# Patient Record
Sex: Male | Born: 1950
Health system: Southern US, Community
[De-identification: ages and names within clinical notes are randomized; demographics above are authoritative.]

## PROBLEM LIST (undated history)

## (undated) DIAGNOSIS — J189 Pneumonia, unspecified organism: Secondary | ICD-10-CM

## (undated) DIAGNOSIS — Z950 Presence of cardiac pacemaker: Secondary | ICD-10-CM

## (undated) DIAGNOSIS — I442 Atrioventricular block, complete: Secondary | ICD-10-CM

## (undated) DIAGNOSIS — Z95810 Presence of automatic (implantable) cardiac defibrillator: Secondary | ICD-10-CM

## (undated) DIAGNOSIS — I2510 Atherosclerotic heart disease of native coronary artery without angina pectoris: Secondary | ICD-10-CM

## (undated) DIAGNOSIS — I10 Essential (primary) hypertension: Secondary | ICD-10-CM

## (undated) DIAGNOSIS — I5022 Chronic systolic (congestive) heart failure: Secondary | ICD-10-CM

## (undated) DIAGNOSIS — E162 Hypoglycemia, unspecified: Secondary | ICD-10-CM

## (undated) DIAGNOSIS — I255 Ischemic cardiomyopathy: Secondary | ICD-10-CM

## (undated) DIAGNOSIS — J45909 Unspecified asthma, uncomplicated: Secondary | ICD-10-CM

## (undated) DIAGNOSIS — H3500 Unspecified background retinopathy: Secondary | ICD-10-CM

## (undated) DIAGNOSIS — E119 Type 2 diabetes mellitus without complications: Secondary | ICD-10-CM

## (undated) HISTORY — PX: KNEE SURGERY: SHX244

## (undated) HISTORY — PX: CATARACT EXTRACTION W/ INTRAOCULAR LENS  IMPLANT, BILATERAL: SHX1307

## (undated) HISTORY — DX: Chronic systolic (congestive) heart failure: I50.22

## (undated) HISTORY — DX: Ischemic cardiomyopathy: I25.5

## (undated) HISTORY — DX: Atrioventricular block, complete: I44.2

## (undated) HISTORY — DX: Atherosclerotic heart disease of native coronary artery without angina pectoris: I25.10

## (undated) HISTORY — DX: Hypoglycemia, unspecified: E16.2

## (undated) HISTORY — DX: Essential (primary) hypertension: I10

## (undated) HISTORY — PX: TONSILLECTOMY: SUR1361

## (undated) HISTORY — DX: Unspecified background retinopathy: H35.00

## (undated) HISTORY — DX: Type 2 diabetes mellitus without complications: E11.9

## (undated) HISTORY — DX: Presence of cardiac pacemaker: Z95.0

## (undated) HISTORY — PX: CARDIAC CATHETERIZATION: SHX172

---

## 1999-07-01 HISTORY — PX: CORONARY ARTERY BYPASS GRAFT: SHX141

## 2010-05-30 HISTORY — PX: INSERT / REPLACE / REMOVE PACEMAKER: SUR710

## 2014-12-07 DIAGNOSIS — G20 Parkinson's disease: Secondary | ICD-10-CM | POA: Insufficient documentation

## 2014-12-07 DIAGNOSIS — J4521 Mild intermittent asthma with (acute) exacerbation: Secondary | ICD-10-CM | POA: Insufficient documentation

## 2015-08-06 DIAGNOSIS — G20 Parkinson's disease: Secondary | ICD-10-CM | POA: Diagnosis not present

## 2015-08-06 DIAGNOSIS — R55 Syncope and collapse: Secondary | ICD-10-CM | POA: Diagnosis not present

## 2015-08-31 DIAGNOSIS — I495 Sick sinus syndrome: Secondary | ICD-10-CM | POA: Diagnosis not present

## 2015-09-03 DIAGNOSIS — E119 Type 2 diabetes mellitus without complications: Secondary | ICD-10-CM | POA: Diagnosis not present

## 2015-09-03 DIAGNOSIS — Z79899 Other long term (current) drug therapy: Secondary | ICD-10-CM | POA: Diagnosis not present

## 2015-09-03 DIAGNOSIS — I10 Essential (primary) hypertension: Secondary | ICD-10-CM | POA: Diagnosis not present

## 2015-10-30 DIAGNOSIS — E103493 Type 1 diabetes mellitus with severe nonproliferative diabetic retinopathy without macular edema, bilateral: Secondary | ICD-10-CM | POA: Diagnosis not present

## 2015-10-30 DIAGNOSIS — H40023 Open angle with borderline findings, high risk, bilateral: Secondary | ICD-10-CM | POA: Diagnosis not present

## 2015-10-30 DIAGNOSIS — H04123 Dry eye syndrome of bilateral lacrimal glands: Secondary | ICD-10-CM | POA: Diagnosis not present

## 2015-11-01 DIAGNOSIS — R55 Syncope and collapse: Secondary | ICD-10-CM | POA: Diagnosis not present

## 2015-11-01 DIAGNOSIS — G20 Parkinson's disease: Secondary | ICD-10-CM | POA: Diagnosis not present

## 2015-11-12 DIAGNOSIS — A09 Infectious gastroenteritis and colitis, unspecified: Secondary | ICD-10-CM | POA: Diagnosis not present

## 2015-11-12 DIAGNOSIS — R197 Diarrhea, unspecified: Secondary | ICD-10-CM | POA: Diagnosis not present

## 2015-12-06 DIAGNOSIS — I2510 Atherosclerotic heart disease of native coronary artery without angina pectoris: Secondary | ICD-10-CM | POA: Diagnosis not present

## 2015-12-06 DIAGNOSIS — E669 Obesity, unspecified: Secondary | ICD-10-CM | POA: Insufficient documentation

## 2015-12-06 DIAGNOSIS — Z79899 Other long term (current) drug therapy: Secondary | ICD-10-CM | POA: Insufficient documentation

## 2015-12-06 DIAGNOSIS — Z7982 Long term (current) use of aspirin: Secondary | ICD-10-CM | POA: Diagnosis not present

## 2015-12-06 DIAGNOSIS — I495 Sick sinus syndrome: Secondary | ICD-10-CM | POA: Diagnosis not present

## 2015-12-24 DIAGNOSIS — Z125 Encounter for screening for malignant neoplasm of prostate: Secondary | ICD-10-CM | POA: Diagnosis not present

## 2015-12-24 DIAGNOSIS — E7800 Pure hypercholesterolemia, unspecified: Secondary | ICD-10-CM | POA: Diagnosis not present

## 2015-12-24 DIAGNOSIS — I10 Essential (primary) hypertension: Secondary | ICD-10-CM | POA: Diagnosis not present

## 2015-12-24 DIAGNOSIS — E119 Type 2 diabetes mellitus without complications: Secondary | ICD-10-CM | POA: Diagnosis not present

## 2015-12-25 DIAGNOSIS — I10 Essential (primary) hypertension: Secondary | ICD-10-CM | POA: Diagnosis not present

## 2015-12-25 DIAGNOSIS — Z125 Encounter for screening for malignant neoplasm of prostate: Secondary | ICD-10-CM | POA: Diagnosis not present

## 2015-12-25 DIAGNOSIS — E119 Type 2 diabetes mellitus without complications: Secondary | ICD-10-CM | POA: Diagnosis not present

## 2015-12-25 DIAGNOSIS — E7800 Pure hypercholesterolemia, unspecified: Secondary | ICD-10-CM | POA: Diagnosis not present

## 2016-01-21 DIAGNOSIS — J029 Acute pharyngitis, unspecified: Secondary | ICD-10-CM | POA: Diagnosis not present

## 2016-01-21 DIAGNOSIS — J3489 Other specified disorders of nose and nasal sinuses: Secondary | ICD-10-CM | POA: Diagnosis not present

## 2016-01-21 DIAGNOSIS — R51 Headache: Secondary | ICD-10-CM | POA: Diagnosis not present

## 2016-01-21 DIAGNOSIS — J018 Other acute sinusitis: Secondary | ICD-10-CM | POA: Diagnosis not present

## 2016-02-04 DIAGNOSIS — R55 Syncope and collapse: Secondary | ICD-10-CM | POA: Diagnosis not present

## 2016-02-04 DIAGNOSIS — G20 Parkinson's disease: Secondary | ICD-10-CM | POA: Diagnosis not present

## 2016-02-18 DIAGNOSIS — E103493 Type 1 diabetes mellitus with severe nonproliferative diabetic retinopathy without macular edema, bilateral: Secondary | ICD-10-CM | POA: Diagnosis not present

## 2016-02-18 DIAGNOSIS — H40023 Open angle with borderline findings, high risk, bilateral: Secondary | ICD-10-CM | POA: Diagnosis not present

## 2016-03-07 DIAGNOSIS — I495 Sick sinus syndrome: Secondary | ICD-10-CM | POA: Diagnosis not present

## 2016-04-04 DIAGNOSIS — E119 Type 2 diabetes mellitus without complications: Secondary | ICD-10-CM | POA: Diagnosis not present

## 2016-04-04 DIAGNOSIS — G20 Parkinson's disease: Secondary | ICD-10-CM | POA: Diagnosis not present

## 2016-04-04 DIAGNOSIS — J45909 Unspecified asthma, uncomplicated: Secondary | ICD-10-CM | POA: Diagnosis not present

## 2016-04-04 DIAGNOSIS — I10 Essential (primary) hypertension: Secondary | ICD-10-CM | POA: Diagnosis not present

## 2016-04-04 DIAGNOSIS — I2510 Atherosclerotic heart disease of native coronary artery without angina pectoris: Secondary | ICD-10-CM | POA: Diagnosis not present

## 2016-04-21 DIAGNOSIS — R062 Wheezing: Secondary | ICD-10-CM | POA: Diagnosis not present

## 2016-04-21 DIAGNOSIS — R05 Cough: Secondary | ICD-10-CM | POA: Diagnosis not present

## 2016-05-05 DIAGNOSIS — R079 Chest pain, unspecified: Secondary | ICD-10-CM | POA: Diagnosis not present

## 2016-05-15 DIAGNOSIS — R079 Chest pain, unspecified: Secondary | ICD-10-CM | POA: Diagnosis not present

## 2016-05-15 DIAGNOSIS — R0989 Other specified symptoms and signs involving the circulatory and respiratory systems: Secondary | ICD-10-CM | POA: Diagnosis not present

## 2016-05-15 DIAGNOSIS — Z950 Presence of cardiac pacemaker: Secondary | ICD-10-CM | POA: Diagnosis not present

## 2016-05-20 DIAGNOSIS — E103413 Type 1 diabetes mellitus with severe nonproliferative diabetic retinopathy with macular edema, bilateral: Secondary | ICD-10-CM | POA: Diagnosis not present

## 2016-06-06 DIAGNOSIS — Z950 Presence of cardiac pacemaker: Secondary | ICD-10-CM | POA: Diagnosis not present

## 2016-06-06 DIAGNOSIS — I442 Atrioventricular block, complete: Secondary | ICD-10-CM | POA: Diagnosis not present

## 2016-06-06 DIAGNOSIS — Z951 Presence of aortocoronary bypass graft: Secondary | ICD-10-CM | POA: Diagnosis not present

## 2016-06-06 DIAGNOSIS — Z7982 Long term (current) use of aspirin: Secondary | ICD-10-CM | POA: Diagnosis not present

## 2016-06-06 DIAGNOSIS — R9430 Abnormal result of cardiovascular function study, unspecified: Secondary | ICD-10-CM | POA: Diagnosis not present

## 2016-08-01 DIAGNOSIS — E119 Type 2 diabetes mellitus without complications: Secondary | ICD-10-CM | POA: Diagnosis not present

## 2016-08-01 DIAGNOSIS — J45909 Unspecified asthma, uncomplicated: Secondary | ICD-10-CM | POA: Diagnosis not present

## 2016-08-01 DIAGNOSIS — I10 Essential (primary) hypertension: Secondary | ICD-10-CM | POA: Diagnosis not present

## 2016-08-01 DIAGNOSIS — G20 Parkinson's disease: Secondary | ICD-10-CM | POA: Diagnosis not present

## 2016-08-01 DIAGNOSIS — I2510 Atherosclerotic heart disease of native coronary artery without angina pectoris: Secondary | ICD-10-CM | POA: Diagnosis not present

## 2016-08-01 DIAGNOSIS — I5022 Chronic systolic (congestive) heart failure: Secondary | ICD-10-CM | POA: Diagnosis not present

## 2016-08-01 DIAGNOSIS — Z6831 Body mass index (BMI) 31.0-31.9, adult: Secondary | ICD-10-CM | POA: Diagnosis not present

## 2016-08-15 ENCOUNTER — Encounter: Payer: Self-pay | Admitting: Cardiology

## 2016-08-26 ENCOUNTER — Ambulatory Visit: Payer: Self-pay | Admitting: Cardiology

## 2016-09-08 DIAGNOSIS — G20 Parkinson's disease: Secondary | ICD-10-CM | POA: Diagnosis not present

## 2016-09-08 DIAGNOSIS — G250 Essential tremor: Secondary | ICD-10-CM | POA: Diagnosis not present

## 2016-09-09 DIAGNOSIS — I442 Atrioventricular block, complete: Secondary | ICD-10-CM | POA: Diagnosis not present

## 2016-09-12 ENCOUNTER — Ambulatory Visit (INDEPENDENT_AMBULATORY_CARE_PROVIDER_SITE_OTHER): Payer: Medicare Other | Admitting: Cardiovascular Disease

## 2016-09-12 ENCOUNTER — Encounter: Payer: Self-pay | Admitting: Cardiovascular Disease

## 2016-09-12 ENCOUNTER — Encounter (INDEPENDENT_AMBULATORY_CARE_PROVIDER_SITE_OTHER): Payer: Self-pay

## 2016-09-12 VITALS — BP 120/72 | HR 77 | Ht 68.5 in | Wt 209.0 lb

## 2016-09-12 DIAGNOSIS — Z951 Presence of aortocoronary bypass graft: Secondary | ICD-10-CM | POA: Diagnosis not present

## 2016-09-12 DIAGNOSIS — I2510 Atherosclerotic heart disease of native coronary artery without angina pectoris: Secondary | ICD-10-CM

## 2016-09-12 DIAGNOSIS — E782 Mixed hyperlipidemia: Secondary | ICD-10-CM | POA: Diagnosis not present

## 2016-09-12 HISTORY — DX: Atherosclerotic heart disease of native coronary artery without angina pectoris: I25.10

## 2016-09-12 MED ORDER — ATORVASTATIN CALCIUM 40 MG PO TABS: 40.0000 mg | ORAL_TABLET | Freq: Every day | ORAL | 3 refills | Status: DC

## 2016-09-12 NOTE — Progress Notes (Signed)
Cardiology Office Note   Date:  09/12/2016   ID:  Jeffrey Parsons, DOB Feb 12, 1951, MRN [id number]  PCP:  No PCP Per Patient  Cardiologist:   Kristeen Miss, MD   Chief Complaint  Patient presents with  . Coronary Artery Disease   Problem lis 1. CAD - CABG atg 41 at [facility] ( 2002)  2. Diabetes 3. Pacer  4. Hyperlipidemia  5. Parkinsons disease 6. Asthma    History of Present Illness: Jeffrey Parsons is a 66 y.o. male who presents for further evaluation of his CAD CABG in 2001 at [place]  - age 35   Pacer in 2011 Recently moved to [place].  estabilshing care here   No angina .  Operated a [employer] for 40 years.  Now retired.  Exercises regularly     Past Medical History:  Diagnosis Date  . Hypertension   . Hypoglycemia   . Retinopathy   . Type 2 diabetes mellitus (HCC)     Past Surgical History:  Procedure Laterality Date  . 5 WAY HEART BYPASS  2001  . KNEE SURGERY Left    AS A CHILD  . PACEMAKER IMPLANT  05/2010     Current Outpatient Prescriptions  Medication Sig Dispense Refill  . albuterol (PROAIR HFA) 108 (90 Base) MCG/ACT inhaler Inhale 2 puffs into the lungs every 6 (six) hours as needed for wheezing or shortness of breath.    [name] aspirin 325 MG tablet Take 325 mg by mouth daily.    . carbidopa-levodopa (SINEMET CR) 50-200 MG tablet Take 1 tablet by mouth at bedtime.    . carbidopa-levodopa (SINEMET IR) 25-100 MG tablet Take 2 tablets by mouth 3 (three) times daily.    . insulin aspart (NOVOLOG FLEXPEN) 100 UNIT/ML FlexPen Inject into the skin 3 (three) times daily with meals.    . insulin glargine (LANTUS) 100 UNIT/ML injection Inject 45 Units into the skin at bedtime.    . Insulin Pen Needle (PEN NEEDLES 5/16") 30G X 8 MM MISC by Does not apply route.    [name] losartan-hydrochlorothiazide (HYZAAR) 100-12.5 MG tablet Take 1 tablet by mouth every other day.     . metoprolol (LOPRESSOR) 50 MG tablet Take 50 mg by mouth 2 (two) times daily.    .  Multiple Vitamin (MULTIVITAMIN) tablet Take 1 tablet by mouth daily.    . vitamin B-12 (CYANOCOBALAMIN) 1000 MCG tablet Take 1,000 mcg by mouth daily.     No current facility-administered medications for this visit.     PAD Screen 09/12/2016  Previous PAD dx? No  Previous surgical procedure? No  Pain with walking? No  Feet/toe relief with dangling? No  Painful, non-healing ulcers? No  Extremities discolored? No      Allergies:   Augmentin [amoxicillin-pot clavulanate] and Sulfa antibiotics    Social History:  The patient  reports that he has never smoked. He has never used smokeless tobacco. He reports that he does not drink alcohol or use drugs.   Family History:  The patient's family history includes Bronchitis in his mother; Coronary artery disease in his father; Heart disease in his father; Hypertension in his brother and sister.    ROS:  Please see the history of present illness.    Review of Systems: Constitutional:  denies fever, chills, diaphoresis, appetite change and fatigue.  HEENT: denies photophobia, eye pain, redness, hearing loss, ear pain, congestion, sore throat, rhinorrhea, sneezing, neck pain, neck stiffness and tinnitus.  Respiratory: denies SOB, DOE, cough,  chest tightness, and wheezing.  Cardiovascular: denies chest pain, palpitations and leg swelling.  Gastrointestinal: denies nausea, vomiting, abdominal pain, diarrhea, constipation, blood in stool.  Genitourinary: denies dysuria, urgency, frequency, hematuria, flank pain and difficulty urinating.  Musculoskeletal: denies  myalgias, back pain, joint swelling, arthralgias and gait problem.   Skin: denies pallor, rash and wound.  Neurological: denies dizziness, seizures, syncope, weakness, light-headedness, numbness and headaches.   Hematological: denies adenopathy, easy bruising, personal or family bleeding history.  Psychiatric/ Behavioral: denies suicidal ideation, mood changes, confusion, nervousness,  sleep disturbance and agitation.       All other systems are reviewed and negative.    PHYSICAL EXAM: VS:  BP 120/72 (BP Location: Left Arm, Patient Position: Sitting, Cuff Size: Normal)   Pulse 77   Ht 5' 8.5" (1.74 m)   Wt 209 lb (94.8 kg)   SpO2 93%   BMI 31.32 kg/m  , BMI Body mass index is 31.32 kg/m. GEN: Well nourished, well developed, in no acute distress  HEENT: normal  Neck: no JVD, carotid bruits, or masses Cardiac: RRR; no murmurs, rubs, or gallops,no edema  Respiratory:  clear to auscultation bilaterally, normal work of breathing GI: soft, nontender, nondistended, + BS MS: no deformity or atrophy  Skin: warm and dry, no rash Neuro:  Strength and sensation are intact Psych: normal   EKG:  EKG is ordered today. The ekg ordered today demonstrates AV sequential pacing at 77 bpm.   Recent Labs: No results found for requested labs within last 8760 hours.    Lipid Panel No results found for: CHOL, TRIG, HDL, CHOLHDL, VLDL, LDLCALC, LDLDIRECT    Wt Readings from Last 3 Encounters:  09/12/16 209 lb (94.8 kg)      Other studies Reviewed: Additional studies/ records that were reviewed today include: . Review of the above records demonstrates:    ASSESSMENT AND PLAN:  1.  1. Coronary artery disease: The patient has a history of coronary artery disease and is status post coronary artery bypass grafting in 2002. He is done very well. He's not had any episodes of chest pain or shortness of breath.  He had an echocardiogram recently and was not told of any specific abdomen allergies but told that his left ventricular contractility pattern was consistent with ventricle pacing.  We'll continue the same medications for now.  2. Hyperlipidemia: The patient is been on simvastatin. His LDL was 117. We'll discontinue the simvastatin and start him on atorvastatin 40 mg a day. We'll check fasting labs in 3 months.  3. Essential hypertension: Continue current start HCTZ  and metoprolol. We had some discussion about starting Toprol-XL but I do not think that there is any advantage to that at this time. He has a large supply of the Toprol tartrate at present.  I'll see him in 6 months.    Kristeen MissPhilip Sesilia Poucher, MD  09/12/2016 4:14 PM    [facility] [address], [place], [place]  [address] Phone: [phone]([phone]; Fax: [phone]

## 2016-09-12 NOTE — Patient Instructions (Signed)
Medication Instructions:  STOP Simvastatin (Zocor) START Atorvastatin (Lipitor) 40 mg once daily   Labwork: Your physician recommends that you return for lab work in: 3 months  You will need to FAST for this appointment - nothing to eat or drink after midnight the night before except water.   Testing/Procedures: None Ordered   Follow-Up: Your physician wants you to follow-up in: 6 months with Dr. Elease HashimotoNahser.  You will receive a reminder letter in the mail two months in advance. If you don't receive a letter, please call our office to schedule the follow-up appointment.   If you need a refill on your cardiac medications before your next appointment, please call your pharmacy.   Thank you for choosing [facility]! Eligha BridegroomMichelle Alicya Bena, RN [phone]

## 2016-11-04 DIAGNOSIS — I10 Essential (primary) hypertension: Secondary | ICD-10-CM | POA: Diagnosis not present

## 2016-11-04 DIAGNOSIS — E118 Type 2 diabetes mellitus with unspecified complications: Secondary | ICD-10-CM | POA: Diagnosis not present

## 2016-11-04 DIAGNOSIS — G20 Parkinson's disease: Secondary | ICD-10-CM | POA: Diagnosis not present

## 2016-11-04 DIAGNOSIS — E782 Mixed hyperlipidemia: Secondary | ICD-10-CM | POA: Diagnosis not present

## 2016-11-17 DIAGNOSIS — G20 Parkinson's disease: Secondary | ICD-10-CM | POA: Diagnosis not present

## 2016-12-04 DIAGNOSIS — I495 Sick sinus syndrome: Secondary | ICD-10-CM | POA: Diagnosis not present

## 2016-12-04 DIAGNOSIS — Z95810 Presence of automatic (implantable) cardiac defibrillator: Secondary | ICD-10-CM | POA: Insufficient documentation

## 2016-12-04 DIAGNOSIS — Z950 Presence of cardiac pacemaker: Secondary | ICD-10-CM | POA: Diagnosis not present

## 2016-12-04 DIAGNOSIS — Z79899 Other long term (current) drug therapy: Secondary | ICD-10-CM | POA: Diagnosis not present

## 2016-12-04 DIAGNOSIS — Z7982 Long term (current) use of aspirin: Secondary | ICD-10-CM | POA: Diagnosis not present

## 2016-12-04 DIAGNOSIS — Z882 Allergy status to sulfonamides status: Secondary | ICD-10-CM | POA: Insufficient documentation

## 2016-12-04 DIAGNOSIS — I10 Essential (primary) hypertension: Secondary | ICD-10-CM | POA: Diagnosis not present

## 2016-12-04 HISTORY — DX: Allergy status to sulfonamides: Z88.2

## 2016-12-16 ENCOUNTER — Other Ambulatory Visit: Payer: Medicare Other | Admitting: *Deleted

## 2016-12-16 DIAGNOSIS — E782 Mixed hyperlipidemia: Secondary | ICD-10-CM | POA: Diagnosis not present

## 2016-12-16 DIAGNOSIS — I2510 Atherosclerotic heart disease of native coronary artery without angina pectoris: Secondary | ICD-10-CM

## 2016-12-16 DIAGNOSIS — Z951 Presence of aortocoronary bypass graft: Secondary | ICD-10-CM | POA: Diagnosis not present

## 2016-12-16 LAB — LIPID PANEL
Chol/HDL Ratio: 2.4 ratio (ref 0.0–5.0)
Cholesterol, Total: 115 mg/dL (ref 100–199)
HDL: 48 mg/dL (ref >39)
LDL CALC: 52 mg/dL (ref 0–99)
Triglycerides: 74 mg/dL (ref 0–149)
VLDL CHOLESTEROL CAL: 15 mg/dL (ref 5–40)

## 2016-12-16 LAB — COMPREHENSIVE METABOLIC PANEL
ALBUMIN: 4.1 g/dL (ref 3.6–4.8)
ALK PHOS: 128 IU/L — AB (ref 39–117)
ALT: 14 IU/L (ref 0–44)
AST: 20 IU/L (ref 0–40)
Albumin/Globulin Ratio: 1.7 (ref 1.2–2.2)
BILIRUBIN TOTAL: 0.8 mg/dL (ref 0.0–1.2)
BUN / CREAT RATIO: 24 (ref 10–24)
BUN: 20 mg/dL (ref 8–27)
CO2: 23 mmol/L (ref 20–29)
CREATININE: 0.83 mg/dL (ref 0.76–1.27)
Calcium: 8.9 mg/dL (ref 8.6–10.2)
Chloride: 99 mmol/L (ref 96–106)
GFR calc non Af Amer: 92 mL/min/{1.73_m2} (ref >59)
GFR, EST AFRICAN AMERICAN: 106 mL/min/{1.73_m2} (ref >59)
GLOBULIN, TOTAL: 2.4 g/dL (ref 1.5–4.5)
GLUCOSE: 140 mg/dL — AB (ref 65–99)
Potassium: 4.4 mmol/L (ref 3.5–5.2)
SODIUM: 138 mmol/L (ref 134–144)
TOTAL PROTEIN: 6.5 g/dL (ref 6.0–8.5)

## 2017-03-06 DIAGNOSIS — I495 Sick sinus syndrome: Secondary | ICD-10-CM | POA: Diagnosis not present

## 2017-04-23 DIAGNOSIS — J4520 Mild intermittent asthma, uncomplicated: Secondary | ICD-10-CM | POA: Diagnosis not present

## 2017-04-23 DIAGNOSIS — E119 Type 2 diabetes mellitus without complications: Secondary | ICD-10-CM | POA: Diagnosis not present

## 2017-04-23 DIAGNOSIS — G20 Parkinson's disease: Secondary | ICD-10-CM | POA: Diagnosis not present

## 2017-04-23 DIAGNOSIS — E782 Mixed hyperlipidemia: Secondary | ICD-10-CM | POA: Diagnosis not present

## 2017-04-23 DIAGNOSIS — I2510 Atherosclerotic heart disease of native coronary artery without angina pectoris: Secondary | ICD-10-CM | POA: Diagnosis not present

## 2017-04-28 DIAGNOSIS — E782 Mixed hyperlipidemia: Secondary | ICD-10-CM | POA: Diagnosis not present

## 2017-04-28 DIAGNOSIS — E119 Type 2 diabetes mellitus without complications: Secondary | ICD-10-CM | POA: Diagnosis not present

## 2017-05-07 ENCOUNTER — Encounter: Payer: Self-pay | Admitting: Cardiovascular Disease

## 2017-05-07 ENCOUNTER — Ambulatory Visit (INDEPENDENT_AMBULATORY_CARE_PROVIDER_SITE_OTHER): Payer: Medicare Other | Admitting: Cardiovascular Disease

## 2017-05-07 VITALS — BP 116/52 | HR 86 | Ht 68.5 in | Wt 209.4 lb

## 2017-05-07 DIAGNOSIS — E782 Mixed hyperlipidemia: Secondary | ICD-10-CM | POA: Diagnosis not present

## 2017-05-07 DIAGNOSIS — Z950 Presence of cardiac pacemaker: Secondary | ICD-10-CM | POA: Diagnosis not present

## 2017-05-07 DIAGNOSIS — I2510 Atherosclerotic heart disease of native coronary artery without angina pectoris: Secondary | ICD-10-CM | POA: Diagnosis not present

## 2017-05-07 MED ORDER — ASPIRIN EC 81 MG PO TBEC: 81.0000 mg | DELAYED_RELEASE_TABLET | Freq: Every day | ORAL | Status: DC

## 2017-05-07 NOTE — Progress Notes (Signed)
Cardiology Office Note   Date:  05/07/2017   ID:  Jeffrey DarnerRussell Corrales, DOB 06/03/1951, MRN [id number]  PCP:  Abigail MiyamotoPerry, [name], MD  Cardiologist:   Kristeen MissPhilip Robyne Matar, MD   Chief Complaint  Patient presents with  . Coronary Artery Disease   Problem lis 1. CAD - [facility]g 5049 at [facility] ( [facility])  2. Diabetes 3. Pacer  4. Hyperlipidemia  5. Parkinsons disease 6. Asthma    History of Present Illness: Jeffrey Parsons is a 66 y.o. male who presents for further evaluation of his CAD CABG in 2001 at [facility]  - age 66   Pacer in 2011 Recently moved to [place].  estabilshing care here   No angina .  Operated a [employer] for 40 years.  Now retired.  Exercises regularly   Nov. 82018:  Doing well.   No Cp or dyspea,  No cough or cold symptoms Goes to  the gym 5 days a week  On his own, he  Decreased the atorvastatin to 20   Past Medical History:  Diagnosis Date  . Hypertension   . Hypoglycemia   . Retinopathy   . Type 2 diabetes mellitus (HCC)     Past Surgical History:  Procedure Laterality Date  . 5 WAY HEART BYPASS  2001  . KNEE SURGERY Left    AS A CHILD  . PACEMAKER IMPLANT  05/2010     Current Outpatient Medications  Medication Sig Dispense Refill  . albuterol (PROAIR HFA) 108 (90 Base) MCG/ACT inhaler Inhale 2 puffs into the lungs every 6 (six) hours as needed for wheezing or shortness of breath.    [name]. aspirin 325 MG tablet Take 325 mg by mouth daily.    [name]. atorvastatin (LIPITOR) 40 MG tablet Take 1 tablet (40 mg total) by mouth daily. 90 tablet 3  . carbidopa-levodopa (SINEMET CR) 50-200 MG tablet Take 1 tablet by mouth at bedtime.    . carbidopa-levodopa (SINEMET IR) 25-100 MG tablet Take 2 tablets by mouth 3 (three) times daily.    . insulin aspart (NOVOLOG FLEXPEN) 100 UNIT/ML FlexPen Inject into the skin 3 (three) times daily with meals.    . insulin glargine (LANTUS) 100 UNIT/ML injection Inject 45 Units into the skin at bedtime.    . Insulin Pen  Needle (PEN NEEDLES 5/16") 30G X 8 MM MISC by Does not apply route.    [name]. losartan-hydrochlorothiazide (HYZAAR) 100-12.5 MG tablet Take 1 tablet by mouth every other day.     . metoprolol (LOPRESSOR) 50 MG tablet Take 50 mg by mouth 2 (two) times daily.    . Multiple Vitamin (MULTIVITAMIN) tablet Take 1 tablet by mouth daily.    . vitamin B-12 (CYANOCOBALAMIN) 1000 MCG tablet Take 1,000 mcg by mouth daily.     No current facility-administered medications for this visit.     PAD Screen 09/12/2016  Previous PAD dx? No  Previous surgical procedure? No  Pain with walking? No  Feet/toe relief with dangling? No  Painful, non-healing ulcers? No  Extremities discolored? No      Allergies:   Augmentin [amoxicillin-pot clavulanate] and Sulfa antibiotics    Social History:  The patient  reports that  has never smoked. he has never used smokeless tobacco. He reports that he does not drink alcohol or use drugs.   Family History:  The patient's family history includes Bronchitis in his mother; Coronary artery disease in his father; Heart disease in his father; Hypertension in his brother and sister.  ROS:  Please see the history of present illness.     All other systems are reviewed and negative.   Physical Exam: Blood pressure (!) 116/52, pulse 86, height 5' 8.5" (1.74 m), weight 209 lb 6.4 oz (95 kg), SpO2 96 %.  GEN:  Well nourished, well developed in no acute distress HEENT: Normal NECK: No JVD; No carotid bruits LYMPHATICS: No lymphadenopathy CARDIAC: RRR , no murmurs, rubs, gallops RESPIRATORY:  Clear to auscultation without rales, wheezing or rhonchi  ABDOMEN: Soft, non-tender, non-distended MUSCULOSKELETAL:  No edema; No deformity  SKIN: Warm and dry NEUROLOGIC:  Alert and oriented x 3    EKG:  EKG is ordered today.    Recent Labs: 12/16/2016: ALT 14; BUN 20; Creatinine, Ser 0.83; Potassium 4.4; Sodium 138    Lipid Panel    Component Value Date/Time   CHOL 115  12/16/2016 0924   TRIG 74 12/16/2016 0924   HDL 48 12/16/2016 0924   CHOLHDL 2.4 12/16/2016 0924   LDLCALC 52 12/16/2016 0924      Wt Readings from Last 3 Encounters:  05/07/17 209 lb 6.4 oz (95 kg)  09/12/16 209 lb (94.8 kg)      Other studies Reviewed: Additional studies/ records that were reviewed today include: . Review of the above records demonstrates:    ASSESSMENT AND PLAN:  1.  1. Coronary artery disease:  Stable   2. Hyperlipidemia: had labs at his primary MD   3. Essential hypertension: BP is well controlled.   Continue meds  Will have his primary MD fax his labs to us   4.  Sick sinus syndrome  - s/p  Pacer - has a Medtronic pacer.   Was last interrogated in [place], [place] in Sept.    Now lives in [place] Needs to establish with EP   I'll see him in 6 months.    Kristeen MissPhilip Tailey Top, MD  05/07/2017 1:24 PM    [facility] [address], [place], [place]  [address] Phone: [phone](336) [phone]; Fax: [phone]

## 2017-05-07 NOTE — Patient Instructions (Signed)
Medication Instructions:  DECREASE Aspirin to 81 mg once daily   Labwork: None Ordered   Testing/Procedures: None Ordered   Follow-Up: You have been referred to [facility]/Patient has pac[place] previously [place] in [place], [place]  Your physician wants you to follow-up in: 6 months with Dr. Elease HashimotoNahser.  You will receive a reminder letter in the mail two months in advance. If you don't receive a letter, please call our office to schedule the follow-up appointment.   If you need a refill on your cardiac medications before your next appointment, please call your pharmacy.   Thank you for choosing [facility]! Eligha BridegroomMichelle Regena Delucchi, RN [phone]

## 2017-05-08 ENCOUNTER — Encounter: Payer: Medicare Other | Admitting: Internal Medicine

## 2017-05-26 ENCOUNTER — Ambulatory Visit (INDEPENDENT_AMBULATORY_CARE_PROVIDER_SITE_OTHER): Payer: Medicare Other | Admitting: Internal Medicine

## 2017-05-26 ENCOUNTER — Encounter: Payer: Self-pay | Admitting: Internal Medicine

## 2017-05-26 VITALS — BP 124/70 | HR 92 | Ht 68.5 in | Wt 205.8 lb

## 2017-05-26 DIAGNOSIS — I2510 Atherosclerotic heart disease of native coronary artery without angina pectoris: Secondary | ICD-10-CM

## 2017-05-26 DIAGNOSIS — Z950 Presence of cardiac pacemaker: Secondary | ICD-10-CM

## 2017-05-26 DIAGNOSIS — Z951 Presence of aortocoronary bypass graft: Secondary | ICD-10-CM | POA: Diagnosis not present

## 2017-05-26 DIAGNOSIS — I5042 Chronic combined systolic (congestive) and diastolic (congestive) heart failure: Secondary | ICD-10-CM | POA: Insufficient documentation

## 2017-05-26 DIAGNOSIS — I255 Ischemic cardiomyopathy: Secondary | ICD-10-CM | POA: Diagnosis not present

## 2017-05-26 DIAGNOSIS — I442 Atrioventricular block, complete: Secondary | ICD-10-CM | POA: Diagnosis not present

## 2017-05-26 DIAGNOSIS — Z959 Presence of cardiac and vascular implant and graft, unspecified: Secondary | ICD-10-CM | POA: Insufficient documentation

## 2017-05-26 DIAGNOSIS — I5022 Chronic systolic (congestive) heart failure: Secondary | ICD-10-CM | POA: Diagnosis not present

## 2017-05-26 HISTORY — DX: Ischemic cardiomyopathy: I25.5

## 2017-05-26 HISTORY — DX: Atrioventricular block, complete: I44.2

## 2017-05-26 LAB — CUP PACEART INCLINIC DEVICE CHECK
Battery Impedance: 1740 Ohm
Brady Statistic AP VS Percent: 0 %
Brady Statistic AS VS Percent: 1 %
Date Time Interrogation Session: 20181127172035
Implantable Lead Implant Date: 20111213
Implantable Lead Location: 753859
Implantable Lead Model: 5076
Lead Channel Impedance Value: 573 Ohm
Lead Channel Impedance Value: 663 Ohm
Lead Channel Pacing Threshold Pulse Width: 0.4 ms
Lead Channel Pacing Threshold Pulse Width: 0.4 ms
Lead Channel Setting Pacing Amplitude: 2 V
Lead Channel Setting Pacing Amplitude: 2.5 V
Lead Channel Setting Sensing Sensitivity: 2.8 mV
MDC IDC LEAD IMPLANT DT: 20111213
MDC IDC LEAD LOCATION: 753860
MDC IDC MSMT BATTERY REMAINING LONGEVITY: 37 mo
MDC IDC MSMT BATTERY VOLTAGE: 2.76 V
MDC IDC MSMT LEADCHNL RA PACING THRESHOLD AMPLITUDE: 0.75 V
MDC IDC MSMT LEADCHNL RA SENSING INTR AMPL: 4 mV
MDC IDC MSMT LEADCHNL RV PACING THRESHOLD AMPLITUDE: 0.75 V
MDC IDC PG IMPLANT DT: 20111213
MDC IDC SET LEADCHNL RV PACING PULSEWIDTH: 0.4 ms
MDC IDC STAT BRADY AP VP PERCENT: 34 %
MDC IDC STAT BRADY AS VP PERCENT: 65 %

## 2017-05-26 MED ORDER — CARVEDILOL 12.5 MG PO TABS: 12.5000 mg | ORAL_TABLET | Freq: Two times a day (BID) | ORAL | 3 refills | Status: DC

## 2017-05-26 NOTE — Progress Notes (Signed)
ELECTROPHYSIOLOGY CONSULT NOTE  Patient ID: Jeffrey Parsons, MRN: [id number], DOB/AGE: [birth year] 66 y.o. Admit date: (Not on file) Date of Consult: 05/26/2017  Primary Physician: Abigail MiyamotoPerry, [name], MD Primary Cardiologist: Marliss CzarPNa     Jeffrey Parsons is a 66 y.o. male who is being seen today for the evaluation of ICD  at the request of PNa.    HPI Jeffrey Parsons is a 66 y.o. male  Seen to establish pacemaker care.  Medtronic Device was implanted in 2011 for syncope.[name].  He now has complete heart block.  He has been noted by his previous cardiologist (records reviewed in Care Everywhere) to have LV dysfunction (See Below)  and discussions were initiated as to ICD upgrade.  He also has a history of coronary artery disease and underwent bypass surgery in June 2002.  He has exertional intolerance manifested both by shortness of breath as well as a recurring nonradiating neck discomfort that is relieved by rest.  This is been progressive.  He has some peripheral edema.  He has not had syncope since the initial pacemaker implantation   DATE TEST    11/17 Myoview   EF 19 % No ischemia/prior infarcts  11/17 Echo   EF 25 %           Date Cr K  6/18 0.83 4.4        Is also developed Parkinson's disease  Past Medical History:  Diagnosis Date  . Complete heart block (HCC) 05/26/2017  . Congestive heart failure, NYHA class 3, chronic, systolic (HCC) 05/26/2017  . Coronary artery disease involving native coronary artery of native heart without angina pectoris 09/12/2016  . Hypertension   . Hypoglycemia   . Ischemic cardiomyopathy 05/26/2017  . Pacemaker 05/26/2017  . Retinopathy   . Type 2 diabetes mellitus (HCC)       Surgical History:  Past Surgical History:  Procedure Laterality Date  . 5 WAY HEART BYPASS  2001  . KNEE SURGERY Left    AS A CHILD  . PACEMAKER IMPLANT  05/2010     Home Meds: Prior to Admission medications   Medication Sig Start Date End Date  Taking? Authorizing Provider  albuterol (PROAIR HFA) 108 (90 Base) MCG/ACT inhaler Inhale 2 puffs into the lungs every 6 (six) hours as needed for wheezing or shortness of breath.   Yes [provider]  aspirin EC 81 MG tablet Take 1 tablet (81 mg total) daily by mouth. 05/07/17  Yes Nahser, Deloris PingPhilip J, MD  atorvastatin (LIPITOR) 40 MG tablet Take 20 mg by mouth daily.    Yes [provider]  carbidopa-levodopa (SINEMET CR) 50-200 MG tablet Take 1 tablet by mouth every morning.    Yes [provider]  carbidopa-levodopa (SINEMET IR) 25-100 MG tablet Take 2 tablets by mouth 3 (three) times daily.   Yes [provider]  insulin aspart (NOVOLOG FLEXPEN) 100 UNIT/ML FlexPen Inject into the skin 3 (three) times daily with meals.   Yes [provider]  insulin glargine (LANTUS) 100 UNIT/ML injection Inject 45 Units into the skin at bedtime.   Yes [provider]  Insulin Pen Needle (PEN NEEDLES 5/16") 30G X 8 MM MISC by Does not apply route.   Yes [provider]  losartan-hydrochlorothiazide (HYZAAR) 100-12.5 MG tablet Take 1 tablet by mouth daily.    Yes [provider]  metoprolol (LOPRESSOR) 50 MG tablet Take 50 mg by mouth 2 (two) times daily.   Yes [provider]  Multiple Vitamin (MULTIVITAMIN) tablet Take 1 tablet by mouth daily.   Yes [provider]  vitamin B-12 (CYANOCOBALAMIN) 1000 MCG tablet Take 1,000 mcg by mouth daily.   Yes [provider]    Allergies:  Allergies  Allergen Reactions  . Augmentin [Amoxicillin-Pot Clavulanate]   . Sulfa Antibiotics Other (See Comments)    FLUSH Flushing Other reaction(s): Other (See Comments) Flushing     Social History   Socioeconomic History  . Marital status: Married    Spouse name: Not on file  . Number of children: Not on file  . Years of education: Not on file  . Highest education level: Not on file  Social Needs  . Financial resource  strain: Not on file  . Food insecurity - worry: Not on file  . Food insecurity - inability: Not on file  . Transportation needs - medical: Not on file  . Transportation needs - non-medical: Not on file  Occupational History  . Not on file  Tobacco Use  . Smoking status: Never Smoker  . Smokeless tobacco: Never Used  Substance and Sexual Activity  . Alcohol use: No  . Drug use: No  . Sexual activity: Yes  Other Topics Concern  . Not on file  Social History Narrative  . Not on file     Family History  Problem Relation Age of Onset  . Bronchitis Mother   . Coronary artery disease Father   . Heart disease Father   . Hypertension Sister   . Hypertension Brother      ROS:  Please see the history of present illness.     All other systems reviewed and negative.    Physical Exam: Blood pressure 124/70, pulse 92, height 5' 8.5" (1.74 m), weight 205 lb 12.8 oz (93.4 kg), SpO2 96 %. General: Well developed, well nourished male in no acute distress. Head: Normocephalic, atraumatic, sclera non-icteric, no xanthomas, nares are without discharge. EENT: normal  Lymph Nodes:  none Neck: Negative for carotid bruits. JVD not elevated. Back:without scoliosis kyphosis  Lungs: Clear bilaterally to auscultation without wheezes, rales, or rhonchi. Breathing is unlabored. Heart: RRR with S1 S2. No murmur . No rubs, or gallops appreciated. Abdomen: Soft, non-tender, non-distended with normoactive bowel sounds. No hepatomegaly. No rebound/guarding. No obvious abdominal masses. Msk:  Strength and tone appear normal for age. Extremities: No clubbing or cyanosis. No edema.  Distal pedal pulses are 2+ and equal bilaterally. Skin: Warm and Dry Neuro: Alert and oriented X 3. CN III-XII intact Grossly normal sensory and motor function . Psych:  Responds to questions appropriately with a normal affect.      Labs: Cardiac Enzymes No results for input(s): CKTOTAL, CKMB, TROPONINI in the last 72  hours. CBC No results found for: WBC, HGB, HCT, MCV, PLT PROTIME: No results for input(s): LABPROT, INR in the last 72 hours. Chemistry No results for input(s): NA, K, CL, CO2, BUN, CREATININE, CALCIUM, PROT, BILITOT, ALKPHOS, ALT, AST, GLUCOSE in the last 168 hours.  Invalid input(s): LABALBU Lipids Lab Results  Component Value Date   CHOL 115 12/16/2016   HDL 48 12/16/2016   LDLCALC 52 12/16/2016   TRIG 74 12/16/2016   BNP No results found for: PROBNP Thyroid Function Tests: No results for input(s): TSH, T4TOTAL, T3FREE, THYROIDAB in the last 72 hours.  Invalid input(s): FREET3 Miscellaneous No results found for: DDIMER  Radiology/Studies:  No results found.  EKG: Sinus rhythm with P synchronous pacing Intervals 16/16/44  Assessment and Plan:  Cardiomyopathy question mechanism  Ischemic heart disease with prior bypass surgery  Exertional neck discomfort question anginal equivalent  Congestive heart failure-chronic-systolic-class IIb-III 8  Complete heart block  Pacemaker-Medtronic   The patient has a cardiomyopathy with potential contributors being progressive coronary disease as suggested by the exertional neck discomfort, pacemaker.  It begs a revision of medical therapy--first we will change from Lopressor--carvedilol based on the comet data After this initiation, I would begin him on Aldactone as the incremental benefit is greater than it is with switching Cozaar to [employer].  Once he is on these 2 medications I would switch him to [facility].  Decisions regarding device revision with either CRT or ICD upgrade may be deferred until he is maximally medically treated.  This would include catheterization and possible revascularization.  I suspect that he has various contributors to his cardiomyopathy.  He also has congestive heart failure.  He is euvolemic so we will start with the medication adjustments as noted above.   After the aforementioned discussion he  describes himself as "shell shocked "he and his wife would like to go home and consider options regarding further testing.    We will wait to hear from him.  He has given us permission to be in touch with him after 3 weeks      Sherryl MangesSteven Roper Tolson

## 2017-05-26 NOTE — H&P (View-Only) (Signed)
ELECTROPHYSIOLOGY CONSULT NOTE  Patient ID: Jeffrey Parsons, MRN: [id number], DOB/AGE: [birth year] 66 y.o. Admit date: (Not on file) Date of Consult: 05/26/2017  Primary Physician: Abigail MiyamotoPerry, [name], MD Primary Cardiologist: Marliss CzarPNa     Jeffrey Parsons is a 66 y.o. male who is being seen today for the evaluation of ICD  at the request of PNa.    HPI Jeffrey DarnerRussell Harland is a 66 y.o. male  Seen to establish pacemaker care.  Medtronic Device was implanted in 2011 for syncope.[name].  He now has complete heart block.  He has been noted by his previous cardiologist (records reviewed in Care Everywhere) to have LV dysfunction (See Below)  and discussions were initiated as to ICD upgrade.  He also has a history of coronary artery disease and underwent bypass surgery in June 2002.  He has exertional intolerance manifested both by shortness of breath as well as a recurring nonradiating neck discomfort that is relieved by rest.  This is been progressive.  He has some peripheral edema.  He has not had syncope since the initial pacemaker implantation   DATE TEST    11/17 Myoview   EF 19 % No ischemia/prior infarcts  11/17 Echo   EF 25 %           Date Cr K  6/18 0.83 4.4        Is also developed Parkinson's disease  Past Medical History:  Diagnosis Date  . Complete heart block (HCC) 05/26/2017  . Congestive heart failure, NYHA class 3, chronic, systolic (HCC) 05/26/2017  . Coronary artery disease involving native coronary artery of native heart without angina pectoris 09/12/2016  . Hypertension   . Hypoglycemia   . Ischemic cardiomyopathy 05/26/2017  . Pacemaker 05/26/2017  . Retinopathy   . Type 2 diabetes mellitus (HCC)       Surgical History:  Past Surgical History:  Procedure Laterality Date  . 5 WAY HEART BYPASS  2001  . KNEE SURGERY Left    AS A CHILD  . PACEMAKER IMPLANT  05/2010     Home Meds: Prior to Admission medications   Medication Sig Start Date End Date  Taking? Authorizing Provider  albuterol (PROAIR HFA) 108 (90 Base) MCG/ACT inhaler Inhale 2 puffs into the lungs every 6 (six) hours as needed for wheezing or shortness of breath.   Yes [provider]  aspirin EC 81 MG tablet Take 1 tablet (81 mg total) daily by mouth. 05/07/17  Yes Nahser, Deloris PingPhilip J, MD  atorvastatin (LIPITOR) 40 MG tablet Take 20 mg by mouth daily.    Yes [provider]  carbidopa-levodopa (SINEMET CR) 50-200 MG tablet Take 1 tablet by mouth every morning.    Yes [provider]  carbidopa-levodopa (SINEMET IR) 25-100 MG tablet Take 2 tablets by mouth 3 (three) times daily.   Yes [provider]  insulin aspart (NOVOLOG FLEXPEN) 100 UNIT/ML FlexPen Inject into the skin 3 (three) times daily with meals.   Yes [provider]  insulin glargine (LANTUS) 100 UNIT/ML injection Inject 45 Units into the skin at bedtime.   Yes [provider]  Insulin Pen Needle (PEN NEEDLES 5/16") 30G X 8 MM MISC by Does not apply route.   Yes [provider]  losartan-hydrochlorothiazide (HYZAAR) 100-12.5 MG tablet Take 1 tablet by mouth daily.    Yes [provider]  metoprolol (LOPRESSOR) 50 MG tablet Take 50 mg by mouth 2 (two) times daily.   Yes [provider]  Multiple Vitamin (MULTIVITAMIN) tablet Take 1 tablet by mouth daily.   Yes [provider]  vitamin B-12 (CYANOCOBALAMIN) 1000 MCG tablet Take 1,000 mcg by mouth daily.   Yes [provider]    Allergies:  Allergies  Allergen Reactions  . Augmentin [Amoxicillin-Pot Clavulanate]   . Sulfa Antibiotics Other (See Comments)    FLUSH Flushing Other reaction(s): Other (See Comments) Flushing     Social History   Socioeconomic History  . Marital status: Married    Spouse name: Not on file  . Number of children: Not on file  . Years of education: Not on file  . Highest education level: Not on file  Social Needs  . Financial resource  strain: Not on file  . Food insecurity - worry: Not on file  . Food insecurity - inability: Not on file  . Transportation needs - medical: Not on file  . Transportation needs - non-medical: Not on file  Occupational History  . Not on file  Tobacco Use  . Smoking status: Never Smoker  . Smokeless tobacco: Never Used  Substance and Sexual Activity  . Alcohol use: No  . Drug use: No  . Sexual activity: Yes  Other Topics Concern  . Not on file  Social History Narrative  . Not on file     Family History  Problem Relation Age of Onset  . Bronchitis Mother   . Coronary artery disease Father   . Heart disease Father   . Hypertension Sister   . Hypertension Brother      ROS:  Please see the history of present illness.     All other systems reviewed and negative.    Physical Exam: Blood pressure 124/70, pulse 92, height 5' 8.5" (1.74 m), weight 205 lb 12.8 oz (93.4 kg), SpO2 96 %. General: Well developed, well nourished male in no acute distress. Head: Normocephalic, atraumatic, sclera non-icteric, no xanthomas, nares are without discharge. EENT: normal  Lymph Nodes:  none Neck: Negative for carotid bruits. JVD not elevated. Back:without scoliosis kyphosis  Lungs: Clear bilaterally to auscultation without wheezes, rales, or rhonchi. Breathing is unlabored. Heart: RRR with S1 S2. No murmur . No rubs, or gallops appreciated. Abdomen: Soft, non-tender, non-distended with normoactive bowel sounds. No hepatomegaly. No rebound/guarding. No obvious abdominal masses. Msk:  Strength and tone appear normal for age. Extremities: No clubbing or cyanosis. No edema.  Distal pedal pulses are 2+ and equal bilaterally. Skin: Warm and Dry Neuro: Alert and oriented X 3. CN III-XII intact Grossly normal sensory and motor function . Psych:  Responds to questions appropriately with a normal affect.      Labs: Cardiac Enzymes No results for input(s): CKTOTAL, CKMB, TROPONINI in the last 72  hours. CBC No results found for: WBC, HGB, HCT, MCV, PLT PROTIME: No results for input(s): LABPROT, INR in the last 72 hours. Chemistry No results for input(s): NA, K, CL, CO2, BUN, CREATININE, CALCIUM, PROT, BILITOT, ALKPHOS, ALT, AST, GLUCOSE in the last 168 hours.  Invalid input(s): LABALBU Lipids Lab Results  Component Value Date   CHOL 115 12/16/2016   HDL 48 12/16/2016   LDLCALC 52 12/16/2016   TRIG 74 12/16/2016   BNP No results found for: PROBNP Thyroid Function Tests: No results for input(s): TSH, T4TOTAL, T3FREE, THYROIDAB in the last 72 hours.  Invalid input(s): FREET3 Miscellaneous No results found for: DDIMER  Radiology/Studies:  No results found.  EKG: Sinus rhythm with P synchronous pacing Intervals 16/16/44  Assessment and Plan:  Cardiomyopathy question mechanism  Ischemic heart disease with prior bypass surgery  Exertional neck discomfort question anginal equivalent  Congestive heart failure-chronic-systolic-class IIb-III 8  Complete heart block  Pacemaker-Medtronic   The patient has a cardiomyopathy with potential contributors being progressive coronary disease as suggested by the exertional neck discomfort, pacemaker.  It begs a revision of medical therapy--first we will change from Lopressor--carvedilol based on the comet data After this initiation, I would begin him on Aldactone as the incremental benefit is greater than it is with switching Cozaar to [employer].  Once he is on these 2 medications I would switch him to [facility].  Decisions regarding device revision with either CRT or ICD upgrade may be deferred until he is maximally medically treated.  This would include catheterization and possible revascularization.  I suspect that he has various contributors to his cardiomyopathy.  He also has congestive heart failure.  He is euvolemic so we will start with the medication adjustments as noted above.   After the aforementioned discussion he  describes himself as "shell shocked "he and his wife would like to go home and consider options regarding further testing.    We will wait to hear from him.  He has given us permission to be in touch with him after 3 weeks      Sherryl MangesSteven Azarion Hove

## 2017-05-26 NOTE — Patient Instructions (Addendum)
Medication Instructions: Your physician has recommended you make the following change in your medication:  -1) STOP Metoprolol Tartrate (Lopressor)  -2) START Carvedilol 12.5 mg - Take 1 tablet (12.5 mg) by mouth twice daily  Labwork: None Ordered  Procedures/Testing: None Ordered  Follow-Up: Your physician wants you to follow-up in: 1 YEAR with Dr. Graciela HusbandsKlein. You will receive a reminder letter in the mail two months in advance. If you don't receive a letter, please call our office to schedule the follow-up appointment.  Remote monitoring is used to monitor your Pacemaker from home. This monitoring reduces the number of office visits required to check your device to one time per year. It allows us to keep an eye on the functioning of your device to ensure it is working properly. You are scheduled for a device check from home on 08/25/17. You may send your transmission at any time that day. If you have a wireless device, the transmission will be sent automatically. After your physician reviews your transmission, you will receive a postcard with your next transmission date.   If you need a refill on your cardiac medications before your next appointment, please call your pharmacy.   --Call if you decide to proceed with Cardiac Catheterization --

## 2017-05-28 ENCOUNTER — Telehealth: Payer: Self-pay | Admitting: Internal Medicine

## 2017-05-28 NOTE — Telephone Encounter (Signed)
Message routed to Dr. Odessa FlemingKlein's covering CMA. Verbally made her aware of the call at 3:15 PM who agrees to follow up on this.

## 2017-05-28 NOTE — Telephone Encounter (Signed)
New Message  Pt call requesting to speak with RN about schedule cath procedure. Please call back to discuss

## 2017-05-29 NOTE — Telephone Encounter (Signed)
Spoke with patient who is aware and agreeable to Left and Right heart Cath with Dr. Excell Seltzerooper on 12/11. Verbally went over patient pre procedure instructions and will mail letter today. patient is aware that he will have to have pre procedure labs. I have agreed to put labs in for either [facility] in [place] or at patients pcp for convenience. I called patients PCP today and they were closed so will attempt to call next week when I return to office. Patient is agreeable.

## 2017-06-03 DIAGNOSIS — I2510 Atherosclerotic heart disease of native coronary artery without angina pectoris: Secondary | ICD-10-CM | POA: Diagnosis not present

## 2017-06-03 DIAGNOSIS — I442 Atrioventricular block, complete: Secondary | ICD-10-CM | POA: Diagnosis not present

## 2017-06-03 DIAGNOSIS — Z7901 Long term (current) use of anticoagulants: Secondary | ICD-10-CM | POA: Diagnosis not present

## 2017-06-03 DIAGNOSIS — Z0182 Encounter for allergy testing: Secondary | ICD-10-CM | POA: Diagnosis not present

## 2017-06-15 ENCOUNTER — Telehealth: Payer: Self-pay

## 2017-06-15 NOTE — Telephone Encounter (Signed)
Patient [facility] scheduled for:  06/16/2017 @ 1030 Verified arrival time and place:  NT @ 0800 Confirmed AM meds to be taken pre-cath with sip of water: Take ASA, sinemet No losartan/hctz am of  1/2 amount Lantus night prior Confirmed patient has responsible person to drive home post procedure and observe patient for 24 hours:  yes Addl concerns:  Concerned about groin access vs wrist access-asked Pt to discuss with Dr. Excell Seltzerooper

## 2017-06-16 ENCOUNTER — Encounter (HOSPITAL_COMMUNITY)
Admission: RE | Disposition: A | Discharge status: Home / Self Care | Payer: Self-pay | Source: Ambulatory Visit | Attending: Cardiovascular Disease

## 2017-06-16 ENCOUNTER — Ambulatory Visit (HOSPITAL_COMMUNITY)
Admission: RE | Admit: 2017-06-16 | Discharge: 2017-06-16 | Disposition: A | Discharge status: Home / Self Care | Payer: Medicare Other | Source: Ambulatory Visit | Attending: Cardiovascular Disease | Admitting: Cardiovascular Disease

## 2017-06-16 DIAGNOSIS — Z882 Allergy status to sulfonamides status: Secondary | ICD-10-CM | POA: Diagnosis not present

## 2017-06-16 DIAGNOSIS — Z880 Allergy status to penicillin: Secondary | ICD-10-CM | POA: Diagnosis not present

## 2017-06-16 DIAGNOSIS — I5042 Chronic combined systolic (congestive) and diastolic (congestive) heart failure: Secondary | ICD-10-CM | POA: Diagnosis present

## 2017-06-16 DIAGNOSIS — I5022 Chronic systolic (congestive) heart failure: Secondary | ICD-10-CM

## 2017-06-16 DIAGNOSIS — E119 Type 2 diabetes mellitus without complications: Secondary | ICD-10-CM | POA: Insufficient documentation

## 2017-06-16 DIAGNOSIS — Z950 Presence of cardiac pacemaker: Secondary | ICD-10-CM | POA: Diagnosis not present

## 2017-06-16 DIAGNOSIS — I442 Atrioventricular block, complete: Secondary | ICD-10-CM | POA: Insufficient documentation

## 2017-06-16 DIAGNOSIS — Z79899 Other long term (current) drug therapy: Secondary | ICD-10-CM | POA: Insufficient documentation

## 2017-06-16 DIAGNOSIS — I25810 Atherosclerosis of coronary artery bypass graft(s) without angina pectoris: Secondary | ICD-10-CM | POA: Diagnosis not present

## 2017-06-16 DIAGNOSIS — I110 Hypertensive heart disease with heart failure: Secondary | ICD-10-CM | POA: Diagnosis not present

## 2017-06-16 DIAGNOSIS — I2510 Atherosclerotic heart disease of native coronary artery without angina pectoris: Secondary | ICD-10-CM

## 2017-06-16 DIAGNOSIS — I255 Ischemic cardiomyopathy: Secondary | ICD-10-CM | POA: Diagnosis not present

## 2017-06-16 DIAGNOSIS — Z881 Allergy status to other antibiotic agents status: Secondary | ICD-10-CM | POA: Diagnosis not present

## 2017-06-16 DIAGNOSIS — Z7982 Long term (current) use of aspirin: Secondary | ICD-10-CM | POA: Diagnosis not present

## 2017-06-16 DIAGNOSIS — I2582 Chronic total occlusion of coronary artery: Secondary | ICD-10-CM | POA: Insufficient documentation

## 2017-06-16 DIAGNOSIS — Z794 Long term (current) use of insulin: Secondary | ICD-10-CM | POA: Insufficient documentation

## 2017-06-16 HISTORY — PX: RIGHT/LEFT HEART CATH AND CORONARY/GRAFT ANGIOGRAPHY: CATH118267

## 2017-06-16 LAB — POCT I-STAT 3, ART BLOOD GAS (G3+)
Acid-base deficit: 1 mmol/L (ref 0.0–2.0)
Bicarbonate: 25 mmol/L (ref 20.0–28.0)
O2 Saturation: 94 %
PCO2 ART: 45.6 mmHg (ref 32.0–48.0)
PH ART: 7.348 — AB (ref 7.350–7.450)
PO2 ART: 73 mmHg — AB (ref 83.0–108.0)
TCO2: 26 mmol/L (ref 22–32)

## 2017-06-16 LAB — POCT I-STAT 3, VENOUS BLOOD GAS (G3P V)
ACID-BASE DEFICIT: 1 mmol/L (ref 0.0–2.0)
BICARBONATE: 24.8 mmol/L (ref 20.0–28.0)
O2 Saturation: 65 %
PCO2 VEN: 46.1 mmHg (ref 44.0–60.0)
TCO2: 26 mmol/L (ref 22–32)
pH, Ven: 7.34 (ref 7.250–7.430)
pO2, Ven: 36 mmHg (ref 32.0–45.0)

## 2017-06-16 LAB — GLUCOSE, CAPILLARY
Glucose-Capillary: 137 mg/dL — ABNORMAL HIGH (ref 65–99)
Glucose-Capillary: 125 mg/dL — ABNORMAL HIGH (ref 65–99)
Glucose-Capillary: 118 mg/dL — ABNORMAL HIGH (ref 65–99)

## 2017-06-16 SURGERY — RIGHT/LEFT HEART CATH AND CORONARY/GRAFT ANGIOGRAPHY
Anesthesia: LOCAL

## 2017-06-16 MED ORDER — SODIUM CHLORIDE 0.9 % IV SOLN: 250.0000 mL | INTRAVENOUS | Status: DC | PRN

## 2017-06-16 MED ORDER — SODIUM CHLORIDE 0.9% FLUSH: 3.0000 mL | Freq: Two times a day (BID) | INTRAVENOUS | Status: DC

## 2017-06-16 MED ORDER — HEPARIN (PORCINE) IN NACL 2-0.9 UNIT/ML-% IJ SOLN
INTRAMUSCULAR | Status: AC
  Filled 2017-06-16: qty 1000

## 2017-06-16 MED ORDER — FENTANYL CITRATE (PF) 100 MCG/2ML IJ SOLN
INTRAMUSCULAR | Status: AC
  Filled 2017-06-16: qty 2

## 2017-06-16 MED ORDER — HEPARIN (PORCINE) IN NACL 2-0.9 UNIT/ML-% IJ SOLN
INTRAMUSCULAR | Status: AC | PRN
  Administered 2017-06-16: 1000 mL via INTRA_ARTERIAL

## 2017-06-16 MED ORDER — MIDAZOLAM HCL 2 MG/2ML IJ SOLN
INTRAMUSCULAR | Status: DC | PRN
  Administered 2017-06-16: 1 mg via INTRAVENOUS

## 2017-06-16 MED ORDER — LIDOCAINE HCL (PF) 1 % IJ SOLN
INTRAMUSCULAR | Status: AC
  Filled 2017-06-16: qty 30

## 2017-06-16 MED ORDER — SODIUM CHLORIDE 0.9 % WEIGHT BASED INFUSION: 1.0000 mL/kg/h | INTRAVENOUS | Status: DC

## 2017-06-16 MED ORDER — SODIUM CHLORIDE 0.9% FLUSH: 3.0000 mL | INTRAVENOUS | Status: DC | PRN

## 2017-06-16 MED ORDER — ASPIRIN 81 MG PO CHEW: 81.0000 mg | CHEWABLE_TABLET | ORAL | Status: DC

## 2017-06-16 MED ORDER — SODIUM CHLORIDE 0.9 % WEIGHT BASED INFUSION
3.0000 mL/kg/h | INTRAVENOUS | Status: AC
  Administered 2017-06-16: 3 mL/kg/h via INTRAVENOUS

## 2017-06-16 MED ORDER — ONDANSETRON HCL 4 MG/2ML IJ SOLN: 4.0000 mg | Freq: Four times a day (QID) | INTRAMUSCULAR | Status: DC | PRN

## 2017-06-16 MED ORDER — FENTANYL CITRATE (PF) 100 MCG/2ML IJ SOLN
INTRAMUSCULAR | Status: DC | PRN
  Administered 2017-06-16: 25 ug via INTRAVENOUS

## 2017-06-16 MED ORDER — ACETAMINOPHEN 325 MG PO TABS: 650.0000 mg | ORAL_TABLET | ORAL | Status: DC | PRN

## 2017-06-16 MED ORDER — MIDAZOLAM HCL 2 MG/2ML IJ SOLN
INTRAMUSCULAR | Status: AC
  Filled 2017-06-16: qty 2

## 2017-06-16 MED ORDER — LIDOCAINE HCL (PF) 1 % IJ SOLN
INTRAMUSCULAR | Status: DC | PRN
  Administered 2017-06-16: 15 mL via INTRADERMAL

## 2017-06-16 MED ORDER — IOPAMIDOL (ISOVUE-370) INJECTION 76%
INTRAVENOUS | Status: DC | PRN
  Administered 2017-06-16: 125 mL via INTRA_ARTERIAL

## 2017-06-16 SURGICAL SUPPLY — 14 items
CATH INFINITI 5 FR AR1 MOD (CATHETERS) ×2 IMPLANT
CATH INFINITI 5 FR MPA2 (CATHETERS) ×2 IMPLANT
CATH INFINITI 5 FR RCB (CATHETERS) ×2 IMPLANT
CATH INFINITI 5FR MULTPACK ANG (CATHETERS) ×2 IMPLANT
CATH SWAN GANZ 7F STRAIGHT (CATHETERS) ×2 IMPLANT
COVER PRB 48X5XTLSCP FOLD TPE (BAG) ×1 IMPLANT
COVER PROBE 5X48 (BAG) ×1
KIT HEART LEFT (KITS) ×2 IMPLANT
PACK CARDIAC CATHETERIZATION (CUSTOM PROCEDURE TRAY) ×2 IMPLANT
SHEATH PINNACLE 5F 10CM (SHEATH) ×2 IMPLANT
SHEATH PINNACLE 7F 10CM (SHEATH) ×2 IMPLANT
SYR MEDRAD MARK V 150ML (SYRINGE) ×2 IMPLANT
TRANSDUCER W/STOPCOCK (MISCELLANEOUS) ×2 IMPLANT
WIRE EMERALD 3MM-J .035X150CM (WIRE) ×2 IMPLANT

## 2017-06-16 NOTE — Interval H&P Note (Signed)
History and Physical Interval Note:  06/16/2017 9:16 AM  Jeffrey Parsons  has presented today for surgery, with the diagnosis of cad  The various methods of treatment have been discussed with the patient and family. After consideration of risks, benefits and other options for treatment, the patient has consented to  Procedure(s): RIGHT/LEFT HEART CATH AND CORONARY/GRAFT ANGIOGRAPHY (N/A) as a surgical intervention .  The patient's history has been reviewed, patient examined, no change in status, stable for surgery.  I have reviewed the patient's chart and labs.  Questions were answered to the patient's satisfaction.    Pt seen in pre-procedure area. Plan for R/L heart cath for progressive LV dysfunction, known CAD With remote CABG. Reviewed risks/indications with patient and wife at length all questions answered. Right femoral approach in patient post-CABG with left radial harvested.   Tonny BollmanMichael Waqas Bruhl

## 2017-06-16 NOTE — Progress Notes (Signed)
Patient had minor bleeding on tegaderm less than a size of a quarter on guaze. Pressure was held for 10 minutes by Margette FastKermit Robinson. Bedrest started at 1200.

## 2017-06-16 NOTE — Discharge Instructions (Signed)

## 2017-06-16 NOTE — Progress Notes (Signed)
377fr venous sheath aspirated and removed from rfv. Manual pressure applied for 10 minutes.  5Fr aterial sheath aspirated and removed from rfv.Manual pressure applied for 20 minutes over both sites. Groin level 0 tegaderm dressing applied, bedrest instructions given.  Bilateral dp pulses palpable.   Bedrest begins at 11:30:00

## 2017-06-17 ENCOUNTER — Encounter (HOSPITAL_COMMUNITY): Payer: Self-pay | Admitting: Cardiovascular Disease

## 2017-06-30 DIAGNOSIS — J189 Pneumonia, unspecified organism: Secondary | ICD-10-CM | POA: Insufficient documentation

## 2017-06-30 HISTORY — DX: Pneumonia, unspecified organism: J18.9

## 2017-08-10 DIAGNOSIS — J0100 Acute maxillary sinusitis, unspecified: Secondary | ICD-10-CM | POA: Diagnosis not present

## 2017-08-15 DIAGNOSIS — J189 Pneumonia, unspecified organism: Secondary | ICD-10-CM | POA: Diagnosis not present

## 2017-08-17 DIAGNOSIS — J180 Bronchopneumonia, unspecified organism: Secondary | ICD-10-CM | POA: Diagnosis not present

## 2017-08-25 ENCOUNTER — Ambulatory Visit (INDEPENDENT_AMBULATORY_CARE_PROVIDER_SITE_OTHER): Payer: Medicare Other | Admitting: *Deleted

## 2017-08-25 DIAGNOSIS — I442 Atrioventricular block, complete: Secondary | ICD-10-CM

## 2017-08-25 NOTE — Progress Notes (Signed)
Remote pacemaker transmission.   

## 2017-08-27 ENCOUNTER — Encounter: Payer: Self-pay | Admitting: Cardiology

## 2017-08-31 DIAGNOSIS — E119 Type 2 diabetes mellitus without complications: Secondary | ICD-10-CM | POA: Diagnosis not present

## 2017-08-31 DIAGNOSIS — G20 Parkinson's disease: Secondary | ICD-10-CM | POA: Diagnosis not present

## 2017-08-31 DIAGNOSIS — J4520 Mild intermittent asthma, uncomplicated: Secondary | ICD-10-CM | POA: Diagnosis not present

## 2017-08-31 DIAGNOSIS — I2510 Atherosclerotic heart disease of native coronary artery without angina pectoris: Secondary | ICD-10-CM | POA: Diagnosis not present

## 2017-08-31 DIAGNOSIS — E782 Mixed hyperlipidemia: Secondary | ICD-10-CM | POA: Diagnosis not present

## 2017-09-01 ENCOUNTER — Encounter: Payer: Self-pay | Admitting: Cardiovascular Disease

## 2017-09-03 DIAGNOSIS — E119 Type 2 diabetes mellitus without complications: Secondary | ICD-10-CM | POA: Diagnosis not present

## 2017-09-09 LAB — CUP PACEART REMOTE DEVICE CHECK
Battery Remaining Longevity: 28 mo
Brady Statistic AP VP Percent: 0 %
Brady Statistic AS VP Percent: 97 %
Brady Statistic AS VS Percent: 2 %
Date Time Interrogation Session: 20190226134944
Implantable Lead Implant Date: 20111213
Implantable Lead Implant Date: 20111213
Implantable Lead Location: 753860
Implantable Lead Model: 5076
Implantable Pulse Generator Implant Date: 20111213
Lead Channel Impedance Value: 601 Ohm
Lead Channel Pacing Threshold Amplitude: 0.75 V
Lead Channel Pacing Threshold Pulse Width: 0.4 ms
Lead Channel Setting Pacing Amplitude: 2 V
Lead Channel Setting Sensing Sensitivity: 2.8 mV
MDC IDC LEAD LOCATION: 753859
MDC IDC MSMT BATTERY IMPEDANCE: 2043 Ohm
MDC IDC MSMT BATTERY VOLTAGE: 2.74 V
MDC IDC MSMT LEADCHNL RA IMPEDANCE VALUE: 564 Ohm
MDC IDC MSMT LEADCHNL RA PACING THRESHOLD AMPLITUDE: 0.75 V
MDC IDC MSMT LEADCHNL RA PACING THRESHOLD PULSEWIDTH: 0.4 ms
MDC IDC SET LEADCHNL RV PACING AMPLITUDE: 2.5 V
MDC IDC SET LEADCHNL RV PACING PULSEWIDTH: 0.4 ms
MDC IDC STAT BRADY AP VS PERCENT: 0 %

## 2017-10-01 DIAGNOSIS — E113293 Type 2 diabetes mellitus with mild nonproliferative diabetic retinopathy without macular edema, bilateral: Secondary | ICD-10-CM | POA: Diagnosis not present

## 2017-10-01 DIAGNOSIS — H26493 Other secondary cataract, bilateral: Secondary | ICD-10-CM | POA: Diagnosis not present

## 2017-11-02 ENCOUNTER — Other Ambulatory Visit: Payer: Self-pay | Admitting: Cardiovascular Disease

## 2017-11-10 ENCOUNTER — Other Ambulatory Visit: Payer: Self-pay | Admitting: Cardiovascular Disease

## 2017-11-25 ENCOUNTER — Ambulatory Visit (INDEPENDENT_AMBULATORY_CARE_PROVIDER_SITE_OTHER): Payer: Medicare Other | Admitting: *Deleted

## 2017-11-25 DIAGNOSIS — I442 Atrioventricular block, complete: Secondary | ICD-10-CM

## 2017-11-27 LAB — CUP PACEART REMOTE DEVICE CHECK
Battery Impedance: 2309 Ohm
Battery Remaining Longevity: 25 mo
Battery Voltage: 2.74 V
Date Time Interrogation Session: 20190529130400
Implantable Lead Location: 753859
Implantable Lead Model: 5076
Lead Channel Setting Pacing Amplitude: 2.5 V
Lead Channel Setting Pacing Pulse Width: 0.4 ms
Lead Channel Setting Sensing Sensitivity: 4 mV
MDC IDC LEAD IMPLANT DT: 20111213
MDC IDC LEAD IMPLANT DT: 20111213
MDC IDC LEAD LOCATION: 753860
MDC IDC MSMT LEADCHNL RA IMPEDANCE VALUE: 541 Ohm
MDC IDC MSMT LEADCHNL RA PACING THRESHOLD AMPLITUDE: 0.75 V
MDC IDC MSMT LEADCHNL RA PACING THRESHOLD PULSEWIDTH: 0.4 ms
MDC IDC MSMT LEADCHNL RV IMPEDANCE VALUE: 578 Ohm
MDC IDC MSMT LEADCHNL RV PACING THRESHOLD AMPLITUDE: 0.75 V
MDC IDC MSMT LEADCHNL RV PACING THRESHOLD PULSEWIDTH: 0.4 ms
MDC IDC PG IMPLANT DT: 20111213
MDC IDC SET LEADCHNL RA PACING AMPLITUDE: 2 V
MDC IDC STAT BRADY AP VP PERCENT: 1 %
MDC IDC STAT BRADY AP VS PERCENT: 0 %
MDC IDC STAT BRADY AS VP PERCENT: 94 %
MDC IDC STAT BRADY AS VS PERCENT: 5 %

## 2017-11-27 NOTE — Progress Notes (Signed)
Remote pacemaker transmission.   

## 2017-12-14 ENCOUNTER — Encounter: Payer: Self-pay | Admitting: Cardiovascular Disease

## 2017-12-14 ENCOUNTER — Ambulatory Visit (INDEPENDENT_AMBULATORY_CARE_PROVIDER_SITE_OTHER): Payer: Medicare Other | Admitting: Cardiovascular Disease

## 2017-12-14 VITALS — BP 98/50 | HR 90 | Ht 68.0 in | Wt 199.0 lb

## 2017-12-14 DIAGNOSIS — E782 Mixed hyperlipidemia: Secondary | ICD-10-CM

## 2017-12-14 DIAGNOSIS — I2510 Atherosclerotic heart disease of native coronary artery without angina pectoris: Secondary | ICD-10-CM | POA: Diagnosis not present

## 2017-12-14 DIAGNOSIS — I952 Hypotension due to drugs: Secondary | ICD-10-CM | POA: Diagnosis not present

## 2017-12-14 MED ORDER — LOSARTAN POTASSIUM 50 MG PO TABS: 50.0000 mg | ORAL_TABLET | Freq: Every day | ORAL | 3 refills | Status: DC

## 2017-12-14 NOTE — Patient Instructions (Signed)
Medication Instructions:  Your physician has recommended you make the following change in your medication:   STOP Potassium supplement STOP Hyzaar START Losartan 50 mg once daily   Labwork: None Ordered   Testing/Procedures: None Ordered   Follow-Up: Your physician recommends that you schedule a follow-up appointment in: 3 months with APP on Dr. Harvie BridgeNahser's team   If you need a refill on your cardiac medications before your next appointment, please call your pharmacy.   Thank you for choosing [facility]! Eligha BridegroomMichelle Swinyer, RN [phone]

## 2017-12-14 NOTE — Progress Notes (Signed)
Cardiology Office Note   Date:  12/14/2017   ID:  Jeffrey Parsons, DOB 1951/06/09, MRN [id number]  PCP:  Jeffrey Parsons, Jeffrey Edward, MD  Cardiologist:   Jeffrey MissPhilip Aven Christen, MD   Chief Complaint  Patient presents with  . Coronary Artery Disease   Problem lis 1. CAD - [facility] ( [facility])  2. Diabetes 3. Pacer  4. Hyperlipidemia  5. Parkinsons disease 6. Asthma     Jeffrey Parsons is a 67 y.o. male who presents for further evaluation of his CAD CABG in 2001 at [facility]  - age 67   Pacer in 2011 [place].  estabilshing care here   No angina .  Operated a [employer] for 40 years.  Now retired.  Exercises regularly   Nov. 82018:  Doing well.   No Cp or dyspea,  No cough or cold symptoms Goes to  the gym 5 days a week  On his own, he  Decreased the atorvastatin to 20   December 14, 2017: Doing well.  Has established with Dr. Graciela Parsons for EP . [employer] 5 days a week .  Does the bike, treadmill , weight machines  Hx of hyperlipidemia  BP is a bit low today  - is on Sinamet  Does not check it at home .   Eats regularly    Past Medical History:  Diagnosis Date  . Complete heart block (HCC) 05/26/2017  . Congestive heart failure, NYHA class 3, chronic, systolic (HCC) 05/26/2017  . Coronary artery disease involving native coronary artery of native heart without angina pectoris 09/12/2016  . Hypertension   . Hypoglycemia   . Ischemic cardiomyopathy 05/26/2017  . Pacemaker 05/26/2017  . Retinopathy   . Type 2 diabetes mellitus (HCC)     Past Surgical History:  Procedure Laterality Date  . 5 WAY HEART BYPASS  2001  . KNEE SURGERY Left    AS A CHILD  . PACEMAKER IMPLANT  05/2010  . RIGHT/LEFT HEART CATH AND CORONARY/GRAFT ANGIOGRAPHY N/A 06/16/2017   Procedure: RIGHT/LEFT HEART CATH AND CORONARY/GRAFT ANGIOGRAPHY;  Surgeon: Jeffrey Parsons, Michael, MD;  [facility];  Service: Cardiovascular;  Laterality: N/A;     Current  Outpatient Medications  Medication Sig Dispense Refill  . albuterol (PROAIR HFA) 108 (90 Base) MCG/ACT inhaler Inhale 2 puffs into the lungs every 6 (six) hours as needed for wheezing or shortness of breath.    [name]. aspirin EC 81 MG tablet Take 1 tablet (81 mg total) daily by mouth.    . carbidopa-levodopa (SINEMET CR) 50-200 MG tablet Take 1 tablet by mouth every evening.     . carbidopa-levodopa (SINEMET IR) 25-100 MG tablet Take 2 tablets by mouth 2 (two) times daily.     . carvedilol (COREG) 12.5 MG tablet Take 1 tablet (12.5 mg total) by mouth 2 (two) times daily. 180 tablet 3  . insulin aspart (NOVOLOG FLEXPEN) 100 UNIT/ML FlexPen Inject into the skin 3 (three) times daily with meals. 9-14 units    . insulin glargine (LANTUS) 100 UNIT/ML injection Inject 40-42 Units into the skin at bedtime.     . Insulin Pen Needle (PEN NEEDLES 5/16") 30G X 8 MM MISC by Does not apply route.    [name]. losartan-hydrochlorothiazide (HYZAAR) 100-12.5 MG tablet Take 1 tablet by mouth daily.     . Multiple Vitamin (MULTIVITAMIN) tablet Take 1 tablet by mouth daily.    . potassium gluconate 595 (99 K) MG TABS tablet  Take 595 mg by mouth every other day.    . vitamin B-12 (CYANOCOBALAMIN) 1000 MCG tablet Take 1,000 mcg by mouth daily.    . vitamin C (ASCORBIC ACID) 500 MG tablet Take 500 mg by mouth daily.     No current facility-administered medications for this visit.     PAD Screen 09/12/2016  Previous PAD dx? No  Previous surgical procedure? No  Pain with walking? No  Feet/toe relief with dangling? No  Painful, non-healing ulcers? No  Extremities discolored? No      Allergies:   Augmentin [amoxicillin-pot clavulanate] and Sulfa antibiotics    Social History:  The patient  reports that he has never smoked. He has never used smokeless tobacco. He reports that he does not drink alcohol or use drugs.   Family History:  The patient's family history includes Bronchitis in his mother; Coronary artery disease  in his father; Heart disease in his father; Hypertension in his brother and sister.    ROS:  Please see the history of present illness.    Physical Exam: Blood pressure (!) 98/50, pulse 90, height 5\' 8"  (1.727 m), weight 199 lb (90.3 kg), SpO2 95 %.  GEN:  Well nourished, well developed in no acute distress HEENT: Normal NECK: No JVD; No carotid bruits LYMPHATICS: No lymphadenopathy CARDIAC: RRR , no murmurs, rubs, gallops RESPIRATORY:  Clear to auscultation without rales, wheezing or rhonchi  ABDOMEN: Soft, non-tender, non-distended MUSCULOSKELETAL:  No edema; No deformity  SKIN: Warm and dry NEUROLOGIC:  Alert and oriented x 3    EKG:       Recent Labs: 12/16/2016: ALT 14; BUN 20; Creatinine, Ser 0.83; Potassium 4.4; Sodium 138    Lipid Panel    Component Value Date/Time   CHOL 115 12/16/2016 0924   TRIG 74 12/16/2016 0924   HDL 48 12/16/2016 0924   CHOLHDL 2.4 12/16/2016 0924   LDLCALC 52 12/16/2016 0924      Wt Readings from Last 3 Encounters:  12/14/17 199 lb (90.3 kg)  06/16/17 196 lb (88.9 kg)  05/26/17 205 lb 12.8 oz (93.4 kg)      Other studies Reviewed: Additional studies/ records that were reviewed today include: . Review of the above records demonstrates:    ASSESSMENT AND PLAN:  1.  1. Coronary artery disease:   No episodes of angina.  Seems to be doing very well.  2. Hyperlipidemia:  Labs from medical doctor look great - scanned into system today   3. Essential hypertension:    Pressure is low today.  He is on losartan HCT 100 mg-12.5 mill grams a day.  We will discontinue the  HCT and reduce the  losartan to  50 mg a day.  4.  Sick sinus syndrome  -  S/p pacer   I'll see him in 6 months.    Jeffrey Miss, MD  12/14/2017 4:43 PM    [facility] [address] [place], [place], [place]  [address] Phone: [phone]; Fax: [phone]

## 2017-12-25 ENCOUNTER — Telehealth: Payer: Self-pay | Admitting: Cardiovascular Disease

## 2017-12-25 DIAGNOSIS — Z79899 Other long term (current) drug therapy: Secondary | ICD-10-CM

## 2017-12-25 DIAGNOSIS — M7989 Other specified soft tissue disorders: Secondary | ICD-10-CM

## 2017-12-25 MED ORDER — HYDROCHLOROTHIAZIDE 25 MG PO TABS: 25.0000 mg | ORAL_TABLET | Freq: Every day | ORAL | 3 refills | Status: DC

## 2017-12-25 MED ORDER — POTASSIUM CHLORIDE ER 10 MEQ PO TBCR: 10.0000 meq | EXTENDED_RELEASE_TABLET | Freq: Every day | ORAL | 3 refills | Status: DC

## 2017-12-25 NOTE — Telephone Encounter (Signed)
Lets start HCTZ 25  mg a day and Kdur 10 meq a day   .  Lets not restart the Losartan / HCTZ combination pill at this time.  Check BMP in 2-1 weeks. Have him continue to check BP regularly.

## 2017-12-25 NOTE — Telephone Encounter (Signed)
Notes from last office visit shows -  "3. Essential hypertension:    Pressure is low today.  He is on losartan HCT 100 mg-12.5 mill grams a day.  We will discontinue the  HCT and reduce the  losartan to  50 mg a day."  Patient's BP was 98/50 at office visit on 12/14/17  Patient complaining of right leg swelling, and SOB with activity. Patient stated today he BP is 136-84 HR 92 and BP 139/87 HR 101. Patient wanted to know if he could go back to Hyzaar 100/12.5 mg. Will forward to Dr. Elease HashimotoNahser for advisement.

## 2017-12-25 NOTE — Telephone Encounter (Signed)
Called patient about Dr. Harvie BridgeNahser's recommendations. Patient verbalized understanding. Patient will start taking HCTZ 25 mg daily and Kdur 10 meq daily. Patient will [facility] [place], the week of 8th. Patient will continue to watch his BP.

## 2017-12-25 NOTE — Telephone Encounter (Signed)
New Message:       Pt c/o medication issue:  1. Name of Medication:losartan (COZAAR) 50 MG tablet   2. How are you currently taking this medication (dosage and times per day)? Take 1 tablet (50 mg total) by mouth daily.  3. Are you having a reaction (difficulty breathing--STAT)? No  4. What is your medication issue? Pt states his fluid build up has changed since Dr. Riley Churcheshanged this medication

## 2018-01-04 DIAGNOSIS — I442 Atrioventricular block, complete: Secondary | ICD-10-CM | POA: Diagnosis not present

## 2018-01-04 DIAGNOSIS — J4520 Mild intermittent asthma, uncomplicated: Secondary | ICD-10-CM | POA: Diagnosis not present

## 2018-01-04 DIAGNOSIS — Z950 Presence of cardiac pacemaker: Secondary | ICD-10-CM | POA: Diagnosis not present

## 2018-01-04 DIAGNOSIS — I2510 Atherosclerotic heart disease of native coronary artery without angina pectoris: Secondary | ICD-10-CM | POA: Diagnosis not present

## 2018-01-04 DIAGNOSIS — E782 Mixed hyperlipidemia: Secondary | ICD-10-CM | POA: Diagnosis not present

## 2018-01-04 DIAGNOSIS — E1169 Type 2 diabetes mellitus with other specified complication: Secondary | ICD-10-CM | POA: Diagnosis not present

## 2018-01-04 DIAGNOSIS — I5022 Chronic systolic (congestive) heart failure: Secondary | ICD-10-CM | POA: Diagnosis not present

## 2018-01-04 DIAGNOSIS — G20 Parkinson's disease: Secondary | ICD-10-CM | POA: Diagnosis not present

## 2018-01-11 NOTE — Telephone Encounter (Signed)
Left message for patient to inquire about lab results as none have been received

## 2018-02-04 NOTE — Telephone Encounter (Signed)
Spoke with patient who states [facility] July and that he had asked for copies to be faxed to us. I advised that those reports had not been sent. The patient does not know his lab results. I advised I will call [facility] to request copies. Spoke with medical records department at [facility] and requested copies be faxed to our office to my attention. Attendant states labs were obtained on 7/8 and patient's serum creatinine was 0.9. I thanked her for her help.

## 2018-02-24 ENCOUNTER — Ambulatory Visit (INDEPENDENT_AMBULATORY_CARE_PROVIDER_SITE_OTHER): Payer: Medicare Other | Admitting: *Deleted

## 2018-02-24 DIAGNOSIS — I442 Atrioventricular block, complete: Secondary | ICD-10-CM | POA: Diagnosis not present

## 2018-02-24 NOTE — Progress Notes (Signed)
Remote pacemaker transmission.   

## 2018-03-15 ENCOUNTER — Ambulatory Visit: Payer: Medicare Other | Admitting: Physician Assistant

## 2018-03-18 LAB — CUP PACEART REMOTE DEVICE CHECK
Battery Impedance: 2340 Ohm
Battery Remaining Longevity: 24 mo
Brady Statistic AP VP Percent: 1 %
Brady Statistic AS VP Percent: 95 %
Brady Statistic AS VS Percent: 5 %
Implantable Lead Implant Date: 20111213
Implantable Lead Implant Date: 20111213
Implantable Lead Location: 753859
Implantable Lead Model: 5076
Implantable Lead Model: 5076
Implantable Pulse Generator Implant Date: 20111213
Lead Channel Impedance Value: 548 Ohm
Lead Channel Impedance Value: 564 Ohm
Lead Channel Pacing Threshold Amplitude: 0.625 V
Lead Channel Pacing Threshold Amplitude: 0.75 V
Lead Channel Pacing Threshold Pulse Width: 0.4 ms
Lead Channel Setting Pacing Amplitude: 2 V
Lead Channel Setting Pacing Amplitude: 2.5 V
Lead Channel Setting Sensing Sensitivity: 5.6 mV
MDC IDC LEAD LOCATION: 753860
MDC IDC MSMT BATTERY VOLTAGE: 2.74 V
MDC IDC MSMT LEADCHNL RA PACING THRESHOLD PULSEWIDTH: 0.4 ms
MDC IDC SESS DTM: 20190828121827
MDC IDC SET LEADCHNL RV PACING PULSEWIDTH: 0.4 ms
MDC IDC STAT BRADY AP VS PERCENT: 0 %

## 2018-03-23 ENCOUNTER — Ambulatory Visit (INDEPENDENT_AMBULATORY_CARE_PROVIDER_SITE_OTHER): Payer: Medicare Other | Admitting: Physician Assistant

## 2018-03-23 ENCOUNTER — Encounter: Payer: Self-pay | Admitting: Physician Assistant

## 2018-03-23 VITALS — BP 114/60 | HR 90 | Ht 68.0 in | Wt 193.0 lb

## 2018-03-23 DIAGNOSIS — I2510 Atherosclerotic heart disease of native coronary artery without angina pectoris: Secondary | ICD-10-CM

## 2018-03-23 DIAGNOSIS — E782 Mixed hyperlipidemia: Secondary | ICD-10-CM | POA: Diagnosis not present

## 2018-03-23 DIAGNOSIS — Z950 Presence of cardiac pacemaker: Secondary | ICD-10-CM

## 2018-03-23 DIAGNOSIS — I10 Essential (primary) hypertension: Secondary | ICD-10-CM | POA: Diagnosis not present

## 2018-03-23 DIAGNOSIS — I5022 Chronic systolic (congestive) heart failure: Secondary | ICD-10-CM

## 2018-03-23 MED ORDER — SPIRONOLACTONE 25 MG PO TABS: 25.0000 mg | ORAL_TABLET | Freq: Every day | ORAL | 3 refills | Status: DC

## 2018-03-23 MED ORDER — ASPIRIN EC 81 MG PO TBEC: 81.0000 mg | DELAYED_RELEASE_TABLET | Freq: Every day | ORAL | Status: DC

## 2018-03-23 NOTE — Progress Notes (Signed)
Cardiology Office Note:    Date:  03/23/2018   ID:  Jeffrey Parsons, DOB 16-Mar-1951, MRN [id number]  PCP:  Abigail Miyamoto, MD  Cardiologist:  Kristeen Miss, MD   Electrophysiologist:  Sherryl Manges, MD   Referring MD: Abigail Miyamoto,*   Chief Complaint  Patient presents with  . Follow-up    CHF, CAD     History of Present Illness:    Jeffrey Parsons is a 67 y.o. male with coronary artery disease status post CABG in 2002, chronic systolic heart failure, sick sinus syndrome status post pacemaker, diabetes, hypertension, hyperlipidemia, Parkinson's disease.  Patient established with Dr. Elease Hashimoto in 2018.  Echo done at [facility] in 2017 was noted to show EF <25%.  He established with Dr. Graciela Husbands for EP who arranged cardiac catheterization.  This was performed in December 2018 and demonstrated total occlusion of the RCA, LCx and LAD, patent LIMA-LAD, patent SVG-diagonal and patent free radial-OM.  The SVG-PDA and SVG-PLA with both chronically occluded with left-to-right collaterals.  He was last seen by Dr. Elease Hashimoto in June 2019.  [name] returns for follow-up.  He is here alone.  He goes to the gym several times a week and walks on a treadmill and rides a stationary bike.  He denies significant shortness of breath.  He denies orthopnea, PND, lower extremity swelling or syncope.  He denies any bleeding issues or cough.  Prior CV studies:   The following studies were reviewed today:  Cardiac Catheterization 06/16/17 1. Severe native 3 vessel CAD with total occlusion of the RCA, LCx, and LAD 2. S/P CABG with continued patency of the LIMA-LAD, SVG-diagonal, and free radial-OM grafts 3. Chronic occlusion of the SVG-PDA and SVG-PLA, with the distal RCA receiving late antegrade filling and faint collaterals from the left coronary 4. Elevated LVEDP (21 mmHg), preserved cardiac output, and normal R heart pressures  Echo 05/15/16 EF < 25  Nuclear stress test 05/05/16 1.No evidence  of reversible ischemia. 2.Fixed defect consistent with previous infarct of the apical and septal walls of the left ventricle. 3.The left ventricular ejection fraction is 19%. 4.Left ventricular enlargement. 5.Stress Test results reported by Dr. Bryson Corona.  Past Medical History:  Diagnosis Date  . Complete heart block (HCC) 05/26/2017  . Congestive heart failure, NYHA class 3, chronic, systolic (HCC) 05/26/2017  . Coronary artery disease involving native coronary artery of native heart without angina pectoris 09/12/2016  . Hypertension   . Hypoglycemia   . Ischemic cardiomyopathy 05/26/2017  . Pacemaker 05/26/2017  . Retinopathy   . Type 2 diabetes mellitus (HCC)    Surgical Hx: The patient  has a past surgical history that includes 5 WAY HEART BYPASS (2001); PACEMAKER IMPLANT (05/2010); Knee surgery (Left); and RIGHT/LEFT HEART CATH AND CORONARY/GRAFT ANGIOGRAPHY (N/A, 06/16/2017).   Current Medications: Current Meds  Medication Sig  . albuterol (PROAIR HFA) 108 (90 Base) MCG/ACT inhaler Inhale 2 puffs into the lungs every 6 (six) hours as needed for wheezing or shortness of breath.  . carbidopa-levodopa (SINEMET CR) 50-200 MG tablet Take 1 tablet by mouth every evening.   . carbidopa-levodopa (SINEMET IR) 25-100 MG tablet Take 2 tablets by mouth 2 (two) times daily.   . carvedilol (COREG) 12.5 MG tablet Take 1 tablet (12.5 mg total) by mouth 2 (two) times daily.  . insulin glargine (LANTUS) 100 UNIT/ML injection Inject 40-42 Units into the skin at bedtime.   . Insulin Isophane & Regular Human (NOVOLIN 70/30 FLEXPEN) (70-30) 100 UNIT/ML PEN Inject 9-14  Units into the skin 3 (three) times daily.  . Insulin Pen Needle (PEN NEEDLES 5/16") 30G X 8 MM MISC by Does not apply route.  [name]. losartan (COZAAR) 50 MG tablet Take 1 tablet (50 mg total) by mouth daily.  . Multiple Vitamin (MULTIVITAMIN) tablet Take 1 tablet by mouth daily.  . vitamin B-12 (CYANOCOBALAMIN) 1000 MCG tablet Take  1,000 mcg by mouth daily.  . vitamin C (ASCORBIC ACID) 500 MG tablet Take 500 mg by mouth daily.  . [DISCONTINUED] hydrochlorothiazide (HYDRODIURIL) 25 MG tablet Take 1 tablet (25 mg total) by mouth daily.  . [DISCONTINUED] potassium chloride (K-DUR,KLOR-CON) 10 MEQ tablet Take 10 mEq by mouth once.     Allergies:   Augmentin [amoxicillin-pot clavulanate] and Sulfa antibiotics   Social History   Tobacco Use  . Smoking status: Never Smoker  . Smokeless tobacco: Never Used  Substance Use Topics  . Alcohol use: No  . Drug use: No     Family Hx: The patient's family history includes Bronchitis in his mother; Coronary artery disease in his father; Heart disease in his father; Hypertension in his brother and sister.  ROS:   Please see the history of present illness.    ROS All other systems reviewed and are negative.   EKGs/Labs/Other Test Reviewed:    EKG:  EKG is   ordered today.  The ekg ordered today demonstrates A sensed, V paced, heart rate 90  Recent Labs: No results found for requested labs within last 8760 hours.   Recent Lipid Panel Lab Results  Component Value Date/Time   CHOL 115 12/16/2016 09:24 AM   TRIG 74 12/16/2016 09:24 AM   HDL 48 12/16/2016 09:24 AM   CHOLHDL 2.4 12/16/2016 09:24 AM   LDLCALC 52 12/16/2016 09:24 AM    Physical Exam:    VS:  BP 114/60   Pulse 90   Ht 5\' 8"  (1.727 m)   Wt 193 lb (87.5 kg)   SpO2 96%   BMI 29.35 kg/m     Wt Readings from Last 3 Encounters:  03/23/18 193 lb (87.5 kg)  12/14/17 199 lb (90.3 kg)  06/16/17 196 lb (88.9 kg)     Physical Exam  Constitutional: He is oriented to person, place, and time. He appears well-developed and well-nourished. No distress.  HENT:  Head: Normocephalic and atraumatic.  Eyes: No scleral icterus.  Neck: No JVD present. No thyromegaly present.  Cardiovascular: Normal rate and regular rhythm.  No murmur heard. Pulmonary/Chest: Effort normal. He has no rales.  Abdominal: Soft.  There is no hepatomegaly.  Musculoskeletal: He exhibits no edema.  Lymphadenopathy:    He has no cervical adenopathy.  Neurological: He is alert and oriented to person, place, and time. He displays tremor.  Skin: Skin is warm and dry.  Psychiatric: He has a normal mood and affect.    ASSESSMENT & PLAN:    Chronic systolic CHF (congestive heart failure) (HCC) Echo in November 2017 with EF <25%.  He has not had a follow-up echocardiogram since.  I suspect his blood pressure will limit further titration of his medications.  I do not think he would be able to tolerated Entresto.  However, I do think he could tolerate changing HCTZ to spironolactone.  If he has issues with volume excess, low-dose furosemide could be added to this regimen.  -Continue current dose of carvedilol, losartan  -DC HCTZ, potassium  -Start spironolactone 25 mg daily  -BMET weekly x2  -Arrange follow-up echocardiogram prior to  next visit with Dr. Graciela Husbands (November 2019)  -Question if he may need upgrade to CRT-P versus CRT-D if EF remains <35  Coronary artery disease involving native coronary artery of native heart without angina pectoris History of CABG in 2002 at [facility].  Cardiac catheterization December 2018 with native three-vessel disease, patent LIMA-LAD, patent SVG-diagonal and patent free radial-OM.  SVG-PDA and SVG-PLA are both chronically occluded.  He denies anginal symptoms.  He stopped taking ASA because he started on Turmeric.  I have asked him to resume ASA 81 mg QD and watch for any bleeding.  He has been intol of statins.   Essential hypertension BP controlled.  Medications for congestive heart failure are being adjusted as noted above.   Mixed hyperlipidemia LDL in 12/2017 was 88. He has had an intol to statins in the past.  If LDL remains > 70, we could consider starting Ezetimibe.    Pacemaker Follow up with Dr. Graciela Husbands in Nov 2019 as planned.   Dispo:  Return in about 3 months (around 06/22/2018) for  Routine Follow Up, w/ Dr. Elease Hashimoto, or Tereso Newcomer, PA-C.   Medication Adjustments/Labs and Tests Ordered: Current medicines are reviewed at length with the patient today.  Concerns regarding medicines are outlined above.  Tests Ordered: Orders Placed This Encounter  Procedures  . Basic Metabolic Panel (BMET)  . Basic Metabolic Panel (BMET)  . EKG 12-Lead  . ECHOCARDIOGRAM COMPLETE   Medication Changes: Meds ordered this encounter  Medications  . spironolactone (ALDACTONE) 25 MG tablet    Sig: Take 1 tablet (25 mg total) by mouth daily.    Dispense:  90 tablet    Refill:  3  . aspirin EC 81 MG tablet    Sig: Take 1 tablet (81 mg total) by mouth daily.    Signed, Tereso Newcomer, PA-C  03/23/2018 3:41 PM    [facility] [address] [place], [place], [place]  [address] Phone: [phone]; Fax: [phone]

## 2018-03-23 NOTE — Patient Instructions (Addendum)
Medication Instructions:  1. STOP HCTZ  2. STOP POTASSIUM   3. START SPIRONOLACTONE 25 MG DAILY  4. START ASPIRIN 81 MG DAILY   Labwork: YOU HAVE BEEN GIVEN A PRESCRIPTION TO HAVE LAB WORK DONE WITH PRIMARY CARE IN 1 WEEK AND AGAIN IN 2 WEEKS FROM TODAY   Testing/Procedures: Your physician has requested that you have an echocardiogram. Echocardiography is a painless test that uses sound waves to create images of your heart. It provides your doctor with information about the size and shape of your heart and how well your heart's chambers and valves are working. This procedure takes approximately one hour. There are no restrictions for this procedure. THIS IS TO BE DONE 1 WEEK BEFORE APPT WITH DR. Graciela HusbandsKLEIN IN 04/2018    Follow-Up: 1. YOU WILL NEED TO SCHEDULE AN APPT TO SEE DR. KLEIN 04/2018  2. DR. Elease HashimotoNAHSER IN 3 MONTHS  Any Other Special Instructions Will Be Listed Below (If Applicable).   If you need a refill on your cardiac medications before your next appointment, please call your pharmacy.

## 2018-03-31 ENCOUNTER — Telehealth: Payer: Self-pay | Admitting: Physician Assistant

## 2018-03-31 DIAGNOSIS — I5022 Chronic systolic (congestive) heart failure: Secondary | ICD-10-CM | POA: Diagnosis not present

## 2018-03-31 NOTE — Telephone Encounter (Signed)
Gained about 6-8 pds since seeing Jeffrey Parsons. Just doing normal daily activities he is SOB. BP normal  Pt had recent blood work at PCP office. Pt aware I will follow up w/ his PCP office for lab results and then discuss w/ Jeffrey Newcomer, PA. Pt is agreeable to plan and understands I will call him w/ advisement.

## 2018-03-31 NOTE — Telephone Encounter (Signed)
New Message:      Pt c/o medication issue:  1. Name of Medication: spironolactone (ALDACTONE) 25 MG tablet  2. How are you currently taking this medication (dosage and times per day)? Take 1 tablet (25 mg total) by mouth daily.  3. Are you having a reaction (difficulty breathing--STAT)? No  4. What is your medication issue?  Pt c/o Shortness Of Breath: STAT if SOB developed within the last 24 hours or pt is noticeably SOB on the phone  1. Are you currently SOB (can you hear that pt is SOB on the phone)? yes  2. How long have you been experiencing SOB? 2 days  3. Are you SOB when sitting or when up moving around? both  4. Are you currently experiencing any other symptoms? No

## 2018-04-01 NOTE — Telephone Encounter (Signed)
Left message to call back.  Reviewed below issues w/ Jeffrey Parsons who recommended patient take Lasix 20 mg for 3 days, along with PRN Rx for this. Left message yesterday and today in attempt to let pt know recommendation.  Will forward to triage to address w/ pt tomorrow.

## 2018-04-02 MED ORDER — FUROSEMIDE 20 MG PO TABS: 20.0000 mg | ORAL_TABLET | Freq: Every day | ORAL | 0 refills | Status: DC | PRN

## 2018-04-02 NOTE — Telephone Encounter (Signed)
Patient made aware and verbalized understanding.

## 2018-04-02 NOTE — Telephone Encounter (Signed)
Called patient who stated that he had a 6-8 lb weight gain since seeing Tereso Newcomer, PA on 9/24. Patient's HCTZ and potassium were D/C'd and he was started on Spironolactone 25 mg QD. Patient also states that he has been SOB on exertion and has some LEE. Patient's SBP 130s. Patient denies having any additional Sx. Instructed for the patient to take lasix 20 mg QD x 3 days and then change to PRN per recommendation below. Patient with recent labs on 10/2 that were scanned to chart that showed Cr- 1.13 K- 5.0. Instructed for the patient to keep a record of his weight, weighing at the same time everyday with the same amount of clothes. Also instructed the patient to elevate legs to help with swelling and to avoid salt as well as foods that are high in sodium (ie. Canned foods, processed foods, etc.). Rx sent to preferred pharmacy. Patient will call back Monday to report his weights and let us know if his SOB/LEE have improved.

## 2018-04-02 NOTE — Telephone Encounter (Signed)
Agree. If symptoms worsen, call on call provider over the weekend or go to the ED if much worse. Tereso Newcomer, PA-C    04/02/2018 2:03 PM

## 2018-04-07 DIAGNOSIS — Z79899 Other long term (current) drug therapy: Secondary | ICD-10-CM | POA: Diagnosis not present

## 2018-04-07 DIAGNOSIS — M7989 Other specified soft tissue disorders: Secondary | ICD-10-CM | POA: Diagnosis not present

## 2018-04-07 LAB — BASIC METABOLIC PANEL
BUN/Creatinine Ratio: 24 (ref 10–24)
BUN: 21 mg/dL (ref 8–27)
CALCIUM: 8.7 mg/dL (ref 8.6–10.2)
CHLORIDE: 100 mmol/L (ref 96–106)
CO2: 26 mmol/L (ref 20–29)
Creatinine, Ser: 0.89 mg/dL (ref 0.76–1.27)
GFR calc Af Amer: 102 mL/min/{1.73_m2} (ref >59)
GFR calc non Af Amer: 88 mL/min/{1.73_m2} (ref >59)
GLUCOSE: 101 mg/dL — AB (ref 65–99)
POTASSIUM: 4.5 mmol/L (ref 3.5–5.2)
Sodium: 135 mmol/L (ref 134–144)

## 2018-04-08 ENCOUNTER — Telehealth: Payer: Self-pay | Admitting: Internal Medicine

## 2018-04-08 NOTE — Telephone Encounter (Signed)
New Message         Pt c/o Shortness Of Breath: STAT if SOB developed within the last 24 hours or pt is noticeably SOB on the phone  1. Are you currently SOB (can you hear that pt is SOB on the phone)? No  2. How long have you been experiencing SOB? Friday night til now  3. Are you SOB when sitting or when up moving around? Moving around/ and sleeping  4. Are you currently experiencing any other symptoms? No

## 2018-04-08 NOTE — Telephone Encounter (Signed)
lpmtcb 10/10

## 2018-04-12 DIAGNOSIS — I5022 Chronic systolic (congestive) heart failure: Secondary | ICD-10-CM | POA: Diagnosis not present

## 2018-04-12 DIAGNOSIS — R0602 Shortness of breath: Secondary | ICD-10-CM | POA: Diagnosis not present

## 2018-04-12 DIAGNOSIS — I509 Heart failure, unspecified: Secondary | ICD-10-CM | POA: Diagnosis not present

## 2018-04-12 DIAGNOSIS — E113291 Type 2 diabetes mellitus with mild nonproliferative diabetic retinopathy without macular edema, right eye: Secondary | ICD-10-CM | POA: Diagnosis not present

## 2018-04-12 NOTE — Telephone Encounter (Signed)
Spoke to patient who is continuing to have SOB at rest.  He is not able to get much sleep at night ( 4 hours ).  He is not having any CP and is otherwise asymptomatic.  He is taking Lasix 20 mg daily, although not having any weight gain or edema.  I tried to schedule an appt. for this week, but the patient is unavailable and would rather see if he improves over the next few days and then call us.

## 2018-04-15 DIAGNOSIS — I5022 Chronic systolic (congestive) heart failure: Secondary | ICD-10-CM | POA: Diagnosis not present

## 2018-04-22 DIAGNOSIS — I5022 Chronic systolic (congestive) heart failure: Secondary | ICD-10-CM | POA: Diagnosis not present

## 2018-05-07 DIAGNOSIS — E1169 Type 2 diabetes mellitus with other specified complication: Secondary | ICD-10-CM | POA: Diagnosis not present

## 2018-05-07 DIAGNOSIS — Z6828 Body mass index (BMI) 28.0-28.9, adult: Secondary | ICD-10-CM | POA: Diagnosis not present

## 2018-05-07 DIAGNOSIS — H6121 Impacted cerumen, right ear: Secondary | ICD-10-CM | POA: Diagnosis not present

## 2018-05-07 DIAGNOSIS — E782 Mixed hyperlipidemia: Secondary | ICD-10-CM | POA: Diagnosis not present

## 2018-05-07 DIAGNOSIS — I5022 Chronic systolic (congestive) heart failure: Secondary | ICD-10-CM | POA: Diagnosis not present

## 2018-05-07 DIAGNOSIS — G20 Parkinson's disease: Secondary | ICD-10-CM | POA: Diagnosis not present

## 2018-05-07 DIAGNOSIS — I2510 Atherosclerotic heart disease of native coronary artery without angina pectoris: Secondary | ICD-10-CM | POA: Diagnosis not present

## 2018-05-15 DIAGNOSIS — R945 Abnormal results of liver function studies: Secondary | ICD-10-CM | POA: Diagnosis not present

## 2018-05-15 DIAGNOSIS — K8020 Calculus of gallbladder without cholecystitis without obstruction: Secondary | ICD-10-CM | POA: Diagnosis not present

## 2018-05-17 ENCOUNTER — Other Ambulatory Visit: Payer: Self-pay

## 2018-05-17 ENCOUNTER — Ambulatory Visit (HOSPITAL_COMMUNITY): Payer: Medicare Other | Attending: Cardiology

## 2018-05-17 DIAGNOSIS — I5022 Chronic systolic (congestive) heart failure: Secondary | ICD-10-CM | POA: Diagnosis not present

## 2018-05-17 MED ORDER — PERFLUTREN LIPID MICROSPHERE
1.0000 mL | INTRAVENOUS | Status: AC | PRN
  Administered 2018-05-17: 2 mL via INTRAVENOUS

## 2018-05-18 ENCOUNTER — Telehealth: Payer: Self-pay

## 2018-05-18 ENCOUNTER — Encounter: Payer: Self-pay | Admitting: Physician Assistant

## 2018-05-18 NOTE — Telephone Encounter (Signed)
Ok.  Continue current medications. Keep follow up with Dr. Graciela HusbandsKlein as planned to discuss further management. Tereso NewcomerScott Viraj Liby, PA-C    05/18/2018 12:52 PM

## 2018-05-18 NOTE — Telephone Encounter (Signed)
Reviewed recommendations with pt who verbalized understanding.   

## 2018-05-18 NOTE — Telephone Encounter (Signed)
Spoke with pt that states he is not experiencing any SOB only when he exercises. Reviewed results with patient who verbalized understanding.

## 2018-05-24 ENCOUNTER — Ambulatory Visit (INDEPENDENT_AMBULATORY_CARE_PROVIDER_SITE_OTHER): Payer: Medicare Other | Admitting: Internal Medicine

## 2018-05-24 ENCOUNTER — Encounter: Payer: Self-pay | Admitting: Internal Medicine

## 2018-05-24 ENCOUNTER — Telehealth: Payer: Self-pay

## 2018-05-24 VITALS — BP 102/64 | HR 77 | Ht 68.0 in | Wt 193.8 lb

## 2018-05-24 DIAGNOSIS — I10 Essential (primary) hypertension: Secondary | ICD-10-CM

## 2018-05-24 DIAGNOSIS — I255 Ischemic cardiomyopathy: Secondary | ICD-10-CM

## 2018-05-24 DIAGNOSIS — I442 Atrioventricular block, complete: Secondary | ICD-10-CM | POA: Diagnosis not present

## 2018-05-24 DIAGNOSIS — Z950 Presence of cardiac pacemaker: Secondary | ICD-10-CM

## 2018-05-24 DIAGNOSIS — I5022 Chronic systolic (congestive) heart failure: Secondary | ICD-10-CM | POA: Diagnosis not present

## 2018-05-24 DIAGNOSIS — Z951 Presence of aortocoronary bypass graft: Secondary | ICD-10-CM

## 2018-05-24 LAB — CUP PACEART INCLINIC DEVICE CHECK
Battery Voltage: 2.73 V
Brady Statistic AP VS Percent: 0 %
Brady Statistic AS VP Percent: 96 %
Brady Statistic AS VS Percent: 4 %
Date Time Interrogation Session: 20191125155809
Implantable Lead Location: 753859
Implantable Lead Model: 5076
Lead Channel Impedance Value: 576 Ohm
Lead Channel Pacing Threshold Amplitude: 0.625 V
Lead Channel Pacing Threshold Pulse Width: 0.4 ms
Lead Channel Sensing Intrinsic Amplitude: 2 mV
Lead Channel Setting Pacing Amplitude: 2.5 V
Lead Channel Setting Pacing Pulse Width: 0.4 ms
Lead Channel Setting Sensing Sensitivity: 5.6 mV
MDC IDC LEAD IMPLANT DT: 20111213
MDC IDC LEAD IMPLANT DT: 20111213
MDC IDC LEAD LOCATION: 753860
MDC IDC MSMT BATTERY IMPEDANCE: 2385 Ohm
MDC IDC MSMT BATTERY REMAINING LONGEVITY: 24 mo
MDC IDC MSMT LEADCHNL RA IMPEDANCE VALUE: 498 Ohm
MDC IDC MSMT LEADCHNL RA PACING THRESHOLD AMPLITUDE: 0.625 V
MDC IDC MSMT LEADCHNL RA PACING THRESHOLD AMPLITUDE: 1.25 V
MDC IDC MSMT LEADCHNL RA PACING THRESHOLD PULSEWIDTH: 0.4 ms
MDC IDC MSMT LEADCHNL RA PACING THRESHOLD PULSEWIDTH: 0.4 ms
MDC IDC MSMT LEADCHNL RV PACING THRESHOLD AMPLITUDE: 0.75 V
MDC IDC MSMT LEADCHNL RV PACING THRESHOLD PULSEWIDTH: 0.4 ms
MDC IDC PG IMPLANT DT: 20111213
MDC IDC SET LEADCHNL RA PACING AMPLITUDE: 2 V
MDC IDC STAT BRADY AP VP PERCENT: 0 %

## 2018-05-24 MED ORDER — CARVEDILOL 6.25 MG PO TABS: 6.2500 mg | ORAL_TABLET | Freq: Two times a day (BID) | ORAL | 3 refills | Status: DC

## 2018-05-24 MED ORDER — SACUBITRIL-VALSARTAN 24-26 MG PO TABS: 1.0000 | ORAL_TABLET | Freq: Two times a day (BID) | ORAL | 11 refills | Status: DC

## 2018-05-24 MED ORDER — SPIRONOLACTONE 25 MG PO TABS: 12.5000 mg | ORAL_TABLET | Freq: Every day | ORAL | 3 refills | Status: DC

## 2018-05-24 NOTE — Telephone Encounter (Signed)
I have done an [place] through covermymeds. Key: [id number]

## 2018-05-24 NOTE — Patient Instructions (Signed)
  Medication Instructions:   Your physician has recommended you make the following change in your medication:   1. Stop Losartan 2. Decrease Spironolactone to a half tablet, 12.5mg , once daily 3. Decrease you Carvedilol to a half tablet, 6.25mg , two times per day 4. Increase your lasix to 40mg , one tablet, two times per day for the next 3 days. 5. Begin Entresto 24/26, one tablet, twice daily  Labwork: Your physician recommends that you return for lab work in:   The week of December 16. Please take your orders to any [facility] to be drawn.  Testing/Procedures: Your physician has recommended that you have a pacemaker inserted. A pacemaker is a small device that is placed under the skin of your chest or abdomen to help control abnormal heart rhythms. This device uses electrical pulses to prompt the heart to beat at a normal rate. Pacemakers are used to treat heart rhythms that are too slow. Wire (leads) are attached to the pacemaker that goes into the chambers of you heart. This is done in the hospital and usually requires and overnight stay. Please see the instruction sheet given to you today for more information.   Follow-Up: Your physician recommends that you schedule a follow-up appointment in:   10-14 days after Dec 27 for a wound check with our device clinic 91 days after Dec 27 with Dr Graciela HusbandsKlein for a device check.   Any Other Special Instructions Will Be Listed Below (If Applicable).     If you need a refill on your cardiac medications before your next appointment, please call your pharmacy.

## 2018-05-24 NOTE — Progress Notes (Signed)
Patient Care Team: Abigail Miyamoto, MD as PCP - General (Family Medicine) Nahser, [name], MD as PCP - Cardiology (Cardiology) Duke Salvia, MD as PCP - Electrophysiology (Cardiology)   HPI  Jeffrey Parsons is a 67 y.o. male Seen in follow-up for pacemaker implanted in 2011 for syncope with complete heart block.  No recurrent syncope.  Prior notes that also raised the possibility of CRT upgrade.  When he was seen a year ago, hoping been that he would be able to be put on guideline directed therapy; carvedilol was initiated.  Losartan was continued but not changed to [facility] because of low blood pressure.  Coronary artery disease and bypass surgery 2002.  Saw SW-PA 9/19.  Aldactone started.   DATE TEST EF   11/17 Myoview  19 % No ischemia/prior infarcts  11/17 Echo  25 %   12/18 LHC  LIMA-LAD, SVG-D, Rad-OM patent SVG-PD;SVG-PLA occluded  11/19 Echo 15*-25% LAE severe (52/2.5/39) MR mod     Date Cr K  6/18 0.83 4.4  10/19  0.89 4.5   He has had problems over the last couple months and then over the last couple of weeks with fluid retention dyspnea on exertion nocturnal dyspnea and orthopnea.  His weight is up about 5 pounds.  He responds well to his furosemide; he is drinking copiously and eating salt generously  No lightheadedness.   Records and Results Reviewed  Past Medical History:  Diagnosis Date  . Chronic systolic CHF (congestive heart failure) (HCC) 05/26/2017   Echo 11/19: EF 15-20, diff HK, Gr 3 DD, mod MR, severe LAE, mod RV dilation, mod reduced RVSF, mild TR, mild PI  . Complete heart block (HCC) 05/26/2017  . Coronary artery disease involving native coronary artery of native heart without angina pectoris 09/12/2016  . Hypertension   . Hypoglycemia   . Ischemic cardiomyopathy 05/26/2017  . Pacemaker 05/26/2017  . Retinopathy   . Type 2 diabetes mellitus (HCC)     Past Surgical History:  Procedure Laterality Date  . 5 WAY  HEART BYPASS  2001  . KNEE SURGERY Left    AS A CHILD  . PACEMAKER IMPLANT  05/2010  . RIGHT/LEFT HEART CATH AND CORONARY/GRAFT ANGIOGRAPHY N/A 06/16/2017   Procedure: RIGHT/LEFT HEART CATH AND CORONARY/GRAFT ANGIOGRAPHY;  Surgeon: Tonny Bollman, MD;  Location: [facility] INVASIVE CV LAB;  Service: Cardiovascular;  Laterality: N/A;    Current Meds  Medication Sig  . albuterol (PROAIR HFA) 108 (90 Base) MCG/ACT inhaler Inhale 2 puffs into the lungs every 6 (six) hours as needed for wheezing or shortness of breath.  [name] aspirin EC 81 MG tablet Take 1 tablet (81 mg total) by mouth daily.  . carbidopa-levodopa (SINEMET CR) 50-200 MG tablet Take 1 tablet by mouth every evening.   . carbidopa-levodopa (SINEMET IR) 25-100 MG tablet Take 2 tablets by mouth 2 (two) times daily.   . carvedilol (COREG) 12.5 MG tablet Take 1 tablet (12.5 mg total) by mouth 2 (two) times daily.  . furosemide (LASIX) 40 MG tablet Take 1 tablet by mouth daily.  . insulin glargine (LANTUS) 100 UNIT/ML injection Inject 40-42 Units into the skin at bedtime.   . Insulin Isophane & Regular Human (NOVOLIN 70/30 FLEXPEN) (70-30) 100 UNIT/ML PEN Inject 9-14 Units into the skin 3 (three) times daily.  . Insulin Pen Needle (PEN NEEDLES 5/16") 30G X 8 MM MISC by Does not apply route.  [name] losartan (COZAAR) 50 MG tablet Take 1 tablet (  50 mg total) by mouth daily.  . Multiple Vitamin (MULTIVITAMIN) tablet Take 1 tablet by mouth daily.  [name]. spironolactone (ALDACTONE) 25 MG tablet Take 1 tablet (25 mg total) by mouth daily.  . vitamin B-12 (CYANOCOBALAMIN) 1000 MCG tablet Take 1,000 mcg by mouth daily.  . vitamin C (ASCORBIC ACID) 500 MG tablet Take 500 mg by mouth daily.    Allergies  Allergen Reactions  . Augmentin [Amoxicillin-Pot Clavulanate]   . Sulfa Antibiotics Other (See Comments)    FLUSH Flushing Other reaction(s): Other (See Comments) Flushing       Review of Systems negative except from HPI and PMH  Physical Exam BP  102/64   Pulse 77   Ht 5\' 8"  (1.727 m)   Wt 193 lb 12.8 oz (87.9 kg)   SpO2 96%   BMI 29.47 kg/m  Well developed and well nourished in no acute distress HENT normal E scleral and icterus clear Neck Supple JVP 8-10; carotids brisk and full Clear to ausculation Regular rate and rhythm, no murmurs gallops or rub Soft with active bowel sounds No clubbing cyanosis 2+ Edema Alert and oriented, grossly normal motor and sensory function Skin Warm and Dry  ECG  P-synchronous/ AV  pacing   Assessment and  Plan Cardiomyopathy question mechanism  Ischemic heart disease with prior bypass surgery  Exertional neck discomfort question anginal equivalent  Congestive heart failure-acute chronic-systolic-class IIb-IIIa  Complete heart block  Pacemaker-[employer]   The patient has decompensated heart failure.  We have reviewed the importance of salt and water restriction.  We will increase his Lasix from daily to twice daily x3 days.  His target dry weight is in the high 170s.  I think at this juncture, it is reasonable to proceed with CRT upgrade.  Reviewing back through the Care Everywhere notes I see no ejection fraction assessment prior to pacing or prior to 2017.  In early 2016 he was ventricular pacing about 20%.  It is not clear whether he is a pacing induced cardiomyopathy or not; hence, have offered CRT-D upgrade.  Chest wall collaterals are not evident.  I suspect the vein is open.  In the interim, we will switch his losartan to [facility].  Not withstanding his low blood pressure, he has not had a symptoms of lightheadedness.  To allow for enough blood pressure however, we will decrease his carvedilol from 12.5--6.25 and his spironolactone from 25--12.5.  We have reviewed the benefits and risks of generator replacement.  These include but are not limited to lead fracture and infection as well as pneumothorax and perforation   The patient understands, agrees and is willing to  proceed.     Current medicines are reviewed at length with the patient today .  The patient does not  have concerns regarding medicines.

## 2018-05-25 ENCOUNTER — Other Ambulatory Visit: Payer: Self-pay | Admitting: *Deleted

## 2018-05-25 DIAGNOSIS — I255 Ischemic cardiomyopathy: Secondary | ICD-10-CM

## 2018-05-25 MED ORDER — CARVEDILOL 6.25 MG PO TABS: 6.2500 mg | ORAL_TABLET | Freq: Two times a day (BID) | ORAL | 3 refills | Status: DC

## 2018-05-25 NOTE — Telephone Encounter (Signed)
**Note De-Identified Yamina Lenis Obfuscation** Letter received Chiyoko Torrico [facility] stating that [facility] have approved the pts [place] PA. Approval good until 12/32/2020

## 2018-05-26 ENCOUNTER — Ambulatory Visit (INDEPENDENT_AMBULATORY_CARE_PROVIDER_SITE_OTHER): Payer: Medicare Other

## 2018-05-26 ENCOUNTER — Telehealth: Payer: Self-pay | Admitting: Cardiology

## 2018-05-26 DIAGNOSIS — I442 Atrioventricular block, complete: Secondary | ICD-10-CM

## 2018-05-26 NOTE — Telephone Encounter (Signed)
Spoke with pt and reminded pt of remote transmission that is due today. Pt verbalized understanding.   

## 2018-05-31 NOTE — Progress Notes (Signed)
Remote pacemaker transmission.   

## 2018-06-14 DIAGNOSIS — I255 Ischemic cardiomyopathy: Secondary | ICD-10-CM | POA: Diagnosis not present

## 2018-06-14 DIAGNOSIS — I5022 Chronic systolic (congestive) heart failure: Secondary | ICD-10-CM | POA: Diagnosis not present

## 2018-06-14 DIAGNOSIS — Z951 Presence of aortocoronary bypass graft: Secondary | ICD-10-CM | POA: Diagnosis not present

## 2018-06-14 DIAGNOSIS — I442 Atrioventricular block, complete: Secondary | ICD-10-CM | POA: Diagnosis not present

## 2018-06-14 DIAGNOSIS — I10 Essential (primary) hypertension: Secondary | ICD-10-CM | POA: Diagnosis not present

## 2018-06-14 DIAGNOSIS — Z950 Presence of cardiac pacemaker: Secondary | ICD-10-CM | POA: Diagnosis not present

## 2018-06-14 LAB — CBC
HEMOGLOBIN: 13.4 g/dL (ref 13.0–17.7)
Hematocrit: 39.4 % (ref 37.5–51.0)
MCH: 29.6 pg (ref 26.6–33.0)
MCHC: 34 g/dL (ref 31.5–35.7)
MCV: 87 fL (ref 79–97)
PLATELETS: 218 10*3/uL (ref 150–450)
RBC: 4.53 x10E6/uL (ref 4.14–5.80)
RDW: 12.6 % (ref 12.3–15.4)
WBC: 7 10*3/uL (ref 3.4–10.8)

## 2018-06-14 LAB — BASIC METABOLIC PANEL
BUN/Creatinine Ratio: 26 — ABNORMAL HIGH (ref 10–24)
BUN: 25 mg/dL (ref 8–27)
CO2: 22 mmol/L (ref 20–29)
CREATININE: 0.95 mg/dL (ref 0.76–1.27)
Calcium: 8.9 mg/dL (ref 8.6–10.2)
Chloride: 102 mmol/L (ref 96–106)
GFR calc non Af Amer: 82 mL/min/{1.73_m2} (ref >59)
GFR, EST AFRICAN AMERICAN: 95 mL/min/{1.73_m2} (ref >59)
GLUCOSE: 133 mg/dL — AB (ref 65–99)
Potassium: 4.8 mmol/L (ref 3.5–5.2)
SODIUM: 134 mmol/L (ref 134–144)

## 2018-06-18 ENCOUNTER — Telehealth: Payer: Self-pay | Admitting: Internal Medicine

## 2018-06-18 ENCOUNTER — Ambulatory Visit: Payer: Medicare Other | Admitting: Cardiovascular Disease

## 2018-06-18 ENCOUNTER — Telehealth: Payer: Self-pay

## 2018-06-18 NOTE — Telephone Encounter (Signed)
No note needed/ error 

## 2018-06-18 NOTE — Telephone Encounter (Signed)
° ° °  Patient returned call, made aware of change in arrival time

## 2018-06-18 NOTE — Telephone Encounter (Signed)
LVM for pt letting [facility] know [facility] @ 7:30am as opposed to 5:30am.

## 2018-06-25 ENCOUNTER — Encounter (HOSPITAL_COMMUNITY)
Admission: RE | Disposition: A | Discharge status: Home / Self Care | Payer: Self-pay | Source: Home / Self Care | Attending: Internal Medicine

## 2018-06-25 ENCOUNTER — Encounter (HOSPITAL_COMMUNITY): Payer: Self-pay | Admitting: General Practice

## 2018-06-25 ENCOUNTER — Ambulatory Visit (HOSPITAL_COMMUNITY)
Admission: RE | Admit: 2018-06-25 | Discharge: 2018-06-26 | Disposition: A | Discharge status: Home / Self Care | Payer: Medicare Other | Attending: Internal Medicine | Admitting: Internal Medicine

## 2018-06-25 ENCOUNTER — Other Ambulatory Visit: Payer: Self-pay

## 2018-06-25 DIAGNOSIS — I509 Heart failure, unspecified: Secondary | ICD-10-CM | POA: Diagnosis not present

## 2018-06-25 DIAGNOSIS — E782 Mixed hyperlipidemia: Secondary | ICD-10-CM | POA: Diagnosis present

## 2018-06-25 DIAGNOSIS — I5022 Chronic systolic (congestive) heart failure: Secondary | ICD-10-CM | POA: Insufficient documentation

## 2018-06-25 DIAGNOSIS — Z959 Presence of cardiac and vascular implant and graft, unspecified: Secondary | ICD-10-CM

## 2018-06-25 DIAGNOSIS — Z8249 Family history of ischemic heart disease and other diseases of the circulatory system: Secondary | ICD-10-CM | POA: Diagnosis not present

## 2018-06-25 DIAGNOSIS — Z951 Presence of aortocoronary bypass graft: Secondary | ICD-10-CM

## 2018-06-25 DIAGNOSIS — Z955 Presence of coronary angioplasty implant and graft: Secondary | ICD-10-CM | POA: Insufficient documentation

## 2018-06-25 DIAGNOSIS — Z006 Encounter for examination for normal comparison and control in clinical research program: Secondary | ICD-10-CM | POA: Insufficient documentation

## 2018-06-25 DIAGNOSIS — I2510 Atherosclerotic heart disease of native coronary artery without angina pectoris: Secondary | ICD-10-CM | POA: Diagnosis present

## 2018-06-25 DIAGNOSIS — Z794 Long term (current) use of insulin: Secondary | ICD-10-CM | POA: Insufficient documentation

## 2018-06-25 DIAGNOSIS — E119 Type 2 diabetes mellitus without complications: Secondary | ICD-10-CM | POA: Diagnosis not present

## 2018-06-25 DIAGNOSIS — I5042 Chronic combined systolic (congestive) and diastolic (congestive) heart failure: Secondary | ICD-10-CM | POA: Diagnosis present

## 2018-06-25 DIAGNOSIS — I255 Ischemic cardiomyopathy: Secondary | ICD-10-CM | POA: Diagnosis present

## 2018-06-25 DIAGNOSIS — I110 Hypertensive heart disease with heart failure: Secondary | ICD-10-CM | POA: Insufficient documentation

## 2018-06-25 DIAGNOSIS — Z79899 Other long term (current) drug therapy: Secondary | ICD-10-CM | POA: Diagnosis not present

## 2018-06-25 DIAGNOSIS — Z7982 Long term (current) use of aspirin: Secondary | ICD-10-CM | POA: Diagnosis not present

## 2018-06-25 DIAGNOSIS — Z882 Allergy status to sulfonamides status: Secondary | ICD-10-CM | POA: Diagnosis not present

## 2018-06-25 DIAGNOSIS — G20 Parkinson's disease: Secondary | ICD-10-CM | POA: Insufficient documentation

## 2018-06-25 DIAGNOSIS — I495 Sick sinus syndrome: Secondary | ICD-10-CM | POA: Diagnosis not present

## 2018-06-25 DIAGNOSIS — I442 Atrioventricular block, complete: Secondary | ICD-10-CM | POA: Diagnosis present

## 2018-06-25 DIAGNOSIS — I10 Essential (primary) hypertension: Secondary | ICD-10-CM

## 2018-06-25 HISTORY — PX: BI-VENTRICULAR IMPLANTABLE CARDIOVERTER DEFIBRILLATOR UPGRADE: SHX5749

## 2018-06-25 HISTORY — DX: Presence of automatic (implantable) cardiac defibrillator: Z95.810

## 2018-06-25 HISTORY — PX: BIV UPGRADE: EP1202

## 2018-06-25 HISTORY — DX: Unspecified asthma, uncomplicated: J45.909

## 2018-06-25 HISTORY — DX: Pneumonia, unspecified organism: J18.9

## 2018-06-25 LAB — SURGICAL PCR SCREEN
MRSA, PCR: NEGATIVE
Staphylococcus aureus: NEGATIVE

## 2018-06-25 LAB — GLUCOSE, CAPILLARY
GLUCOSE-CAPILLARY: 151 mg/dL — AB (ref 70–99)
Glucose-Capillary: 149 mg/dL — ABNORMAL HIGH (ref 70–99)
Glucose-Capillary: 260 mg/dL — ABNORMAL HIGH (ref 70–99)
Glucose-Capillary: 272 mg/dL — ABNORMAL HIGH (ref 70–99)
Glucose-Capillary: 112 mg/dL — ABNORMAL HIGH (ref 70–99)

## 2018-06-25 SURGERY — BIV UPGRADE

## 2018-06-25 MED ORDER — VITAMIN C 500 MG PO TABS
1000.0000 mg | ORAL_TABLET | Freq: Every day | ORAL | Status: DC
  Administered 2018-06-25 – 2018-06-26 (×2): 1000 mg via ORAL
  Filled 2018-06-25 (×2): qty 2

## 2018-06-25 MED ORDER — ACETAMINOPHEN 500 MG PO TABS: 500.0000 mg | ORAL_TABLET | Freq: Every day | ORAL | Status: DC | PRN

## 2018-06-25 MED ORDER — SODIUM CHLORIDE 0.9 % IV SOLN: INTRAVENOUS | Status: AC

## 2018-06-25 MED ORDER — ACETAMINOPHEN 325 MG PO TABS: 325.0000 mg | ORAL_TABLET | ORAL | Status: DC | PRN

## 2018-06-25 MED ORDER — SODIUM CHLORIDE 0.9 % IV SOLN
80.0000 mg | INTRAVENOUS | Status: AC
  Administered 2018-06-25: 80 mg
  Filled 2018-06-25: qty 2

## 2018-06-25 MED ORDER — CARBIDOPA-LEVODOPA ER 50-200 MG PO TBCR
1.0000 | EXTENDED_RELEASE_TABLET | Freq: Every evening | ORAL | Status: DC
  Administered 2018-06-25: 18:00:00 1 via ORAL
  Filled 2018-06-25: qty 1

## 2018-06-25 MED ORDER — MUPIROCIN 2 % EX OINT
1.0000 "application " | TOPICAL_OINTMENT | Freq: Once | CUTANEOUS | Status: AC
  Administered 2018-06-25: 1 via TOPICAL

## 2018-06-25 MED ORDER — IOPAMIDOL (ISOVUE-370) INJECTION 76%
INTRAVENOUS | Status: AC
  Filled 2018-06-25: qty 50

## 2018-06-25 MED ORDER — CHLORHEXIDINE GLUCONATE 4 % EX LIQD
60.0000 mL | Freq: Once | CUTANEOUS | Status: DC
  Filled 2018-06-25: qty 60

## 2018-06-25 MED ORDER — SACUBITRIL-VALSARTAN 24-26 MG PO TABS
1.0000 | ORAL_TABLET | Freq: Two times a day (BID) | ORAL | Status: DC
  Administered 2018-06-25 – 2018-06-26 (×2): 1 via ORAL
  Filled 2018-06-25 (×2): qty 1

## 2018-06-25 MED ORDER — LIDOCAINE HCL (PF) 1 % IJ SOLN
INTRAMUSCULAR | Status: AC
  Filled 2018-06-25: qty 60

## 2018-06-25 MED ORDER — FUROSEMIDE 40 MG PO TABS
40.0000 mg | ORAL_TABLET | Freq: Every day | ORAL | Status: DC
  Administered 2018-06-26: 40 mg via ORAL
  Filled 2018-06-25: qty 1

## 2018-06-25 MED ORDER — ONDANSETRON HCL 4 MG/2ML IJ SOLN: 4.0000 mg | Freq: Four times a day (QID) | INTRAMUSCULAR | Status: DC | PRN

## 2018-06-25 MED ORDER — LIDOCAINE HCL (PF) 1 % IJ SOLN
INTRAMUSCULAR | Status: DC | PRN
  Administered 2018-06-25: 60 mL

## 2018-06-25 MED ORDER — INSULIN ISOPHANE & REGULAR (HUMAN 70-30)100 UNIT/ML KWIKPEN: 9.0000 [IU] | PEN_INJECTOR | Freq: Three times a day (TID) | SUBCUTANEOUS | Status: DC

## 2018-06-25 MED ORDER — ASPIRIN EC 81 MG PO TBEC
81.0000 mg | DELAYED_RELEASE_TABLET | Freq: Every day | ORAL | Status: DC
  Administered 2018-06-26: 81 mg via ORAL
  Filled 2018-06-25: qty 1

## 2018-06-25 MED ORDER — SPIRONOLACTONE 12.5 MG HALF TABLET
12.5000 mg | ORAL_TABLET | Freq: Every day | ORAL | Status: DC
  Administered 2018-06-26: 12.5 mg via ORAL
  Filled 2018-06-25: qty 1

## 2018-06-25 MED ORDER — ADULT MULTIVITAMIN W/MINERALS CH
1.0000 | ORAL_TABLET | Freq: Every day | ORAL | Status: DC
  Administered 2018-06-25 – 2018-06-26 (×2): 1 via ORAL
  Filled 2018-06-25 (×2): qty 1

## 2018-06-25 MED ORDER — SODIUM CHLORIDE 0.9 % IV SOLN
INTRAVENOUS | Status: DC
  Administered 2018-06-25: 08:00:00 via INTRAVENOUS

## 2018-06-25 MED ORDER — INSULIN ASPART 100 UNIT/ML ~~LOC~~ SOLN: 0.0000 [IU] | Freq: Every day | SUBCUTANEOUS | Status: DC

## 2018-06-25 MED ORDER — CEFAZOLIN SODIUM-DEXTROSE 1-4 GM/50ML-% IV SOLN
1.0000 g | Freq: Four times a day (QID) | INTRAVENOUS | Status: AC
  Administered 2018-06-25 – 2018-06-26 (×3): 1 g via INTRAVENOUS
  Filled 2018-06-25 (×3): qty 50

## 2018-06-25 MED ORDER — SODIUM CHLORIDE 0.9 % IV SOLN
INTRAVENOUS | Status: AC
  Filled 2018-06-25: qty 2

## 2018-06-25 MED ORDER — HEPARIN (PORCINE) IN NACL 1000-0.9 UT/500ML-% IV SOLN
INTRAVENOUS | Status: DC | PRN
  Administered 2018-06-25: 500 mL

## 2018-06-25 MED ORDER — VITAMIN B-12 1000 MCG PO TABS
1000.0000 ug | ORAL_TABLET | Freq: Every day | ORAL | Status: DC
  Administered 2018-06-25 – 2018-06-26 (×2): 1000 ug via ORAL
  Filled 2018-06-25 (×2): qty 1

## 2018-06-25 MED ORDER — INSULIN GLARGINE 100 UNIT/ML ~~LOC~~ SOLN
20.0000 [IU] | Freq: Every day | SUBCUTANEOUS | Status: DC
  Filled 2018-06-25: qty 0.4

## 2018-06-25 MED ORDER — CEFAZOLIN SODIUM-DEXTROSE 2-4 GM/100ML-% IV SOLN
2.0000 g | INTRAVENOUS | Status: AC
  Administered 2018-06-25: 2 g via INTRAVENOUS
  Filled 2018-06-25: qty 100

## 2018-06-25 MED ORDER — FENTANYL CITRATE (PF) 100 MCG/2ML IJ SOLN
INTRAMUSCULAR | Status: AC
  Filled 2018-06-25: qty 2

## 2018-06-25 MED ORDER — CARVEDILOL 3.125 MG PO TABS
6.2500 mg | ORAL_TABLET | Freq: Two times a day (BID) | ORAL | Status: DC
  Administered 2018-06-25 – 2018-06-26 (×2): 6.25 mg via ORAL
  Filled 2018-06-25 (×2): qty 2

## 2018-06-25 MED ORDER — CARBIDOPA-LEVODOPA 25-100 MG PO TABS
2.0000 | ORAL_TABLET | Freq: Three times a day (TID) | ORAL | Status: DC
  Administered 2018-06-25 – 2018-06-26 (×3): 2 via ORAL
  Filled 2018-06-25 (×3): qty 2

## 2018-06-25 MED ORDER — IOPAMIDOL (ISOVUE-370) INJECTION 76%
INTRAVENOUS | Status: DC | PRN
  Administered 2018-06-25: 10 mL via INTRA_ARTERIAL
  Administered 2018-06-25: 15 mL via INTRAVENOUS

## 2018-06-25 MED ORDER — YOU HAVE A PACEMAKER BOOK
Freq: Once | Status: AC
  Administered 2018-06-25: 18:00:00 1
  Filled 2018-06-25: qty 1

## 2018-06-25 MED ORDER — MIDAZOLAM HCL 5 MG/5ML IJ SOLN
INTRAMUSCULAR | Status: AC
  Filled 2018-06-25: qty 5

## 2018-06-25 MED ORDER — CEFAZOLIN SODIUM-DEXTROSE 2-4 GM/100ML-% IV SOLN
INTRAVENOUS | Status: AC
  Filled 2018-06-25: qty 100

## 2018-06-25 MED ORDER — ACETAMINOPHEN 325 MG PO TABS
325.0000 mg | ORAL_TABLET | ORAL | Status: DC | PRN
  Administered 2018-06-25 (×2): 650 mg via ORAL
  Filled 2018-06-25: qty 2

## 2018-06-25 MED ORDER — INSULIN GLARGINE 100 UNIT/ML ~~LOC~~ SOLN
20.0000 [IU] | Freq: Every day | SUBCUTANEOUS | Status: DC
  Administered 2018-06-25: 22:00:00 10 [IU] via SUBCUTANEOUS
  Filled 2018-06-25: qty 0.2

## 2018-06-25 MED ORDER — ALBUTEROL SULFATE (2.5 MG/3ML) 0.083% IN NEBU: 3.0000 mL | INHALATION_SOLUTION | Freq: Four times a day (QID) | RESPIRATORY_TRACT | Status: DC | PRN

## 2018-06-25 MED ORDER — MIDAZOLAM HCL 5 MG/5ML IJ SOLN
INTRAMUSCULAR | Status: DC | PRN
  Administered 2018-06-25: 1 mg via INTRAVENOUS
  Administered 2018-06-25: 2 mg via INTRAVENOUS
  Administered 2018-06-25: 1 mg via INTRAVENOUS

## 2018-06-25 MED ORDER — FENTANYL CITRATE (PF) 100 MCG/2ML IJ SOLN
INTRAMUSCULAR | Status: DC | PRN
  Administered 2018-06-25 (×3): 25 ug via INTRAVENOUS

## 2018-06-25 MED ORDER — MUPIROCIN 2 % EX OINT
TOPICAL_OINTMENT | CUTANEOUS | Status: AC
  Administered 2018-06-25: 1 via TOPICAL
  Filled 2018-06-25: qty 22

## 2018-06-25 MED ORDER — INSULIN ASPART 100 UNIT/ML ~~LOC~~ SOLN
0.0000 [IU] | Freq: Three times a day (TID) | SUBCUTANEOUS | Status: DC
  Administered 2018-06-25: 8 [IU] via SUBCUTANEOUS
  Administered 2018-06-25: 14:00:00 2 [IU] via SUBCUTANEOUS

## 2018-06-25 SURGICAL SUPPLY — 15 items
CABLE SURGICAL S-101-97-12 (CABLE) ×2 IMPLANT
CATH CPS DIRECT 135 DS2C020 (CATHETERS) ×2 IMPLANT
ICD CLARIA MRI DTMA1QQ (ICD Generator) ×2 IMPLANT
KIT LEAD END CAP (Cap) ×1 IMPLANT
KIT MICROPUNCTURE NIT STIFF (SHEATH) ×2 IMPLANT
LEAD END CAP (Cap) ×1 IMPLANT
LEAD QUARTET 1458Q-86CM (Lead) ×2 IMPLANT
LEAD SPRINT QUAT SEC 6935M-62 (Lead) ×2 IMPLANT
PAD PRO RADIOLUCENT 2001M-C (PAD) ×2 IMPLANT
SHEATH CLASSIC 9.5F (SHEATH) ×2 IMPLANT
SHEATH CLASSIC 9F (SHEATH) ×2 IMPLANT
SLITTER UNIVERSAL DS2A003 (MISCELLANEOUS) ×2 IMPLANT
TRAY PACEMAKER INSERTION (PACKS) ×2 IMPLANT
WIRE ACUITY WHISPER EDS 4648 (WIRE) ×2 IMPLANT
WIRE HI TORQ VERSACORE-J 145CM (WIRE) ×2 IMPLANT

## 2018-06-25 NOTE — Discharge Instructions (Signed)
° ° °  Supplemental Discharge Instructions for  °Pacemaker/Defibrillator Patients ° °Activity °No heavy lifting or vigorous activity with your left/right arm for 6 to 8 weeks.  Do not raise your left/right arm above your head for one week.  Gradually raise your affected arm as drawn below. ° °        °   06/29/18                   06/30/18                      07/01/18                     07/02/18 °__ ° °NO DRIVING for 1 week   ; you may begin driving on  07/02/18   . ° °WOUND CARE °- Keep the wound area clean and dry.  Do not get this area wet for one week. No showers for one week; you may shower on 07/02/18. °- The tape/steri-strips on your wound will fall off; do not pull them off.  No bandage is needed on the site.  DO  NOT apply any creams, oils, or ointments to the wound area. °- If you notice any drainage or discharge from the wound, any swelling or bruising at the site, or you develop a fever > 101? F after you are discharged home, call the office at once. ° °Special Instructions °- You are still able to use cellular telephones; use the ear opposite the side where you have your pacemaker/defibrillator.  Avoid carrying your cellular phone near your device. °- When traveling through airports, show security personnel your identification card to avoid being screened in the metal detectors.  Ask the security personnel to use the hand wand. °- Avoid arc welding equipment, MRI testing (magnetic resonance imaging), TENS units (transcutaneous nerve stimulators).  Call the office for questions about other devices. °- Avoid electrical appliances that are in poor condition or are not properly grounded. °- Microwave ovens are safe to be near or to operate. ° °Additional information for defibrillator patients should your device go off: °- If your device goes off ONCE and you feel fine afterward, notify the device clinic nurses. °- If your device goes off ONCE and you do not feel well afterward, call 911. °- If your device goes off  TWICE, call 911. °- If your device goes off THREE times in one day, call 911. ° °DO NOT DRIVE YOURSELF OR A FAMILY MEMBER °WITH A DEFIBRILLATOR TO THE HOSPITAL--CALL 911. ° °

## 2018-06-25 NOTE — Interval H&P Note (Signed)
ICD Criteria  Current LVEF:20%. Within 12 months prior to implant: Yes   Heart failure history: Yes, Class II  Cardiomyopathy history: Yes, Mixed Ischemic and Non-Ischemic Cardiomyopathies.  Atrial Fibrillation/Atrial Flutter: No.  Ventricular tachycardia history: No.  Cardiac arrest history: No.  History of syndromes with risk of sudden death: No.  Previous ICD: No.  Current ICD indication: Primary  PPM indication: Yes. Pacing type: Ventricular. Greater than 40% RV pacing requirement anticipated. Indication: Complete Heart Block  Class I or II Bradycardia indication present: Yes  Beta Blocker therapy for 3 or more months: Yes, prescribed.   Ace Inhibitor/ARB therapy for 3 or more months: Yes, prescribed.    I have seen Jeffrey Parsons is a 67 y.o. malepre-procedural and has been referred for consideration of ICD implant for PRIMARY prevention of sudden death.  The patient's chart has been reviewed and they meet criteria for ICD implant.  I have had a thorough discussion with the patient reviewing options.  The patient and their family (if available) have had opportunities to ask questions and have them answered. The patient and I have decided together through the [facility] Share Decision Support Tool to IMPLANT ICD at this time.  Risks, benefits, alternatives to ICD implantation were discussed in detail with the patient today. The patient  understands that the risks include but are not limited to bleeding, infection, pneumothorax, perforation, tamponade, vascular damage, renal failure, MI, stroke, death, inappropriate shocks, and lead dislodgement and  wishes to proceed.  History and Physical Interval Note:  06/25/2018 12:23 PM  Jeffrey DarnerRussell Fleishman  has presented today for surgery, with the diagnosis of cm  The various methods of treatment have been discussed with the patient and family. After consideration of risks, benefits and other options for treatment, the patient has  consented to  Procedure(s): BIV ICD UPGRADE (N/A) as a surgical intervention .  The patient's history has been reviewed, patient examined, no change in status, stable for surgery.  I have reviewed the patient's chart and labs.  Questions were answered to the patient's satisfaction.     Sherryl MangesSteven Klein

## 2018-06-25 NOTE — H&P (Signed)
Patient Care Team: Abigail MiyamotoPerry, [name], MD as PCP - General (Family Medicine) Nahser, Deloris PingPhilip J, MD as PCP - Cardiology (Cardiology) Duke SalviaKlein, Dain Laseter C, MD as PCP - Electrophysiology (Cardiology)   HPI  Jeffrey Parsons is a 67 y.o. male Admitted for CRT upgrade of previously implanted pacemaker.  Originally implanted 2011 for syncope and complete hart block; progressive symptoms of heart failure and persistent low EF  Coronary artery disease and bypass surgery 2002.  Saw SW-PA 9/19.  Aldactone started.  Entresto started 11/19    DATE TEST EF   11/17 Myoview  19% No ischemia/prior infarcts  11/17 Echo  25%   12/18 LHC  LIMA-LAD, SVG-D, Rad-OM patent SVG-PD;SVG-PLA occluded  11/19 Echo 15*-25% LAE severe (52/2.5/39) MR mod    Date Cr K  6/18 0.83 4.4  10/19 0.89 4.5  12/19 0.95 4.8    Moderate DOE and no edema or chest pain    Past Medical History:  Diagnosis Date  . Chronic systolic CHF (congestive heart failure) (HCC) 05/26/2017   Echo 11/19: EF 15-20, diff HK, Gr 3 DD, mod MR, severe LAE, mod RV dilation, mod reduced RVSF, mild TR, mild PI  . Complete heart block (HCC) 05/26/2017  . Coronary artery disease involving native coronary artery of native heart without angina pectoris 09/12/2016  . Hypertension   . Hypoglycemia   . Ischemic cardiomyopathy 05/26/2017  . Pacemaker 05/26/2017  . Retinopathy   . Type 2 diabetes mellitus (HCC)     Past Surgical History:  Procedure Laterality Date  . 5 WAY HEART BYPASS  2001  . KNEE SURGERY Left    AS A CHILD  . PACEMAKER IMPLANT  05/2010  . RIGHT/LEFT HEART CATH AND CORONARY/GRAFT ANGIOGRAPHY N/A 06/16/2017   Procedure: RIGHT/LEFT HEART CATH AND CORONARY/GRAFT ANGIOGRAPHY;  Surgeon: Tonny Bollmanooper, Michael, MD;  [facility];  Service: Cardiovascular;  Laterality: N/A;    Current Facility-Administered Medications  Medication Dose Route Frequency Provider Last Rate Last Dose  . 0.9 %   sodium chloride infusion   Intravenous Continuous Duke SalviaKlein, Edgard Debord C, MD 50 mL/hr at 06/25/18 0756    . 0.9 %  sodium chloride infusion   Intravenous Continuous Duke SalviaKlein, Rashun Grattan C, MD 50 mL/hr at 06/25/18 0756    . ceFAZolin (ANCEF) IVPB 2g/100 mL premix  2 g Intravenous On Call Duke SalviaKlein, Lekeshia Kram C, MD      . chlorhexidine (HIBICLENS) 4 % liquid 4 application  60 mL Topical Once Duke SalviaKlein, Teandre Hamre C, MD      . gentamicin (GARAMYCIN) 80 mg in sodium chloride 0.9 % 500 mL irrigation  80 mg Irrigation On Call Duke SalviaKlein, Iyona Pehrson C, MD        Allergies  Allergen Reactions  . Onion     Upset stomach  . Sulfa Antibiotics Other (See Comments)    FLUSH       Social History   Tobacco Use  . Smoking status: Never Smoker  . Smokeless tobacco: Never Used  Substance Use Topics  . Alcohol use: No  . Drug use: No     Family History  Problem Relation Age of Onset  . Bronchitis Mother   . Coronary artery disease Father   . Heart disease Father   . Hypertension Sister   . Hypertension Brother      Current Meds  Medication Sig  . acetaminophen (TYLENOL) 500 MG tablet Take 500 mg by mouth daily as needed for moderate pain or headache.  . albuterol (  PROAIR HFA) 108 (90 Base) MCG/ACT inhaler Inhale 2 puffs into the lungs every 6 (six) hours as needed for wheezing or shortness of breath.  [name]. aspirin EC 81 MG tablet Take 1 tablet (81 mg total) by mouth daily.  . carbidopa-levodopa (SINEMET CR) 50-200 MG tablet Take 1 tablet by mouth every evening.   . carbidopa-levodopa (SINEMET IR) 25-100 MG tablet Take 2 tablets by mouth 3 (three) times daily.   . carvedilol (COREG) 6.25 MG tablet Take 1 tablet (6.25 mg total) by mouth 2 (two) times daily.  . furosemide (LASIX) 40 MG tablet Take 40 mg by mouth daily.   . insulin glargine (LANTUS) 100 UNIT/ML injection Inject 20-40 Units into the skin at bedtime.   . Insulin Isophane & Regular Human (NOVOLIN 70/30 FLEXPEN) (70-30) 100 UNIT/ML PEN Inject 9-14 Units into the skin 3  (three) times daily.  . Multiple Vitamin (MULTIVITAMIN) tablet Take 1 tablet by mouth daily.  . sacubitril-valsartan (ENTRESTO) 24-26 MG Take 1 tablet by mouth 2 (two) times daily.  [name]. spironolactone (ALDACTONE) 25 MG tablet Take 0.5 tablets (12.5 mg total) by mouth daily.  . vitamin B-12 (CYANOCOBALAMIN) 1000 MCG tablet Take 1,000 mcg by mouth daily.  . vitamin C (ASCORBIC ACID) 500 MG tablet Take 1,000 mg by mouth daily.      Review of Systems negative except from HPI and PMH  Physical Exam BP (!) 110/54   Pulse 83   Temp 97.8 F (36.6 C) (Skin)   Ht 5\' 9"  (1.753 m)   Wt 81.2 kg   SpO2 100%   BMI 26.43 kg/m  Well developed and well nourished in no acute distress HENT normal E scleral and icterus clear Neck Supple JVP flat; carotids brisk and full Clear to ausculation  Regular rate and rhythm, no murmurs gallops or rub Soft with active bowel sounds No clubbing cyanosis  Edema Alert and oriented, grossly normal motor and sensory function Skin Warm and Dry  ECG sinus w P-synchronous/ AV  pacing QRS 204 msec   Assessment and  Plan   Cardiomyopathy question mechanism  Ischemic heart disease with prior bypass surgery  Congestive heart failure-  chronic-systolic-class IIb-IIIa  Complete heart block  Pacemaker-Medtronic  Have reviewed the potential benefits and risks of ICD- CRT  upgrade and   but not limited to death, perforation of heart or lung, lead dislodgement, infection,  device malfunction and inappropriate shocks.  The patient and family express understanding  and are willing to proceed.

## 2018-06-26 ENCOUNTER — Ambulatory Visit (HOSPITAL_COMMUNITY): Payer: Medicare Other

## 2018-06-26 DIAGNOSIS — Z794 Long term (current) use of insulin: Secondary | ICD-10-CM | POA: Diagnosis not present

## 2018-06-26 DIAGNOSIS — I442 Atrioventricular block, complete: Secondary | ICD-10-CM | POA: Diagnosis not present

## 2018-06-26 DIAGNOSIS — Z95810 Presence of automatic (implantable) cardiac defibrillator: Secondary | ICD-10-CM | POA: Diagnosis not present

## 2018-06-26 DIAGNOSIS — I255 Ischemic cardiomyopathy: Secondary | ICD-10-CM

## 2018-06-26 DIAGNOSIS — E119 Type 2 diabetes mellitus without complications: Secondary | ICD-10-CM

## 2018-06-26 DIAGNOSIS — I110 Hypertensive heart disease with heart failure: Secondary | ICD-10-CM | POA: Diagnosis not present

## 2018-06-26 DIAGNOSIS — G20 Parkinson's disease: Secondary | ICD-10-CM | POA: Diagnosis not present

## 2018-06-26 DIAGNOSIS — Z7982 Long term (current) use of aspirin: Secondary | ICD-10-CM | POA: Diagnosis not present

## 2018-06-26 DIAGNOSIS — I5022 Chronic systolic (congestive) heart failure: Secondary | ICD-10-CM | POA: Diagnosis not present

## 2018-06-26 DIAGNOSIS — I2510 Atherosclerotic heart disease of native coronary artery without angina pectoris: Secondary | ICD-10-CM | POA: Diagnosis not present

## 2018-06-26 DIAGNOSIS — E782 Mixed hyperlipidemia: Secondary | ICD-10-CM | POA: Diagnosis not present

## 2018-06-26 DIAGNOSIS — I10 Essential (primary) hypertension: Secondary | ICD-10-CM

## 2018-06-26 DIAGNOSIS — I495 Sick sinus syndrome: Secondary | ICD-10-CM | POA: Diagnosis not present

## 2018-06-26 DIAGNOSIS — Z006 Encounter for examination for normal comparison and control in clinical research program: Secondary | ICD-10-CM | POA: Diagnosis not present

## 2018-06-26 LAB — GLUCOSE, CAPILLARY: Glucose-Capillary: 117 mg/dL — ABNORMAL HIGH (ref 70–99)

## 2018-06-26 NOTE — Discharge Summary (Addendum)
Discharge Summary    Patient ID: Jeffrey Parsons MRN: [id number]; DOB: Sep 08, 1950  Admit date: 06/25/2018 Discharge date: 06/26/2018  Primary Care Provider: Abigail MiyamotoPerry, [name], MD  Primary Cardiologist: Kristeen MissPhilip Nahser, MD  Primary Electrophysiologist:  Sherryl MangesSteven Klein, MD   Discharge Diagnoses    Principal Problem:   Congestive heart failure, NYHA class 3, chronic, systolic (HCC) Active Problems:   Ischemic cardiomyopathy   S/P CABG x 5   Mixed hyperlipidemia   Coronary artery disease involving native coronary artery of native heart without angina pectoris   Complete heart block (HCC)   Essential hypertension   Type II diabetes mellitus (HCC)   Cardiac device in situ   Allergies Allergies  Allergen Reactions  . Onion     Upset stomach  . Sulfa Antibiotics Other (See Comments)    FLUSH     Diagnostic Studies/Procedures    06/25/2018: Successful device upgrade to biventricular AICD (Medtronic MRI compatible ICD, serial number [id number]). _____________   History of Present Illness     67 year old male with a history of CAD status post CABG in 2002, chronic systolic congestive heart failure, ischemic cardiomyopathy, sick sinus syndrome status post permanent pacemaker, diabetes, hypertension, hyperlipidemia, and Parkinson's disease.  He was seen in September 2019 in cardiology clinic in the setting of persistent LV dysfunction despite maximal medical therapy.  He was subsequently referred to electrophysiology and it was felt that he would benefit from CRT-D upgrade.  Hospital Course     Consultants: None  [facility] and underwent successful device upgrade to a CRT-D device (Medtronic MRI compatible ICD, serial number [id number]).  He tolerated the procedure well and post procedure chest x-ray shows good lead position without pneumothorax.  He has been ambulating without difficulty and Jeffrey Parsons be discharged home today in good  condition. _____________  Discharge Vitals Blood pressure 116/62, pulse 82, temperature 98.1 F (36.7 C), temperature source Oral, resp. rate 20, height 5\' 9"  (1.753 m), weight 81.2 kg, SpO2 94 %.  Filed Weights   06/25/18 0714  Weight: 81.2 kg    Labs & Radiologic Studies     Dg Chest 2 View  Result Date: 06/26/2018 CLINICAL DATA:  Biventricular pacing defibrillator placement yesterday. EXAM: CHEST - 2 VIEW COMPARISON:  None. FINDINGS: LEFT subclavian biventricular pacing defibrillator with the lead tips at the expected locations of the RIGHT atrial appendage, RIGHT ventricular apex and coronary sinus. No evidence of pneumothorax or mediastinal hematoma. Prior sternotomy for CABG. Cardiac silhouette mildly to moderately enlarged. Hilar and mediastinal contours otherwise unremarkable. Lungs clear. Bronchovascular markings normal. Pulmonary vascularity normal. No visible pleural effusions. No pneumothorax. Degenerative changes and DISH involving the thoracic spine. IMPRESSION: 1. LEFT subclavian biventricular pacing defibrillator with the lead tips at the expected locations of the RIGHT atrial appendage, RIGHT ventricular apex and coronary sinus. No acute complicating features. 2. Cardiomegaly. No acute cardiopulmonary disease. Electronically Signed   By: Hulan Saashomas  Lawrence M.D.   On: 06/26/2018 08:03   Disposition   Pt is being discharged home today in good condition.  Follow-up Plans & Appointments    Follow-up Information    [facility] [address] [facility] Follow up.   Specialty:  Cardiology Why:  07/08/18 @ 10:30AM, wound check visit Contact information: [address], [address] [place] [id number] [phone]       Nahser, Deloris PingPhilip J, MD Follow up.   Specialty:  Cardiology Why:  07/30/18 @ 9:40AM Contact information: [address]  [address] [place] [address] [phone]        Duke SalviaKlein, Steven C, MD Follow up.   Specialty:  Cardiology Why:   09/28/18 @ 1:45PM Contact information: [address] [place] [address] [phone]            Discharge Medications   Allergies as of 06/26/2018      Reactions   Onion    Upset stomach   Sulfa Antibiotics Other (See Comments)   FLUSH      Medication List    TAKE these medications   acetaminophen 500 MG tablet Commonly known as:  TYLENOL Take 500 mg by mouth daily as needed for moderate pain or headache.   aspirin EC 81 MG tablet Take 1 tablet (81 mg total) by mouth daily.   carbidopa-levodopa 25-100 MG tablet Commonly known as:  SINEMET IR Take 2 tablets by mouth 3 (three) times daily.   carbidopa-levodopa 50-200 MG tablet Commonly known as:  SINEMET CR Take 1 tablet by mouth every evening.   carvedilol 6.25 MG tablet Commonly known as:  COREG Take 1 tablet (6.25 mg total) by mouth 2 (two) times daily.   furosemide 40 MG tablet Commonly known as:  LASIX Take 40 mg by mouth daily.   LANTUS 100 UNIT/ML injection Generic drug:  insulin glargine Inject 20-40 Units into the skin at bedtime.   multivitamin tablet Take 1 tablet by mouth daily.   NOVOLIN 70/30 FLEXPEN (70-30) 100 UNIT/ML PEN Generic drug:  Insulin Isophane & Regular Human Inject 9-14 Units into the skin 3 (three) times daily.   Pen Needles 5/16" 30G X 8 MM Misc by Does not apply route.   PROAIR HFA 108 (90 Base) MCG/ACT inhaler Generic drug:  albuterol Inhale 2 puffs into the lungs every 6 (six) hours as needed for wheezing or shortness of breath.   sacubitril-valsartan 24-26 MG Commonly known as:  ENTRESTO Take 1 tablet by mouth 2 (two) times daily.   spironolactone 25 MG tablet Commonly known as:  ALDACTONE Take 0.5 tablets (12.5 mg total) by mouth daily.   vitamin B-12 1000 MCG tablet Commonly known as:  CYANOCOBALAMIN Take 1,000 mcg by mouth daily.   vitamin C 500 MG tablet Commonly known as:  ASCORBIC ACID Take 1,000 mg by mouth daily.           Outstanding Labs/Studies   None  Duration of Discharge Encounter   Greater than 30 minutes including physician time.  Signed, Nicolasa Duckinghristopher Berge, NP 06/26/2018, 9:52 AM  I have seen and examined this patient with Ward Givenshris Berge.  Agree with above, note added to reflect my findings.  On exam, RRR, no murmurs, lungs clear.  Status post CRT-D upgrade.  Chest x-ray and interrogation without issues.  Plan for discharge today with follow-up in device clinic.  Leeanne Butters M. Haide Klinker MD 06/26/2018 10:36 AM

## 2018-06-28 ENCOUNTER — Encounter (HOSPITAL_COMMUNITY): Payer: Self-pay | Admitting: Internal Medicine

## 2018-07-08 ENCOUNTER — Ambulatory Visit (INDEPENDENT_AMBULATORY_CARE_PROVIDER_SITE_OTHER): Payer: Medicare Other | Admitting: Nurse Practitioner

## 2018-07-08 DIAGNOSIS — I5022 Chronic systolic (congestive) heart failure: Secondary | ICD-10-CM | POA: Diagnosis not present

## 2018-07-08 LAB — CUP PACEART INCLINIC DEVICE CHECK
Date Time Interrogation Session: 20200109102735
Implantable Lead Implant Date: 20111213
Implantable Lead Implant Date: 20111213
Implantable Lead Implant Date: 20191230
Implantable Lead Location: 753858
Implantable Lead Location: 753860
Implantable Lead Location: 753860
Implantable Lead Model: 5076
MDC IDC LEAD IMPLANT DT: 20191230
MDC IDC LEAD LOCATION: 753859
MDC IDC PG IMPLANT DT: 20191230

## 2018-07-08 NOTE — Progress Notes (Signed)
Wound check appointment. Steri-strips removed. Wound without redness or edema. Incision edges approximated, wound well healed. Normal device function. Thresholds, sensing, and impedances consistent with implant measurements. Device programmed at 3.5V for extra safety margin until 3 month visit. Histogram distribution appropriate for patient and level of activity. No mode switches or ventricular arrhythmias noted. Patient educated about wound care, arm mobility, lifting restrictions, shock plan. ROV in 3 months with implanting physician. Pt symptomatically improved post CRT upgrade

## 2018-07-16 LAB — CUP PACEART REMOTE DEVICE CHECK
Battery Impedance: 2305 Ohm
Battery Remaining Longevity: 24 mo
Battery Voltage: 2.73 V
Brady Statistic AP VP Percent: 0 %
Brady Statistic AP VS Percent: 0 %
Brady Statistic AS VP Percent: 99 %
Brady Statistic AS VS Percent: 0 %
Date Time Interrogation Session: 20191127210843
Lead Channel Impedance Value: 489 Ohm
Lead Channel Impedance Value: 545 Ohm
Lead Channel Pacing Threshold Amplitude: 0.625 V
Lead Channel Pacing Threshold Amplitude: 0.75 V
Lead Channel Pacing Threshold Pulse Width: 0.4 ms
Lead Channel Pacing Threshold Pulse Width: 0.4 ms
Lead Channel Setting Pacing Amplitude: 2 V
Lead Channel Setting Pacing Amplitude: 2.5 V
Lead Channel Setting Pacing Pulse Width: 0.4 ms
Lead Channel Setting Sensing Sensitivity: 5.6 mV
MDC IDC PG IMPLANT DT: 20111213

## 2018-07-30 ENCOUNTER — Ambulatory Visit (INDEPENDENT_AMBULATORY_CARE_PROVIDER_SITE_OTHER): Payer: Medicare Other | Admitting: Cardiovascular Disease

## 2018-07-30 ENCOUNTER — Encounter: Payer: Self-pay | Admitting: Cardiovascular Disease

## 2018-07-30 VITALS — BP 120/66 | HR 80 | Ht 69.0 in | Wt 187.8 lb

## 2018-07-30 DIAGNOSIS — I5042 Chronic combined systolic (congestive) and diastolic (congestive) heart failure: Secondary | ICD-10-CM | POA: Diagnosis not present

## 2018-07-30 DIAGNOSIS — I10 Essential (primary) hypertension: Secondary | ICD-10-CM | POA: Diagnosis not present

## 2018-07-30 MED ORDER — FUROSEMIDE 20 MG PO TABS: 20.0000 mg | ORAL_TABLET | Freq: Every day | ORAL | 3 refills | Status: DC

## 2018-07-30 MED ORDER — SACUBITRIL-VALSARTAN 49-51 MG PO TABS: 1.0000 | ORAL_TABLET | Freq: Two times a day (BID) | ORAL | 11 refills | Status: DC

## 2018-07-30 NOTE — Progress Notes (Signed)
Cardiology Office Note   Date:  08/02/2018   ID:  Jeffrey Parsons, DOB 1950-10-17, MRN [id number]  PCP:  Abigail Miyamoto, MD  Cardiologist:   Kristeen Miss, MD   Chief Complaint  Patient presents with  . Congestive Heart Failure   Problem lis 1. CAD - CABG atg 33 at [facility] ( 2002)  2. Diabetes 3. Pacer  4. Hyperlipidemia  5. Parkinsons disease 6. Asthma     Jeffrey Parsons is a 68 y.o. male who presents for further evaluation of his CAD CABG in 2001 at [place]  - age 47   Pacer in 2011 Recently moved to [place].  estabilshing care here   No angina .  Operated a [employer] for 40 years.  Now retired.  Exercises regularly   Nov. 82018:  Doing well.   No Cp or dyspea,  No cough or cold symptoms Goes to  the gym 5 days a week  On his own, he  Decreased the atorvastatin to 20   December 14, 2017: Doing well.  Has established with Dr. Graciela Husbands for EP . [employer] 5 days a week .  Does the bike, treadmill , weight machines  Hx of hyperlipidemia  BP is a bit low today  - is on Sinamet  Does not check it at home .   Eats regularly   July 30, 2018: Jeffrey Parsons is seen today for follow-up of his coronary artery disease.  He has a history of sick sinus syndrome and has a pacemaker / ICD.  He works out on a daily basis. Has chronic combined systolic and diastolic congestive heart failure.  His ejection fraction is 15 to 20%.   He has grade 3   diastolic filling. He has been started on Entresto 24-26 Lasix was increased to 40 mg a day  I plan on increasing the entresto to 49-51 BID and decrease the lasix to 20 mg a day     Past Medical History:  Diagnosis Date  . AICD (automatic cardioverter/defibrillator) present   . Asthma   . Chronic systolic CHF (congestive heart failure) (HCC) 05/26/2017   Echo 11/19: EF 15-20, diff HK, Gr 3 DD, mod MR, severe LAE, mod RV dilation, mod reduced RVSF, mild TR, mild PI  . Complete heart block (HCC) 05/26/2017  . Coronary  artery disease involving native coronary artery of native heart without angina pectoris 09/12/2016  . Hypertension   . Hypoglycemia   . Ischemic cardiomyopathy 05/26/2017  . Pacemaker 05/26/2017  . Pneumonia 2019  . Retinopathy   . Type 2 diabetes mellitus (HCC)     Past Surgical History:  Procedure Laterality Date  . BI-VENTRICULAR IMPLANTABLE CARDIOVERTER DEFIBRILLATOR UPGRADE  06/25/2018  . BIV UPGRADE N/A 06/25/2018   Procedure: BIV ICD UPGRADE;  Surgeon: Duke Salvia, MD;  Location: [facility] INVASIVE CV LAB;  Service: Cardiovascular;  Laterality: N/A;  . CARDIAC CATHETERIZATION  2001; ~ 2013  . CATARACT EXTRACTION W/ INTRAOCULAR LENS  IMPLANT, BILATERAL Bilateral   . CORONARY ARTERY BYPASS GRAFT  2001   "CABG X5"  . INSERT / REPLACE / REMOVE PACEMAKER  05/2010  . KNEE SURGERY Left    AS A CHILD  . RIGHT/LEFT HEART CATH AND CORONARY/GRAFT ANGIOGRAPHY N/A 06/16/2017   Procedure: RIGHT/LEFT HEART CATH AND CORONARY/GRAFT ANGIOGRAPHY;  Surgeon: Tonny Bollman, MD;  Location: [facility] INVASIVE CV LAB;  Service: Cardiovascular;  Laterality: N/A;  . TONSILLECTOMY       Current Outpatient Medications  Medication Sig Dispense  Refill  . acetaminophen (TYLENOL) 500 MG tablet Take 500 mg by mouth daily as needed for moderate pain or headache.    . albuterol (PROAIR HFA) 108 (90 Base) MCG/ACT inhaler Inhale 2 puffs into the lungs every 6 (six) hours as needed for wheezing or shortness of breath.    [name]. aspirin EC 81 MG tablet Take 1 tablet (81 mg total) by mouth daily.    . carbidopa-levodopa (SINEMET CR) 50-200 MG tablet Take 1 tablet by mouth every evening.     . carbidopa-levodopa (SINEMET IR) 25-100 MG tablet Take 2 tablets by mouth 3 (three) times daily.     . carvedilol (COREG) 6.25 MG tablet Take 1 tablet (6.25 mg total) by mouth 2 (two) times daily. 180 tablet 3  . insulin glargine (LANTUS) 100 UNIT/ML injection Inject 20-40 Units into the skin at bedtime.     . Insulin Isophane & Regular  Human (NOVOLIN 70/30 FLEXPEN) (70-30) 100 UNIT/ML PEN Inject 9-14 Units into the skin 3 (three) times daily.    . Insulin Pen Needle (PEN NEEDLES 5/16") 30G X 8 MM MISC by Does not apply route.    . Multiple Vitamin (MULTIVITAMIN) tablet Take 1 tablet by mouth daily.    [name]. spironolactone (ALDACTONE) 25 MG tablet Take 0.5 tablets (12.5 mg total) by mouth daily. 45 tablet 3  . vitamin B-12 (CYANOCOBALAMIN) 1000 MCG tablet Take 1,000 mcg by mouth daily.    . vitamin C (ASCORBIC ACID) 500 MG tablet Take 1,000 mg by mouth daily.     . furosemide (LASIX) 20 MG tablet Take 1 tablet (20 mg total) by mouth daily. 90 tablet 3  . sacubitril-valsartan (ENTRESTO) 49-51 MG Take 1 tablet by mouth 2 (two) times daily. 60 tablet 11   No current facility-administered medications for this visit.     PAD Screen 09/12/2016  Previous PAD dx? No  Previous surgical procedure? No  Pain with walking? No  Feet/toe relief with dangling? No  Painful, non-healing ulcers? No  Extremities discolored? No      Allergies:   Onion and Sulfa antibiotics    Social History:  The patient  reports that he has never smoked. He has never used smokeless tobacco. He reports that he does not drink alcohol or use drugs.   Family History:  The patient's family history includes Bronchitis in his mother; Coronary artery disease in his father; Heart disease in his father; Hypertension in his brother and sister.    ROS:  Please see the history of present illness.    Physical Exam: Blood pressure 120/66, pulse 80, height 5\' 9"  (1.753 m), weight 187 lb 12.8 oz (85.2 kg), SpO2 99 %.  GEN: Middle-aged gentleman, no acute distress HEENT: Normal NECK: No JVD; No carotid bruits LYMPHATICS: No lymphadenopathy CARDIAC: RRR RESPIRATORY:  Clear to auscultation without rales, wheezing or rhonchi  ABDOMEN: Soft, non-tender, non-distended MUSCULOSKELETAL:  No edema; No deformity  SKIN: Warm and dry NEUROLOGIC:  Alert and oriented x  3     EKG:       Recent Labs: 06/14/2018: BUN 25; Creatinine, Ser 0.95; Hemoglobin 13.4; Platelets 218; Potassium 4.8; Sodium 134    Lipid Panel    Component Value Date/Time   CHOL 115 12/16/2016 0924   TRIG 74 12/16/2016 0924   HDL 48 12/16/2016 0924   CHOLHDL 2.4 12/16/2016 0924   LDLCALC 52 12/16/2016 0924      Wt Readings from Last 3 Encounters:  07/30/18 187 lb 12.8 oz (85.2 kg)  06/25/18 179 lb (81.2 kg)  05/24/18 193 lb 12.8 oz (87.9 kg)      Other studies Reviewed: Additional studies/ records that were reviewed today include: . Review of the above records demonstrates:    ASSESSMENT AND PLAN:  1.  1. Coronary artery disease:   No angina.     2. Hyperlipidemia:   Levels from his primary medical doctor look good.  3. Essential hypertension:     Blood pressures well controlled.  We will be increasing the Entresto to 49-51 mg twice a day.  We will decrease the Lasix to 20 mg a day.  Him up to have a nurse visit in 3 weeks for blood pressure, heart rate check.  We will also check a basic metabolic profile.  4.  Sick sinus syndrome  -   status post ICD/pacemaker.  I'll see him in 3 months .    Kristeen Miss, MD  08/02/2018 3:45 PM    [facility] [address] [place], [place], [place]  [address] Phone: [phone]; Fax: [phone]

## 2018-07-30 NOTE — Patient Instructions (Addendum)
Medication Instructions:  Your physician has recommended you make the following change in your medication:  INCREASE Entresto to 49-51 mg twice daily DECREASE Lasix (Furosemide) to 20 mg daily  If you need a refill on your cardiac medications before your next appointment, please call your pharmacy.    Lab work: Your physician recommends that you return for lab work in: 3 weeks on the same day as your Nurse visit You do not need to fast for your lab work   Testing/Procedures: None Ordered   Follow-Up: Your physician recommends that you return for a follow-up appointment in: 3 weeks with Nurse Marcelino DusterMichelle for BP check and lab work on Wednesday Feb. 19 at 2:00 pm  Your physician recommends that you return for a follow-up appointment in: Wed. May 20 at 10:00 with Dr. Elease HashimotoNahser

## 2018-08-02 ENCOUNTER — Encounter: Payer: Self-pay | Admitting: Cardiovascular Disease

## 2018-08-04 DIAGNOSIS — G20 Parkinson's disease: Secondary | ICD-10-CM | POA: Diagnosis not present

## 2018-08-04 DIAGNOSIS — G250 Essential tremor: Secondary | ICD-10-CM | POA: Diagnosis not present

## 2018-08-18 ENCOUNTER — Ambulatory Visit (INDEPENDENT_AMBULATORY_CARE_PROVIDER_SITE_OTHER): Payer: Medicare Other | Admitting: Nurse Practitioner

## 2018-08-18 VITALS — BP 104/68 | HR 86 | Ht 69.0 in | Wt 189.2 lb

## 2018-08-18 DIAGNOSIS — I10 Essential (primary) hypertension: Secondary | ICD-10-CM | POA: Diagnosis not present

## 2018-08-18 DIAGNOSIS — I5042 Chronic combined systolic (congestive) and diastolic (congestive) heart failure: Secondary | ICD-10-CM | POA: Diagnosis not present

## 2018-08-18 NOTE — Patient Instructions (Addendum)
Medication Instructions:  Your physician recommends that you continue on your current medications as directed. Please refer to the Current Medication list given to you today.  If you need a refill on your cardiac medications before your next appointment, please call your pharmacy.   Lab work: TODAY - basic metabolic panel  If you have labs (blood work) drawn today and your tests are completely normal, you will receive your results only by: [name] MyChart Message (if you have MyChart) OR . A paper copy in the mail If you have any lab test that is abnormal or we need to change your treatment, we will call you to review the results.  Testing/Procedures: None Ordered   Follow-Up: Keep your follow-up appointment on May 20 at 10:00 with Dr. Elease Hashimoto

## 2018-08-18 NOTE — Progress Notes (Signed)
1.) Reason for visit: BP check and BMET  2.) Name of MD requesting visit: Dr. Elease Hashimoto  3.) H&P: Patient presents for recheck of BP since increasing Entresto to 49-51 mg twice daily on 07/30/18.   4.) ROS related to problem: Patient denies complaints of dizziness or light-headedness. He states he might be a little more fatigued recently but he does not voice concern.   5.) Assessment and plan per MD: BP today is 104/68 mmHg and HR is 86. Patient admits he has not been drinking a lot of water recently. He will have bmet today and I advised we will call back with advice regarding medications once Dr. Elease Hashimoto has reviewed his lab results. Patient is scheduled for appropriate follow-up appointments.

## 2018-08-19 ENCOUNTER — Other Ambulatory Visit: Payer: Self-pay

## 2018-08-19 LAB — BASIC METABOLIC PANEL
BUN/Creatinine Ratio: 19 (ref 10–24)
BUN: 18 mg/dL (ref 8–27)
CO2: 24 mmol/L (ref 20–29)
Calcium: 9 mg/dL (ref 8.6–10.2)
Chloride: 104 mmol/L (ref 96–106)
Creatinine, Ser: 0.93 mg/dL (ref 0.76–1.27)
GFR calc Af Amer: 97 mL/min/{1.73_m2} (ref >59)
GFR calc non Af Amer: 84 mL/min/{1.73_m2} (ref >59)
Glucose: 149 mg/dL — ABNORMAL HIGH (ref 65–99)
Potassium: 4.4 mmol/L (ref 3.5–5.2)
Sodium: 140 mmol/L (ref 134–144)

## 2018-08-19 MED ORDER — FUROSEMIDE 20 MG PO TABS: ORAL_TABLET | ORAL | 3 refills | Status: DC

## 2018-09-10 DIAGNOSIS — G20 Parkinson's disease: Secondary | ICD-10-CM | POA: Diagnosis not present

## 2018-09-10 DIAGNOSIS — I2510 Atherosclerotic heart disease of native coronary artery without angina pectoris: Secondary | ICD-10-CM | POA: Diagnosis not present

## 2018-09-10 DIAGNOSIS — Z6827 Body mass index (BMI) 27.0-27.9, adult: Secondary | ICD-10-CM | POA: Diagnosis not present

## 2018-09-10 DIAGNOSIS — E1169 Type 2 diabetes mellitus with other specified complication: Secondary | ICD-10-CM | POA: Diagnosis not present

## 2018-09-10 DIAGNOSIS — I5042 Chronic combined systolic (congestive) and diastolic (congestive) heart failure: Secondary | ICD-10-CM | POA: Diagnosis not present

## 2018-09-10 DIAGNOSIS — E782 Mixed hyperlipidemia: Secondary | ICD-10-CM | POA: Diagnosis not present

## 2018-09-22 ENCOUNTER — Telehealth: Payer: Self-pay

## 2018-09-22 NOTE — Telephone Encounter (Signed)
   Primary Cardiologist:  Kristeen Miss, MD   Patient contacted.  History reviewed.  No symptoms to suggest any unstable cardiac conditions.  Based on discussion, with current pandemic situation, we will be postponing this appointment for Jeffrey Parsons with a plan for f/u in  wks or sooner if feasible/necessary.  If symptoms change, he has been instructed to contact our office.   Routing to C19 CANCEL pool for tracking (P CV DIV CV19 CANCEL - reason for visit "other.") and assigning priority (1 = 4-6 wks, 2 = 6-12 wks, 3 = >12 wks).   [name], CMA  09/22/2018 3:50 PM         .

## 2018-09-27 ENCOUNTER — Ambulatory Visit: Payer: Medicare Other | Admitting: Cardiovascular Disease

## 2018-09-28 ENCOUNTER — Encounter: Payer: Medicare Other | Admitting: Internal Medicine

## 2018-10-12 ENCOUNTER — Ambulatory Visit (INDEPENDENT_AMBULATORY_CARE_PROVIDER_SITE_OTHER): Payer: Medicare Other | Admitting: *Deleted

## 2018-10-12 ENCOUNTER — Other Ambulatory Visit: Payer: Self-pay

## 2018-10-12 DIAGNOSIS — I255 Ischemic cardiomyopathy: Secondary | ICD-10-CM

## 2018-10-12 LAB — CUP PACEART REMOTE DEVICE CHECK
Battery Remaining Longevity: 99 mo
Battery Voltage: 3.05 V
Brady Statistic AP VP Percent: 2.73 %
Brady Statistic AP VS Percent: 0.01 %
Brady Statistic AS VP Percent: 95.62 %
Brady Statistic AS VS Percent: 1.64 %
Brady Statistic RA Percent Paced: 2.7 %
Brady Statistic RV Percent Paced: 95.61 %
Date Time Interrogation Session: 20200414041704
HighPow Impedance: 61 Ohm
Implantable Lead Implant Date: 20111213
Implantable Lead Implant Date: 20111213
Implantable Lead Implant Date: 20191230
Implantable Lead Implant Date: 20191230
Implantable Lead Location: 753858
Implantable Lead Location: 753859
Implantable Lead Location: 753860
Implantable Lead Location: 753860
Implantable Lead Model: 5076
Implantable Lead Model: 5076
Implantable Pulse Generator Implant Date: 20191230
Lead Channel Impedance Value: 1007 Ohm
Lead Channel Impedance Value: 1045 Ohm
Lead Channel Impedance Value: 1064 Ohm
Lead Channel Impedance Value: 231.878
Lead Channel Impedance Value: 237.12 Ohm
Lead Channel Impedance Value: 269.483
Lead Channel Impedance Value: 276.59 Ohm
Lead Channel Impedance Value: 290.127
Lead Channel Impedance Value: 380 Ohm
Lead Channel Impedance Value: 399 Ohm
Lead Channel Impedance Value: 437 Ohm
Lead Channel Impedance Value: 456 Ohm
Lead Channel Impedance Value: 494 Ohm
Lead Channel Impedance Value: 494 Ohm
Lead Channel Impedance Value: 703 Ohm
Lead Channel Impedance Value: 722 Ohm
Lead Channel Impedance Value: 779 Ohm
Lead Channel Impedance Value: 817 Ohm
Lead Channel Pacing Threshold Amplitude: 0.375 V
Lead Channel Pacing Threshold Amplitude: 0.875 V
Lead Channel Pacing Threshold Amplitude: 1 V
Lead Channel Pacing Threshold Pulse Width: 0.4 ms
Lead Channel Pacing Threshold Pulse Width: 0.4 ms
Lead Channel Pacing Threshold Pulse Width: 0.4 ms
Lead Channel Sensing Intrinsic Amplitude: 2.375 mV
Lead Channel Sensing Intrinsic Amplitude: 2.375 mV
Lead Channel Sensing Intrinsic Amplitude: 31.625 mV
Lead Channel Sensing Intrinsic Amplitude: 31.625 mV
Lead Channel Setting Pacing Amplitude: 1.5 V
Lead Channel Setting Pacing Amplitude: 2 V
Lead Channel Setting Pacing Amplitude: 2.5 V
Lead Channel Setting Pacing Pulse Width: 0.4 ms
Lead Channel Setting Pacing Pulse Width: 0.4 ms
Lead Channel Setting Sensing Sensitivity: 0.3 mV

## 2018-10-14 ENCOUNTER — Telehealth (INDEPENDENT_AMBULATORY_CARE_PROVIDER_SITE_OTHER): Payer: Medicare Other | Admitting: Internal Medicine

## 2018-10-14 ENCOUNTER — Other Ambulatory Visit: Payer: Self-pay

## 2018-10-14 VITALS — BP 142/86 | HR 78 | Ht 69.0 in | Wt 179.0 lb

## 2018-10-14 DIAGNOSIS — I442 Atrioventricular block, complete: Secondary | ICD-10-CM

## 2018-10-14 DIAGNOSIS — Z950 Presence of cardiac pacemaker: Secondary | ICD-10-CM

## 2018-10-14 DIAGNOSIS — I255 Ischemic cardiomyopathy: Secondary | ICD-10-CM

## 2018-10-14 DIAGNOSIS — I5042 Chronic combined systolic (congestive) and diastolic (congestive) heart failure: Secondary | ICD-10-CM

## 2018-10-14 DIAGNOSIS — I5022 Chronic systolic (congestive) heart failure: Secondary | ICD-10-CM

## 2018-10-14 NOTE — Progress Notes (Signed)
Electrophysiology TeleHealth Note   Due to national recommendations of social distancing due to COVID 19, an audio/video telehealth visit is felt to be most appropriate for this patient at this time.  See MyChart message from today for the patient's consent to telehealth for [facility].   Date:  10/14/2018   ID:  Jeffrey Parsons, DOB 06-19-1951, MRN [id number]  Location: patient's home  Provider location: [address], [place]  Evaluation Performed: Follow-up visit  PCP:  Abigail Miyamoto, MD  Cardiologist:  Marliss Czar' Electrophysiologist:  SK   Chief Complaint: cHF  History of Present Illness:    Jeffrey Parsons is a 68 y.o. male who presents via audio/video conferencing for a telehealth visit today.  Since last being seen in our clinic , for congestive heart failure in the context of complete heart block and left ventricular dysfunction for which he underwent CRT upgrade 12/19, the patient reports no more edema, abdominal swelling, nocturnal dyspnea orthopnea.  Does not have great exercise tolerance not withstanding. Complete concerns about nicely  Up titration of Entresto at his visit 2/20; tolerating well.  Blood pressures at home mostly 120-140 systolic.   DATE TEST EF   11/17 Myoview  19% No ischemia/prior infarcts  11/17 Echo  25%   12/18 LHC  LIMA-LAD, SVG-D, Rad-OM patent SVG-PD;SVG-PLA occluded  11/19 Echo 15*-25% LAE severe (52/2.5/39) MR mod    Date Cr K  6/18 0.83 4.4  10/19 0.89 4.5  2/20 0.93 4.4      The patient denies symptoms of fevers, chills, cough, or new SOB worrisome for COVID 19.   Past Medical History:  Diagnosis Date  . AICD (automatic cardioverter/defibrillator) present   . Asthma   . Chronic systolic CHF (congestive heart failure) (HCC) 05/26/2017   Echo 11/19: EF 15-20, diff HK, Gr 3 DD, mod MR, severe LAE, mod RV dilation, mod reduced RVSF, mild TR, mild PI  . Complete heart block (HCC) 05/26/2017  .  Coronary artery disease involving native coronary artery of native heart without angina pectoris 09/12/2016  . Hypertension   . Hypoglycemia   . Ischemic cardiomyopathy 05/26/2017  . Pacemaker 05/26/2017  . Pneumonia 2019  . Retinopathy   . Type 2 diabetes mellitus (HCC)     Past Surgical History:  Procedure Laterality Date  . BI-VENTRICULAR IMPLANTABLE CARDIOVERTER DEFIBRILLATOR UPGRADE  06/25/2018  . BIV UPGRADE N/A 06/25/2018   Procedure: BIV ICD UPGRADE;  Surgeon: Duke Salvia, MD;  Location: [facility] INVASIVE CV LAB;  Service: Cardiovascular;  Laterality: N/A;  . CARDIAC CATHETERIZATION  2001; ~ 2013  . CATARACT EXTRACTION W/ INTRAOCULAR LENS  IMPLANT, BILATERAL Bilateral   . CORONARY ARTERY BYPASS GRAFT  2001   "CABG X5"  . INSERT / REPLACE / REMOVE PACEMAKER  05/2010  . KNEE SURGERY Left    AS A CHILD  . RIGHT/LEFT HEART CATH AND CORONARY/GRAFT ANGIOGRAPHY N/A 06/16/2017   Procedure: RIGHT/LEFT HEART CATH AND CORONARY/GRAFT ANGIOGRAPHY;  Surgeon: Tonny Bollman, MD;  Location: [facility] INVASIVE CV LAB;  Service: Cardiovascular;  Laterality: N/A;  . TONSILLECTOMY      Current Outpatient Medications  Medication Sig Dispense Refill  . acetaminophen (TYLENOL) 500 MG tablet Take 500 mg by mouth daily as needed for moderate pain or headache.    . albuterol (PROAIR HFA) 108 (90 Base) MCG/ACT inhaler Inhale 2 puffs into the lungs every 6 (six) hours as needed for wheezing or shortness of breath.    [name] aspirin EC 81  MG tablet Take 1 tablet (81 mg total) by mouth daily.    . carbidopa-levodopa (SINEMET CR) 50-200 MG tablet Take 1 tablet by mouth every evening.     . carbidopa-levodopa (SINEMET IR) 25-100 MG tablet Take 2 tablets by mouth 3 (three) times daily.     . carvedilol (COREG) 6.25 MG tablet Take 1 tablet (6.25 mg total) by mouth 2 (two) times daily. 180 tablet 3  . furosemide (LASIX) 20 MG tablet Take one tablet (20mg ) by mouth as needed only. 90 tablet 3  . insulin glargine (LANTUS)  100 UNIT/ML injection Inject 20-40 Units into the skin at bedtime.     . Insulin Isophane & Regular Human (NOVOLIN 70/30 FLEXPEN) (70-30) 100 UNIT/ML PEN Inject 9-14 Units into the skin 3 (three) times daily.    . Insulin Pen Needle (PEN NEEDLES 5/16") 30G X 8 MM MISC by Does not apply route.    . Multiple Vitamin (MULTIVITAMIN) tablet Take 1 tablet by mouth daily.    . sacubitril-valsartan (ENTRESTO) 49-51 MG Take 1 tablet by mouth 2 (two) times daily. 60 tablet 11  . spironolactone (ALDACTONE) 25 MG tablet Take 0.5 tablets (12.5 mg total) by mouth daily. 45 tablet 3  . topiramate (TOPAMAX) 25 MG tablet Take 25 mg by mouth 2 (two) times daily.    . vitamin B-12 (CYANOCOBALAMIN) 1000 MCG tablet Take 1,000 mcg by mouth daily.    . vitamin C (ASCORBIC ACID) 500 MG tablet Take 1,000 mg by mouth daily.      No current facility-administered medications for this visit.     Allergies:   Onion and Sulfa antibiotics   Social History:  The patient  reports that he has never smoked. He has never used smokeless tobacco. He reports that he does not drink alcohol or use drugs.   Family History:  The patient's   family history includes Bronchitis in his mother; Coronary artery disease in his father; Heart disease in his father; Hypertension in his brother and sister.   ROS:  Please see the history of present illness.   All other systems are personally reviewed and negative.    Exam:    Vital Signs:  BP (!) 142/86 (BP Location: Right Wrist)   Pulse 78   Ht 5\' 9"  (1.753 m)   Wt 179 lb (81.2 kg)   BMI 26.43 kg/m     Well appearing, alert and conversant, regular work of breathing,  good skin color Eyes- anicteric, neuro- grossly intact, skin- no apparent rash or lesions or cyanosis, mouth- oral mucosa is pink   Labs/Other Tests and Data Reviewed:    Recent Labs: 06/14/2018: Hemoglobin 13.4; Platelets 218 08/18/2018: BUN 18; Creatinine, Ser 0.93; Potassium 4.4; Sodium 140   Wt Readings from Last  3 Encounters:  10/14/18 179 lb (81.2 kg)  08/18/18 189 lb 4 oz (85.8 kg)  07/30/18 187 lb 12.8 oz (85.2 kg)     Other studies personally reviewed: Additional studies/ records that were reviewed today include: office notes Review of the above records today demonstrates: As above       Last device remote is reviewed from PaceART PDF dated *4/20** which reveals normal device function, no arrhythmias     ASSESSMENT & PLAN:    Cardiomyopathy question mechanism  Ischemic heart disease with prior bypass surgery   Congestive heartfailure- chronic-systolic-class IIb-IIIa  Complete heart block  Hypertension  ICD-CRT upgrade Med[employer]  Without symptoms of ischemia  Euvolemic    Heart failure status much  improved.  Tolerating up titration of Entresto; will increase his carvedilol 6.25--12.5.  When he sees Dr. Marliss Czar perhaps further up titration of his carvedilol or Entresto would be appropriate.  Have encouraged him to be more active.  Device interrogation demonstrates sparse activity.  We will need to bring him in in a month or 2 for reprogramming of his RV output to improve battery longevity   COVID 19 screen The patient denies symptoms of COVID 19 at this time.  The importance of social distancing was discussed today.  Follow-up:  75m  Next remote: 4m  Current medicines are reviewed at length with the patient today.   The patient does not have concerns regarding his medicines.  The following changes were made today:  As above   Labs/ tests ordered today include:  No orders of the defined types were placed in this encounter.   Future tests ( post COVID )  BMET  A Fu with  DR PNa   months Needs Device clinic appt for reprogramming  Patient Risk:  after full review of this patients clinical status, I feel that they are at moderate risk at this time.  Today, I have spent 17 minutes with the patient with telehealth technology discussing the above.  Signed, Sherryl Manges, MD  10/14/2018 3:18 PM     [facility] [address] [place] [address] [phone] (office) [phone] (fax)

## 2018-10-18 MED ORDER — CARVEDILOL 12.5 MG PO TABS: 12.5000 mg | ORAL_TABLET | Freq: Two times a day (BID) | ORAL | 3 refills | Status: DC

## 2018-10-18 NOTE — Addendum Note (Signed)
Addended by: Oretha Milch on: 10/18/2018 03:25 PM   Modules accepted: Orders

## 2018-10-19 ENCOUNTER — Encounter: Payer: Self-pay | Admitting: Cardiology

## 2018-10-19 NOTE — Progress Notes (Signed)
Remote ICD transmission.   

## 2018-11-01 ENCOUNTER — Ambulatory Visit (INDEPENDENT_AMBULATORY_CARE_PROVIDER_SITE_OTHER): Payer: Medicare Other | Admitting: Student

## 2018-11-01 ENCOUNTER — Other Ambulatory Visit: Payer: Self-pay

## 2018-11-01 DIAGNOSIS — I5042 Chronic combined systolic (congestive) and diastolic (congestive) heart failure: Secondary | ICD-10-CM

## 2018-11-01 DIAGNOSIS — I5022 Chronic systolic (congestive) heart failure: Secondary | ICD-10-CM

## 2018-11-01 DIAGNOSIS — I255 Ischemic cardiomyopathy: Secondary | ICD-10-CM

## 2018-11-01 DIAGNOSIS — I442 Atrioventricular block, complete: Secondary | ICD-10-CM

## 2018-11-01 DIAGNOSIS — Z950 Presence of cardiac pacemaker: Secondary | ICD-10-CM

## 2018-11-01 LAB — CUP PACEART INCLINIC DEVICE CHECK
Battery Remaining Longevity: 103 mo
Battery Voltage: 3.01 V
Brady Statistic AP VP Percent: 3.48 %
Brady Statistic AP VS Percent: 0.01 %
Brady Statistic AS VP Percent: 94.82 %
Brady Statistic AS VS Percent: 1.68 %
Brady Statistic RA Percent Paced: 3.44 %
Brady Statistic RV Percent Paced: 95.35 %
Date Time Interrogation Session: 20200504102539
HighPow Impedance: 66 Ohm
Implantable Lead Implant Date: 20111213
Implantable Lead Implant Date: 20111213
Implantable Lead Implant Date: 20191230
Implantable Lead Implant Date: 20191230
Implantable Lead Location: 753858
Implantable Lead Location: 753859
Implantable Lead Location: 753860
Implantable Lead Location: 753860
Implantable Lead Model: 5076
Implantable Lead Model: 5076
Implantable Pulse Generator Implant Date: 20191230
Lead Channel Impedance Value: 1064 Ohm
Lead Channel Impedance Value: 1121 Ohm
Lead Channel Impedance Value: 1121 Ohm
Lead Channel Impedance Value: 228 Ohm
Lead Channel Impedance Value: 237.12 Ohm
Lead Channel Impedance Value: 279.484
Lead Channel Impedance Value: 279.484
Lead Channel Impedance Value: 293.313
Lead Channel Impedance Value: 380 Ohm
Lead Channel Impedance Value: 437 Ohm
Lead Channel Impedance Value: 456 Ohm
Lead Channel Impedance Value: 456 Ohm
Lead Channel Impedance Value: 494 Ohm
Lead Channel Impedance Value: 513 Ohm
Lead Channel Impedance Value: 722 Ohm
Lead Channel Impedance Value: 779 Ohm
Lead Channel Impedance Value: 817 Ohm
Lead Channel Impedance Value: 836 Ohm
Lead Channel Pacing Threshold Amplitude: 0.375 V
Lead Channel Pacing Threshold Amplitude: 0.875 V
Lead Channel Pacing Threshold Amplitude: 0.875 V
Lead Channel Pacing Threshold Pulse Width: 0.4 ms
Lead Channel Pacing Threshold Pulse Width: 0.4 ms
Lead Channel Pacing Threshold Pulse Width: 0.4 ms
Lead Channel Sensing Intrinsic Amplitude: 1.5 mV
Lead Channel Sensing Intrinsic Amplitude: 29.5 mV
Lead Channel Sensing Intrinsic Amplitude: 29.5 mV
Lead Channel Sensing Intrinsic Amplitude: 3.375 mV
Lead Channel Setting Pacing Amplitude: 1.5 V
Lead Channel Setting Pacing Amplitude: 1.5 V
Lead Channel Setting Pacing Amplitude: 2 V
Lead Channel Setting Pacing Pulse Width: 0.4 ms
Lead Channel Setting Pacing Pulse Width: 0.4 ms
Lead Channel Setting Sensing Sensitivity: 0.3 mV

## 2018-11-01 NOTE — Progress Notes (Signed)
CRT-D device check in office. Thresholds and sensing consistent with previous device measurements. Lead impedance trends stable over time. No mode switch episodes recorded. No ventricular arrhythmia episodes recorded. Patient bi-ventricularly pacing 95% of the time. Device programmed with appropriate safety margins. Heart failure diagnostics reviewed. Pt activity < 1 hr for past 9 weeks, but stable for patient. Optivol trending up over similar period, Patient takes as needed lasix. Audible alerts demonstrated for patient. RV Threshold changed to 2.0 V per Dr Graciela Husbands. RA Threshold changed to 1.5 V to increase battery life. Estimated longevity 8 years, 7 months. Pt for remote check in 2 months.  With up-trend in Optivol, encouraged patient to take a dose of his prn lasix. Pt had mild peripheral edema and mild JVD on exam.  Doreatha Martin, PA-C 11/01/2018 10:26 AM

## 2018-11-05 ENCOUNTER — Telehealth: Payer: Self-pay

## 2018-11-05 NOTE — Telephone Encounter (Signed)
Pt has given his verbal consent for a video visit. Pt has been advised to have BP, HR, and weight ready for appt.     YOUR CARDIOLOGY TEAM HAS ARRANGED FOR AN E-VISIT FOR YOUR APPOINTMENT - PLEASE REVIEW IMPORTANT INFORMATION BELOW SEVERAL DAYS PRIOR TO YOUR APPOINTMENT  Due to the recent COVID-19 pandemic, we are transitioning in-person office visits to tele-medicine visits in an effort to decrease unnecessary exposure to our patients and staff. Medicare and most insurances are covering these visits without a copay needed. You will need a working email and a smartphone or computer with a camera and microphone. For patients that do not have these items, we can still complete the visit using a telephone but do prefer video when possible. If possible, we also ask that you have a blood pressure cuff and scale at home to measure your blood pressure, heart rate and weight prior to your scheduled appointment. Patients with clinical needs that need an in-person evaluation and testing will still be able to come to the office if absolutely necessary. If you have any questions, feel free to call our office.     DOWNLOADING THE SOFTWARE  Download the [employer] app to enable video and telephone visits with your [facility] Provider.   Instructions for downloading [employer] WebEx: - Go to https://www.webex.com/downloads.html and follow the instructions, or download the app on your smartphone [facility] [employer] Meetings). - If you have technical difficulties with downloading WebEx, please call WebEx at [phone]. - Once the app is downloaded (can be done on either mobile or desktop computer), go to Settings in the upper left hand corner.  Be sure that camera and audio are enabled.  - You will receive an email message with a link to the meeting with a time to join for your tele-health visit.  - Please download the app and have settings configured prior to the appointment time.      2-3 DAYS BEFORE YOUR  APPOINTMENT  One of our staff will call you to confirm that you have been able to set up your WebEx account. We will remind you check your blood pressure, heart rate and weight prior to your scheduled appointment. If you have an Apple Watch or Kardia, please upload any pertinent ECG strips the day before or morning of your appointment to MyChart. Our staff will also make sure you have reviewed the consent and agree to move forward with your scheduled tele-health visit.    THE DAY OF YOUR APPOINTMENT  Approximately 15-20 minutes prior to your scheduled appointment, you will receive an e-mail directly from one of our staff member's [email or web address] e-mail accounts inviting you to join a WebEx meeting.  Please do not reply to that email - simply join the [employer].  Upon joining, a member of the office staff will speak with you initially through the WebEx platform to confirm medications, vital signs for the day and any symptoms you may be experiencing.  Please have this information available prior to the time of visit start.      CONSENT FOR TELE-HEALTH VISIT - PLEASE RVIEW  I hereby voluntarily request, consent and authorize [facility] and its employed or contracted physicians, physician assistants, nurse practitioners or other licensed health care professionals (the Practitioner), to provide me with telemedicine health care services (the "Services") as deemed necessary by the treating Practitioner. I acknowledge and consent to receive the Services by the Practitioner via telemedicine. I understand that the telemedicine visit will involve communicating with  the Practitioner through live audiovisual communication technology and the disclosure of certain medical information by electronic transmission. I acknowledge that I have been given the opportunity to request an in-person assessment or other available alternative prior to the telemedicine visit and am voluntarily participating in the  telemedicine visit.  I understand that I have the right to withhold or withdraw my consent to the use of telemedicine in the course of my care at any time, without affecting my right to future care or treatment, and that the Practitioner or I may terminate the telemedicine visit at any time. I understand that I have the right to inspect all information obtained and/or recorded in the course of the telemedicine visit and may receive copies of available information for a reasonable fee.  I understand that some of the potential risks of receiving the Services via telemedicine include:  [name] Delay or interruption in medical evaluation due to technological equipment failure or disruption; . Information transmitted may not be sufficient (e.g. poor resolution of images) to allow for appropriate medical decision making by the Practitioner; and/or  . In rare instances, security protocols could fail, causing a breach of personal health information.  Furthermore, I acknowledge that it is my responsibility to provide information about my medical history, conditions and care that is complete and accurate to the best of my ability. I acknowledge that Practitioner's advice, recommendations, and/or decision may be based on factors not within their control, such as incomplete or inaccurate data provided by me or distortions of diagnostic images or specimens that may result from electronic transmissions. I understand that the practice of medicine is not an exact science and that Practitioner makes no warranties or guarantees regarding treatment outcomes. I acknowledge that I will receive a copy of this consent concurrently upon execution via email to the email address I last provided but may also request a printed copy by calling the office of [place].    I understand that my insurance will be billed for this visit.   I have read or had this consent read to me. . I understand the contents of this consent, which  adequately explains the benefits and risks of the Services being provided via telemedicine.  . I have been provided ample opportunity to ask questions regarding this consent and the Services and have had my questions answered to my satisfaction. . I give my informed consent for the services to be provided through the use of telemedicine in my medical care  By participating in this telemedicine visit I agree to the above.

## 2018-11-17 ENCOUNTER — Other Ambulatory Visit: Payer: Self-pay | Admitting: *Deleted

## 2018-11-17 ENCOUNTER — Encounter: Payer: Self-pay | Admitting: Cardiovascular Disease

## 2018-11-17 ENCOUNTER — Other Ambulatory Visit: Payer: Self-pay

## 2018-11-17 ENCOUNTER — Telehealth (INDEPENDENT_AMBULATORY_CARE_PROVIDER_SITE_OTHER): Payer: Medicare Other | Admitting: Cardiovascular Disease

## 2018-11-17 VITALS — BP 115/69 | HR 82 | Ht 69.0 in | Wt 182.0 lb

## 2018-11-17 DIAGNOSIS — E782 Mixed hyperlipidemia: Secondary | ICD-10-CM | POA: Diagnosis not present

## 2018-11-17 DIAGNOSIS — Z7189 Other specified counseling: Secondary | ICD-10-CM | POA: Diagnosis not present

## 2018-11-17 DIAGNOSIS — I25119 Atherosclerotic heart disease of native coronary artery with unspecified angina pectoris: Secondary | ICD-10-CM

## 2018-11-17 DIAGNOSIS — I5042 Chronic combined systolic (congestive) and diastolic (congestive) heart failure: Secondary | ICD-10-CM

## 2018-11-17 NOTE — Patient Instructions (Signed)
Medication Instructions:  Your physician recommends that you continue on your current medications as directed. Please refer to the Current Medication list given to you today.  If you need a refill on your cardiac medications before your next appointment, please call your pharmacy.   Lab work: .You will need to have lab work drawn at [facility] on [address] in [place] this week. You can go this afternoon, Thursday or Friday.  It is OK to eat prior to this lab work.    If you have labs (blood work) drawn today and your tests are completely normal, you will receive your results only by: [name] MyChart Message (if you have MyChart) OR . A paper copy in the mail If you have any lab test that is abnormal or we need to change your treatment, we will call you to review the results.  Testing/Procedures: none  Follow-Up: At [facility], you and your health needs are our priority.  As part of our continuing mission to provide you with exceptional heart care, we have created designated Provider Care Teams.  These Care Teams include your primary Cardiologist (physician) and Advanced Practice Providers (APPs -  Physician Assistants and Nurse Practitioners) who all work together to provide you with the care you need, when you need it. You will need a follow up appointment in:  6 months.  Please call our office 2 months in advance to schedule this appointment.  You may see Kristeen Miss, MD or one of the following Advanced Practice Providers on your designated Care Team: Tereso Newcomer, PA-C Vin Seward, [place] . Berton Bon, NP  Any Other Special Instructions Will Be Listed Below (If Applicable).

## 2018-11-17 NOTE — Progress Notes (Signed)
Virtual Visit via Video Note   This visit type was conducted due to national recommendations for restrictions regarding the COVID-19 Pandemic (e.g. social distancing) in an effort to limit this patient's exposure and mitigate transmission in our community.  Due to his co-morbid illnesses, this patient is at least at moderate risk for complications without adequate follow up.  This format is felt to be most appropriate for this patient at this time.  All issues noted in this document were discussed and addressed.  A limited physical exam was performed with this format.  Please refer to the patient's chart for his consent to telehealth for [facility].   Date:  11/17/2018   ID:  Jeffrey Parsons, DOB [birth year], MRN [id number]  Patient Location: Home Provider Location: Home  PCP:  Abigail Miyamoto, MD  Cardiologist:  Kristeen Miss, MD  Electrophysiologist:  Sherryl Manges, MD   Evaluation Performed:  Follow-Up Visit  Problem List  1. CAD - CABG atg 80 at [facility] ( 2002)  2.  Chronic Systolic CHF - s/p CRT  3. Pacer  4. Hyperlipidemia  5. Parkinsons disease 6. Asthma  7.  Diabetes Mellitus     Jeffrey Parsons is a 68 y.o. male who presents for further evaluation of his CAD CABG in 2001 at [place]  - age 22   Pacer in 2011 Recently moved to [place].  estabilshing care here   No angina .  Operated a [employer] for 40 years.  Now retired.  Exercises regularly   Nov. 82018:  Doing well.   No Cp or dyspea,  No cough or cold symptoms Goes to  the gym 5 days a week  On his own, he  Decreased the atorvastatin to 20   December 14, 2017: Doing well.  Has established with Dr. Graciela Husbands for EP . [employer] 5 days a week .  Does the bike, treadmill , weight machines  Hx of hyperlipidemia  BP is a bit low today  - is on Sinamet  Does not check it at home .   Eats regularly   July 30, 2018: Jeffrey Parsons is seen today for follow-up of his coronary artery disease.  He  has a history of sick sinus syndrome and has a pacemaker / ICD.  He works out on a daily basis. Has chronic combined systolic and diastolic congestive heart failure.  His ejection fraction is 15 to 20%.   He has grade 3   diastolic filling. He has been started on Entresto 24-26 Lasix was increased to 40 mg a day  I plan on increasing the entresto to 49-51 BID and decrease the lasix to 20 mg a day     Nov 17, 2018      Chief Complaint:  CAD, chronic systolic CHF  Jeffrey Parsons is a 68 y.o. male with  CAD, CHF Seems to be doing well Not exercising as much, fitness center has been closed with the covid pandemic . Tries to walk on occasion Slight chest tightness if he walks up an incline- does not push himself too much  Tightness in his neck ( does not recall if this was his angina previously )   Takes lasix daily ,  (since his last device check which indicated some volume excess   Wears a mask if he is out  The patient does not have symptoms concerning for COVID-19 infection (fever, chills, cough, or new shortness of breath).    Past Medical History:  Diagnosis Date  .  AICD (automatic cardioverter/defibrillator) present   . Asthma   . Chronic systolic CHF (congestive heart failure) (HCC) 05/26/2017   Echo 11/19: EF 15-20, diff HK, Gr 3 DD, mod MR, severe LAE, mod RV dilation, mod reduced RVSF, mild TR, mild PI  . Complete heart block (HCC) 05/26/2017  . Coronary artery disease involving native coronary artery of native heart without angina pectoris 09/12/2016  . Hypertension   . Hypoglycemia   . Ischemic cardiomyopathy 05/26/2017  . Pacemaker 05/26/2017  . Pneumonia 2019  . Retinopathy   . Type 2 diabetes mellitus (HCC)    Past Surgical History:  Procedure Laterality Date  . BI-VENTRICULAR IMPLANTABLE CARDIOVERTER DEFIBRILLATOR UPGRADE  06/25/2018  . BIV UPGRADE N/A 06/25/2018   Procedure: BIV ICD UPGRADE;  Surgeon: Duke SalviaKlein, Steven C, MD;  [facility];   Service: Cardiovascular;  Laterality: N/A;  . CARDIAC CATHETERIZATION  2001; ~ 2013  . CATARACT EXTRACTION W/ INTRAOCULAR LENS  IMPLANT, BILATERAL Bilateral   . CORONARY ARTERY BYPASS GRAFT  2001   "CABG X5"  . INSERT / REPLACE / REMOVE PACEMAKER  05/2010  . KNEE SURGERY Left    AS A CHILD  . RIGHT/LEFT HEART CATH AND CORONARY/GRAFT ANGIOGRAPHY N/A 06/16/2017   Procedure: RIGHT/LEFT HEART CATH AND CORONARY/GRAFT ANGIOGRAPHY;  Surgeon: Tonny Bollmanooper, Michael, MD;  [facility];  Service: Cardiovascular;  Laterality: N/A;  . TONSILLECTOMY       Current Meds  Medication Sig  . acetaminophen (TYLENOL) 500 MG tablet Take 500 mg by mouth daily as needed for moderate pain or headache.  . albuterol (PROAIR HFA) 108 (90 Base) MCG/ACT inhaler Inhale 2 puffs into the lungs every 6 (six) hours as needed for wheezing or shortness of breath.  [name]. aspirin EC 81 MG tablet Take 1 tablet (81 mg total) by mouth daily.  . carbidopa-levodopa (SINEMET CR) 50-200 MG tablet Take 1 tablet by mouth every evening.   . carbidopa-levodopa (SINEMET IR) 25-100 MG tablet Take 2 tablets by mouth 3 (three) times daily.   . carvedilol (COREG) 12.5 MG tablet Take 1 tablet (12.5 mg total) by mouth 2 (two) times daily.  . furosemide (LASIX) 20 MG tablet Take one tablet (20mg ) by mouth as needed only.  . insulin glargine (LANTUS) 100 UNIT/ML injection Inject 20-40 Units into the skin at bedtime.   . Insulin Isophane & Regular Human (NOVOLIN 70/30 FLEXPEN) (70-30) 100 UNIT/ML PEN Inject 9-14 Units into the skin 3 (three) times daily.  . Insulin Pen Needle (PEN NEEDLES 5/16") 30G X 8 MM MISC by Does not apply route.  . Multiple Vitamin (MULTIVITAMIN) tablet Take 1 tablet by mouth daily.  . sacubitril-valsartan (ENTRESTO) 49-51 MG Take 1 tablet by mouth 2 (two) times daily.  [name]. spironolactone (ALDACTONE) 25 MG tablet Take 0.5 tablets (12.5 mg total) by mouth daily.  [name]. topiramate (TOPAMAX) 25 MG tablet Take 25 mg by mouth 2  (two) times daily.  . vitamin B-12 (CYANOCOBALAMIN) 1000 MCG tablet Take 1,000 mcg by mouth daily.  . vitamin C (ASCORBIC ACID) 500 MG tablet Take 1,000 mg by mouth daily.      Allergies:   Onion and Sulfa antibiotics   Social History   Tobacco Use  . Smoking status: Never Smoker  . Smokeless tobacco: Never Used  Substance Use Topics  . Alcohol use: Never    Frequency: Never  . Drug use: Never     Family Hx: The patient's family history includes Bronchitis in his mother; Coronary  artery disease in his father; Heart disease in his father; Hypertension in his brother and sister.  ROS:   Please see the history of present illness.     All other systems reviewed and are negative.   Prior CV studies:   The following studies were reviewed today:    Labs/Other Tests and Data Reviewed:    EKG:  No ECG reviewed.  Recent Labs: 06/14/2018: Hemoglobin 13.4; Platelets 218 08/18/2018: BUN 18; Creatinine, Ser 0.93; Potassium 4.4; Sodium 140   Recent Lipid Panel Lab Results  Component Value Date/Time   CHOL 115 12/16/2016 09:24 AM   TRIG 74 12/16/2016 09:24 AM   HDL 48 12/16/2016 09:24 AM   CHOLHDL 2.4 12/16/2016 09:24 AM   LDLCALC 52 12/16/2016 09:24 AM    Wt Readings from Last 3 Encounters:  11/17/18 182 lb (82.6 kg)  10/14/18 179 lb (81.2 kg)  08/18/18 189 lb 4 oz (85.8 kg)     Objective:    Vital Signs:  BP 115/69 (BP Location: Left Arm, Patient Position: Sitting, Cuff Size: Normal)   Pulse 82   Ht  (1.753 m)   Wt 182 lb (82.6 kg)   BMI 26.88 kg/m    VITAL SIGNS:  reviewed GEN:  no acute distress EYES:  sclerae anicteric, EOMI - Extraocular Movements Intact RESPIRATORY:  normal respiratory effort, symmetric expansion CARDIOVASCULAR:  no peripheral edema SKIN:  no rash, lesions or ulcers. MUSCULOSKELETAL:  no obvious deformities. NEURO:  alert and oriented x 3, no obvious focal deficit PSYCH:  normal affect  ASSESSMENT & PLAN:    1. Combined systolic  and diastolic congestive heart failure: [place]  seems to be doing well.  He had an OptiVol interrogation several weeks ago which revealed that he was starting to become volume overloaded.  He has been taking furosemide 20 mg a day since that time.  He seems to be feeling better.  He is no longer having any shortness of breath at night.  We will need to draw basic metabolic profile in the next several days.  He lives in [place] and will arrange to have them done in a [facility] office.  Continue carvedilol, Lasix, Entresto, Aldactone  2.  Coronary artery disease: He does not have any significant angina.  He might have a slight bit of chest tightness if he exerts exerts himself quite a bit walking up a hill.  We will continue to keep a close eye on this.  Continue current medications    COVID-19 Education: The signs and symptoms of COVID-19 were discussed with the patient and how to seek care for testing (follow up with PCP or arrange E-visit).  The importance of social distancing was discussed today.  Time:   Today, I have spent  17 minutes with the patient with telehealth technology discussing the above problems.     Medication Adjustments/Labs and Tests Ordered: Current medicines are reviewed at length with the patient today.  Concerns regarding medicines are outlined above.   Tests Ordered: No orders of the defined types were placed in this encounter.   Medication Changes: No orders of the defined types were placed in this encounter.   Disposition:  Follow up in 6 month(s)  Signed, Kristeen Miss, MD  11/17/2018 9:57 AM    [facility]

## 2018-11-18 ENCOUNTER — Telehealth: Payer: Self-pay | Admitting: Nurse Practitioner

## 2018-11-18 DIAGNOSIS — I25119 Atherosclerotic heart disease of native coronary artery with unspecified angina pectoris: Secondary | ICD-10-CM | POA: Diagnosis not present

## 2018-11-18 DIAGNOSIS — I10 Essential (primary) hypertension: Secondary | ICD-10-CM

## 2018-11-18 DIAGNOSIS — I5042 Chronic combined systolic (congestive) and diastolic (congestive) heart failure: Secondary | ICD-10-CM | POA: Diagnosis not present

## 2018-11-18 LAB — BASIC METABOLIC PANEL
BUN/Creatinine Ratio: 30 — ABNORMAL HIGH (ref 10–24)
BUN: 29 mg/dL — ABNORMAL HIGH (ref 8–27)
CO2: 24 mmol/L (ref 20–29)
Calcium: 8.9 mg/dL (ref 8.6–10.2)
Chloride: 104 mmol/L (ref 96–106)
Creatinine, Ser: 0.96 mg/dL (ref 0.76–1.27)
GFR calc Af Amer: 94 mL/min/{1.73_m2} (ref >59)
GFR calc non Af Amer: 81 mL/min/{1.73_m2} (ref >59)
Glucose: 318 mg/dL — ABNORMAL HIGH (ref 65–99)
Potassium: 5.4 mmol/L — ABNORMAL HIGH (ref 3.5–5.2)
Sodium: 136 mmol/L (ref 134–144)

## 2018-11-18 NOTE — Telephone Encounter (Signed)
Reviewed results with patient. He states he did not take insulin last night due to hypoglycemia. He ate before coming in this morning for lab work. We discussed sources of dietary K+ and I advised him to avoid high potassium-containing foods. He admits to eating a lot of potatoes in addition to other high K+ foods. He will work on better diet and return to [place] on or around June 19 for repeat bmet. He thanked me for the call.

## 2018-12-02 DIAGNOSIS — E119 Type 2 diabetes mellitus without complications: Secondary | ICD-10-CM | POA: Diagnosis not present

## 2018-12-02 DIAGNOSIS — I2510 Atherosclerotic heart disease of native coronary artery without angina pectoris: Secondary | ICD-10-CM | POA: Diagnosis not present

## 2018-12-14 ENCOUNTER — Telehealth: Payer: Self-pay | Admitting: Cardiovascular Disease

## 2018-12-14 DIAGNOSIS — I10 Essential (primary) hypertension: Secondary | ICD-10-CM

## 2018-12-14 DIAGNOSIS — I5042 Chronic combined systolic (congestive) and diastolic (congestive) heart failure: Secondary | ICD-10-CM

## 2018-12-14 NOTE — Telephone Encounter (Signed)
Called patient and made him aware order was released. Patient verbalized understanding and thanked me for the call.

## 2018-12-14 NOTE — Telephone Encounter (Signed)
  Patient would like to have lab work done at [facility] in [place]. He would like [place] to release orders to them.

## 2018-12-17 ENCOUNTER — Other Ambulatory Visit: Payer: Medicare Other

## 2018-12-17 DIAGNOSIS — I10 Essential (primary) hypertension: Secondary | ICD-10-CM | POA: Diagnosis not present

## 2018-12-17 DIAGNOSIS — I5042 Chronic combined systolic (congestive) and diastolic (congestive) heart failure: Secondary | ICD-10-CM | POA: Diagnosis not present

## 2018-12-17 LAB — BASIC METABOLIC PANEL
BUN/Creatinine Ratio: 26 — ABNORMAL HIGH (ref 10–24)
BUN: 23 mg/dL (ref 8–27)
CO2: 23 mmol/L (ref 20–29)
Calcium: 8.7 mg/dL (ref 8.6–10.2)
Chloride: 108 mmol/L — ABNORMAL HIGH (ref 96–106)
Creatinine, Ser: 0.88 mg/dL (ref 0.76–1.27)
GFR calc Af Amer: 102 mL/min/{1.73_m2} (ref >59)
GFR calc non Af Amer: 88 mL/min/{1.73_m2} (ref >59)
Glucose: 244 mg/dL — ABNORMAL HIGH (ref 65–99)
Potassium: 4.4 mmol/L (ref 3.5–5.2)
Sodium: 140 mmol/L (ref 134–144)

## 2019-01-11 ENCOUNTER — Ambulatory Visit (INDEPENDENT_AMBULATORY_CARE_PROVIDER_SITE_OTHER): Payer: Medicare Other | Admitting: *Deleted

## 2019-01-11 DIAGNOSIS — I442 Atrioventricular block, complete: Secondary | ICD-10-CM | POA: Diagnosis not present

## 2019-01-11 DIAGNOSIS — I5042 Chronic combined systolic (congestive) and diastolic (congestive) heart failure: Secondary | ICD-10-CM

## 2019-01-11 LAB — CUP PACEART REMOTE DEVICE CHECK
Battery Remaining Longevity: 98 mo
Battery Voltage: 3.01 V
Brady Statistic AP VP Percent: 5.6 %
Brady Statistic AP VS Percent: 0.01 %
Brady Statistic AS VP Percent: 92.09 %
Brady Statistic AS VS Percent: 2.31 %
Brady Statistic RA Percent Paced: 5.47 %
Brady Statistic RV Percent Paced: 93.8 %
Date Time Interrogation Session: 20200714083723
HighPow Impedance: 66 Ohm
Implantable Lead Implant Date: 20111213
Implantable Lead Implant Date: 20111213
Implantable Lead Implant Date: 20191230
Implantable Lead Implant Date: 20191230
Implantable Lead Location: 753858
Implantable Lead Location: 753859
Implantable Lead Location: 753860
Implantable Lead Location: 753860
Implantable Lead Model: 5076
Implantable Lead Model: 5076
Implantable Pulse Generator Implant Date: 20191230
Lead Channel Impedance Value: 1007 Ohm
Lead Channel Impedance Value: 1007 Ohm
Lead Channel Impedance Value: 218.5 Ohm
Lead Channel Impedance Value: 223.149
Lead Channel Impedance Value: 260.667
Lead Channel Impedance Value: 260.667
Lead Channel Impedance Value: 267.31 Ohm
Lead Channel Impedance Value: 342 Ohm
Lead Channel Impedance Value: 399 Ohm
Lead Channel Impedance Value: 437 Ohm
Lead Channel Impedance Value: 437 Ohm
Lead Channel Impedance Value: 456 Ohm
Lead Channel Impedance Value: 456 Ohm
Lead Channel Impedance Value: 646 Ohm
Lead Channel Impedance Value: 779 Ohm
Lead Channel Impedance Value: 779 Ohm
Lead Channel Impedance Value: 817 Ohm
Lead Channel Impedance Value: 988 Ohm
Lead Channel Pacing Threshold Amplitude: 0.5 V
Lead Channel Pacing Threshold Amplitude: 0.875 V
Lead Channel Pacing Threshold Amplitude: 1.5 V
Lead Channel Pacing Threshold Pulse Width: 0.4 ms
Lead Channel Pacing Threshold Pulse Width: 0.4 ms
Lead Channel Pacing Threshold Pulse Width: 0.4 ms
Lead Channel Sensing Intrinsic Amplitude: 2.5 mV
Lead Channel Sensing Intrinsic Amplitude: 2.5 mV
Lead Channel Sensing Intrinsic Amplitude: 31.625 mV
Lead Channel Sensing Intrinsic Amplitude: 31.625 mV
Lead Channel Setting Pacing Amplitude: 2 V
Lead Channel Setting Pacing Amplitude: 2 V
Lead Channel Setting Pacing Amplitude: 2 V
Lead Channel Setting Pacing Pulse Width: 0.4 ms
Lead Channel Setting Pacing Pulse Width: 0.4 ms
Lead Channel Setting Sensing Sensitivity: 0.3 mV

## 2019-01-14 DIAGNOSIS — Z6827 Body mass index (BMI) 27.0-27.9, adult: Secondary | ICD-10-CM | POA: Diagnosis not present

## 2019-01-14 DIAGNOSIS — Z1159 Encounter for screening for other viral diseases: Secondary | ICD-10-CM | POA: Diagnosis not present

## 2019-01-14 DIAGNOSIS — G20 Parkinson's disease: Secondary | ICD-10-CM | POA: Diagnosis not present

## 2019-01-14 DIAGNOSIS — I5042 Chronic combined systolic (congestive) and diastolic (congestive) heart failure: Secondary | ICD-10-CM | POA: Diagnosis not present

## 2019-01-14 DIAGNOSIS — E782 Mixed hyperlipidemia: Secondary | ICD-10-CM | POA: Diagnosis not present

## 2019-01-14 DIAGNOSIS — I2510 Atherosclerotic heart disease of native coronary artery without angina pectoris: Secondary | ICD-10-CM | POA: Diagnosis not present

## 2019-01-14 DIAGNOSIS — E1169 Type 2 diabetes mellitus with other specified complication: Secondary | ICD-10-CM | POA: Diagnosis not present

## 2019-01-25 DIAGNOSIS — Z1159 Encounter for screening for other viral diseases: Secondary | ICD-10-CM | POA: Diagnosis not present

## 2019-01-25 DIAGNOSIS — Z0000 Encounter for general adult medical examination without abnormal findings: Secondary | ICD-10-CM | POA: Diagnosis not present

## 2019-01-27 NOTE — Progress Notes (Signed)
Remote ICD transmission.   

## 2019-02-18 DIAGNOSIS — E119 Type 2 diabetes mellitus without complications: Secondary | ICD-10-CM | POA: Diagnosis not present

## 2019-02-18 DIAGNOSIS — H9193 Unspecified hearing loss, bilateral: Secondary | ICD-10-CM | POA: Diagnosis not present

## 2019-02-18 DIAGNOSIS — H6123 Impacted cerumen, bilateral: Secondary | ICD-10-CM | POA: Diagnosis not present

## 2019-02-18 DIAGNOSIS — H9313 Tinnitus, bilateral: Secondary | ICD-10-CM | POA: Diagnosis not present

## 2019-02-18 DIAGNOSIS — H61303 Acquired stenosis of external ear canal, unspecified, bilateral: Secondary | ICD-10-CM | POA: Diagnosis not present

## 2019-02-23 DIAGNOSIS — Z961 Presence of intraocular lens: Secondary | ICD-10-CM | POA: Diagnosis not present

## 2019-02-23 DIAGNOSIS — E113293 Type 2 diabetes mellitus with mild nonproliferative diabetic retinopathy without macular edema, bilateral: Secondary | ICD-10-CM | POA: Diagnosis not present

## 2019-02-23 DIAGNOSIS — Z794 Long term (current) use of insulin: Secondary | ICD-10-CM | POA: Diagnosis not present

## 2019-03-14 DIAGNOSIS — H9319 Tinnitus, unspecified ear: Secondary | ICD-10-CM | POA: Diagnosis not present

## 2019-03-14 DIAGNOSIS — H61303 Acquired stenosis of external ear canal, unspecified, bilateral: Secondary | ICD-10-CM | POA: Diagnosis not present

## 2019-03-14 DIAGNOSIS — H903 Sensorineural hearing loss, bilateral: Secondary | ICD-10-CM | POA: Diagnosis not present

## 2019-04-13 ENCOUNTER — Ambulatory Visit (INDEPENDENT_AMBULATORY_CARE_PROVIDER_SITE_OTHER): Payer: Medicare Other | Admitting: *Deleted

## 2019-04-13 DIAGNOSIS — I442 Atrioventricular block, complete: Secondary | ICD-10-CM | POA: Diagnosis not present

## 2019-04-13 DIAGNOSIS — I5042 Chronic combined systolic (congestive) and diastolic (congestive) heart failure: Secondary | ICD-10-CM

## 2019-04-14 ENCOUNTER — Telehealth: Payer: Self-pay | Admitting: Cardiovascular Disease

## 2019-04-14 LAB — CUP PACEART REMOTE DEVICE CHECK
Battery Remaining Longevity: 95 mo
Battery Voltage: 3 V
Brady Statistic AP VP Percent: 3.04 %
Brady Statistic AP VS Percent: 0.01 %
Brady Statistic AS VP Percent: 95.16 %
Brady Statistic AS VS Percent: 1.79 %
Brady Statistic RA Percent Paced: 3.01 %
Brady Statistic RV Percent Paced: 96.23 %
Date Time Interrogation Session: 20201014062603
HighPow Impedance: 67 Ohm
Implantable Lead Implant Date: 20111213
Implantable Lead Implant Date: 20111213
Implantable Lead Implant Date: 20191230
Implantable Lead Implant Date: 20191230
Implantable Lead Location: 753858
Implantable Lead Location: 753859
Implantable Lead Location: 753860
Implantable Lead Location: 753860
Implantable Lead Model: 5076
Implantable Lead Model: 5076
Implantable Pulse Generator Implant Date: 20191230
Lead Channel Impedance Value: 1216 Ohm
Lead Channel Impedance Value: 1216 Ohm
Lead Channel Impedance Value: 1254 Ohm
Lead Channel Impedance Value: 243.712
Lead Channel Impedance Value: 243.712
Lead Channel Impedance Value: 284.712
Lead Channel Impedance Value: 329.069
Lead Channel Impedance Value: 329.069
Lead Channel Impedance Value: 342 Ohm
Lead Channel Impedance Value: 399 Ohm
Lead Channel Impedance Value: 437 Ohm
Lead Channel Impedance Value: 494 Ohm
Lead Channel Impedance Value: 551 Ohm
Lead Channel Impedance Value: 551 Ohm
Lead Channel Impedance Value: 817 Ohm
Lead Channel Impedance Value: 874 Ohm
Lead Channel Impedance Value: 874 Ohm
Lead Channel Impedance Value: 893 Ohm
Lead Channel Pacing Threshold Amplitude: 0.5 V
Lead Channel Pacing Threshold Amplitude: 0.875 V
Lead Channel Pacing Threshold Amplitude: 2 V
Lead Channel Pacing Threshold Pulse Width: 0.4 ms
Lead Channel Pacing Threshold Pulse Width: 0.4 ms
Lead Channel Pacing Threshold Pulse Width: 0.4 ms
Lead Channel Sensing Intrinsic Amplitude: 3 mV
Lead Channel Sensing Intrinsic Amplitude: 3 mV
Lead Channel Sensing Intrinsic Amplitude: 30.625 mV
Lead Channel Sensing Intrinsic Amplitude: 30.625 mV
Lead Channel Setting Pacing Amplitude: 2 V
Lead Channel Setting Pacing Amplitude: 2 V
Lead Channel Setting Pacing Amplitude: 2.5 V
Lead Channel Setting Pacing Pulse Width: 0.4 ms
Lead Channel Setting Pacing Pulse Width: 0.4 ms
Lead Channel Setting Sensing Sensitivity: 0.3 mV

## 2019-04-14 NOTE — Telephone Encounter (Signed)
New Message  Patient is calling to see if he needs to have blood work done prior to his appointment on 05/30/2019 at 3:40 pm. No orders are in the chart for blood work. Please give patient a call back to confirm.

## 2019-04-21 NOTE — Telephone Encounter (Signed)
Patient will need fasting lipid, liver panel, and bmet prior to appointment on 11/30. Left message for patient to call back to let me know if he would like to get lab work done at [facility] in [place] prior to his appointment. I also advised that we can use lab work done by PCP if done recently.

## 2019-04-27 NOTE — Progress Notes (Signed)
Remote ICD transmission.   

## 2019-05-16 DIAGNOSIS — I2510 Atherosclerotic heart disease of native coronary artery without angina pectoris: Secondary | ICD-10-CM | POA: Diagnosis not present

## 2019-05-16 DIAGNOSIS — Z6826 Body mass index (BMI) 26.0-26.9, adult: Secondary | ICD-10-CM | POA: Diagnosis not present

## 2019-05-16 DIAGNOSIS — I5042 Chronic combined systolic (congestive) and diastolic (congestive) heart failure: Secondary | ICD-10-CM | POA: Diagnosis not present

## 2019-05-16 DIAGNOSIS — Z1159 Encounter for screening for other viral diseases: Secondary | ICD-10-CM | POA: Diagnosis not present

## 2019-05-16 DIAGNOSIS — E1169 Type 2 diabetes mellitus with other specified complication: Secondary | ICD-10-CM | POA: Diagnosis not present

## 2019-05-16 DIAGNOSIS — E782 Mixed hyperlipidemia: Secondary | ICD-10-CM | POA: Diagnosis not present

## 2019-05-16 DIAGNOSIS — J4520 Mild intermittent asthma, uncomplicated: Secondary | ICD-10-CM | POA: Diagnosis not present

## 2019-05-16 DIAGNOSIS — G20 Parkinson's disease: Secondary | ICD-10-CM | POA: Diagnosis not present

## 2019-05-16 NOTE — Telephone Encounter (Signed)
Spoke with patient who states he had lab work done today at Dr. Blanch Media office today and he will ask them to fax results to [place]. I gave him our fax number and he thanked me for the call.

## 2019-05-30 ENCOUNTER — Ambulatory Visit (INDEPENDENT_AMBULATORY_CARE_PROVIDER_SITE_OTHER): Payer: Medicare Other | Admitting: Cardiovascular Disease

## 2019-05-30 ENCOUNTER — Other Ambulatory Visit: Payer: Self-pay

## 2019-05-30 ENCOUNTER — Encounter: Payer: Self-pay | Admitting: Cardiovascular Disease

## 2019-05-30 VITALS — BP 124/64 | HR 88 | Ht 69.0 in | Wt 182.8 lb

## 2019-05-30 DIAGNOSIS — Z951 Presence of aortocoronary bypass graft: Secondary | ICD-10-CM

## 2019-05-30 DIAGNOSIS — Z950 Presence of cardiac pacemaker: Secondary | ICD-10-CM

## 2019-05-30 DIAGNOSIS — I255 Ischemic cardiomyopathy: Secondary | ICD-10-CM

## 2019-05-30 DIAGNOSIS — I2510 Atherosclerotic heart disease of native coronary artery without angina pectoris: Secondary | ICD-10-CM | POA: Diagnosis not present

## 2019-05-30 DIAGNOSIS — I5042 Chronic combined systolic (congestive) and diastolic (congestive) heart failure: Secondary | ICD-10-CM | POA: Diagnosis not present

## 2019-05-30 MED ORDER — SPIRONOLACTONE 25 MG PO TABS: 12.5000 mg | ORAL_TABLET | Freq: Every day | ORAL | 3 refills | Status: DC

## 2019-05-30 MED ORDER — SACUBITRIL-VALSARTAN 49-51 MG PO TABS: 1.0000 | ORAL_TABLET | Freq: Two times a day (BID) | ORAL | 3 refills | Status: DC

## 2019-05-30 MED ORDER — CARVEDILOL 12.5 MG PO TABS: 18.7500 mg | ORAL_TABLET | Freq: Two times a day (BID) | ORAL | 3 refills | Status: DC

## 2019-05-30 NOTE — Patient Instructions (Addendum)
Medication Instructions:  Your physician has recommended you make the following change in your medication: INCREASE Carvedilol (Coreg) 18.75 mg (1 1/2 tablets) twice daily  STOP Furosemide (Lasix)  *If you need a refill on your cardiac medications before your next appointment, please call your pharmacy*  Lab Work: None Ordered   Testing/Procedures: Your physician has requested that you have an echocardiogram. Echocardiography is a painless test that uses sound waves to create images of your heart. It provides your doctor with information about the size and shape of your heart and how well your heart's chambers and valves are working. This procedure takes approximately one hour. There are no restrictions for this procedure.    Follow-Up: At [facility], you and your health needs are our priority.  As part of our continuing mission to provide you with exceptional heart care, we have created designated Provider Care Teams.  These Care Teams include your primary Cardiologist (physician) and Advanced Practice Providers (APPs -  Physician Assistants and Nurse Practitioners) who all work together to provide you with the care you need, when you need it.  Your next appointment:   6 week(s) on Wed. January 27  The format for your next appointment:   In Person  Provider:   You may see Mertie Moores, MD or one of the following Advanced Practice Providers on your designated Care Team:    Richardson Dopp, PA-C  [place], [place]  Daune Perch, [place]

## 2019-05-30 NOTE — Progress Notes (Signed)
Cardiology Office Note   Date:  05/30/2019   ID:  Jeffrey Parsons, DOB January 25, 1951, MRN [id number]  PCP:  Abigail Miyamoto, MD  Cardiologist:   Kristeen Miss, MD   No chief complaint on file.  Problem lis 1. CAD - CABG atg 45 at [facility] ( 2002)  2. Diabetes 3. Pacer  4. Hyperlipidemia  5. Parkinsons disease 6. Asthma     Jeffrey Parsons is a 68 y.o. male who presents for further evaluation of his CAD CABG in 2001 at [place]  - age 74   Pacer in 2011 Recently moved to [place].  estabilshing care here   No angina .  Operated a [employer] for 40 years.  Now retired.  Exercises regularly   Nov. 82018:  Doing well.   No Cp or dyspea,  No cough or cold symptoms Goes to  the gym 5 days a week  On his own, he  Decreased the atorvastatin to 20   December 14, 2017: Doing well.  Has established with Dr. Graciela Husbands for EP . [employer] 5 days a week .  Does the bike, treadmill , weight machines  Hx of hyperlipidemia  BP is a bit low today  - is on Sinamet  Does not check it at home .   Eats regularly   July 30, 2018: Jeffrey Parsons is seen today for follow-up of his coronary artery disease.  He has a history of sick sinus syndrome and has a pacemaker / ICD.  He works out on a daily basis. Has chronic combined systolic and diastolic congestive heart failure.  His ejection fraction is 15 to 20%.   He has grade 3   diastolic filling. He has been started on Entresto 24-26 Lasix was increased to 40 mg a day  I plan on increasing the entresto to 49-51 BID and decrease the lasix to 20 mg a day     May 30, 2019:  Jeffrey Parsons is seen today for follow-up of his chronic systolic congestive heart failure, coronary artery disease and sick sinus syndrome. We increased his Entresto to 49-51 at his last visit.  He seems to be tolerating it very well and feels good. Also has parkinsons which seems to be gradually progression .   Is on Sinemet   Past Medical History:  Diagnosis Date   . AICD (automatic cardioverter/defibrillator) present   . Asthma   . Chronic systolic CHF (congestive heart failure) (HCC) 05/26/2017   Echo 11/19: EF 15-20, diff HK, Gr 3 DD, mod MR, severe LAE, mod RV dilation, mod reduced RVSF, mild TR, mild PI  . Complete heart block (HCC) 05/26/2017  . Coronary artery disease involving native coronary artery of native heart without angina pectoris 09/12/2016  . Hypertension   . Hypoglycemia   . Ischemic cardiomyopathy 05/26/2017  . Pacemaker 05/26/2017  . Pneumonia 2019  . Retinopathy   . Type 2 diabetes mellitus (HCC)     Past Surgical History:  Procedure Laterality Date  . BI-VENTRICULAR IMPLANTABLE CARDIOVERTER DEFIBRILLATOR UPGRADE  06/25/2018  . BIV UPGRADE N/A 06/25/2018   Procedure: BIV ICD UPGRADE;  Surgeon: Duke Salvia, MD;  Location: [facility] INVASIVE CV LAB;  Service: Cardiovascular;  Laterality: N/A;  . CARDIAC CATHETERIZATION  2001; ~ 2013  . CATARACT EXTRACTION W/ INTRAOCULAR LENS  IMPLANT, BILATERAL Bilateral   . CORONARY ARTERY BYPASS GRAFT  2001   "CABG X5"  . INSERT / REPLACE / REMOVE PACEMAKER  05/2010  . KNEE SURGERY Left  AS A CHILD  . RIGHT/LEFT HEART CATH AND CORONARY/GRAFT ANGIOGRAPHY N/A 06/16/2017   Procedure: RIGHT/LEFT HEART CATH AND CORONARY/GRAFT ANGIOGRAPHY;  Surgeon: Sherren Mocha, MD;  Location: [place] CV LAB;  Service: Cardiovascular;  Laterality: N/A;  . TONSILLECTOMY       Current Outpatient Medications  Medication Sig Dispense Refill  . acetaminophen (TYLENOL) 500 MG tablet Take 500 mg by mouth daily as needed for moderate pain or headache.    . albuterol (PROAIR HFA) 108 (90 Base) MCG/ACT inhaler Inhale 2 puffs into the lungs every 6 (six) hours as needed for wheezing or shortness of breath.    [name] aspirin EC 81 MG tablet Take 1 tablet (81 mg total) by mouth daily.    . carbidopa-levodopa (SINEMET CR) 50-200 MG tablet Take 1 tablet by mouth every evening.     . carbidopa-levodopa (SINEMET IR)  25-100 MG tablet Take 2 tablets by mouth 3 (three) times daily.     . carvedilol (COREG) 12.5 MG tablet Take 1 tablet (12.5 mg total) by mouth 2 (two) times daily. 180 tablet 3  . furosemide (LASIX) 20 MG tablet Take one tablet (20mg ) by mouth as needed only. 90 tablet 3  . insulin glargine (LANTUS) 100 UNIT/ML injection Inject 20-40 Units into the skin at bedtime.     . Insulin Isophane & Regular Human (NOVOLIN 70/30 FLEXPEN) (70-30) 100 UNIT/ML PEN Inject 9-14 Units into the skin 3 (three) times daily.    . Insulin Pen Needle (PEN NEEDLES 5/16") 30G X 8 MM MISC by Does not apply route.    . Multiple Vitamin (MULTIVITAMIN) tablet Take 1 tablet by mouth daily.    . sacubitril-valsartan (ENTRESTO) 49-51 MG Take 1 tablet by mouth 2 (two) times daily. 60 tablet 11  . spironolactone (ALDACTONE) 25 MG tablet Take 0.5 tablets (12.5 mg total) by mouth daily. 45 tablet 3  . topiramate (TOPAMAX) 50 MG tablet Take 50 mg by mouth 2 (two) times daily.    . vitamin B-12 (CYANOCOBALAMIN) 1000 MCG tablet Take 1,000 mcg by mouth daily.    . vitamin C (ASCORBIC ACID) 500 MG tablet Take 1,000 mg by mouth daily.      No current facility-administered medications for this visit.     PAD Screen 09/12/2016  Previous PAD dx? No  Previous surgical procedure? No  Pain with walking? No  Feet/toe relief with dangling? No  Painful, non-healing ulcers? No  Extremities discolored? No      Allergies:   Onion and Sulfa antibiotics    Social History:  The patient  reports that he has never smoked. He has never used smokeless tobacco. He reports that he does not drink alcohol or use drugs.   Family History:  The patient's family history includes Bronchitis in his mother; Coronary artery disease in his father; Heart disease in his father; Hypertension in his brother and sister.    ROS:  Please see the history of present illness.   Physical Exam: Blood pressure 124/64, pulse 88, height 5\' 9"  (1.753 m), weight 182 lb  12.8 oz (82.9 kg), SpO2 96 %.  GEN:  Well nourished, well developed in no acute distress HEENT: Normal NECK: No JVD; No carotid bruits LYMPHATICS: No lymphadenopathy CARDIAC: RRR , no murmurs, rubs, gallops RESPIRATORY:  Clear to auscultation without rales, wheezing or rhonchi  ABDOMEN: Soft, non-tender, non-distended MUSCULOSKELETAL:  No edema; No deformity  SKIN: Warm and dry NEUROLOGIC:  Alert and oriented x 3    EKG:  May 30, 2019: Atrial sensed and biventricular paced.  Heart rate is 90.  His QRS duration is 154 ms.  PR interval is 132 ms.   Recent Labs: 06/14/2018: Hemoglobin 13.4; Platelets 218 12/17/2018: BUN 23; Creatinine, Ser 0.88; Potassium 4.4; Sodium 140    Lipid Panel    Component Value Date/Time   CHOL 115 12/16/2016 0924   TRIG 74 12/16/2016 0924   HDL 48 12/16/2016 0924   CHOLHDL 2.4 12/16/2016 0924   LDLCALC 52 12/16/2016 0924      Wt Readings from Last 3 Encounters:  05/30/19 182 lb 12.8 oz (82.9 kg)  11/17/18 182 lb (82.6 kg)  10/14/18 179 lb (81.2 kg)      Other studies Reviewed: Additional studies/ records that were reviewed today include: . Review of the above records demonstrates:    ASSESSMENT AND PLAN:  1.  Chronic systolic congestive heart failure.  Jeffrey Parsons presents for further evaluation and management of his chronic congestive heart failure.  He is now on middle dose Entresto.  He has an upgraded ICD and now has a biventricular pacer.  Despite this.  His QRS duration is still 154 ms.  I will asked Dr. Graciela HusbandsKlein to review his records and see if there is a way to pace him in such a way that the QRS duration is shorter.  We will repeat his echocardiogram. DC lasix - he is not taking  Cont spironolactone   2.  CAD - no angina   3. Hyperlipidemia:   Last lipid levels show that his LDL is mildly elevated.  He admits to eating bacon and sausage on a regular basis.  I encouraged him to back off the bacon and sausage.  We will recheck  his lipids again in the near future.  3. Essential hypertension:       4.  Sick sinus syndrome  -     Kristeen MissPhilip , MD  05/30/2019 4:05 PM    [facility] [address], [place], [place]  [address] Phone: [phone]; Fax: [phone]

## 2019-06-01 MED ORDER — CARVEDILOL 12.5 MG PO TABS: 12.5000 mg | ORAL_TABLET | Freq: Two times a day (BID) | ORAL | 3 refills | Status: DC

## 2019-06-14 ENCOUNTER — Other Ambulatory Visit: Payer: Self-pay

## 2019-06-14 ENCOUNTER — Ambulatory Visit (HOSPITAL_COMMUNITY): Payer: Medicare Other | Attending: Cardiovascular Disease

## 2019-06-14 DIAGNOSIS — I5042 Chronic combined systolic (congestive) and diastolic (congestive) heart failure: Secondary | ICD-10-CM | POA: Diagnosis not present

## 2019-06-14 DIAGNOSIS — I255 Ischemic cardiomyopathy: Secondary | ICD-10-CM | POA: Diagnosis not present

## 2019-06-14 DIAGNOSIS — I2510 Atherosclerotic heart disease of native coronary artery without angina pectoris: Secondary | ICD-10-CM | POA: Diagnosis not present

## 2019-06-14 DIAGNOSIS — Z951 Presence of aortocoronary bypass graft: Secondary | ICD-10-CM

## 2019-06-14 DIAGNOSIS — Z950 Presence of cardiac pacemaker: Secondary | ICD-10-CM | POA: Diagnosis not present

## 2019-07-04 DIAGNOSIS — G248 Other dystonia: Secondary | ICD-10-CM | POA: Insufficient documentation

## 2019-07-11 ENCOUNTER — Telehealth: Payer: Self-pay

## 2019-07-11 MED ORDER — SACUBITRIL-VALSARTAN 49-51 MG PO TABS: 1.0000 | ORAL_TABLET | Freq: Two times a day (BID) | ORAL | 3 refills | Status: DC

## 2019-07-11 NOTE — Telephone Encounter (Addendum)
**Note De-Identified Jeffrey Parsons Obfuscation** I started a Entresto PA through covermymeds. Key: [id number]   The pt reports that he has new INS through:  [facility] ID: [id number] BIN: [id number] PCN: [id number] GRP: [id number]

## 2019-07-11 NOTE — Telephone Encounter (Signed)
**Note De-Identified Siddhanth Denk Obfuscation** Following message received from covermymeds:  Linna Darner Key: [id number] - PA Case ID: [id number]  Outcome  Approved today  Case [id number]; Status: Approved;Review Type: Prior Auth; Coverage Start Date:07/01/2019;Coverage End Date:07/10/2020;  Drug Entresto 49-51MG  tablets  Form [employer] Electronic PA Form  I have notified the pts pharmacy ([facility]) of this approval and sent Entresto refill to [facility] per pt request.

## 2019-07-12 NOTE — Telephone Encounter (Signed)
**Note De-Identified Maddeline Roorda Obfuscation** Letter received from [employer] stating that they did approve the pts [place] PA. Approval good from 07/01/2019 until 01/12022. Patient ID: [id number]

## 2019-07-13 ENCOUNTER — Ambulatory Visit (INDEPENDENT_AMBULATORY_CARE_PROVIDER_SITE_OTHER): Payer: Medicare Other | Admitting: *Deleted

## 2019-07-13 DIAGNOSIS — I442 Atrioventricular block, complete: Secondary | ICD-10-CM

## 2019-07-13 LAB — CUP PACEART REMOTE DEVICE CHECK
Battery Remaining Longevity: 93 mo
Battery Voltage: 3 V
Brady Statistic AP VP Percent: 3.26 %
Brady Statistic AP VS Percent: 0.03 %
Brady Statistic AS VP Percent: 94.42 %
Brady Statistic AS VS Percent: 2.29 %
Brady Statistic RA Percent Paced: 3.24 %
Brady Statistic RV Percent Paced: 94.89 %
Date Time Interrogation Session: 20210113012402
HighPow Impedance: 65 Ohm
Implantable Lead Implant Date: 20111213
Implantable Lead Implant Date: 20111213
Implantable Lead Implant Date: 20191230
Implantable Lead Implant Date: 20191230
Implantable Lead Location: 753858
Implantable Lead Location: 753859
Implantable Lead Location: 753860
Implantable Lead Location: 753860
Implantable Lead Model: 5076
Implantable Lead Model: 5076
Implantable Pulse Generator Implant Date: 20191230
Lead Channel Impedance Value: 1102 Ohm
Lead Channel Impedance Value: 1121 Ohm
Lead Channel Impedance Value: 1159 Ohm
Lead Channel Impedance Value: 237.12 Ohm
Lead Channel Impedance Value: 241.412
Lead Channel Impedance Value: 279.484
Lead Channel Impedance Value: 293.313
Lead Channel Impedance Value: 299.908
Lead Channel Impedance Value: 342 Ohm
Lead Channel Impedance Value: 437 Ohm
Lead Channel Impedance Value: 456 Ohm
Lead Channel Impedance Value: 456 Ohm
Lead Channel Impedance Value: 494 Ohm
Lead Channel Impedance Value: 513 Ohm
Lead Channel Impedance Value: 722 Ohm
Lead Channel Impedance Value: 836 Ohm
Lead Channel Impedance Value: 836 Ohm
Lead Channel Impedance Value: 874 Ohm
Lead Channel Pacing Threshold Amplitude: 0.5 V
Lead Channel Pacing Threshold Amplitude: 0.75 V
Lead Channel Pacing Threshold Amplitude: 1.5 V
Lead Channel Pacing Threshold Pulse Width: 0.4 ms
Lead Channel Pacing Threshold Pulse Width: 0.4 ms
Lead Channel Pacing Threshold Pulse Width: 0.4 ms
Lead Channel Sensing Intrinsic Amplitude: 2.875 mV
Lead Channel Sensing Intrinsic Amplitude: 2.875 mV
Lead Channel Sensing Intrinsic Amplitude: 4.5 mV
Lead Channel Sensing Intrinsic Amplitude: 4.5 mV
Lead Channel Setting Pacing Amplitude: 1.5 V
Lead Channel Setting Pacing Amplitude: 2 V
Lead Channel Setting Pacing Amplitude: 2 V
Lead Channel Setting Pacing Pulse Width: 0.4 ms
Lead Channel Setting Pacing Pulse Width: 0.4 ms
Lead Channel Setting Sensing Sensitivity: 0.3 mV

## 2019-07-27 ENCOUNTER — Ambulatory Visit (INDEPENDENT_AMBULATORY_CARE_PROVIDER_SITE_OTHER): Payer: Medicare Other | Admitting: Internal Medicine

## 2019-07-27 ENCOUNTER — Encounter: Payer: Self-pay | Admitting: Internal Medicine

## 2019-07-27 ENCOUNTER — Other Ambulatory Visit: Payer: Self-pay

## 2019-07-27 ENCOUNTER — Ambulatory Visit (INDEPENDENT_AMBULATORY_CARE_PROVIDER_SITE_OTHER): Payer: Medicare Other | Admitting: Cardiovascular Disease

## 2019-07-27 ENCOUNTER — Encounter: Payer: Self-pay | Admitting: Cardiovascular Disease

## 2019-07-27 VITALS — BP 122/60 | HR 96 | Ht 69.0 in | Wt 177.0 lb

## 2019-07-27 VITALS — BP 126/60 | HR 100 | Ht 69.0 in | Wt 177.0 lb

## 2019-07-27 DIAGNOSIS — Z959 Presence of cardiac and vascular implant and graft, unspecified: Secondary | ICD-10-CM | POA: Diagnosis not present

## 2019-07-27 DIAGNOSIS — I5042 Chronic combined systolic (congestive) and diastolic (congestive) heart failure: Secondary | ICD-10-CM | POA: Diagnosis not present

## 2019-07-27 DIAGNOSIS — I255 Ischemic cardiomyopathy: Secondary | ICD-10-CM

## 2019-07-27 DIAGNOSIS — I442 Atrioventricular block, complete: Secondary | ICD-10-CM | POA: Diagnosis not present

## 2019-07-27 DIAGNOSIS — I10 Essential (primary) hypertension: Secondary | ICD-10-CM | POA: Diagnosis not present

## 2019-07-27 MED ORDER — CARVEDILOL 25 MG PO TABS: 25.0000 mg | ORAL_TABLET | Freq: Two times a day (BID) | ORAL | 3 refills | Status: DC

## 2019-07-27 NOTE — Progress Notes (Signed)
Cardiology Office Note   Date:  07/27/2019   ID:  Jeffrey Parsons, DOB 18-Sep-1950, MRN [id number]  PCP:  Abigail Miyamoto, MD  Cardiologist:   Kristeen Miss, MD   No chief complaint on file.  Problem lis 1. CAD - CABG atg 16 at [facility] ( 2002)  2. Diabetes 3. Pacer  4. Hyperlipidemia  5. Parkinsons disease 6. Asthma     Jeffrey Parsons is a 69 y.o. male who presents for further evaluation of his CAD CABG in 2001 at [place]  - age 47   Pacer in 2011 Recently moved to [place].  estabilshing care here   No angina .  Operated a [employer] for 40 years.  Now retired.  Exercises regularly   Nov. 82018:  Doing well.   No Cp or dyspea,  No cough or cold symptoms Goes to  the gym 5 days a week  On his own, he  Decreased the atorvastatin to 20   December 14, 2017: Doing well.  Has established with Dr. Graciela Husbands for EP . [employer] 5 days a week .  Does the bike, treadmill , weight machines  Hx of hyperlipidemia  BP is a bit low today  - is on Sinamet  Does not check it at home .   Eats regularly   July 30, 2018: Jeffrey Parsons is seen today for follow-up of his coronary artery disease.  He has a history of sick sinus syndrome and has a pacemaker / ICD.  He works out on a daily basis. Has chronic combined systolic and diastolic congestive heart failure.  His ejection fraction is 15 to 20%.   He has grade 3   diastolic filling. He has been started on Entresto 24-26 Lasix was increased to 40 mg a day  I plan on increasing the entresto to 49-51 BID and decrease the lasix to 20 mg a day     May 30, 2019:  Jeffrey Parsons is seen today for follow-up of his chronic systolic congestive heart failure, coronary artery disease and sick sinus syndrome. We increased his Entresto to 49-51 at his last visit.  He seems to be tolerating it very well and feels good. Also has parkinsons which seems to be gradually progression .   Is on Sinemet   Jan. 27, 2021: Jeffrey Parsons is doing ok -he  has a history of coronary artery disease-status post coronary artery bypass grafting around 2001.  He has developed congestive heart failure.  He now has a biventricular ICD and has been on standard CHF medications.  Forgot his nightly dose of entresto for a week but has caught back up  Echocardiogram in December, 2020 reveals a persistently and severely reduced left ventricular systolic function.  The ejection fraction is 20 to 25%.  Past Medical History:  Diagnosis Date  . AICD (automatic cardioverter/defibrillator) present   . Asthma   . Chronic systolic CHF (congestive heart failure) (HCC) 05/26/2017   Echo 11/19: EF 15-20, diff HK, Gr 3 DD, mod MR, severe LAE, mod RV dilation, mod reduced RVSF, mild TR, mild PI  . Complete heart block (HCC) 05/26/2017  . Coronary artery disease involving native coronary artery of native heart without angina pectoris 09/12/2016  . Hypertension   . Hypoglycemia   . Ischemic cardiomyopathy 05/26/2017  . Pacemaker 05/26/2017  . Pneumonia 2019  . Retinopathy   . Type 2 diabetes mellitus (HCC)     Past Surgical History:  Procedure Laterality Date  . BI-VENTRICULAR IMPLANTABLE CARDIOVERTER DEFIBRILLATOR  UPGRADE  06/25/2018  . BIV UPGRADE N/A 06/25/2018   Procedure: BIV ICD UPGRADE;  Surgeon: Duke Salvia, MD;  Location[facility] INVASIVE CV LAB;  Service: Cardiovascular;  Laterality: N/A;  . CARDIAC CATHETERIZATION  2001; ~ 2013  . CATARACT EXTRACTION W/ INTRAOCULAR LENS  IMPLANT, BILATERAL Bilateral   . CORONARY ARTERY BYPASS GRAFT  2001   "CABG X5"  . INSERT / REPLACE / REMOVE PACEMAKER  05/2010  . KNEE SURGERY Left    AS A CHILD  . RIGHT/LEFT HEART CATH AND CORONARY/GRAFT ANGIOGRAPHY N/A 06/16/2017   Procedure: RIGHT/LEFT HEART CATH AND CORONARY/GRAFT ANGIOGRAPHY;  Surgeon: Tonny Bollman, MD;  Location: [facility] INVASIVE CV LAB;  Service: Cardiovascular;  Laterality: N/A;  . TONSILLECTOMY       Current Outpatient Medications  Medication Sig Dispense  Refill  . acetaminophen (TYLENOL) 500 MG tablet Take 500 mg by mouth daily as needed for moderate pain or headache.    . albuterol (PROAIR HFA) 108 (90 Base) MCG/ACT inhaler Inhale 2 puffs into the lungs every 6 (six) hours as needed for wheezing or shortness of breath.    [name] aspirin EC 81 MG tablet Take 1 tablet (81 mg total) by mouth daily.    . carbidopa-levodopa (SINEMET CR) 50-200 MG tablet Take 1 tablet by mouth every evening.     . carbidopa-levodopa (SINEMET IR) 25-100 MG tablet Take 2 tablets by mouth 4 (four) times daily.     . carvedilol (COREG) 12.5 MG tablet Take 1 tablet (12.5 mg total) by mouth 2 (two) times daily. 180 tablet 3  . insulin glargine (LANTUS) 100 UNIT/ML injection Inject 20-40 Units into the skin at bedtime.     . Insulin Isophane & Regular Human (NOVOLIN 70/30 FLEXPEN) (70-30) 100 UNIT/ML PEN Inject 9-14 Units into the skin 3 (three) times daily.    . Insulin Pen Needle (PEN NEEDLES 5/16") 30G X 8 MM MISC by Does not apply route.    . Multiple Vitamin (MULTIVITAMIN) tablet Take 1 tablet by mouth daily.    . sacubitril-valsartan (ENTRESTO) 49-51 MG Take 1 tablet by mouth 2 (two) times daily. 180 tablet 3  . spironolactone (ALDACTONE) 25 MG tablet Take 0.5 tablets (12.5 mg total) by mouth daily. 45 tablet 3  . topiramate (TOPAMAX) 50 MG tablet Take 50 mg by mouth 2 (two) times daily.    . vitamin B-12 (CYANOCOBALAMIN) 1000 MCG tablet Take 1,000 mcg by mouth daily.    . vitamin C (ASCORBIC ACID) 500 MG tablet Take 1,000 mg by mouth daily.      No current facility-administered medications for this visit.    PAD Screen 09/12/2016  Previous PAD dx? No  Previous surgical procedure? No  Pain with walking? No  Feet/toe relief with dangling? No  Painful, non-healing ulcers? No  Extremities discolored? No      Allergies:   Onion and Sulfa antibiotics    Social History:  The patient  reports that he has never smoked. He has never used smokeless tobacco. He reports  that he does not drink alcohol or use drugs.   Family History:  The patient's family history includes Bronchitis in his mother; Coronary artery disease in his father; Heart disease in his father; Hypertension in his brother and sister.    ROS:  Please see the history of present illness.   Physical Exam: Blood pressure 126/60, pulse 100, height 5\' 9"  (1.753 m), weight 177 lb (80.3 kg), SpO2 98 %.  GEN:  Well nourished, well developed  in no acute distress HEENT: Normal NECK: No JVD; No carotid bruits LYMPHATICS: No lymphadenopathy CARDIAC: RRR ,  HR remains a little elevated.  RESPIRATORY:  Clear to auscultation without rales, wheezing or rhonchi  ABDOMEN: Soft, non-tender, non-distended MUSCULOSKELETAL:  No edema; No deformity  SKIN: Warm and dry NEUROLOGIC:  Alert and oriented x 3     EKG:      Recent Labs: 12/17/2018: BUN 23; Creatinine, Ser 0.88; Potassium 4.4; Sodium 140    Lipid Panel    Component Value Date/Time   CHOL 115 12/16/2016 0924   TRIG 74 12/16/2016 0924   HDL 48 12/16/2016 0924   CHOLHDL 2.4 12/16/2016 0924   LDLCALC 52 12/16/2016 0924      Wt Readings from Last 3 Encounters:  07/27/19 177 lb (80.3 kg)  05/30/19 182 lb 12.8 oz (82.9 kg)  11/17/18 182 lb (82.6 kg)      Other studies Reviewed: Additional studies/ records that were reviewed today include: . Review of the above records demonstrates:    ASSESSMENT AND PLAN:  1.  Chronic systolic congestive heart failure.  Jeffrey Parsons presents for further evaluation and management of his chronic congestive heart failure.  He is now on middle dose Entresto.  He has an upgraded ICD and now has a biventricular pacer.  His QRS width on his last EKG was still fairly wide.  I have asked Dr. Caryl Comes to look at that to see if there may be some changes in pacing that will help shorten his QRS duration and reduce his dyssynchrony on his echo.  To my recollection I do not think there were any changes that could be  made but he will be seeing Dr. Caryl Comes later today.  2.  CAD - no angina   3. Hyperlipidemia:   Will check labs at his next office visit   3. Essential hypertension:    BP is well controlled.   4.  Sick sinus syndrome  -     Mertie Moores, MD  07/27/2019 2:29 PM    [facility] [address], [place], [place]  [address] Phone: [phone]; Fax: [phone]

## 2019-07-27 NOTE — Patient Instructions (Addendum)

## 2019-07-27 NOTE — Patient Instructions (Signed)
Medication Instructions:  1) INCREASE Carvedilol to 25mg  twice daily  *If you need a refill on your cardiac medications before your next appointment, please call your pharmacy*  Lab Work: None If you have labs (blood work) drawn today and your tests are completely normal, you will receive your results only by: MyChart Message (if you have MyChart) OR . A paper copy in the mail If you have any lab test that is abnormal or we need to change your treatment, we will call you to review the results.  Testing/Procedures: None  Follow-Up: At [facility], you and your health needs are our priority.  As part of our continuing mission to provide you with exceptional heart care, we have created designated Provider Care Teams.  These Care Teams include your primary Cardiologist (physician) and Advanced Practice Providers (APPs -  Physician Assistants and Nurse Practitioners) who all work together to provide you with the care you need, when you need it.  Your next appointment:   6 month(s)  The format for your next appointment:   In Person  Provider:   You may see [facility], MD or one of the following Advanced Practice Providers on your designated Care Team:    Kristeen Miss, PA-C  [place], [place]  [place], NP   Other Instructions

## 2019-07-27 NOTE — Progress Notes (Signed)
MRSA yes       Patient Care Team: [name], MD as PCP - General (Family Medicine) Nahser, Wonda Cheng, MD as PCP - Cardiology (Cardiology) Deboraha Sprang, MD as PCP - Electrophysiology (Cardiology)   HPI  Jeffrey Parsons is a 68 y.o. male Seen in follow-up for pacemaker implanted in 2011 for syncope with complete heart block.  No recurrent syncope.  Underwent CRT Medtronic  upgrade 12/19 When he was seen a year ago, hoping been that he would be able to be put on guideline directed therapy; carvedilol was initiated.  Losartan was continued but not changed to [facility] because of low blood pressure.  Coronary artery disease and bypass surgery 2002.  The patient denies chest pain, shortness of breath, nocturnal dyspnea, orthopnea or peripheral edema.  There have been no palpitations, lightheadedness or syncope.  Activity however is largely limited by his Parkinson's.     DATE TEST EF   11/17 Myoview  19 % No ischemia/prior infarcts  11/17 Echo  25 %   12/18 LHC  LIMA-LAD, SVG-D, Rad-OM patent SVG-PD;SVG-PLA occluded  11/19 Echo 15-25% LAE severe (52/2.5/39) MR mod      Date Cr K  6/18 0.83 4.4  10/19 0.89 4.5  2/20 0.93 4.4  6/20 0.88 4.4      s.   Records and Results Reviewed  Past Medical History:  Diagnosis Date  . AICD (automatic cardioverter/defibrillator) present   . Asthma   . Chronic systolic CHF (congestive heart failure) ([place]) 05/26/2017   Echo 11/19: EF 15-20, diff HK, Gr 3 DD, mod MR, severe LAE, mod RV dilation, mod reduced RVSF, mild TR, mild PI  . Complete heart block ([place]) 05/26/2017  . Coronary artery disease involving native coronary artery of native heart without angina pectoris 09/12/2016  . Hypertension   . Hypoglycemia   . Ischemic cardiomyopathy 05/26/2017  . Pacemaker 05/26/2017  . Pneumonia 2019  . Retinopathy   . Type 2 diabetes mellitus ([place])     Past Surgical History:  Procedure Laterality Date  .  BI-VENTRICULAR IMPLANTABLE CARDIOVERTER DEFIBRILLATOR UPGRADE  06/25/2018  . BIV UPGRADE N/A 06/25/2018   Procedure: BIV ICD UPGRADE;  Surgeon: Deboraha Sprang, MD;  Location: [place] [facility];  Service: Cardiovascular;  Laterality: N/A;  . CARDIAC CATHETERIZATION  2001; ~ 2013  . CATARACT EXTRACTION W/ INTRAOCULAR LENS  IMPLANT, BILATERAL Bilateral   . CORONARY ARTERY BYPASS GRAFT  2001   "CABG X5"  . INSERT / REPLACE / REMOVE PACEMAKER  05/2010  . KNEE SURGERY Left    AS A CHILD  . RIGHT/LEFT HEART CATH AND CORONARY/GRAFT ANGIOGRAPHY N/A 06/16/2017   Procedure: RIGHT/LEFT HEART CATH AND CORONARY/GRAFT ANGIOGRAPHY;  Surgeon: Sherren Mocha, MD;  Location: [place] [facility];  Service: Cardiovascular;  Laterality: N/A;  . TONSILLECTOMY      Current Meds  Medication Sig  . acetaminophen (TYLENOL) 500 MG tablet Take 500 mg by mouth daily as needed for moderate pain or headache.  . albuterol (PROAIR HFA) 108 (90 Base) MCG/ACT inhaler Inhale 2 puffs into the lungs every 6 (six) hours as needed for wheezing or shortness of breath.  [name] aspirin EC 81 MG tablet Take 1 tablet (81 mg total) by mouth daily.  . carbidopa-levodopa (SINEMET CR) 50-200 MG tablet Take 1 tablet by mouth every evening.   . carbidopa-levodopa (SINEMET IR) 25-100 MG tablet Take 2 tablets by mouth 4 (four) times daily.   . carvedilol (COREG) 25 MG tablet Take  1 tablet (25 mg total) by mouth 2 (two) times daily.  . insulin glargine (LANTUS) 100 UNIT/ML injection Inject 20-40 Units into the skin at bedtime.   . Insulin Isophane & Regular Human (NOVOLIN 70/30 FLEXPEN) (70-30) 100 UNIT/ML PEN Inject 9-14 Units into the skin 3 (three) times daily.  . Insulin Pen Needle (PEN NEEDLES 5/16") 30G X 8 MM MISC by Does not apply route.  . Multiple Vitamin (MULTIVITAMIN) tablet Take 1 tablet by mouth daily.  . sacubitril-valsartan (ENTRESTO) 49-51 MG Take 1 tablet by mouth 2 (two) times daily.  [name] spironolactone (ALDACTONE) 25 MG tablet  Take 0.5 tablets (12.5 mg total) by mouth daily.  [name] topiramate (TOPAMAX) 50 MG tablet Take 50 mg by mouth 2 (two) times daily.  . vitamin B-12 (CYANOCOBALAMIN) 1000 MCG tablet Take 1,000 mcg by mouth daily.  . vitamin C (ASCORBIC ACID) 500 MG tablet Take 1,000 mg by mouth daily.     Allergies  Allergen Reactions  . Onion     Upset stomach  . Sulfa Antibiotics Other (See Comments)    FLUSH       Review of Systems negative except from HPI and PMH  Physical Exam BP 122/60   Pulse 96   Ht 5\' 9"  (1.753 m)   Wt 177 lb (80.3 kg)   SpO2 98%   BMI 26.14 kg/m  Well developed and well nourished in no acute distress HENT normal Neck supple with JVP-flat Clear Device pocket well healed; without hematoma or erythema.  There is no tethering  Regular rate and rhythm, no  gallop No murmur Abd-soft with active BS No Clubbing cyanosis edema Skin-warm and dry A & Oriented  Grossly normal sensory and motor function  ECG sinus with P-synchronous/ AV  pacing.   Assessment and  Plan Cardiomyopathy question mechanism  Ischemic heart disease with prior bypass surgery  Congestive heart failure-acute chronic-systolic-class IIb-IIIa  Complete heart block  Sinus tach   ICD -CRT Medtronic DOI 12/19  The patient's device was interrogated.  The information was reviewed. No changes were made in the programming.    Without symptoms of ischemia     Euvolemic continue current meds  For his sinus tachycardia ivabradine, now that he is on maximal dose carvedilol would be appropriate however, given that his limitations are not heart failure but Parkinson's, would not be inclined to try it.   Current medicines are reviewed at length with the patient today .  The patient does not  have concerns regarding medicines.

## 2019-08-02 NOTE — Addendum Note (Signed)
Addended by: Solon Augusta on: 08/02/2019 05:11 PM   Modules accepted: Orders

## 2019-08-04 DIAGNOSIS — E113293 Type 2 diabetes mellitus with mild nonproliferative diabetic retinopathy without macular edema, bilateral: Secondary | ICD-10-CM | POA: Diagnosis not present

## 2019-09-12 ENCOUNTER — Encounter: Payer: Self-pay | Admitting: Legal Medicine

## 2019-09-12 DIAGNOSIS — I442 Atrioventricular block, complete: Secondary | ICD-10-CM

## 2019-09-12 DIAGNOSIS — I2510 Atherosclerotic heart disease of native coronary artery without angina pectoris: Secondary | ICD-10-CM

## 2019-09-12 DIAGNOSIS — I5042 Chronic combined systolic (congestive) and diastolic (congestive) heart failure: Secondary | ICD-10-CM

## 2019-09-12 DIAGNOSIS — E1169 Type 2 diabetes mellitus with other specified complication: Secondary | ICD-10-CM

## 2019-09-12 DIAGNOSIS — Z950 Presence of cardiac pacemaker: Secondary | ICD-10-CM

## 2019-09-12 DIAGNOSIS — Z794 Long term (current) use of insulin: Secondary | ICD-10-CM

## 2019-09-13 ENCOUNTER — Other Ambulatory Visit: Payer: Self-pay

## 2019-09-13 ENCOUNTER — Encounter: Payer: Self-pay | Admitting: Legal Medicine

## 2019-09-13 ENCOUNTER — Ambulatory Visit (INDEPENDENT_AMBULATORY_CARE_PROVIDER_SITE_OTHER): Payer: Medicare Other | Admitting: Legal Medicine

## 2019-09-13 VITALS — BP 124/80 | HR 84 | Temp 97.0°F | Ht 68.0 in | Wt 176.8 lb

## 2019-09-13 DIAGNOSIS — E1159 Type 2 diabetes mellitus with other circulatory complications: Secondary | ICD-10-CM

## 2019-09-13 DIAGNOSIS — I2510 Atherosclerotic heart disease of native coronary artery without angina pectoris: Secondary | ICD-10-CM

## 2019-09-13 DIAGNOSIS — E782 Mixed hyperlipidemia: Secondary | ICD-10-CM

## 2019-09-13 DIAGNOSIS — I10 Essential (primary) hypertension: Secondary | ICD-10-CM

## 2019-09-13 DIAGNOSIS — E669 Obesity, unspecified: Secondary | ICD-10-CM | POA: Diagnosis not present

## 2019-09-13 DIAGNOSIS — I495 Sick sinus syndrome: Secondary | ICD-10-CM | POA: Insufficient documentation

## 2019-09-13 DIAGNOSIS — E119 Type 2 diabetes mellitus without complications: Secondary | ICD-10-CM

## 2019-09-13 DIAGNOSIS — E1169 Type 2 diabetes mellitus with other specified complication: Secondary | ICD-10-CM | POA: Diagnosis not present

## 2019-09-13 DIAGNOSIS — Z794 Long term (current) use of insulin: Secondary | ICD-10-CM | POA: Insufficient documentation

## 2019-09-13 NOTE — Assessment & Plan Note (Signed)

## 2019-09-13 NOTE — Assessment & Plan Note (Signed)
>>  ASSESSMENT AND PLAN FOR OBESITY, DIABETES, AND HYPERTENSION SYNDROME ([place]) WRITTEN ON 09/13/2019 12:38 PM BY PERRY, Zeb Comfort, MD  An individual care plan was established and reinforced today.  The patient's status was assessed using clinical findings on exam, labs and diagnostic testing. Patient success at meeting goals based on disease specific evidence-based guidelines and found to be good controlled. Medications were assessed and patient's understanding of the medical issues , including barriers were assessed. Recommend adherence to a diabetic diet, a graduated exercise program, HgbA1c level is checked quarterly, and urine microalbumin performed yearly .  Annual mono-filament sensation testing performed. Lower blood pressure and control hyperlipidemia is important. Get annual eye exams and annual flu shots and smoking cessation discussed.  Self management goals were discussed.

## 2019-09-13 NOTE — Progress Notes (Signed)
Established Patient Office Visit  Subjective:  Patient ID: Jeffrey Parsons, male    DOB: 11/04/1950  Age: 69 y.o. MRN: [id number]  CC:  Chief Complaint  Patient presents with  . Diabetes  . Hyperlipidemia  . Hypertension    HPI Jeffrey Parsons presents for Chronic visit  Patient present with type 2 diabetes.  Specifically, this is type 2, insulin dependent requiring diabetes, complicated by hypertension and hyperchoesteroemia.  Compliance with treatment has been good; patient take medicines as directed, maintains diet and exercise regimen, follows up as directed, and is keeping glucose diary.  Date of  diagnosis 52.  Depression screen has been performed.Tobacco screen nonsmoker. Current medicines for diabetes novolin 70/30 14 units tid - sliding scale, and lantus.  Patient is on valsaertn for renal protection and none for cholesterol control.  Patient performs foot exams daily and last ophthalmologic exam was 2 months ago  Patient presents with HF systolic and diastolic  that is stable. Diagnosis made 2001.  The course of the disease is stable.  Current medicines include entresto, spironolactone. Patient follows a low cholesterol diet and maintains a weight diary.  Patient is on low salt, low cholesterol diet and avoids alcohol.  Patient denies adverse effects of medicines. Patient is monitoring weight and has lost weight of weight.  Patient is having no pedal edema, no PND and no PND.  Patient is continuing to see cardiology..  Patient presents for follow up of hypertension.  Patient tolerating valsartan well with side effects.  Patient was diagnosed with hypertension 2010 so has been treated for hypertension for 10 years.Patient is working on maintaining diet and exercise regimen and follows up as directed. Complication include cad  Patient presents with hyperlipidemia.  Compliance with treatment has been good; patient takes medicines as directed, maintains low cholesterol diet, follows  up as directed, and maintains exercise regimen.  Patient is using none without problems.. Past Medical History:  Diagnosis Date  . AICD (automatic cardioverter/defibrillator) present   . Asthma   . Chronic systolic CHF (congestive heart failure) ([facility]) 05/26/2017   Echo 11/19: EF 15-20, diff HK, Gr 3 DD, mod MR, severe LAE, mod RV dilation, mod reduced RVSF, mild TR, mild PI  . Complete heart block ([facility]) 05/26/2017  . Coronary artery disease involving native coronary artery of native heart without angina pectoris 09/12/2016  . Hypertension   . Hypoglycemia   . Ischemic cardiomyopathy 05/26/2017  . Pacemaker 05/26/2017  . Pneumonia 2019  . Retinopathy   . Type 2 diabetes mellitus ([facility])     Past Surgical History:  Procedure Laterality Date  . BI-VENTRICULAR IMPLANTABLE CARDIOVERTER DEFIBRILLATOR UPGRADE  06/25/2018  . BIV UPGRADE N/A 06/25/2018   Procedure: BIV ICD UPGRADE;  Surgeon: Duke Salvia, MD;  Location: [facility];  Service: Cardiovascular;  Laterality: N/A;  . CARDIAC CATHETERIZATION  2001; ~ 2013  . CATARACT EXTRACTION W/ INTRAOCULAR LENS  IMPLANT, BILATERAL Bilateral   . CORONARY ARTERY BYPASS GRAFT  2001   "CABG X5"  . INSERT / REPLACE / REMOVE PACEMAKER  05/2010  . KNEE SURGERY Left    AS A CHILD  . RIGHT/LEFT HEART CATH AND CORONARY/GRAFT ANGIOGRAPHY N/A 06/16/2017   Procedure: RIGHT/LEFT HEART CATH AND CORONARY/GRAFT ANGIOGRAPHY;  Surgeon: Tonny Bollman, MD;  Location: [facility] INVASIVE CV LAB;  Service: Cardiovascular;  Laterality: N/A;  . TONSILLECTOMY      Family History  Problem Relation Age of Onset  . Bronchitis Mother   . Coronary artery disease  Father   . Heart disease Father   . Hypertension Sister   . Hypertension Brother     Social History   Socioeconomic History  . Marital status: Married    Spouse name: Not on file  . Number of children: Not on file  . Years of education: Not on file  . Highest education level: Not on file    Occupational History  . Not on file  Tobacco Use  . Smoking status: Never Smoker  . Smokeless tobacco: Never Used  Substance and Sexual Activity  . Alcohol use: Never  . Drug use: Never  . Sexual activity: Yes  Other Topics Concern  . Not on file  Social History Narrative  . Not on file   Social Determinants of Health   Financial Resource Strain:   . Difficulty of Paying Living Expenses:   Food Insecurity:   . Worried About Programme researcher, broadcasting/film/videounning Out of Food in the Last Year:   . Baristaan Out of Food in the Last Year:   Transportation Needs:   . Freight forwarderLack of Transportation (Medical):   [name]. Lack of Transportation (Non-Medical):   Physical Activity:   . Days of Exercise per Week:   . Minutes of Exercise per Session:   Stress:   . Feeling of Stress :   Social Connections:   . Frequency of Communication with Friends and Family:   . Frequency of Social Gatherings with Friends and Family:   . Attends Religious Services:   . Active Member of Clubs or Organizations:   . Attends BankerClub or Organization Meetings:   [name]. Marital Status:   Intimate Partner Violence:   . Fear of Current or Ex-Partner:   . Emotionally Abused:   [name]. Physically Abused:   . Sexually Abused:     Outpatient Medications Prior to Visit  Medication Sig Dispense Refill  . acetaminophen (TYLENOL) 500 MG tablet Take 500 mg by mouth daily as needed for moderate pain or headache.    . albuterol (PROAIR HFA) 108 (90 Base) MCG/ACT inhaler Inhale 2 puffs into the lungs every 6 (six) hours as needed for wheezing or shortness of breath.    [name]. aspirin EC 81 MG tablet Take 1 tablet (81 mg total) by mouth daily.    . carbidopa-levodopa (SINEMET CR) 50-200 MG tablet Take 1 tablet by mouth every evening.     . carbidopa-levodopa (SINEMET IR) 25-100 MG tablet Take 2 tablets by mouth 4 (four) times daily.     . carvedilol (COREG) 25 MG tablet Take 1 tablet (25 mg total) by mouth 2 (two) times daily. 180 tablet 3  . insulin glargine (LANTUS) 100 UNIT/ML  injection Inject 20-40 Units into the skin at bedtime.     . Insulin Isophane & Regular Human (NOVOLIN 70/30 FLEXPEN) (70-30) 100 UNIT/ML PEN Inject 9-14 Units into the skin 3 (three) times daily.    . Insulin Pen Needle (PEN NEEDLES 5/16") 30G X 8 MM MISC by Does not apply route.    . Multiple Vitamin (MULTIVITAMIN) tablet Take 1 tablet by mouth daily.    . sacubitril-valsartan (ENTRESTO) 49-51 MG Take 1 tablet by mouth 2 (two) times daily. 180 tablet 3  . spironolactone (ALDACTONE) 25 MG tablet Take 0.5 tablets (12.5 mg total) by mouth daily. 45 tablet 3  . topiramate (TOPAMAX) 50 MG tablet Take 50 mg by mouth 2 (two) times daily.    . vitamin B-12 (CYANOCOBALAMIN) 1000 MCG tablet Take 1,000 mcg by mouth daily.    .[name]  vitamin C (ASCORBIC ACID) 500 MG tablet Take 1,000 mg by mouth daily.      No facility-administered medications prior to visit.    Allergies  Allergen Reactions  . Onion     Upset stomach  . Sulfa Antibiotics Other (See Comments)    FLUSH     ROS Review of Systems  Constitutional: Negative.   HENT: Positive for hearing loss.   Eyes: Negative.   Respiratory: Negative.   Cardiovascular: Negative.   Gastrointestinal: Negative.   Endocrine: Negative.   Genitourinary: Negative.   Musculoskeletal: Negative.   Skin: Negative.   Allergic/Immunologic: Negative.   Neurological: Negative.   Hematological: Negative.   Psychiatric/Behavioral: Negative.       Objective:    Physical Exam  Constitutional: He is oriented to person, place, and time. He appears well-developed and well-nourished.  HENT:  Head: Normocephalic and atraumatic.  Eyes: Pupils are equal, round, and reactive to light. Conjunctivae and EOM are normal.  Cardiovascular: Normal rate, regular rhythm and normal heart sounds.  Pulmonary/Chest: Effort normal.  Abdominal: Soft. Bowel sounds are normal.  Musculoskeletal:        General: Normal range of motion.     Cervical back: Neck supple.      Comments: Muscle stiffness  Neurological: He is alert and oriented to person, place, and time. He has normal reflexes.  Positive pill rolling, some cogheel ridigity and mast like face. Slow gait  Skin: Skin is warm and dry.  Psychiatric: He has a normal mood and affect.  Vitals reviewed.  Diabetic Foot Exam - Simple   Simple Foot Form Diabetic Foot exam was performed with the following findings: Yes 09/13/2019  9:06 AM  Visual Inspection See comments: Yes Sensation Testing Intact to touch and monofilament testing bilaterally: Yes Pulse Check Posterior Tibialis and Dorsalis pulse intact bilaterally: Yes Comments Slow vascular filling of toes, he has bunion but no ulcers, some scaling of feet     BP 124/80   Pulse 84   Temp (!) 97 F (36.1 C)   Ht 5\' 8"  (1.727 m)   Wt 176 lb 12.8 oz (80.2 kg)   SpO2 99%   BMI 26.88 kg/m  Wt Readings from Last 3 Encounters:  09/13/19 176 lb 12.8 oz (80.2 kg)  07/27/19 177 lb (80.3 kg)  07/27/19 177 lb (80.3 kg)         Health Maintenance Due  Topic Date Due  . Hepatitis C Screening  Never done  . OPHTHALMOLOGY EXAM  Never done  . TETANUS/TDAP  Never done  . PNA vac Low Risk Adult (1 of 2 - PCV13) Never done  . HEMOGLOBIN A1C  10/03/2016  . URINE MICROALBUMIN  04/04/2017  . INFLUENZA VACCINE  Never done    There are no preventive care reminders to display for this patient.  No results found for: TSH Lab Results  Component Value Date   WBC 7.0 06/14/2018   HGB 13.4 06/14/2018   HCT 39.4 06/14/2018   MCV 87 06/14/2018   PLT 218 06/14/2018   Lab Results  Component Value Date   NA 140 12/17/2018   K 4.4 12/17/2018   CO2 23 12/17/2018   GLUCOSE 244 (H) 12/17/2018   BUN 23 12/17/2018   CREATININE 0.88 12/17/2018   BILITOT 0.8 12/16/2016   ALKPHOS 128 (H) 12/16/2016   AST 20 12/16/2016   ALT 14 12/16/2016   PROT 6.5 12/16/2016   ALBUMIN 4.1 12/16/2016   CALCIUM 8.7 12/17/2018   Lab  Results  Component Value Date   CHOL  115 12/16/2016   Lab Results  Component Value Date   HDL 48 12/16/2016   Lab Results  Component Value Date   LDLCALC 52 12/16/2016   Lab Results  Component Value Date   TRIG 74 12/16/2016   Lab Results  Component Value Date   CHOLHDL 2.4 12/16/2016   No results found for: HGBA1C    Assessment & Plan:   Problem List Items Addressed This Visit      Cardiovascular and Mediastinum   Coronary artery disease involving native coronary artery of native heart without angina pectoris    An individual plan was formulated based on patient history and exam, labs and evidence based data. Patient has not had recent angina or nitroglycerin use. continue present treatment.      Essential hypertension - Primary    An individual care plan was established and reinforced today.  The patient's status was assessed using clinical findings on exam and labs or diagnostic tests. The patient's success at meeting treatment goals on disease specific evidence-based guidelines and found to be well controlled. SELF MANAGEMENT: The patient and I together assessed ways to personally work towards obtaining the recommended goals. RECOMMENDATIONS: avoid decongestants found in common cold remedies, decrease consumption of alcohol, perform routine monitoring of BP with home BP cuff, exercise, reduction of dietary salt, take medicines as prescribed, try not to miss doses and quit smoking.  Regular exercise and maintaining a healthy weight is needed.  Stress reduction may help. A CLINICAL SUMMARY including written plan identify barriers to care unique to individual due to social or financial issues.  We attempt to mutually creat solutions for individual and family understanding.      Relevant Orders   CBC with Differential   Comprehensive metabolic panel   Obesity, diabetes, and hypertension syndrome (HCC)    An individual care plan was established and reinforced today.  The patient's status was assessed using  clinical findings on exam, labs and diagnostic testing. Patient success at meeting goals based on disease specific evidence-based guidelines and found to be good controlled. Medications were assessed and patient's understanding of the medical issues , including barriers were assessed. Recommend adherence to a diabetic diet, a graduated exercise program, HgbA1c level is checked quarterly, and urine microalbumin performed yearly .  Annual mono-filament sensation testing performed. Lower blood pressure and control hyperlipidemia is important. Get annual eye exams and annual flu shots and smoking cessation discussed.  Self management goals were discussed.        Endocrine   RESOLVED: Type II diabetes mellitus (HCC)   Relevant Orders   Hemoglobin A1c     Other   Mixed hyperlipidemia    AN INDIVIDUAL CARE PLAN was established and reinforced today.  The patient's status was assessed using clinical findings on exam, lab and other diagnostic tests. The patient's disease status was assessed based on evidence-based guidelines and found to be good controlled. MEDICATIONS were reviewed. SELF MANAGEMENT GOALS have been discussed and patient's success at attaining the goal of low cholesterol was assessed. RECOMMENDATION given include regular exercise 3 days a week and low cholesterol/low fat diet. CLINICAL SUMMARY including written plan to identify barriers unique to the patient due to social or economic  reasons was discussed.      Relevant Orders   Lipid Panel      No orders of the defined types were placed in this encounter.   Follow-up: Return in about 4 months (  around 01/13/2020) for fasting.    [name], MD

## 2019-09-13 NOTE — Assessment & Plan Note (Signed)
AN INDIVIDUAL CARE PLAN was established and reinforced today.  The patient's status was assessed using clinical findings on exam, lab and other diagnostic tests. The patient's disease status was assessed based on evidence-based guidelines and found to be good controlled. MEDICATIONS were reviewed. SELF MANAGEMENT GOALS have been discussed and patient's success at attaining the goal of low cholesterol was assessed. RECOMMENDATION given include regular exercise 3 days a week and low cholesterol/low fat diet. CLINICAL SUMMARY including written plan to identify barriers unique to the patient due to social or economic  reasons was discussed. 

## 2019-09-13 NOTE — Assessment & Plan Note (Signed)

## 2019-09-13 NOTE — Patient Instructions (Signed)
Diabetes Mellitus and Foot Care Foot care is an important part of your health, especially when you have diabetes. Diabetes may cause you to have problems because of poor blood flow (circulation) to your feet and legs, which can cause your skin to:  Become thinner and drier.  Break more easily.  Heal more slowly.  Peel and crack. You may also have nerve damage (neuropathy) in your legs and feet, causing decreased feeling in them. This means that you may not notice minor injuries to your feet that could lead to more serious problems. Noticing and addressing any potential problems early is the best way to prevent future foot problems. How to care for your feet Foot hygiene  Wash your feet daily with warm water and mild soap. Do not use hot water. Then, pat your feet and the areas between your toes until they are completely dry. Do not soak your feet as this can dry your skin.  Trim your toenails straight across. Do not dig under them or around the cuticle. File the edges of your nails with an emery board or nail file.  Apply a moisturizing lotion or petroleum jelly to the skin on your feet and to dry, brittle toenails. Use lotion that does not contain alcohol and is unscented. Do not apply lotion between your toes. Shoes and socks  Wear clean socks or stockings every day. Make sure they are not too tight. Do not wear knee-high stockings since they may decrease blood flow to your legs.  Wear shoes that fit properly and have enough cushioning. Always look in your shoes before you put them on to be sure there are no objects inside.  To break in new shoes, wear them for just a few hours a day. This prevents injuries on your feet. Wounds, scrapes, corns, and calluses  Check your feet daily for blisters, cuts, bruises, sores, and redness. If you cannot see the bottom of your feet, use a mirror or ask someone for help.  Do not cut corns or calluses or try to remove them with medicine.  If you  find a minor scrape, cut, or break in the skin on your feet, keep it and the skin around it clean and dry. You may clean these areas with mild soap and water. Do not clean the area with peroxide, alcohol, or iodine.  If you have a wound, scrape, corn, or callus on your foot, look at it several times a day to make sure it is healing and not infected. Check for: ? Redness, swelling, or pain. ? Fluid or blood. ? Warmth. ? Pus or a bad smell. General instructions  Do not cross your legs. This may decrease blood flow to your feet.  Do not use heating pads or hot water bottles on your feet. They may burn your skin. If you have lost feeling in your feet or legs, you may not know this is happening until it is too late.  Protect your feet from hot and cold by wearing shoes, such as at the beach or on hot pavement.  Schedule a complete foot exam at least once a year (annually) or more often if you have foot problems. If you have foot problems, report any cuts, sores, or bruises to your health care provider immediately. Contact a health care provider if:  You have a medical condition that increases your risk of infection and you have any cuts, sores, or bruises on your feet.  You have an injury that is not   healing.  You have redness on your legs or feet.  You feel burning or tingling in your legs or feet.  You have pain or cramps in your legs and feet.  Your legs or feet are numb.  Your feet always feel cold.  You have pain around a toenail. Get help right away if:  You have a wound, scrape, corn, or callus on your foot and: ? You have pain, swelling, or redness that gets worse. ? You have fluid or blood coming from the wound, scrape, corn, or callus. ? Your wound, scrape, corn, or callus feels warm to the touch. ? You have pus or a bad smell coming from the wound, scrape, corn, or callus. ? You have a fever. ? You have a red line going up your leg. Summary  Check your feet every day  for cuts, sores, red spots, swelling, and blisters.  Moisturize feet and legs daily.  Wear shoes that fit properly and have enough cushioning.  If you have foot problems, report any cuts, sores, or bruises to your health care provider immediately.  Schedule a complete foot exam at least once a year (annually) or more often if you have foot problems. This information is not intended to replace advice given to you by your health care provider. Make sure you discuss any questions you have with your health care provider. Document Revised: 03/09/2019 Document Reviewed: 07/18/2016 Elsevier Patient Education  2020 [employer]  

## 2019-09-13 NOTE — Assessment & Plan Note (Signed)
An individual plan was formulated based on patient history and exam, labs and evidence based data. Patient has not had recent angina or nitroglycerin use. continue present treatment. 

## 2019-09-14 LAB — LIPID PANEL
Chol/HDL Ratio: 3.2 ratio (ref 0.0–5.0)
Cholesterol, Total: 158 mg/dL (ref 100–199)
HDL: 50 mg/dL (ref >39)
LDL Chol Calc (NIH): 95 mg/dL (ref 0–99)
Triglycerides: 65 mg/dL (ref 0–149)
VLDL Cholesterol Cal: 13 mg/dL (ref 5–40)

## 2019-09-14 LAB — COMPREHENSIVE METABOLIC PANEL
ALT: 5 IU/L (ref 0–44)
AST: 15 IU/L (ref 0–40)
Albumin/Globulin Ratio: 2.1 (ref 1.2–2.2)
Albumin: 4.1 g/dL (ref 3.8–4.8)
Alkaline Phosphatase: 132 IU/L — ABNORMAL HIGH (ref 39–117)
BUN/Creatinine Ratio: 20 (ref 10–24)
BUN: 19 mg/dL (ref 8–27)
Bilirubin Total: 0.7 mg/dL (ref 0.0–1.2)
CO2: 20 mmol/L (ref 20–29)
Calcium: 8.9 mg/dL (ref 8.6–10.2)
Chloride: 105 mmol/L (ref 96–106)
Creatinine, Ser: 0.94 mg/dL (ref 0.76–1.27)
GFR calc Af Amer: 95 mL/min/{1.73_m2} (ref >59)
GFR calc non Af Amer: 82 mL/min/{1.73_m2} (ref >59)
Globulin, Total: 2 g/dL (ref 1.5–4.5)
Glucose: 201 mg/dL — ABNORMAL HIGH (ref 65–99)
Potassium: 5 mmol/L (ref 3.5–5.2)
Sodium: 139 mmol/L (ref 134–144)
Total Protein: 6.1 g/dL (ref 6.0–8.5)

## 2019-09-14 LAB — CBC WITH DIFFERENTIAL/PLATELET
Basophils Absolute: 0.1 10*3/uL (ref 0.0–0.2)
Basos: 1 %
EOS (ABSOLUTE): 0.3 10*3/uL (ref 0.0–0.4)
Eos: 5 %
Hematocrit: 40.8 % (ref 37.5–51.0)
Hemoglobin: 13.7 g/dL (ref 13.0–17.7)
Immature Grans (Abs): 0 10*3/uL (ref 0.0–0.1)
Immature Granulocytes: 0 %
Lymphocytes Absolute: 1.1 10*3/uL (ref 0.7–3.1)
Lymphs: 16 %
MCH: 31.2 pg (ref 26.6–33.0)
MCHC: 33.6 g/dL (ref 31.5–35.7)
MCV: 93 fL (ref 79–97)
Monocytes Absolute: 0.4 10*3/uL (ref 0.1–0.9)
Monocytes: 5 %
Neutrophils Absolute: 5.2 10*3/uL (ref 1.4–7.0)
Neutrophils: 73 %
Platelets: 170 10*3/uL (ref 150–450)
RBC: 4.39 x10E6/uL (ref 4.14–5.80)
RDW: 11.2 % — ABNORMAL LOW (ref 11.6–15.4)
WBC: 7.1 10*3/uL (ref 3.4–10.8)

## 2019-09-14 LAB — CARDIOVASCULAR RISK ASSESSMENT

## 2019-09-14 LAB — HEMOGLOBIN A1C
Est. average glucose Bld gHb Est-mCnc: 189 mg/dL
Hgb A1c MFr Bld: 8.2 % — ABNORMAL HIGH (ref 4.8–5.6)

## 2019-09-14 NOTE — Progress Notes (Signed)
CBC normal, glucose 201, kidney and liver tests normal, cholesterol normal, A1c 8.2 high need to increase lantus to 30 units at night lp

## 2019-10-12 ENCOUNTER — Ambulatory Visit (INDEPENDENT_AMBULATORY_CARE_PROVIDER_SITE_OTHER): Payer: Medicare Other | Admitting: *Deleted

## 2019-10-12 DIAGNOSIS — I442 Atrioventricular block, complete: Secondary | ICD-10-CM

## 2019-10-12 LAB — CUP PACEART REMOTE DEVICE CHECK
Battery Remaining Longevity: 88 mo
Battery Voltage: 2.99 V
Brady Statistic AP VP Percent: 3.5 %
Brady Statistic AP VS Percent: 0.04 %
Brady Statistic AS VP Percent: 94.71 %
Brady Statistic AS VS Percent: 1.76 %
Brady Statistic RA Percent Paced: 3.48 %
Brady Statistic RV Percent Paced: 95.32 %
Date Time Interrogation Session: 20210414022723
HighPow Impedance: 62 Ohm
Implantable Lead Implant Date: 20111213
Implantable Lead Implant Date: 20111213
Implantable Lead Implant Date: 20191230
Implantable Lead Implant Date: 20191230
Implantable Lead Location: 753858
Implantable Lead Location: 753859
Implantable Lead Location: 753860
Implantable Lead Location: 753860
Implantable Lead Model: 5076
Implantable Lead Model: 5076
Implantable Pulse Generator Implant Date: 20191230
Lead Channel Impedance Value: 1159 Ohm
Lead Channel Impedance Value: 1178 Ohm
Lead Channel Impedance Value: 1178 Ohm
Lead Channel Impedance Value: 241.412
Lead Channel Impedance Value: 241.412
Lead Channel Impedance Value: 292.657
Lead Channel Impedance Value: 315.129
Lead Channel Impedance Value: 315.129
Lead Channel Impedance Value: 342 Ohm
Lead Channel Impedance Value: 380 Ohm
Lead Channel Impedance Value: 456 Ohm
Lead Channel Impedance Value: 494 Ohm
Lead Channel Impedance Value: 513 Ohm
Lead Channel Impedance Value: 513 Ohm
Lead Channel Impedance Value: 817 Ohm
Lead Channel Impedance Value: 817 Ohm
Lead Channel Impedance Value: 836 Ohm
Lead Channel Impedance Value: 874 Ohm
Lead Channel Pacing Threshold Amplitude: 0.375 V
Lead Channel Pacing Threshold Amplitude: 0.875 V
Lead Channel Pacing Threshold Amplitude: 1.875 V
Lead Channel Pacing Threshold Pulse Width: 0.4 ms
Lead Channel Pacing Threshold Pulse Width: 0.4 ms
Lead Channel Pacing Threshold Pulse Width: 0.4 ms
Lead Channel Sensing Intrinsic Amplitude: 2.75 mV
Lead Channel Sensing Intrinsic Amplitude: 2.75 mV
Lead Channel Sensing Intrinsic Amplitude: 2.75 mV
Lead Channel Sensing Intrinsic Amplitude: 2.75 mV
Lead Channel Setting Pacing Amplitude: 1.75 V
Lead Channel Setting Pacing Amplitude: 2 V
Lead Channel Setting Pacing Amplitude: 2.5 V
Lead Channel Setting Pacing Pulse Width: 0.4 ms
Lead Channel Setting Pacing Pulse Width: 0.4 ms
Lead Channel Setting Sensing Sensitivity: 0.3 mV

## 2019-10-12 NOTE — Progress Notes (Signed)
ICD Remote  

## 2019-10-21 ENCOUNTER — Telehealth: Payer: Self-pay

## 2019-10-21 NOTE — Telephone Encounter (Signed)
Fine see if Selena Batten has a Archivist lp

## 2019-12-18 ENCOUNTER — Other Ambulatory Visit: Payer: Self-pay | Admitting: Legal Medicine

## 2019-12-29 ENCOUNTER — Telehealth: Payer: Self-pay | Admitting: Legal Medicine

## 2019-12-29 ENCOUNTER — Ambulatory Visit: Payer: Medicare Other

## 2019-12-29 ENCOUNTER — Other Ambulatory Visit: Payer: Self-pay | Admitting: Legal Medicine

## 2019-12-29 NOTE — Progress Notes (Signed)
  Chronic Care Management   Note  12/29/2019 Name: Jeffrey Parsons MRN: [id number] DOB: 10/14/1950  Jeffrey Parsons is a 69 y.o. year old male who is a primary care patient of Abigail Miyamoto, MD. I reached out to Linna Darner by phone today in response to a referral sent by Jeffrey Parsons PCP, Abigail Miyamoto, MD.   Jeffrey Parsons was given information about Chronic Care Management services today including:  1. CCM service includes personalized support from designated clinical staff supervised by his physician, including individualized plan of care and coordination with other care providers 2. 24/7 contact phone numbers for assistance for urgent and routine care needs. 3. Service will only be billed when office clinical staff spend 20 minutes or more in a month to coordinate care. 4. Only one practitioner may furnish and bill the service in a calendar month. 5. The patient may stop CCM services at any time (effective at the end of the month) by phone call to the office staff.   Patient agreed to services and verbal consent obtained.  This note is not being shared with the patient for the following reason: To respect privacy (The patient or proxy has requested that the information not be shared).  Follow up plan:   Jeffrey Parsons Upstream Scheduler

## 2020-01-09 ENCOUNTER — Other Ambulatory Visit: Payer: Self-pay | Admitting: Legal Medicine

## 2020-01-09 DIAGNOSIS — E1169 Type 2 diabetes mellitus with other specified complication: Secondary | ICD-10-CM

## 2020-01-09 DIAGNOSIS — I152 Hypertension secondary to endocrine disorders: Secondary | ICD-10-CM

## 2020-01-09 MED ORDER — METFORMIN HCL 500 MG PO TABS: 500.0000 mg | ORAL_TABLET | Freq: Two times a day (BID) | ORAL | 2 refills | Status: DC

## 2020-01-09 MED ORDER — LANTUS SOLOSTAR 100 UNIT/ML ~~LOC~~ SOPN: 30.0000 [IU] | PEN_INJECTOR | Freq: Every day | SUBCUTANEOUS | 11 refills | Status: DC

## 2020-01-11 ENCOUNTER — Ambulatory Visit (INDEPENDENT_AMBULATORY_CARE_PROVIDER_SITE_OTHER): Payer: Medicare Other | Admitting: *Deleted

## 2020-01-11 DIAGNOSIS — I495 Sick sinus syndrome: Secondary | ICD-10-CM

## 2020-01-11 LAB — CUP PACEART REMOTE DEVICE CHECK
Battery Remaining Longevity: 79 mo
Battery Voltage: 2.99 V
Brady Statistic AP VP Percent: 5.09 %
Brady Statistic AP VS Percent: 0.01 %
Brady Statistic AS VP Percent: 93.63 %
Brady Statistic AS VS Percent: 1.27 %
Brady Statistic RA Percent Paced: 4.98 %
Brady Statistic RV Percent Paced: 95.67 %
Date Time Interrogation Session: 20210714012304
HighPow Impedance: 58 Ohm
Implantable Lead Implant Date: 20111213
Implantable Lead Implant Date: 20111213
Implantable Lead Implant Date: 20191230
Implantable Lead Implant Date: 20191230
Implantable Lead Location: 753858
Implantable Lead Location: 753859
Implantable Lead Location: 753860
Implantable Lead Location: 753860
Implantable Lead Model: 5076
Implantable Lead Model: 5076
Implantable Pulse Generator Implant Date: 20191230
Lead Channel Impedance Value: 1159 Ohm
Lead Channel Impedance Value: 1216 Ohm
Lead Channel Impedance Value: 1254 Ohm
Lead Channel Impedance Value: 235.98 Ohm
Lead Channel Impedance Value: 247.358
Lead Channel Impedance Value: 284.712
Lead Channel Impedance Value: 315.129
Lead Channel Impedance Value: 335.753
Lead Channel Impedance Value: 342 Ohm
Lead Channel Impedance Value: 399 Ohm
Lead Channel Impedance Value: 437 Ohm
Lead Channel Impedance Value: 456 Ohm
Lead Channel Impedance Value: 513 Ohm
Lead Channel Impedance Value: 570 Ohm
Lead Channel Impedance Value: 817 Ohm
Lead Channel Impedance Value: 874 Ohm
Lead Channel Impedance Value: 893 Ohm
Lead Channel Impedance Value: 893 Ohm
Lead Channel Pacing Threshold Amplitude: 0.5 V
Lead Channel Pacing Threshold Amplitude: 0.875 V
Lead Channel Pacing Threshold Amplitude: 1.5 V
Lead Channel Pacing Threshold Pulse Width: 0.4 ms
Lead Channel Pacing Threshold Pulse Width: 0.4 ms
Lead Channel Pacing Threshold Pulse Width: 0.4 ms
Lead Channel Sensing Intrinsic Amplitude: 2.625 mV
Lead Channel Sensing Intrinsic Amplitude: 2.625 mV
Lead Channel Sensing Intrinsic Amplitude: 25.5 mV
Lead Channel Sensing Intrinsic Amplitude: 25.5 mV
Lead Channel Setting Pacing Amplitude: 1.75 V
Lead Channel Setting Pacing Amplitude: 2 V
Lead Channel Setting Pacing Amplitude: 2.75 V
Lead Channel Setting Pacing Pulse Width: 0.4 ms
Lead Channel Setting Pacing Pulse Width: 0.4 ms
Lead Channel Setting Sensing Sensitivity: 0.3 mV

## 2020-01-12 NOTE — Progress Notes (Signed)
Remote ICD transmission.   

## 2020-01-13 NOTE — Chronic Care Management (AMB) (Signed)
Chronic Care Management Pharmacy  Name: Jeffrey Parsons  MRN: [id number] DOB: 10-17-1950   Chief Complaint/ HPI  Jeffrey Parsons,  69 y.o. , male presents for their Initial CCM visit with the clinical pharmacist In office.  PCP : [name], MD  Their chronic conditions include: CAD, HTN, mild intermittent asthma, essential tremor, parkinson's disease, HLD, DM.   Office Visits: 01/17/2020 - CBC normal, blood sugar 201, kidney and liver tests ok, LDL cholesterol high 107 consider statin, A1c 7.5 09/13/2019 - CBC normal, glucose 201, kidney and liver tests normal, cholesterol normal, A1c 8.2 high need to increase lantus to 30 units at night.  Consult Visit: 01/11/2020 - remote pacemaker check.  10/12/2019 - remote pacemaker check.  08/04/2019 - Optometry - Diabetes eye exam.  07/27/2019 - [facility] - continue current meds. Sinus tachycardia ivabradine is an option but not inclined to prescribe due to Parkinson's- max dose of carvedilol already.  07/27/2019 - Cardiology [facility]  - BP well controlled. Lipid labs checked at next visit. Non angina Medications: Outpatient Encounter Medications as of 01/18/2020  Medication Sig   albuterol (PROAIR HFA) 108 (90 Base) MCG/ACT inhaler Inhale 2 puffs into the lungs every 6 (six) hours as needed for wheezing or shortness of breath.   aspirin EC 81 MG tablet Take 1 tablet (81 mg total) by mouth daily.   carbidopa-levodopa (SINEMET CR) 50-200 MG tablet Take 1 tablet by mouth every evening.    carbidopa-levodopa (SINEMET IR) 25-100 MG tablet Take 2 tablets by mouth 4 (four) times daily.    carvedilol (COREG) 25 MG tablet Take 1 tablet (25 mg total) by mouth 2 (two) times daily.   insulin glargine (LANTUS SOLOSTAR) 100 UNIT/ML Solostar Pen Inject 30 Units into the skin daily. Stop insulin 70/30, keep check in sugars   Insulin Pen Needle (PEN NEEDLES 5/16") 30G X 8 MM MISC by Does not apply route.   metFORMIN  (GLUCOPHAGE) 500 MG tablet Take 1 tablet (500 mg total) by mouth 2 (two) times daily with a meal. Start this with new insulin   Multiple Vitamin (MULTIVITAMIN) tablet Take 1 tablet by mouth daily.   sacubitril-valsartan (ENTRESTO) 49-51 MG Take 1 tablet by mouth 2 (two) times daily.   topiramate (TOPAMAX) 50 MG tablet Take 50 mg by mouth 2 (two) times daily.   vitamin B-12 (CYANOCOBALAMIN) 1000 MCG tablet Take 1,000 mcg by mouth daily.   vitamin C (ASCORBIC ACID) 500 MG tablet Take 1,000 mg by mouth daily.    acetaminophen (TYLENOL) 500 MG tablet Take 500 mg by mouth daily as needed for moderate pain or headache. (Patient not taking: Reported on 01/27/2020)   spironolactone (ALDACTONE) 25 MG tablet Take 0.5 tablets (12.5 mg total) by mouth daily. (Patient not taking: Reported on 01/27/2020)   [DISCONTINUED] carvedilol (COREG) 25 MG tablet Take 1 tablet (25 mg total) by mouth 2 (two) times daily.   No facility-administered encounter medications on file as of 01/18/2020.   Allergies  Allergen Reactions   Onion     Upset stomach   Sulfa Antibiotics Other (See Comments)    FLUSH    SDOH Screenings   Alcohol Screen:    Last Alcohol Screening Score (AUDIT):   Depression (PHQ2-9): Low Risk    PHQ-2 Score: 2  Financial Resource Strain:    Difficulty of Paying Living Expenses:   Food Insecurity: No Food Insecurity   Worried About Running Out of Food in the Last Year: Never true  Ran Out of Food in the Last Year: Never true  Housing: Low Risk    Last Housing Risk Score: 0  Physical Activity: Insufficiently Active   Days of Exercise per Week: 3 days   Minutes of Exercise per Session: 30 min  Social Connections:    Frequency of Communication with Friends and Family:    Frequency of Social Gatherings with Friends and Family:    Attends Religious Services:    Active Member of Clubs or Organizations:    Attends Music therapist:    Marital Status:     Stress:    Feeling of Stress :   Tobacco Use: Low Risk    Smoking Tobacco Use: Never Smoker   Smokeless Tobacco Use: Never Used  Transportation Needs: No Data processing manager (Medical): No   Lack of Transportation (Non-Medical): No     Current Diagnosis/Assessment:  Goals Addressed            This Visit's Progress    Pharmacy Care Plan       CARE PLAN ENTRY (see longitudinal plan of care for additional care plan information)  Current Barriers:   Chronic Disease Management support, education, and care coordination needs related to Hyperlipidemia, Diabetes, and Heart Failure   Heart Failure BP Readings from Last 3 Encounters:  01/17/20 (!) 116/58  09/13/19 124/80  07/27/19 122/60    Pharmacist Clinical Goal(s): o Over the next 90 days, patient will work with PharmD and providers to maintain BP goal <130/80  Current regimen:  o Carvedilol 25 mg twice daily o Entresto 49-51 mg twice daily   Interventions: o Patient's Delene Loll is unaffordable currently. Working to obtain medication via patient assistance.   Patient self care activities - Over the next 90 days, patient will: o Check BP weekly, document, and provide at future appointments o Ensure daily salt intake < 2300 mg/day  Hyperlipidemia Lab Results  Component Value Date/Time   LDLCALC 107 (H) 01/17/2020 08:26 AM    Pharmacist Clinical Goal(s): o Over the next 90 days, patient will work with PharmD and providers to achieve LDL goal < 70  Current regimen:  o Diet and exercise currently  Interventions: o Dr. [name] has recommended patient start a statin. Patient is considering statin therapy.  o Keep up the good work with exercise.  o Incorporate lean meats, vegetables, fruits and whole grains in diet.   Patient self care activities - Over the next 90 days, patient will: o Contact provider or pharmacist with any questions or concerns.  o Consider trying a statin  medication three times weekly.   Diabetes Lab Results  Component Value Date/Time   HGBA1C 7.5 (H) 01/17/2020 08:26 AM   HGBA1C 8.2 (H) 09/13/2019 09:13 AM    Pharmacist Clinical Goal(s): o Over the next 90 days, patient will work with PharmD and providers to achieve A1c goal <7%  Current regimen:  o Metformin 500 mg twice daily with meals o Lantus 30 units daily  Interventions: o Continue to work to get approval for a CGM. o Working to get Lantus approved via patient assistance.   Patient self care activities - Over the next 90 days, patient will: o Check blood sugar 3-4 times daily, document, and provide at future appointments o Contact provider with any episodes of hypoglycemia  Medication management  Pharmacist Clinical Goal(s): o Over the next 90 days, patient will work with PharmD and providers to maintain optimal medication adherence  [facility]  Interventions o Comprehensive medication review performed. o Continue current medication management strategy  Patient self care activities - Over the next 90 days, patient will: o Focus on medication adherence by using a pill box o Take medications as prescribed o Report any questions or concerns to PharmD and/or provider(s)  Initial goal documentation       Asthma   Eosinophil count:  No results found for: EOSPCT%                               Eos (Absolute):  Lab Results  Component Value Date/Time   EOSABS 0.3 01/17/2020 08:26 AM    Tobacco Status:  Social History   Tobacco Use  Smoking Status Never Smoker  Smokeless Tobacco Never Used    Patient has failed these meds in past: none reported Patient is currently controlled on the following medications:   albuterol inhaler 2 puffs q6h prn wheezing or shortness of breath Using maintenance inhaler regularly? No Frequency of rescue inhaler use:  infrequently  We discussed:  proper inhaler technique  Plan  Continue current  medications ,  Hyperlipidemia   LDL goal < 70  Lipid Panel     Component Value Date/Time   CHOL 166 01/17/2020 0826   TRIG 89 01/17/2020 0826   HDL 42 01/17/2020 0826   LDLCALC 107 (H) 01/17/2020 0826    Hepatic Function Latest Ref Rng & Units 01/17/2020 09/13/2019 12/16/2016  Total Protein 6.0 - 8.5 g/dL 6.8 6.1 6.5  Albumin 3.8 - 4.8 g/dL 4.1 4.1 4.1  AST 0 - 40 IU/L 15 15 20   ALT 0 - 44 IU/L 4 5 14   Alk Phosphatase 48 - 121 IU/L 114 132(H) 128(H)  Total Bilirubin 0.0 - 1.2 mg/dL 0.7 0.7 0.8     The 10-year ASCVD risk score Mikey Bussing DC Jr., et al., 2013) is: 29.2%   Values used to calculate the score:     Age: 51 years     Sex: Male     Is Non-Hispanic African American: No     Diabetic: Yes     Tobacco smoker: No     Systolic Blood Pressure: 921 mmHg     Is BP treated: Yes     HDL Cholesterol: 42 mg/dL     Total Cholesterol: 166 mg/dL   Patient has failed these meds in past: Atorvastatin  Patient is currently uncontrolled on the following medications:   Aspirin ec 81 mg daily  We discussed:  diet and exercise extensively. Patient has been recommended to begin a statin for cholesterol. Patient's ASCVD risk is elevated and his LDL is above goal. Patient has tried/failed atorvastatin in the past. Pharmacist will encourage statin on follow-up visit.   Plan  Consider resuming statin therapy.    Diabetes   Recent Relevant Labs: Lab Results  Component Value Date/Time   HGBA1C 7.5 (H) 01/17/2020 08:26 AM   HGBA1C 8.2 (H) 09/13/2019 09:13 AM   MICROALBUR 80 01/17/2020 08:38 AM     Checking BG: 3x per Day  Recent FBG Readings:64-280 Recent pre-meal BG readings: 95-420 Recent 2hr PP BG readings:  125-363 Patient has failed these meds in past: Novolog, novolin 70/30,  Patient is currently uncontrolled on the following medications:   Lantus solostar 30 units daily  Metformin 500 mg bid with a meal  insuln pen needles   Last diabetic Foot exam: No results found  for: HMDIABEYEEXA  Last diabetic  Eye exam: No results found for: HMDIABFOOTEX   We discussed: diet and exercise extensively. Patient has had some recent fluctuations in blood sugar. Patient can explain some recent high blood sugars with things like tortilla chips, chicken pot pie, biscuit for breakfast and McDonald[employer]. Patient exercises at the gym 3-5 times each week. He carries a juicy juice box with him now in the event of low blood sugar. Low blood sugars are improved from previous regimen. Patient's wife is working to help with the diet piece based on education from [facility]. Provided patient with a sample Dexcom CGM in office due to dexterity.   Plan  Continue current medications and   Heart Failure   Type: Combined Systolic and Diastolic  Last ejection fraction: 20-25% NYHA Class: III (marked limitation of activity)  BP today is:  <130/80  Office blood pressures are  BP Readings from Last 3 Encounters:  01/17/20 (!) 116/58  09/13/19 124/80  07/27/19 122/60    Patient has failed these meds in the past: furosemide, hydrochlorothiazide, losartan, losartan-hydrochlorothiazide, metoprolol Patient is currently controlled on the following medications:   Carvedilol 25 mg bid  Entresto 49-51 mg bid  Patient checks BP at home weekly  Patient home BP readings are ranging: at or below goal  We discussed diet and exercise extensively. Patient encouraged to consume diet of lean protein, vegetables, fruits in moderation and whole grains. Patient is in the coverage gap and Delene Loll is quite expensive. Pharmacist working with patient assistance to obtain medication for patient.   Plan  Continue current medications   Parkinson's Disease   Patient has failed these meds in past: n/a Patient is currently uncontrolled on the following medications:   Carbidopa-levodopa CR 50-200 mg every evening  Carbidopa-levodopa IR 25-100 2 tablets QID  Topiramate 50 mg bid  We discussed:   Patient is hoping there is something the neurologist can do at next visit to improve symptoms. He is having difficulty checking his blood sugar and completing some tasks due to tremor. Wife states that they can tell an improvement since he has been able to resume exercise in the gym post Covid. Days that symptoms are worse they try to get out and ride or walk.   Plan  Continue current medications and follow-up with neurologist   Health Maintenance   Patient is currently controlled on the following medications:   Acetaminophen 500 mg daily prn moderate pain or headache  Vitamin b-12 1000 mcg daily - supplementation  Multiplevitamin daily - supplementation  Vitamin C 1000 mg daily - supplementation  We discussed:  Patient rarely take tylenol. Overall pleased with medication regimen.   Plan  Continue current medications  Vaccines   Reviewed and discussed patient's vaccination history.  Will discuss further vaccines at future visits.   Immunization History  Administered Date(s) Administered   Moderna SARS-COVID-2 Vaccination 08/03/2019, 08/31/2019    Plan  Recommended patient receive annual flu vaccine in office.   Medication Management   [facility] Uses pill box? Yes Pt endorses excellent compliance  We discussed: Working to avoid doughnut hole in the future and making medications more affordable.   Plan  Continue current medication management strategy    Follow up: 1 month phone visit

## 2020-01-15 ENCOUNTER — Other Ambulatory Visit: Payer: Self-pay

## 2020-01-17 ENCOUNTER — Encounter: Payer: Self-pay | Admitting: Legal Medicine

## 2020-01-17 ENCOUNTER — Ambulatory Visit (INDEPENDENT_AMBULATORY_CARE_PROVIDER_SITE_OTHER): Payer: Medicare Other | Admitting: Legal Medicine

## 2020-01-17 ENCOUNTER — Other Ambulatory Visit: Payer: Self-pay

## 2020-01-17 VITALS — BP 116/58 | HR 82 | Temp 97.0°F | Resp 16 | Ht 69.0 in | Wt 166.4 lb

## 2020-01-17 DIAGNOSIS — E782 Mixed hyperlipidemia: Secondary | ICD-10-CM | POA: Diagnosis not present

## 2020-01-17 DIAGNOSIS — Z6823 Body mass index (BMI) 23.0-23.9, adult: Secondary | ICD-10-CM | POA: Insufficient documentation

## 2020-01-17 DIAGNOSIS — E669 Obesity, unspecified: Secondary | ICD-10-CM | POA: Diagnosis not present

## 2020-01-17 DIAGNOSIS — Z6824 Body mass index (BMI) 24.0-24.9, adult: Secondary | ICD-10-CM

## 2020-01-17 DIAGNOSIS — E1169 Type 2 diabetes mellitus with other specified complication: Secondary | ICD-10-CM | POA: Diagnosis not present

## 2020-01-17 DIAGNOSIS — E1159 Type 2 diabetes mellitus with other circulatory complications: Secondary | ICD-10-CM

## 2020-01-17 DIAGNOSIS — I10 Essential (primary) hypertension: Secondary | ICD-10-CM

## 2020-01-17 DIAGNOSIS — G20 Parkinson's disease: Secondary | ICD-10-CM

## 2020-01-17 DIAGNOSIS — Z6822 Body mass index (BMI) 22.0-22.9, adult: Secondary | ICD-10-CM | POA: Insufficient documentation

## 2020-01-17 DIAGNOSIS — I2510 Atherosclerotic heart disease of native coronary artery without angina pectoris: Secondary | ICD-10-CM

## 2020-01-17 LAB — POCT UA - MICROALBUMIN: Microalbumin Ur, POC: 80 mg/L

## 2020-01-17 MED ORDER — CARVEDILOL 25 MG PO TABS: 25.0000 mg | ORAL_TABLET | Freq: Two times a day (BID) | ORAL | 3 refills | Status: DC

## 2020-01-17 NOTE — Progress Notes (Signed)
Subjective:  Patient ID: Jeffrey Parsons, male    DOB: Oct 21, 1950  Age: 69 y.o. MRN: [id number]  Chief Complaint  Patient presents with  . Diabetes  . Hyperlipidemia  . Hypertension    HPI: chronic visit  Patient present with type 2 diabetes.  Specifically, this is type 2, insulin requiring diabetes, complicated by hypertension and hypercholesterolemia.  Compliance with treatment has been good; patient take medicines as directed, maintains diet and exercise regimen, follows up as directed, and is keeping glucose diary.  Date of  diagnosis 2010.  Depression screen has been performed.Tobacco screen nonsmoker. Current medicines for diabetes Lantus 30 units and metformin.  Patient is on valsartan for renal protection and no cholesterol medicine for cholesterol control.  Patient performs foot exams daily and last ophthalmologic exam was last 6 months  Patient presents for follow up of hypertension.  Patient tolerating valsartan well with side effects.  Patient was diagnosed with hypertension 2010 so has been treated for hypertension for 10 years.Patient is working on maintaining diet and exercise regimen and follows up as directed. Complication include CAD, CHF.  Patient presents with hyperlipidemia.  Compliance with treatment has been good; patient takes medicines as directed, maintains low cholesterol diet, follows up as directed, and maintains exercise regimen.  Patient is using none due to intolerance without problems..  No CHF symptoms, pacer was interogated last week, OK   Current Outpatient Medications on File Prior to Visit  Medication Sig Dispense Refill  . acetaminophen (TYLENOL) 500 MG tablet Take 500 mg by mouth daily as needed for moderate pain or headache.    . albuterol (PROAIR HFA) 108 (90 Base) MCG/ACT inhaler Inhale 2 puffs into the lungs every 6 (six) hours as needed for wheezing or shortness of breath.    [name] aspirin EC 81 MG tablet Take 1 tablet (81 mg total) by mouth daily.     . carbidopa-levodopa (SINEMET CR) 50-200 MG tablet Take 1 tablet by mouth every evening.     . carbidopa-levodopa (SINEMET IR) 25-100 MG tablet Take 2 tablets by mouth 4 (four) times daily.     . insulin glargine (LANTUS SOLOSTAR) 100 UNIT/ML Solostar Pen Inject 30 Units into the skin daily. Stop insulin 70/30, keep check in sugars 15 mL 11  . Insulin Pen Needle (PEN NEEDLES 5/16") 30G X 8 MM MISC by Does not apply route.    . metFORMIN (GLUCOPHAGE) 500 MG tablet Take 1 tablet (500 mg total) by mouth 2 (two) times daily with a meal. Start this with new insulin 180 tablet 2  . Multiple Vitamin (MULTIVITAMIN) tablet Take 1 tablet by mouth daily.    . sacubitril-valsartan (ENTRESTO) 49-51 MG Take 1 tablet by mouth 2 (two) times daily. 180 tablet 3  . spironolactone (ALDACTONE) 25 MG tablet Take 0.5 tablets (12.5 mg total) by mouth daily. 45 tablet 3  . topiramate (TOPAMAX) 50 MG tablet Take 50 mg by mouth 2 (two) times daily.    . vitamin B-12 (CYANOCOBALAMIN) 1000 MCG tablet Take 1,000 mcg by mouth daily.    . vitamin C (ASCORBIC ACID) 500 MG tablet Take 1,000 mg by mouth daily.     . carvedilol (COREG) 25 MG tablet Take 1 tablet (25 mg total) by mouth 2 (two) times daily. 180 tablet 3   No current facility-administered medications on file prior to visit.   Past Medical History:  Diagnosis Date  . AICD (automatic cardioverter/defibrillator) present   . Asthma   . Chronic systolic CHF (congestive  heart failure) (HCC) 05/26/2017   Echo 11/19: EF 15-20, diff HK, Gr 3 DD, mod MR, severe LAE, mod RV dilation, mod reduced RVSF, mild TR, mild PI  . Complete heart block (HCC) 05/26/2017  . Coronary artery disease involving native coronary artery of native heart without angina pectoris 09/12/2016  . Hypertension   . Ischemic cardiomyopathy 05/26/2017  . Pacemaker 05/26/2017  . Pneumonia 2019  . Retinopathy    Past Surgical History:  Procedure Laterality Date  . BI-VENTRICULAR IMPLANTABLE  CARDIOVERTER DEFIBRILLATOR UPGRADE  06/25/2018  . BIV UPGRADE N/A 06/25/2018   Procedure: BIV ICD UPGRADE;  Surgeon: Duke SalviaKlein, Steven C, MD;  [facility];  Service: Cardiovascular;  Laterality: N/A;  . CARDIAC CATHETERIZATION  2001; ~ 2013  . CATARACT EXTRACTION W/ INTRAOCULAR LENS  IMPLANT, BILATERAL Bilateral   . CORONARY ARTERY BYPASS GRAFT  2001   "CABG X5"  . INSERT / REPLACE / REMOVE PACEMAKER  05/2010  . KNEE SURGERY Left    AS A CHILD  . RIGHT/LEFT HEART CATH AND CORONARY/GRAFT ANGIOGRAPHY N/A 06/16/2017   Procedure: RIGHT/LEFT HEART CATH AND CORONARY/GRAFT ANGIOGRAPHY;  Surgeon: Tonny Bollmanooper, Michael, MD;  [facility];  Service: Cardiovascular;  Laterality: N/A;  . TONSILLECTOMY      Family History  Problem Relation Age of Onset  . Bronchitis Mother   . Coronary artery disease Father   . Heart disease Father   . Hypertension Sister   . Hypertension Brother    Social History   Socioeconomic History  . Marital status: Married    Spouse name: Not on file  . Number of children: Not on file  . Years of education: Not on file  . Highest education level: Not on file  Occupational History  . Not on file  Tobacco Use  . Smoking status: Never Smoker  . Smokeless tobacco: Never Used  Vaping Use  . Vaping Use: Never used  Substance and Sexual Activity  . Alcohol use: Never  . Drug use: Never  . Sexual activity: Yes  Other Topics Concern  . Not on file  Social History Narrative  . Not on file   Social Determinants of Health   Financial Resource Strain:   . Difficulty of Paying Living Expenses:   Food Insecurity:   . Worried About Programme researcher, broadcasting/film/videounning Out of Food in the Last Year:   . Baristaan Out of Food in the Last Year:   Transportation Needs:   . Freight forwarderLack of Transportation (Medical):   [name]. Lack of Transportation (Non-Medical):   Physical Activity:   . Days of Exercise per Week:   . Minutes of Exercise per Session:   Stress:   . Feeling of Stress :   Social  Connections:   . Frequency of Communication with Friends and Family:   . Frequency of Social Gatherings with Friends and Family:   . Attends Religious Services:   . Active Member of Clubs or Organizations:   . Attends BankerClub or Organization Meetings:   [name]. Marital Status:     Review of Systems  Constitutional: Negative.   HENT: Negative.   Eyes: Negative.   Respiratory: Negative.   Cardiovascular: Negative.   Gastrointestinal: Negative.   Endocrine: Negative.   Genitourinary: Negative.   Musculoskeletal: Negative.   Skin: Negative.   Neurological: Negative.   Psychiatric/Behavioral: Negative.      Objective:  BP (!) 116/58   Pulse 82   Temp (!) 97 F (36.1 C)   Resp 16  Ht 5\' 9"  (1.753 m)   Wt 166 lb 6.4 oz (75.5 kg)   SpO2 99%   BMI 24.57 kg/m   BP/Weight 01/17/2020 09/13/2019 07/27/2019  Systolic BP 116 124 122  Diastolic BP 58 80 60  Wt. (Lbs) 166.4 176.8 177  BMI 24.57 26.88 26.14    Physical Exam Vitals reviewed.  Constitutional:      Appearance: Normal appearance.  HENT:     Head: Normocephalic and atraumatic.     Right Ear: Tympanic membrane normal.     Left Ear: Tympanic membrane normal.     Nose: Nose normal.     Mouth/Throat:     Mouth: Mucous membranes are dry.     Pharynx: Oropharynx is clear.  Eyes:     Extraocular Movements: Extraocular movements intact.     Conjunctiva/sclera: Conjunctivae normal.     Pupils: Pupils are equal, round, and reactive to light.  Cardiovascular:     Rate and Rhythm: Normal rate and regular rhythm.     Pulses: Normal pulses.     Heart sounds: Normal heart sounds.  Pulmonary:     Effort: Pulmonary effort is normal.     Breath sounds: Normal breath sounds.  Abdominal:     General: Abdomen is flat. Bowel sounds are normal.     Palpations: Abdomen is soft.  Musculoskeletal:     Cervical back: Normal range of motion and neck supple.  Skin:    General: Skin is warm.     Capillary Refill: Capillary refill takes  more than 3 seconds.  Neurological:     General: No focal deficit present.     Mental Status: He is alert and oriented to person, place, and time. Mental status is at baseline.     Comments: Resting tremor  Psychiatric:        Mood and Affect: Mood normal.        Thought Content: Thought content normal.    Depression screen [facility] 2/9 01/17/2020  Decreased Interest 0  Down, Depressed, Hopeless 0  PHQ - 2 Score 0  Altered sleeping 0  Tired, decreased energy 1  Change in appetite 0  Feeling bad or failure about yourself  0  Trouble concentrating 0  Moving slowly or fidgety/restless 1  Suicidal thoughts 0  PHQ-9 Score 2  Difficult doing work/chores Not difficult at all    Diabetic Foot Exam - Simple   Simple Foot Form Diabetic Foot exam was performed with the following findings: Yes 01/17/2020  8:15 AM  Visual Inspection No deformities, no ulcerations, no other skin breakdown bilaterally: Yes Sensation Testing Intact to touch and monofilament testing bilaterally: Yes Pulse Check See comments: Yes Comments Poor capillary filling of toes      Lab Results  Component Value Date   WBC 7.1 09/13/2019   HGB 13.7 09/13/2019   HCT 40.8 09/13/2019   PLT 170 09/13/2019   GLUCOSE 201 (H) 09/13/2019   CHOL 158 09/13/2019   TRIG 65 09/13/2019   HDL 50 09/13/2019   LDLCALC 95 09/13/2019   ALT 5 09/13/2019   AST 15 09/13/2019   NA 139 09/13/2019   K 5.0 09/13/2019   CL 105 09/13/2019   CREATININE 0.94 09/13/2019   BUN 19 09/13/2019   CO2 20 09/13/2019   HGBA1C 8.2 (H) 09/13/2019   MICROALBUR 80 01/17/2020      Assessment & Plan:   1. Obesity, diabetes, and hypertension syndrome (HCC) - Hemoglobin A1c - POCT UA - Microalbumin An individual  care plan for diabetes was established and reinforced today.  The patient's status was assessed using clinical findings on exam, labs and diagnostic testing. Patient success at meeting goals based on disease specific evidence-based  guidelines and found to be good controlled. Medications were assessed and patient's understanding of the medical issues , including barriers were assessed. Recommend adherence to a diabetic diet, a graduated exercise program, HgbA1c level is checked quarterly, and urine microalbumin performed yearly .  Annual mono-filament sensation testing performed. Lower blood pressure and control hyperlipidemia is important. Get annual eye exams and annual flu shots and smoking cessation discussed.  Self management goals were discussed.  2. Parkinson's disease (HCC) Patient has longtanding parkinson and is having less control on carbidoa/levodopa she is to see neurologist and we discussed alternatived to assist in control  3. Essential hypertension - CBC with Differential/Platelet - Comprehensive metabolic panel An individual hypertension care plan was established and reinforced today.  The patient's status was assessed using clinical findings on exam and labs or diagnostic tests. The patient's success at meeting treatment goals on disease specific evidence-based guidelines and found to be well controlled. SELF MANAGEMENT: The patient and I together assessed ways to personally work towards obtaining the recommended goals. RECOMMENDATIONS: avoid decongestants found in common cold remedies, decrease consumption of alcohol, perform routine monitoring of BP with home BP cuff, exercise, reduction of dietary salt, take medicines as prescribed, try not to miss doses and quit smoking.  Regular exercise and maintaining a healthy weight is needed.  Stress reduction may help. A CLINICAL SUMMARY including written plan identify barriers to care unique to individual due to social or financial issues.  We attempt to mutually creat solutions for individual and family understanding.  4. Mixed hyperlipidemia - Lipid panel AN INDIVIDUAL CARE PLAN for hyperlipidemia/ cholesterol was established and reinforced today.  The patient's  status was assessed using clinical findings on exam, lab and other diagnostic tests. The patient's disease status was assessed based on evidence-based guidelines and found to be well controlled. MEDICATIONS were reviewed. SELF MANAGEMENT GOALS have been discussed and patient's success at attaining the goal of low cholesterol was assessed. RECOMMENDATION given include regular exercise 3 days a week and low cholesterol/low fat diet. CLINICAL SUMMARY including written plan to identify barriers unique to the patient due to social or economic  reasons was discussed.  5. Coronary artery disease involving native coronary artery of native heart without angina pectoris Patient's CAD was assessed using history and physical along with other information to maximize treatment.  Evidence based criteria was use in deciding proper management for this disease process.  Patient's CAD is under good control.therapy continue present treatment.  6. BMI 24.0-24.9, adult Continue diet and exercise      Orders Placed This Encounter  Procedures  . CBC with Differential/Platelet  . Comprehensive metabolic panel  . Lipid panel  . Hemoglobin A1c  . POCT UA - Microalbumin     Follow-up: Return in about 4 months (around 05/19/2020) for fasting and MCr PE kim.  An After Visit Summary was printed and given to the patient.  [name] [facility] [phone]

## 2020-01-18 ENCOUNTER — Ambulatory Visit: Payer: Medicare Other

## 2020-01-18 DIAGNOSIS — I5042 Chronic combined systolic (congestive) and diastolic (congestive) heart failure: Secondary | ICD-10-CM

## 2020-01-18 DIAGNOSIS — E782 Mixed hyperlipidemia: Secondary | ICD-10-CM

## 2020-01-18 DIAGNOSIS — E1169 Type 2 diabetes mellitus with other specified complication: Secondary | ICD-10-CM

## 2020-01-18 LAB — COMPREHENSIVE METABOLIC PANEL
ALT: 4 IU/L (ref 0–44)
AST: 15 IU/L (ref 0–40)
Albumin/Globulin Ratio: 1.5 (ref 1.2–2.2)
Albumin: 4.1 g/dL (ref 3.8–4.8)
Alkaline Phosphatase: 114 IU/L (ref 48–121)
BUN/Creatinine Ratio: 24 (ref 10–24)
BUN: 26 mg/dL (ref 8–27)
Bilirubin Total: 0.7 mg/dL (ref 0.0–1.2)
CO2: 23 mmol/L (ref 20–29)
Calcium: 9.1 mg/dL (ref 8.6–10.2)
Chloride: 105 mmol/L (ref 96–106)
Creatinine, Ser: 1.1 mg/dL (ref 0.76–1.27)
GFR calc Af Amer: 79 mL/min/{1.73_m2} (ref >59)
GFR calc non Af Amer: 68 mL/min/{1.73_m2} (ref >59)
Globulin, Total: 2.7 g/dL (ref 1.5–4.5)
Glucose: 96 mg/dL (ref 65–99)
Potassium: 4.5 mmol/L (ref 3.5–5.2)
Sodium: 141 mmol/L (ref 134–144)
Total Protein: 6.8 g/dL (ref 6.0–8.5)

## 2020-01-18 LAB — CBC WITH DIFFERENTIAL/PLATELET
Basophils Absolute: 0.1 10*3/uL (ref 0.0–0.2)
Basos: 1 %
EOS (ABSOLUTE): 0.3 10*3/uL (ref 0.0–0.4)
Eos: 4 %
Hematocrit: 40.7 % (ref 37.5–51.0)
Hemoglobin: 13.7 g/dL (ref 13.0–17.7)
Immature Grans (Abs): 0 10*3/uL (ref 0.0–0.1)
Immature Granulocytes: 0 %
Lymphocytes Absolute: 1.3 10*3/uL (ref 0.7–3.1)
Lymphs: 16 %
MCH: 31.4 pg (ref 26.6–33.0)
MCHC: 33.7 g/dL (ref 31.5–35.7)
MCV: 93 fL (ref 79–97)
Monocytes Absolute: 0.4 10*3/uL (ref 0.1–0.9)
Monocytes: 5 %
Neutrophils Absolute: 6.1 10*3/uL (ref 1.4–7.0)
Neutrophils: 74 %
Platelets: 165 10*3/uL (ref 150–450)
RBC: 4.36 x10E6/uL (ref 4.14–5.80)
RDW: 11.4 % — ABNORMAL LOW (ref 11.6–15.4)
WBC: 8.2 10*3/uL (ref 3.4–10.8)

## 2020-01-18 LAB — CARDIOVASCULAR RISK ASSESSMENT

## 2020-01-18 LAB — LIPID PANEL
Chol/HDL Ratio: 4 ratio (ref 0.0–5.0)
Cholesterol, Total: 166 mg/dL (ref 100–199)
HDL: 42 mg/dL (ref >39)
LDL Chol Calc (NIH): 107 mg/dL — ABNORMAL HIGH (ref 0–99)
Triglycerides: 89 mg/dL (ref 0–149)
VLDL Cholesterol Cal: 17 mg/dL (ref 5–40)

## 2020-01-18 LAB — HEMOGLOBIN A1C
Est. average glucose Bld gHb Est-mCnc: 169 mg/dL
Hgb A1c MFr Bld: 7.5 % — ABNORMAL HIGH (ref 4.8–5.6)

## 2020-01-18 NOTE — Progress Notes (Signed)
CBC normal, blood sugar 201, kidney and liver tests ok, LDL cholesterol high 107 consider statin, A1c 7.5 lp

## 2020-01-19 ENCOUNTER — Other Ambulatory Visit: Payer: Self-pay | Admitting: Legal Medicine

## 2020-01-19 DIAGNOSIS — E1159 Type 2 diabetes mellitus with other circulatory complications: Secondary | ICD-10-CM

## 2020-01-19 DIAGNOSIS — G20 Parkinson's disease: Secondary | ICD-10-CM

## 2020-01-19 DIAGNOSIS — E1169 Type 2 diabetes mellitus with other specified complication: Secondary | ICD-10-CM

## 2020-01-19 DIAGNOSIS — Z794 Long term (current) use of insulin: Secondary | ICD-10-CM

## 2020-01-19 MED ORDER — DEXCOM G6 SENSOR MISC: 1.0000 | 4 refills | Status: DC

## 2020-01-19 MED ORDER — DEXCOM G6 TRANSMITTER MISC: 1.0000 | Freq: Once | 0 refills | Status: AC

## 2020-01-19 MED ORDER — DEXCOM G6 RECEIVER DEVI: 1.0000 | Freq: Once | 0 refills | Status: AC

## 2020-01-26 ENCOUNTER — Other Ambulatory Visit: Payer: Self-pay | Admitting: Legal Medicine

## 2020-01-27 ENCOUNTER — Other Ambulatory Visit: Payer: Self-pay

## 2020-01-27 DIAGNOSIS — I10 Essential (primary) hypertension: Secondary | ICD-10-CM

## 2020-01-27 DIAGNOSIS — E1169 Type 2 diabetes mellitus with other specified complication: Secondary | ICD-10-CM

## 2020-01-27 DIAGNOSIS — E782 Mixed hyperlipidemia: Secondary | ICD-10-CM

## 2020-01-27 NOTE — Patient Instructions (Signed)
Visit Information  Thank you for your time discussing your medications. I look forward to working with you to achieve your health care goals. Below is a summary of what we talked about during our visit.   Goals Addressed            This Visit's Progress   . Pharmacy Care Plan       CARE PLAN ENTRY (see longitudinal plan of care for additional care plan information)  Current Barriers:  . Chronic Disease Management support, education, and care coordination needs related to Hyperlipidemia, Diabetes, and Heart Failure   Heart Failure BP Readings from Last 3 Encounters:  01/17/20 (!) 116/58  09/13/19 124/80  07/27/19 122/60   . Pharmacist Clinical Goal(s): o Over the next 90 days, patient will work with PharmD and providers to maintain BP goal <130/80 . Current regimen:  o Carvedilol 25 mg twice daily o Entresto 49-51 mg twice daily  . Interventions: o Patient's Sherryll Burger is unaffordable currently. Working to obtain medication via patient assistance.  . Patient self care activities - Over the next 90 days, patient will: o Check BP weekly, document, and provide at future appointments o Ensure daily salt intake < 2300 mg/day  Hyperlipidemia Lab Results  Component Value Date/Time   LDLCALC 107 (H) 01/17/2020 08:26 AM   . Pharmacist Clinical Goal(s): o Over the next 90 days, patient will work with PharmD and providers to achieve LDL goal < 70 . Current regimen:  o Diet and exercise currently . Interventions: o Dr. Marina Goodell has recommended patient start a statin. Patient is considering statin therapy.  o Keep up the good work with exercise.  o Incorporate lean meats, vegetables, fruits and whole grains in diet.  . Patient self care activities - Over the next 90 days, patient will: o Contact provider or pharmacist with any questions or concerns.  o Consider trying a statin medication three times weekly.   Diabetes Lab Results  Component Value Date/Time   HGBA1C 7.5 (H)  01/17/2020 08:26 AM   HGBA1C 8.2 (H) 09/13/2019 09:13 AM   . Pharmacist Clinical Goal(s): o Over the next 90 days, patient will work with PharmD and providers to achieve A1c goal <7% . Current regimen:  o Metformin 500 mg twice daily with meals o Lantus 30 units daily . Interventions: o Continue to work to get approval for a CGM. o Working to get Lantus approved via patient assistance.  . Patient self care activities - Over the next 90 days, patient will: o Check blood sugar 3-4 times daily, document, and provide at future appointments o Contact provider with any episodes of hypoglycemia  Medication management . Pharmacist Clinical Goal(s): o Over the next 90 days, patient will work with PharmD and providers to maintain optimal medication adherence . Current pharmacy: [facility] . Interventions o Comprehensive medication review performed. o Continue current medication management strategy . Patient self care activities - Over the next 90 days, patient will: o Focus on medication adherence by using a pill box o Take medications as prescribed o Report any questions or concerns to PharmD and/or provider(s)  Initial goal documentation       Jeffrey Parsons was given information about Chronic Care Management services today including:  1. CCM service includes personalized support from designated clinical staff supervised by his physician, including individualized plan of care and coordination with other care providers 2. 24/7 contact phone numbers for assistance for urgent and routine care needs. 3. Standard insurance, coinsurance, copays and  deductibles apply for chronic care management only during months in which we provide at least 20 minutes of these services. Most insurances cover these services at 100%, however patients may be responsible for any copay, coinsurance and/or deductible if applicable. This service may help you avoid the need for more expensive face-to-face  services. 4. Only one practitioner may furnish and bill the service in a calendar month. 5. The patient may stop CCM services at any time (effective at the end of the month) by phone call to the office staff.  Patient agreed to services and verbal consent obtained.   The patient verbalized understanding of instructions provided today and agreed to receive a mailed copy of patient instruction and/or educational materials. Telephone follow up appointment with pharmacy team member scheduled for:01/2020  Juliane Lack, PharmD Clinical Pharmacist [facility] [phone] (office) [phone] (mobile)   DASH Eating Plan DASH stands for "Dietary Approaches to Stop Hypertension." The DASH eating plan is a healthy eating plan that has been shown to reduce high blood pressure (hypertension). It may also reduce your risk for type 2 diabetes, heart disease, and stroke. The DASH eating plan may also help with weight loss. What are tips for following this plan?  General guidelines  Avoid eating more than 2,300 mg (milligrams) of salt (sodium) a day. If you have hypertension, you may need to reduce your sodium intake to 1,500 mg a day.  Limit alcohol intake to no more than 1 drink a day for nonpregnant women and 2 drinks a day for men. One drink equals 12 oz of beer, 5 oz of wine, or 1 oz of hard liquor.  Work with your health care provider to maintain a healthy body weight or to lose weight. Ask what an ideal weight is for you.  Get at least 30 minutes of exercise that causes your heart to beat faster (aerobic exercise) most days of the week. Activities may include walking, swimming, or biking.  Work with your health care provider or diet and nutrition specialist (dietitian) to adjust your eating plan to your individual calorie needs. Reading food labels   Check food labels for the amount of sodium per serving. Choose foods with less than 5 percent of the Daily Value of sodium.  Generally, foods with less than 300 mg of sodium per serving fit into this eating plan.  To find whole grains, look for the word "whole" as the first word in the ingredient list. Shopping  Buy products labeled as "low-sodium" or "no salt added."  Buy fresh foods. Avoid canned foods and premade or frozen meals. Cooking  Avoid adding salt when cooking. Use salt-free seasonings or herbs instead of table salt or sea salt. Check with your health care provider or pharmacist before using salt substitutes.  Do not fry foods. Cook foods using healthy methods such as baking, boiling, grilling, and broiling instead.  Cook with heart-healthy oils, such as olive, canola, soybean, or sunflower oil. Meal planning  Eat a balanced diet that includes: ? 5 or more servings of fruits and vegetables each day. At each meal, try to fill half of your plate with fruits and vegetables. ? Up to 6-8 servings of whole grains each day. ? Less than 6 oz of lean meat, poultry, or fish each day. A 3-oz serving of meat is about the same size as a deck of cards. One egg equals 1 oz. ? 2 servings of low-fat dairy each day. ? A serving of nuts, seeds, or  beans 5 times each week. ? Heart-healthy fats. Healthy fats called Omega-3 fatty acids are found in foods such as flaxseeds and coldwater fish, like sardines, salmon, and mackerel.  Limit how much you eat of the following: ? Canned or prepackaged foods. ? Food that is high in trans fat, such as fried foods. ? Food that is high in saturated fat, such as fatty meat. ? Sweets, desserts, sugary drinks, and other foods with added sugar. ? Full-fat dairy products.  Do not salt foods before eating.  Try to eat at least 2 vegetarian meals each week.  Eat more home-cooked food and less restaurant, buffet, and fast food.  When eating at a restaurant, ask that your food be prepared with less salt or no salt, if possible. What foods are recommended? The items listed may not  be a complete list. Talk with your dietitian about what dietary choices are best for you. Grains Whole-grain or whole-wheat bread. Whole-grain or whole-wheat pasta. Tony Granquist rice. Orpah Cobb. Bulgur. Whole-grain and low-sodium cereals. Pita bread. Low-fat, low-sodium crackers. Whole-wheat flour tortillas. Vegetables Fresh or frozen vegetables (raw, steamed, roasted, or grilled). Low-sodium or reduced-sodium tomato and vegetable juice. Low-sodium or reduced-sodium tomato sauce and tomato paste. Low-sodium or reduced-sodium canned vegetables. Fruits All fresh, dried, or frozen fruit. Canned fruit in natural juice (without added sugar). Meat and other protein foods Skinless chicken or [place]. Ground chicken or [place]. Pork with fat trimmed off. Fish and seafood. Egg whites. Dried beans, peas, or lentils. Unsalted nuts, nut butters, and seeds. Unsalted canned beans. Lean cuts of beef with fat trimmed off. Low-sodium, lean deli meat. Dairy Low-fat (1%) or fat-free (skim) milk. Fat-free, low-fat, or reduced-fat cheeses. Nonfat, low-sodium ricotta or cottage cheese. Low-fat or nonfat yogurt. Low-fat, low-sodium cheese. Fats and oils Soft margarine without trans fats. Vegetable oil. Low-fat, reduced-fat, or light mayonnaise and salad dressings (reduced-sodium). Canola, safflower, olive, soybean, and sunflower oils. Avocado. Seasoning and other foods Herbs. Spices. Seasoning mixes without salt. Unsalted popcorn and pretzels. Fat-free sweets. What foods are not recommended? The items listed may not be a complete list. Talk with your dietitian about what dietary choices are best for you. Grains Baked goods made with fat, such as croissants, muffins, or some breads. Dry pasta or rice meal packs. Vegetables Creamed or fried vegetables. Vegetables in a cheese sauce. Regular canned vegetables (not low-sodium or reduced-sodium). Regular canned tomato sauce and paste (not low-sodium or reduced-sodium). Regular  tomato and vegetable juice (not low-sodium or reduced-sodium). Rosita Fire. Olives. Fruits Canned fruit in a light or heavy syrup. Fried fruit. Fruit in cream or butter sauce. Meat and other protein foods Fatty cuts of meat. Ribs. Fried meat. Tomasa Blase. Sausage. Bologna and other processed lunch meats. Salami. Fatback. Hotdogs. Bratwurst. Salted nuts and seeds. Canned beans with added salt. Canned or smoked fish. Whole eggs or egg yolks. Chicken or [place] with skin. Dairy Whole or 2% milk, cream, and half-and-half. Whole or full-fat cream cheese. Whole-fat or sweetened yogurt. Full-fat cheese. Nondairy creamers. Whipped toppings. Processed cheese and cheese spreads. Fats and oils Butter. Stick margarine. Lard. Shortening. Ghee. Bacon fat. Tropical oils, such as coconut, palm kernel, or palm oil. Seasoning and other foods Salted popcorn and pretzels. Onion salt, garlic salt, seasoned salt, table salt, and sea salt. Worcestershire sauce. Tartar sauce. Barbecue sauce. Teriyaki sauce. Soy sauce, including reduced-sodium. Steak sauce. Canned and packaged gravies. Fish sauce. Oyster sauce. Cocktail sauce. Horseradish that you find on the shelf. Ketchup. Mustard. Meat flavorings and tenderizers. Bouillon  cubes. Hot sauce and Tabasco sauce. Premade or packaged marinades. Premade or packaged taco seasonings. Relishes. Regular salad dressings. Where to find more information:  National Heart, Lung, and Blood Institute: PopSteam.iswww.nhlbi.nih.gov  American Heart Association: www.heart.org Summary  The DASH eating plan is a healthy eating plan that has been shown to reduce high blood pressure (hypertension). It may also reduce your risk for type 2 diabetes, heart disease, and stroke.  With the DASH eating plan, you should limit salt (sodium) intake to 2,300 mg a day. If you have hypertension, you may need to reduce your sodium intake to 1,500 mg a day.  When on the DASH eating plan, aim to eat more fresh fruits and  vegetables, whole grains, lean proteins, low-fat dairy, and heart-healthy fats.  Work with your health care provider or diet and nutrition specialist (dietitian) to adjust your eating plan to your individual calorie needs. This information is not intended to replace advice given to you by your health care provider. Make sure you discuss any questions you have with your health care provider. Document Revised: 05/29/2017 Document Reviewed: 06/09/2016 Elsevier Patient Education  2020 [employer]

## 2020-01-27 NOTE — Progress Notes (Signed)
Patient had an appt on 01/18/2020 with Sara Beth Brown, PharmD. 

## 2020-01-30 ENCOUNTER — Encounter: Payer: Self-pay | Admitting: Cardiovascular Disease

## 2020-01-30 ENCOUNTER — Ambulatory Visit (INDEPENDENT_AMBULATORY_CARE_PROVIDER_SITE_OTHER): Payer: Medicare Other | Admitting: Cardiovascular Disease

## 2020-01-30 ENCOUNTER — Other Ambulatory Visit: Payer: Self-pay

## 2020-01-30 VITALS — BP 114/52 | HR 80 | Ht 69.0 in | Wt 167.8 lb

## 2020-01-30 DIAGNOSIS — I2510 Atherosclerotic heart disease of native coronary artery without angina pectoris: Secondary | ICD-10-CM | POA: Diagnosis not present

## 2020-01-30 DIAGNOSIS — I5042 Chronic combined systolic (congestive) and diastolic (congestive) heart failure: Secondary | ICD-10-CM

## 2020-01-30 DIAGNOSIS — I255 Ischemic cardiomyopathy: Secondary | ICD-10-CM

## 2020-01-30 MED ORDER — ROSUVASTATIN CALCIUM 10 MG PO TABS
10.0000 mg | ORAL_TABLET | Freq: Every day | ORAL | 3 refills | Status: DC
Start: 2020-01-30 — End: 2020-04-02

## 2020-01-30 NOTE — Progress Notes (Signed)
Cardiology Office Note   Date:  01/30/2020   ID:  Jeffrey Parsons, DOB 1950/07/04, MRN [id number]  PCP:  Abigail Miyamoto, MD  Cardiologist:   Kristeen Miss, MD   Chief Complaint  Patient presents with  . Congestive Heart Failure  . Hypertension  . Coronary Artery Disease   Problem lis 1. CAD - CABG atg 85 at [facility] ( 2002)  2. Diabetes 3. Pacer  4. Hyperlipidemia  5. Parkinsons disease 6. Asthma     Jeffrey Parsons is a 69 y.o. male who presents for further evaluation of his CAD CABG in 2001 at [place]  - age 65   Pacer in 2011 Recently moved to [place].  estabilshing care here   No angina .  Operated a [employer] for 40 years.  Now retired.  Exercises regularly   Nov. 82018:  Doing well.   No Cp or dyspea,  No cough or cold symptoms Goes to  the gym 5 days a week  On his own, he  Decreased the atorvastatin to 20   December 14, 2017: Doing well.  Has established with Dr. Graciela Husbands for EP . [employer] 5 days a week .  Does the bike, treadmill , weight machines  Hx of hyperlipidemia  BP is a bit low today  - is on Sinamet  Does not check it at home .   Eats regularly   July 30, 2018: Jeffrey Parsons is seen today for follow-up of his coronary artery disease.  He has a history of sick sinus syndrome and has a pacemaker / ICD.  He works out on a daily basis. Has chronic combined systolic and diastolic congestive heart failure.  His ejection fraction is 15 to 20%.   He has grade 3   diastolic filling. He has been started on Entresto 24-26 Lasix was increased to 40 mg a day  I plan on increasing the entresto to 49-51 BID and decrease the lasix to 20 mg a day     May 30, 2019:  [name] is seen today for follow-up of his chronic systolic congestive heart failure, coronary artery disease and sick sinus syndrome. We increased his Entresto to 49-51 at his last visit.  He seems to be tolerating it very well and feels good. Also has parkinsons which seems to  be gradually progression .   Is on Sinemet   Jan. 27, 2021: Jeffrey Parsons is doing ok -he has a history of coronary artery disease-status post coronary artery bypass grafting around 2001.  He has developed congestive heart failure.  He now has a biventricular ICD and has been on standard CHF medications.  Forgot his nightly dose of entresto for a week but has caught back up  Echocardiogram in December, 2020 reveals a persistently and severely reduced left ventricular systolic function.  The ejection fraction is 20 to 25%.  January 30, 2020: Jeffrey Parsons is seen back today for follow-up of his chronic systolic congestive heart failure, CAD, sick sinus syndrome.  He is on middle dose Entresto.  He has a biventricular ICD. He has Parkinson's disease. Has a tremor of right hand  No CP ,  No dyspnea.  Gets some exercise   - goes to [employer] 4 days a week   Past Medical History:  Diagnosis Date  . AICD (automatic cardioverter/defibrillator) present   . Asthma   . Chronic systolic CHF (congestive heart failure) (HCC) 05/26/2017   Echo 11/19: EF 15-20, diff HK, Gr 3 DD, mod MR, severe  LAE, mod RV dilation, mod reduced RVSF, mild TR, mild PI  . Complete heart block (HCC) 05/26/2017  . Coronary artery disease involving native coronary artery of native heart without angina pectoris 09/12/2016  . Hypertension   . Ischemic cardiomyopathy 05/26/2017  . Pacemaker 05/26/2017  . Pneumonia 2019  . Retinopathy     Past Surgical History:  Procedure Laterality Date  . BI-VENTRICULAR IMPLANTABLE CARDIOVERTER DEFIBRILLATOR UPGRADE  06/25/2018  . BIV UPGRADE N/A 06/25/2018   Procedure: BIV ICD UPGRADE;  Surgeon: Duke Salvia, MD;  Location: [facility] INVASIVE CV LAB;  Service: Cardiovascular;  Laterality: N/A;  . CARDIAC CATHETERIZATION  2001; ~ 2013  . CATARACT EXTRACTION W/ INTRAOCULAR LENS  IMPLANT, BILATERAL Bilateral   . CORONARY ARTERY BYPASS GRAFT  2001   "CABG X5"  . INSERT / REPLACE / REMOVE PACEMAKER   05/2010  . KNEE SURGERY Left    AS A CHILD  . RIGHT/LEFT HEART CATH AND CORONARY/GRAFT ANGIOGRAPHY N/A 06/16/2017   Procedure: RIGHT/LEFT HEART CATH AND CORONARY/GRAFT ANGIOGRAPHY;  Surgeon: Tonny Bollman, MD;  Location: [facility] INVASIVE CV LAB;  Service: Cardiovascular;  Laterality: N/A;  . TONSILLECTOMY       Current Outpatient Medications  Medication Sig Dispense Refill  . acetaminophen (TYLENOL) 500 MG tablet Take 500 mg by mouth daily as needed for moderate pain or headache.     . albuterol (PROAIR HFA) 108 (90 Base) MCG/ACT inhaler Inhale 2 puffs into the lungs every 6 (six) hours as needed for wheezing or shortness of breath.    [name] aspirin EC 81 MG tablet Take 1 tablet (81 mg total) by mouth daily.    . carbidopa-levodopa (SINEMET CR) 50-200 MG tablet Take 1 tablet by mouth every evening.     . carbidopa-levodopa (SINEMET IR) 25-100 MG tablet Take 2 tablets by mouth 4 (four) times daily.     . carvedilol (COREG) 25 MG tablet Take 1 tablet (25 mg total) by mouth 2 (two) times daily. 180 tablet 3  . Continuous Blood Gluc Sensor (DEXCOM G6 SENSOR) MISC 1 each by Does not apply route once a week. 9 each 4  . insulin glargine (LANTUS SOLOSTAR) 100 UNIT/ML Solostar Pen Inject 30 Units into the skin daily. Stop insulin 70/30, keep check in sugars 15 mL 11  . Insulin Pen Needle (PEN NEEDLES 5/16") 30G X 8 MM MISC by Does not apply route.    . metFORMIN (GLUCOPHAGE) 500 MG tablet Take 1 tablet (500 mg total) by mouth 2 (two) times daily with a meal. Start this with new insulin 180 tablet 2  . Multiple Vitamin (MULTIVITAMIN) tablet Take 1 tablet by mouth daily.    . sacubitril-valsartan (ENTRESTO) 49-51 MG Take 1 tablet by mouth 2 (two) times daily. 180 tablet 3  . topiramate (TOPAMAX) 50 MG tablet Take 50 mg by mouth 2 (two) times daily.    . vitamin B-12 (CYANOCOBALAMIN) 1000 MCG tablet Take 1,000 mcg by mouth daily.    . vitamin C (ASCORBIC ACID) 500 MG tablet Take 1,000 mg by mouth daily.      . rosuvastatin (CRESTOR) 10 MG tablet Take 1 tablet (10 mg total) by mouth daily. 90 tablet 3   No current facility-administered medications for this visit.    PAD Screen 09/12/2016  Previous PAD dx? No  Previous surgical procedure? No  Pain with walking? No  Feet/toe relief with dangling? No  Painful, non-healing ulcers? No  Extremities discolored? No      Allergies:   Onion  and Sulfa antibiotics    Social History:  The patient  reports that he has never smoked. He has never used smokeless tobacco. He reports that he does not drink alcohol and does not use drugs.   Family History:  The patient's family history includes Bronchitis in his mother; Coronary artery disease in his father; Heart disease in his father; Hypertension in his brother and sister.    ROS:  Please see the history of present illness.   Physical Exam: Blood pressure (!) 114/52, pulse 80, height 5\' 9"  (1.753 m), weight 167 lb 12.2 oz (76.1 kg), SpO2 98 %.  GEN:  Elderly male,  NAD  HEENT: Normal NECK: No JVD; No carotid bruits LYMPHATICS: No lymphadenopathy CARDIAC: RRR , no murmurs, rubs, gallops RESPIRATORY:  Clear to auscultation without rales, wheezing or rhonchi  ABDOMEN: Soft, non-tender, non-distended MUSCULOSKELETAL:  No edema; No deformity  SKIN: Warm and dry NEUROLOGIC:  Alert and oriented x 3, fine cremor of right hand     EKG:      Recent Labs: 01/17/2020: ALT 4; BUN 26; Creatinine, Ser 1.10; Hemoglobin 13.7; Platelets 165; Potassium 4.5; Sodium 141    Lipid Panel    Component Value Date/Time   CHOL 166 01/17/2020 0826   TRIG 89 01/17/2020 0826   HDL 42 01/17/2020 0826   CHOLHDL 4.0 01/17/2020 0826   LDLCALC 107 (H) 01/17/2020 0826      Wt Readings from Last 3 Encounters:  01/30/20 167 lb 12.2 oz (76.1 kg)  01/17/20 166 lb 6.4 oz (75.5 kg)  09/13/19 176 lb 12.8 oz (80.2 kg)      Other studies Reviewed: Additional studies/ records that were reviewed today include:  . Review of the above records demonstrates:    ASSESSMENT AND PLAN:  1.  Chronic systolic congestive heart failure.  On entresto,  Has a BI-V pacer . No significant symptoms   2.  CAD -  Hx of CABG.  No angina   3. Hyperlipidemia:    - LDL is 109.   Will start crestor 10 mg a day .   Draw lipids. Liver, bmp in 3 months in [place]   3. Essential hypertension:      BP is well controlled.   4.  Sick sinus syndrome  - s/p pacer.     09/15/19, MD  01/30/2020 11:04 AM    [facility] [address] [place], [place], [place]  [place] Phone: [phone]; Fax: [phone]

## 2020-01-30 NOTE — Patient Instructions (Signed)
Medication Instructions:  1) START ROSUVASTATIN 10 mg daily *If you need a refill on your cardiac medications before your next appointment, please call your pharmacy*  Lab Work: Your provider recommends that you have FASTING lab work in 3 months. If you have labs (blood work) drawn today and your tests are completely normal, you will receive your results only by: [name] MyChart Message (if you have MyChart) OR . A paper copy in the mail If you have any lab test that is abnormal or we need to change your treatment, we will call you to review the results.  Follow-Up: At [facility], you and your health needs are our priority.  As part of our continuing mission to provide you with exceptional heart care, we have created designated Provider Care Teams.  These Care Teams include your primary Cardiologist (physician) and Advanced Practice Providers (APPs -  Physician Assistants and Nurse Practitioners) who all work together to provide you with the care you need, when you need it. Your next appointment:   6 month(s) The format for your next appointment:   In Person Provider:   You may see Kristeen Miss, MD or one of the following Advanced Practice Providers on your designated Care Team:    Tereso Newcomer, PA-C  [place], [place]

## 2020-02-01 ENCOUNTER — Telehealth: Payer: Self-pay

## 2020-02-01 NOTE — Chronic Care Management (AMB) (Signed)
Patient called and stated he is having some low blood sugars on current regimen of Lantus 30 units daily and Metformin 500 mg bid. Recent fasting blood sugars have been in the 60s with as low as 50s overnight. Patient is using Freestyle Libre 2 CGM at this time.   Recent readings:   fasting am - 64, 69, 67, 65, 71, 108  lunch - 108, 139, 229, 114, 101, 85  dinner - 117, 144, 204, 171, 116  bedtime - 180, 141, 266, 153, 102  Genelle CDE from [employer] has been assisting with patient's blood sugar management as well. Her recommendation is reduce Lantus dose to 25 units daily and continue Metformin 500 mg bid.   I am happy to let patient know your recommendation.    Juliane Lack, PharmD, [facility] Clinical Pharmacist [facility] [phone] (office) [phone] (mobile)

## 2020-02-02 NOTE — Chronic Care Management (AMB) (Signed)
Chronic Care Management Pharmacy  Name: Jeffrey Parsons  MRN: [id number] DOB: December 12, 1950   Chief Complaint/ HPI  Jeffrey Parsons,  69 y.o. , male presents for their Follow-Up CCM visit with the clinical pharmacist In office.  PCP : [name], MD  Their chronic conditions include: CAD, HTN, mild intermittent asthma, essential tremor, parkinson's disease, HLD, DM.   Office Visits: 01/17/2020 - CBC normal, blood sugar 201, kidney and liver tests ok, LDL cholesterol high 107 consider statin, A1c 7.5 09/13/2019 - CBC normal, glucose 201, kidney and liver tests normal, cholesterol normal, A1c 8.2 high need to increase lantus to 30 units at night.  Consult Visit: 01/30/2020 - Cardio- Rosuvastatin 10 mg daily prescribed due to LDL 109. Draw lipid and liver panel in 3 months with PCP.  01/11/2020 - remote pacemaker check.  10/12/2019 - remote pacemaker check.  08/04/2019 - Optometry - Diabetes eye exam.  07/27/2019 - Cardiology [place] - continue current meds. Sinus tachycardia ivabradine is an option but not inclined to prescribe due to Parkinson's- max dose of carvedilol already.  07/27/2019 - Cardiology [place]  - BP well controlled. Lipid labs checked at next visit. Non angina Medications: Outpatient Encounter Medications as of 02/03/2020  Medication Sig  . acetaminophen (TYLENOL) 500 MG tablet Take 500 mg by mouth daily as needed for moderate pain or headache.   . albuterol (PROAIR HFA) 108 (90 Base) MCG/ACT inhaler Inhale 2 puffs into the lungs every 6 (six) hours as needed for wheezing or shortness of breath.  [name] aspirin EC 81 MG tablet Take 1 tablet (81 mg total) by mouth daily.  . carbidopa-levodopa (SINEMET CR) 50-200 MG tablet Take 1 tablet by mouth every evening.   . carbidopa-levodopa (SINEMET IR) 25-100 MG tablet Take 2 tablets by mouth 4 (four) times daily.   . carvedilol (COREG) 25 MG tablet Take 1 tablet (25 mg total) by mouth 2 (two) times daily.  .  Continuous Blood Gluc Sensor (FREESTYLE LIBRE 2 SENSOR) MISC by Does not apply route.  . insulin glargine (LANTUS SOLOSTAR) 100 UNIT/ML Solostar Pen Inject 30 Units into the skin daily. Stop insulin 70/30, keep check in sugars (Patient taking differently: Inject 25 Units into the skin daily. Stop insulin 70/30, keep check in sugars)  . Insulin Pen Needle (PEN NEEDLES 5/16") 30G X 8 MM MISC by Does not apply route.  . metFORMIN (GLUCOPHAGE) 500 MG tablet Take 1 tablet (500 mg total) by mouth 2 (two) times daily with a meal. Start this with new insulin  . Multiple Vitamin (MULTIVITAMIN) tablet Take 1 tablet by mouth daily.  . rosuvastatin (CRESTOR) 10 MG tablet Take 1 tablet (10 mg total) by mouth daily.  . sacubitril-valsartan (ENTRESTO) 49-51 MG Take 1 tablet by mouth 2 (two) times daily.  [name] topiramate (TOPAMAX) 50 MG tablet Take 50 mg by mouth 2 (two) times daily.  . vitamin B-12 (CYANOCOBALAMIN) 1000 MCG tablet Take 1,000 mcg by mouth daily.  . vitamin C (ASCORBIC ACID) 500 MG tablet Take 1,000 mg by mouth daily.   . Continuous Blood Gluc Sensor (DEXCOM G6 SENSOR) MISC 1 each by Does not apply route once a week. (Patient not taking: Reported on 02/03/2020)   No facility-administered encounter medications on file as of 02/03/2020.   Allergies  Allergen Reactions  . Onion     Upset stomach  . Sulfa Antibiotics Other (See Comments)    FLUSH    SDOH Screenings   Alcohol Screen:   . Last  Alcohol Screening Score (AUDIT):   Depression (PHQ2-9): Low Risk   . PHQ-2 Score: 2  Financial Resource Strain:   . Difficulty of Paying Living Expenses:   Food Insecurity: No Food Insecurity  . Worried About Charity fundraiser in the Last Year: Never true  . Ran Out of Food in the Last Year: Never true  Housing: Low Risk   . Last Housing Risk Score: 0  Physical Activity: Insufficiently Active  . Days of Exercise per Week: 3 days  . Minutes of Exercise per Session: 30 min  Social Connections:   .  Frequency of Communication with Friends and Family:   . Frequency of Social Gatherings with Friends and Family:   . Attends Religious Services:   . Active Member of Clubs or Organizations:   . Attends Archivist Meetings:   [name] Marital Status:   Stress:   . Feeling of Stress :   Tobacco Use: Low Risk   . Smoking Tobacco Use: Never Smoker  . Smokeless Tobacco Use: Never Used  Transportation Needs: No Transportation Needs  . Lack of Transportation (Medical): No  . Lack of Transportation (Non-Medical): No     Current Diagnosis/Assessment:  Goals Addressed            This Visit's Progress   . Pharmacy Care Plan       CARE PLAN ENTRY (see longitudinal plan of care for additional care plan information)  Current Barriers:  . Chronic Disease Management support, education, and care coordination needs related to Hyperlipidemia, Diabetes, and Heart Failure   Heart Failure BP Readings from Last 3 Encounters:  01/17/20 (!) 116/58  09/13/19 124/80  07/27/19 122/60   . Pharmacist Clinical Goal(s): o Over the next 90 days, patient will work with PharmD and providers to maintain BP goal <130/80 . Current regimen:  o Carvedilol 25 mg twice daily o Entresto 49-51 mg twice daily  . Interventions: o Patient's Entresto arrived via patient assistance (approved through 06/29/2020). Pharmacist will work on Production assistant, radio for next year once current approval expired.  . Patient self care activities - Over the next 90 days, patient will: o Check BP weekly, document, and provide at future appointments o Ensure daily salt intake < 2300 mg/day  Hyperlipidemia Lab Results  Component Value Date/Time   LDLCALC 107 (H) 01/17/2020 08:26 AM   . Pharmacist Clinical Goal(s): o Over the next 90 days, patient will work with PharmD and providers to achieve LDL goal < 70 . Current regimen:  o Rosuvastatin 10 mg daily . Interventions: o Discussed benefits of statin and encouraged to  continue taking statin as prescribed from cardiology.  o Keep up the good work with exercise.  o Incorporate lean meats, vegetables, fruits and whole grains in diet.  . Patient self care activities - Over the next 90 days, patient will: o Contact provider or pharmacist with any questions or concerns.  o Consider trying a statin medication three times weekly.   Diabetes Lab Results  Component Value Date/Time   HGBA1C 7.5 (H) 01/17/2020 08:26 AM   HGBA1C 8.2 (H) 09/13/2019 09:13 AM   . Pharmacist Clinical Goal(s): o Over the next 90 days, patient will work with PharmD and providers to achieve A1c goal <7% . Current regimen:  o Metformin 500 mg twice daily with meals o Lantus 25 units daily . Interventions: o Continue to work to get approval for a CGM. Pharmacist spoke with [employer] 02/02/2020. They verified benefits for [facility]  2. Patient is not eligible for Dexcom at this time due to not injecting four times daily.  o Patient approved for Lantus patient assistance through 06/29/2020. Pharmacist will work with patient to renew application at the end of 2021.  o Patient had frequent overnight low blood sugars on 30 units of Lantus - dose was decreased to 25 units 08/04. Patient reports improved fasting blood sugar of 93 on 08/06.  o Patient provided with Libre 2 CGM sample in office until prescription arrives.  . Patient self care activities - Over the next 90 days, patient will: o Check blood sugar 3-4 times daily, document, and provide at future appointments o Contact provider with any episodes of hypoglycemia  Medication management . Pharmacist Clinical Goal(s): o Over the next 90 days, patient will work with PharmD and providers to maintain optimal medication adherence . Current pharmacy: [facility] . Interventions o Comprehensive medication review performed. o Continue current medication management strategy . Patient self care activities - Over the next 90 days,  patient will: o Focus on medication adherence by using a pill box o Take medications as prescribed o Report any questions or concerns to PharmD and/or provider(s)  Please see past updates related to this goal by clicking on the "Past Updates" button in the selected goal        Asthma   Eosinophil count:  No results found for: EOSPCT%                               Eos (Absolute):  Lab Results  Component Value Date/Time   EOSABS 0.3 01/17/2020 08:26 AM    Tobacco Status:  Social History   Tobacco Use  Smoking Status Never Smoker  Smokeless Tobacco Never Used    Patient has failed these meds in past: none reported Patient is currently controlled on the following medications:   albuterol inhaler 2 puffs q6h prn wheezing or shortness of breath Using maintenance inhaler regularly? No Frequency of rescue inhaler use:  infrequently  We discussed:  proper inhaler technique  Plan  Continue current medications ,  Hyperlipidemia   LDL goal < 70  Lipid Panel     Component Value Date/Time   CHOL 166 01/17/2020 0826   TRIG 89 01/17/2020 0826   HDL 42 01/17/2020 0826   LDLCALC 107 (H) 01/17/2020 0826    Hepatic Function Latest Ref Rng & Units 01/17/2020 09/13/2019 12/16/2016  Total Protein 6.0 - 8.5 g/dL 6.8 6.1 6.5  Albumin 3.8 - 4.8 g/dL 4.1 4.1 4.1  AST 0 - 40 IU/L 15 15 20   ALT 0 - 44 IU/L 4 5 14   Alk Phosphatase 48 - 121 IU/L 114 132(H) 128(H)  Total Bilirubin 0.0 - 1.2 mg/dL 0.7 0.7 0.8     The 10-year ASCVD risk score Mikey Bussing DC Jr., et al., 2013) is: 28.4%   Values used to calculate the score:     Age: 63 years     Sex: Male     Is Non-Hispanic African American: No     Diabetic: Yes     Tobacco smoker: No     Systolic Blood Pressure: 409 mmHg     Is BP treated: Yes     HDL Cholesterol: 42 mg/dL     Total Cholesterol: 166 mg/dL   Patient has failed these meds in past: Atorvastatin  Patient is currently uncontrolled on the following medications:  . Aspirin  ec 81 mg  daily . Rosuvastatin 10 mg daily  We discussed:  diet and exercise extensively. Patient has been recommended to begin a statin for cholesterol. Patient's ASCVD risk is elevated and his LDL is above goal. Patient has tried/failed atorvastatin in the past. Pharmacist will encourage statin on follow-up visit.   Update 02/03/2020 - Cardiology began rosuvastatin 10 mg daily. Pharmacist encouraged patient of the benefits of taking statin. Will review lipid, liver and BMP in 3 months. Patient continues to exercise 4 times weekly.   Plan  Continue current medications.    Diabetes   Recent Relevant Labs: Lab Results  Component Value Date/Time   HGBA1C 7.5 (H) 01/17/2020 08:26 AM   HGBA1C 8.2 (H) 09/13/2019 09:13 AM   MICROALBUR 80 01/17/2020 08:38 AM     Checking BG: 3x per Day  Recent FBG Readings:64-280 Recent pre-meal BG readings: 95-420 Recent 2hr PP BG readings:  125-363 Patient has failed these meds in past: Novolog, novolin 70/30,  Patient is currently uncontrolled on the following medications:   Lantus solostar 30 units daily  Metformin 500 mg bid with a meal  insuln pen needles   Last diabetic Foot exam: No results found for: HMDIABEYEEXA  Last diabetic Eye exam: No results found for: HMDIABFOOTEX   We discussed: diet and exercise extensively. Patient has had some recent fluctuations in blood sugar. Patient can explain some recent high blood sugars with things like tortilla chips, chicken pot pie, biscuit for breakfast and McDonald's. Patient exercises at the gym 3-5 times each week. He carries a juicy juice box with him now in the event of low blood sugar. Low blood sugars are improved from previous regimen. Patient's wife is working to help with the diet piece based on education from [place]. Provided patient with a sample Dexcom CGM in office due to dexterity.   Update 02/03/2020 - Insurance denied Dexcom due to not injecting four times daily. Patient currently  using sample of Libre 2 at home. Pharmacist contacted [employer] for benefit verification of Libre 2. Patient's insurance covers Tensed 2. Patient began Lantus 30 units daily and Metformin 500 mg bid with a meal. He had some overnight blood sugars in the 50s and fasting readings that were below goal. He was symptomatic jittery/shaking. Pharmacist contacted Dr. [name] and Colvin Caroli with udpate. Lantus was reduced to 25 units daily 08/04. Since reduction patient's blood sugars are improved. Fasting 08/06 93 and patient feels fine. Did not dip low overnight. Patient's blood sugar 08/05 - am 66/ lunch 129/supper 135/bedtime 178.   Discussed next month we will consider increasing metformin dose if tolerating well and further reducing Lantus to ensure reduced risk of low blood sugar. Patient is correcting appropriately with 1/2 cup of juice during low blood sugar events. Patient takes juices with him to the gym to ensure that his blood sugar doesn't become dangerously low. Exercise seems to reduce blood sugar by at least 20 mg/dL.   Plan  Continue current medications and   Heart Failure   Type: Combined Systolic and Diastolic  Last ejection fraction: 20-25% NYHA Class: III (marked limitation of activity)  BP today is:  <130/80  Office blood pressures are  BP Readings from Last 3 Encounters:  01/30/20 (!) 114/52  01/17/20 (!) 116/58  09/13/19 124/80    Patient has failed these meds in the past: furosemide, hydrochlorothiazide, losartan, losartan-hydrochlorothiazide, metoprolol Patient is currently controlled on the following medications:   Carvedilol 25 mg bid  Entresto 49-51 mg bid  Patient checks  BP at home weekly  Patient home BP readings are ranging: at or below goal  We discussed diet and exercise extensively. Patient encouraged to consume diet of lean protein, vegetables, fruits in moderation and whole grains. Patient is in the coverage gap and Delene Loll is quite expensive.  Pharmacist working with patient assistance to obtain medication for patient.   02/03/2020 - Patient received Entresto through patient assistance. Approved through 06/29/2020. Pharmacist will help coordinate renewal at the end of the year.   Plan  Continue current medications   Parkinson's Disease   Patient has failed these meds in past: n/a Patient is currently uncontrolled on the following medications:  . Carbidopa-levodopa CR 50-200 mg every evening . Carbidopa-levodopa IR 25-100 2 tablets QID . Topiramate 50 mg bid  We discussed:  Patient is hoping there is something the neurologist can do at next visit to improve symptoms. He is having difficulty checking his blood sugar and completing some tasks due to tremor. Wife states that they can tell an improvement since he has been able to resume exercise in the gym post Covid. Days that symptoms are worse they try to get out and ride or walk.   Plan  Continue current medications and follow-up with neurologist   Health Maintenance   Patient is currently controlled on the following medications:  . Acetaminophen 500 mg daily prn moderate pain or headache . Vitamin b-12 1000 mcg daily - supplementation . Multiplevitamin daily - supplementation . Vitamin C 1000 mg daily - supplementation  We discussed:  Patient rarely take tylenol. Overall pleased with medication regimen.   Plan  Continue current medications  Vaccines   Reviewed and discussed patient's vaccination history.  Will discuss further vaccines at future visits.   Immunization History  Administered Date(s) Administered  . Moderna SARS-COVID-2 Vaccination 08/03/2019, 08/31/2019    Plan  Recommended patient receive annual flu vaccine in office.   Medication Management   [facility] Uses pill box? Yes Pt endorses excellent compliance  We discussed: Working to avoid doughnut hole in the future and making medications more affordable.    Plan  Continue current medication management strategy    Follow up: 1 month phone visit

## 2020-02-03 ENCOUNTER — Ambulatory Visit: Payer: Medicare Other

## 2020-02-03 DIAGNOSIS — E1169 Type 2 diabetes mellitus with other specified complication: Secondary | ICD-10-CM

## 2020-02-03 DIAGNOSIS — E782 Mixed hyperlipidemia: Secondary | ICD-10-CM

## 2020-02-03 NOTE — Patient Instructions (Addendum)
Visit Information  Goals Addressed            This Visit's Progress   . Pharmacy Care Plan       CARE PLAN ENTRY (see longitudinal plan of care for additional care plan information)  Current Barriers:  . Chronic Disease Management support, education, and care coordination needs related to Hyperlipidemia, Diabetes, and Heart Failure   Heart Failure BP Readings from Last 3 Encounters:  01/17/20 (!) 116/58  09/13/19 124/80  07/27/19 122/60   . Pharmacist Clinical Goal(s): o Over the next 90 days, patient will work with PharmD and providers to maintain BP goal <130/80 . Current regimen:  o Carvedilol 25 mg twice daily o Entresto 49-51 mg twice daily  . Interventions: o Patient's Entresto arrived via patient assistance (approved through 06/29/2020). Pharmacist will work on Production assistant, radio for next year once current approval expired.  . Patient self care activities - Over the next 90 days, patient will: o Check BP weekly, document, and provide at future appointments o Ensure daily salt intake < 2300 mg/day  Hyperlipidemia Lab Results  Component Value Date/Time   LDLCALC 107 (H) 01/17/2020 08:26 AM   . Pharmacist Clinical Goal(s): o Over the next 90 days, patient will work with PharmD and providers to achieve LDL goal < 70 . Current regimen:  o Rosuvastatin 10 mg daily . Interventions: o Discussed benefits of statin and encouraged to continue taking statin as prescribed from cardiology.  o Keep up the good work with exercise.  o Incorporate lean meats, vegetables, fruits and whole grains in diet.  . Patient self care activities - Over the next 90 days, patient will: o Contact provider or pharmacist with any questions or concerns.  o Consider trying a statin medication three times weekly.   Diabetes Lab Results  Component Value Date/Time   HGBA1C 7.5 (H) 01/17/2020 08:26 AM   HGBA1C 8.2 (H) 09/13/2019 09:13 AM   . Pharmacist Clinical Goal(s): o Over the next 90  days, patient will work with PharmD and providers to achieve A1c goal <7% . Current regimen:  o Metformin 500 mg twice daily with meals o Lantus 25 units daily . Interventions: o Continue to work to get approval for a CGM. Pharmacist spoke with [employer] 02/02/2020. They verified benefits for [place]. Patient is not eligible for Dexcom at this time due to not injecting four times daily.  o Patient approved for Lantus patient assistance through 06/29/2020. Pharmacist will work with patient to renew application at the end of 2021.  o Patient had frequent overnight low blood sugars on 30 units of Lantus - dose was decreased to 25 units 08/04. Patient reports improved fasting blood sugar of 93 on 08/06.  o Patient provided with [place] 2 CGM sample in office until prescription arrives.  . Patient self care activities - Over the next 90 days, patient will: o Check blood sugar 3-4 times daily, document, and provide at future appointments o Contact provider with any episodes of hypoglycemia  Medication management . Pharmacist Clinical Goal(s): o Over the next 90 days, patient will work with PharmD and providers to maintain optimal medication adherence . Current pharmacy: [facility] . Interventions o Comprehensive medication review performed. o Continue current medication management strategy . Patient self care activities - Over the next 90 days, patient will: o Focus on medication adherence by using a pill box o Take medications as prescribed o Report any questions or concerns to PharmD and/or provider(s)  Please  see past updates related to this goal by clicking on the "Past Updates" button in the selected goal        The patient verbalized understanding of instructions provided today and declined a print copy of patient instruction materials.   Telephone follow up appointment with pharmacy team member scheduled for: 02/2020  Sherre Poot, PharmD, [facility] Clinical  Pharmacist [facility] [phone] (office) [phone] (mobile)   Hypoglycemia Hypoglycemia is when the sugar (glucose) level in your blood is too low. Signs of low blood sugar may include:  Feeling: ? Hungry. ? Worried or nervous (anxious). ? Sweaty and clammy. ? Confused. ? Dizzy. ? Sleepy. ? Sick to your stomach (nauseous).  Having: ? A fast heartbeat. ? A headache. ? A change in your vision. ? Tingling or no feeling (numbness) around your mouth, lips, or tongue. ? Jerky movements that you cannot control (seizure).  Having trouble with: ? Moving (coordination). ? Sleeping. ? Passing out (fainting). ? Getting upset easily (irritability). Low blood sugar can happen to people who have diabetes and people who do not have diabetes. Low blood sugar can happen quickly, and it can be an emergency. Treating low blood sugar Low blood sugar is often treated by eating or drinking something sugary right away, such as:  Fruit juice, 4-6 oz (120-150 mL).  Regular soda (not diet soda), 4-6 oz (120-150 mL).  Low-fat milk, 4 oz (120 mL).  Several pieces of hard candy.  Sugar or honey, 1 Tbsp (15 mL). Treating low blood sugar if you have diabetes If you can think clearly and swallow safely, follow the 15:15 rule:  Take 15 grams of a fast-acting carb (carbohydrate). Talk with your doctor about how much you should take.  Always keep a source of fast-acting carb with you, such as: ? Sugar tablets (glucose pills). Take 3-4 pills. ? 6-8 pieces of hard candy. ? 4-6 oz (120-150 mL) of fruit juice. ? 4-6 oz (120-150 mL) of regular (not diet) soda. ? 1 Tbsp (15 mL) honey or sugar.  Check your blood sugar 15 minutes after you take the carb.  If your blood sugar is still at or below 70 mg/dL (3.9 mmol/L), take 15 grams of a carb again.  If your blood sugar does not go above 70 mg/dL (3.9 mmol/L) after 3 tries, get help right away.  After your blood sugar goes back to  normal, eat a meal or a snack within 1 hour.  Treating very low blood sugar If your blood sugar is at or below 54 mg/dL (3 mmol/L), you have very low blood sugar (severe hypoglycemia). This may also cause:  Passing out.  Jerky movements you cannot control (seizure).  Losing consciousness (coma). This is an emergency. Do not wait to see if the symptoms will go away. Get medical help right away. Call your local emergency services (911 in the [place] Do not drive yourself to the hospital. If you have very low blood sugar and you cannot eat or drink, you may need a glucagon shot (injection). A family member or friend should learn how to check your blood sugar and how to give you a glucagon shot. Ask your doctor if you need to have a glucagon shot kit at home. Follow these instructions at home: General instructions  Take over-the-counter and prescription medicines only as told by your doctor.  Stay aware of your blood sugar as told by your doctor.  Limit alcohol intake to no more than 1 drink a day  for nonpregnant women and 2 drinks a day for men. One drink equals 12 oz of beer (355 mL), 5 oz of wine (148 mL), or 1 oz of hard liquor (44 mL).  Keep all follow-up visits as told by your doctor. This is important. If you have diabetes:   Follow your diabetes care plan as told by your doctor. Make sure you: ? Know the signs of low blood sugar. ? Take your medicines as told. ? Follow your exercise and meal plan. ? Eat on time. Do not skip meals. ? Check your blood sugar as often as told by your doctor. Always check it before and after exercise. ? Follow your sick day plan when you cannot eat or drink normally. Make this plan ahead of time with your doctor.  Share your diabetes care plan with: ? Your work or school. ? People you live with.  Check your pee (urine) for ketones: ? When you are sick. ? As told by your doctor.  Carry a card or wear jewelry that says you have  diabetes. Contact a doctor if:  You have trouble keeping your blood sugar in your target range.  You have low blood sugar often. Get help right away if:  You still have symptoms after you eat or drink something sugary.  Your blood sugar is at or below 54 mg/dL (3 mmol/L).  You have jerky movements that you cannot control.  You pass out. These symptoms may be an emergency. Do not wait to see if the symptoms will go away. Get medical help right away. Call your local emergency services (911 in the [place] Do not drive yourself to the hospital. Summary  Hypoglycemia happens when the level of sugar (glucose) in your blood is too low.  Low blood sugar can happen to people who have diabetes and people who do not have diabetes. Low blood sugar can happen quickly, and it can be an emergency.  Make sure you know the signs of low blood sugar and know how to treat it.  Always keep a source of sugar (fast-acting carb) with you to treat low blood sugar. This information is not intended to replace advice given to you by your health care provider. Make sure you discuss any questions you have with your health care provider. Document Revised: 10/07/2018 Document Reviewed: 07/20/2015 Elsevier Patient Education  2020 [employer].

## 2020-02-06 ENCOUNTER — Ambulatory Visit: Payer: Medicare Other

## 2020-02-09 ENCOUNTER — Telehealth: Payer: Medicare Other

## 2020-02-09 DIAGNOSIS — Z961 Presence of intraocular lens: Secondary | ICD-10-CM | POA: Diagnosis not present

## 2020-02-09 DIAGNOSIS — E113293 Type 2 diabetes mellitus with mild nonproliferative diabetic retinopathy without macular edema, bilateral: Secondary | ICD-10-CM | POA: Diagnosis not present

## 2020-02-21 DIAGNOSIS — G20 Parkinson's disease: Secondary | ICD-10-CM | POA: Diagnosis not present

## 2020-02-21 DIAGNOSIS — G250 Essential tremor: Secondary | ICD-10-CM | POA: Diagnosis not present

## 2020-02-21 DIAGNOSIS — G248 Other dystonia: Secondary | ICD-10-CM | POA: Diagnosis not present

## 2020-02-29 ENCOUNTER — Telehealth: Payer: Self-pay

## 2020-02-29 NOTE — Progress Notes (Signed)
Left Voice message to Confirm  patient  Telephoneappointment on 03/01/2020  for CCM at 3:00 pm  with Lucia Gaskins the Clinical pharmacist.    Everlean Cherry Clinical Pharmacist Assistant [phone]

## 2020-03-01 ENCOUNTER — Ambulatory Visit: Payer: Medicare Other

## 2020-03-01 ENCOUNTER — Other Ambulatory Visit: Payer: Self-pay | Admitting: Legal Medicine

## 2020-03-01 ENCOUNTER — Other Ambulatory Visit: Payer: Self-pay

## 2020-03-01 DIAGNOSIS — E782 Mixed hyperlipidemia: Secondary | ICD-10-CM

## 2020-03-01 DIAGNOSIS — E1169 Type 2 diabetes mellitus with other specified complication: Secondary | ICD-10-CM

## 2020-03-01 DIAGNOSIS — E669 Obesity, unspecified: Secondary | ICD-10-CM

## 2020-03-01 MED ORDER — METFORMIN HCL 500 MG PO TABS: 1000.0000 mg | ORAL_TABLET | Freq: Two times a day (BID) | ORAL | 2 refills | Status: DC

## 2020-03-01 NOTE — Chronic Care Management (AMB) (Signed)
Chronic Care Management Pharmacy  Name: Malone Vanblarcom  MRN: [id number] DOB: 03-24-1951   Chief Complaint/ HPI  Jeffrey Parsons,  69 y.o. , male presents for their Follow-Up CCM visit with the clinical pharmacist via telephone due to COVID-19 Pandemic.  PCP : [name], MD  Their chronic conditions include: CAD, HTN, mild intermittent asthma, essential tremor, parkinson's disease, HLD, DM.   Office Visits: 01/17/2020 - CBC normal, blood sugar 201, kidney and liver tests ok, LDL cholesterol high 107 consider statin, A1c 7.5 09/13/2019 - CBC normal, glucose 201, kidney and liver tests normal, cholesterol normal, A1c 8.2 high need to increase lantus to 30 units at night.  Consult Visit: 02/21/2020 - Neurology - Parkinson's follow-up visit. Please keep track of when you notice the toe curling problem, and how it relates to the dosing of the medicationI will prescribe Carbidopa-Levodopa 25/250 mg tabs. Please take 1 tab four times daily INSTEAD of the 2 tablets of regular (25/100 mg tabs) carbidopa-Levodopa that you are taking now 01/30/2020 - Cardio- Rosuvastatin 10 mg daily prescribed due to LDL 109. Draw lipid and liver panel in 3 months with PCP.  01/11/2020 - remote pacemaker check.  10/12/2019 - remote pacemaker check.  08/04/2019 - Optometry - Diabetes eye exam.  07/27/2019 - Cardiology [place] - continue current meds. Sinus tachycardia ivabradine is an option but not inclined to prescribe due to Parkinson's- max dose of carvedilol already.  07/27/2019 - Cardiology [place]  - BP well controlled. Lipid labs checked at next visit. Non angina Medications: Outpatient Encounter Medications as of 03/01/2020  Medication Sig  . carbidopa-levodopa (SINEMET CR) 50-200 MG tablet Take 1 tablet by mouth every evening.   [name] acetaminophen (TYLENOL) 500 MG tablet Take 500 mg by mouth daily as needed for moderate pain or headache.   . albuterol (PROAIR HFA) 108 (90 Base)  MCG/ACT inhaler Inhale 2 puffs into the lungs every 6 (six) hours as needed for wheezing or shortness of breath.  [name] aspirin EC 81 MG tablet Take 1 tablet (81 mg total) by mouth daily.  . carbidopa-levodopa (SINEMET IR) 25-250 MG tablet Take 1 tablet by mouth 4 (four) times daily.   . carvedilol (COREG) 25 MG tablet Take 1 tablet (25 mg total) by mouth 2 (two) times daily.  . Continuous Blood Gluc Sensor (DEXCOM G6 SENSOR) MISC 1 each by Does not apply route once a week. (Patient not taking: Reported on 02/03/2020)  . Continuous Blood Gluc Sensor (FREESTYLE LIBRE 2 SENSOR) MISC by Does not apply route.  . insulin glargine (LANTUS SOLOSTAR) 100 UNIT/ML Solostar Pen Inject 30 Units into the skin daily. Stop insulin 70/30, keep check in sugars (Patient taking differently: Inject 25 Units into the skin daily. Stop insulin 70/30, keep check in sugars)  . Insulin Pen Needle (PEN NEEDLES 5/16") 30G X 8 MM MISC by Does not apply route.  . metFORMIN (GLUCOPHAGE) 500 MG tablet Take 1 tablet (500 mg total) by mouth 2 (two) times daily with a meal. Start this with new insulin  . Multiple Vitamin (MULTIVITAMIN) tablet Take 1 tablet by mouth daily.  . rosuvastatin (CRESTOR) 10 MG tablet Take 1 tablet (10 mg total) by mouth daily.  . sacubitril-valsartan (ENTRESTO) 49-51 MG Take 1 tablet by mouth 2 (two) times daily.  [name] topiramate (TOPAMAX) 50 MG tablet Take 50 mg by mouth 2 (two) times daily.  . vitamin B-12 (CYANOCOBALAMIN) 1000 MCG tablet Take 1,000 mcg by mouth daily.  . vitamin C (ASCORBIC  ACID) 500 MG tablet Take 1,000 mg by mouth daily.    No facility-administered encounter medications on file as of 03/01/2020.   Allergies  Allergen Reactions  . Onion     Upset stomach  . Sulfa Antibiotics Other (See Comments)    FLUSH    SDOH Screenings   Alcohol Screen:   . Last Alcohol Screening Score (AUDIT): Not on file  Depression (PHQ2-9): Low Risk   . PHQ-2 Score: 2  Financial Resource Strain:   .  Difficulty of Paying Living Expenses: Not on file  Food Insecurity: No Food Insecurity  . Worried About Charity fundraiser in the Last Year: Never true  . Ran Out of Food in the Last Year: Never true  Housing: Low Risk   . Last Housing Risk Score: 0  Physical Activity: Insufficiently Active  . Days of Exercise per Week: 3 days  . Minutes of Exercise per Session: 30 min  Social Connections:   . Frequency of Communication with Friends and Family: Not on file  . Frequency of Social Gatherings with Friends and Family: Not on file  . Attends Religious Services: Not on file  . Active Member of Clubs or Organizations: Not on file  . Attends Archivist Meetings: Not on file  . Marital Status: Not on file  Stress:   . Feeling of Stress : Not on file  Tobacco Use: Low Risk   . Smoking Tobacco Use: Never Smoker  . Smokeless Tobacco Use: Never Used  Transportation Needs: No Transportation Needs  . Lack of Transportation (Medical): No  . Lack of Transportation (Non-Medical): No     Current Diagnosis/Assessment:  Goals Addressed            This Visit's Progress   . Pharmacy Care Plan       CARE PLAN ENTRY (see longitudinal plan of care for additional care plan information)  Current Barriers:  . Chronic Disease Management support, education, and care coordination needs related to Hyperlipidemia, Diabetes, and Heart Failure   Heart Failure BP Readings from Last 3 Encounters:  01/17/20 (!) 116/58  09/13/19 124/80  07/27/19 122/60   . Pharmacist Clinical Goal(s): o Over the next 90 days, patient will work with PharmD and providers to maintain BP goal <130/80 . Current regimen:  o Carvedilol 25 mg twice daily o Entresto 49-51 mg twice daily  . Interventions: o Patient's Entresto arrived via patient assistance (approved through 06/29/2020). Pharmacist will work on Production assistant, radio for next year once current approval expired.  . Patient self care activities - Over  the next 90 days, patient will: o Check BP weekly, document, and provide at future appointments o Ensure daily salt intake < 2300 mg/day  Hyperlipidemia Lab Results  Component Value Date/Time   LDLCALC 107 (H) 01/17/2020 08:26 AM   . Pharmacist Clinical Goal(s): o Over the next 90 days, patient will work with PharmD and providers to achieve LDL goal < 70 . Current regimen:  o Rosuvastatin 10 mg daily . Interventions: o Discussed benefits of statin and encouraged to continue taking statin as prescribed from cardiology.  o Keep up the good work with exercise.  o Incorporate lean meats, vegetables, fruits and whole grains in diet.  . Patient self care activities - Over the next 90 days, patient will: o Contact provider or pharmacist with any questions or concerns.  o Consider trying a statin medication three times weekly.   Diabetes Lab Results  Component Value Date/Time  HGBA1C 7.5 (H) 01/17/2020 08:26 AM   HGBA1C 8.2 (H) 09/13/2019 09:13 AM   . Pharmacist Clinical Goal(s): o Over the next 90 days, patient will work with PharmD and providers to achieve A1c goal <7% . Current regimen:  o Metformin 500 mg 2 tablets each morning and 1 tablet in the evening with meals o Lantus 25 units daily . Interventions: o Reviewed home blood sugar log.  o Recommend increasing metformin dose to 1000 mg every morning and 500 mg every evening with meals.  . Patient self care activities - Over the next 90 days, patient will: o Check blood sugar 3-4 times daily, document, and provide at future appointments o Contact provider with any episodes of hypoglycemia  Medication management . Pharmacist Clinical Goal(s): o Over the next 90 days, patient will work with PharmD and providers to maintain optimal medication adherence . Current pharmacy: [facility] . Interventions o Comprehensive medication review performed. o Continue current medication management strategy . Patient self care activities -  Over the next 90 days, patient will: o Focus on medication adherence by using a pill box o Take medications as prescribed o Report any questions or concerns to PharmD and/or provider(s)  Please see past updates related to this goal by clicking on the "Past Updates" button in the selected goal        Asthma   Eosinophil count:  No results found for: EOSPCT%                               Eos (Absolute):  Lab Results  Component Value Date/Time   EOSABS 0.3 01/17/2020 08:26 AM    Tobacco Status:  Social History   Tobacco Use  Smoking Status Never Smoker  Smokeless Tobacco Never Used    Patient has failed these meds in past: none reported Patient is currently controlled on the following medications:   albuterol inhaler 2 puffs q6h prn wheezing or shortness of breath Using maintenance inhaler regularly? No Frequency of rescue inhaler use:  infrequently  We discussed:  proper inhaler technique  Plan  Continue current medications ,  Hyperlipidemia   LDL goal < 70  Lipid Panel     Component Value Date/Time   CHOL 166 01/17/2020 0826   TRIG 89 01/17/2020 0826   HDL 42 01/17/2020 0826   LDLCALC 107 (H) 01/17/2020 0826    Hepatic Function Latest Ref Rng & Units 01/17/2020 09/13/2019 12/16/2016  Total Protein 6.0 - 8.5 g/dL 6.8 6.1 6.5  Albumin 3.8 - 4.8 g/dL 4.1 4.1 4.1  AST 0 - 40 IU/L _0 ALT 0 - 44 IU/L _1 Alk Phosphatase 48 - 121 IU/L 114 132(H) 128(H)  Total Bilirubin 0.0 - 1.2 mg/dL 0.7 0.7 0.8     The 10-year ASCVD risk score Mikey Bussing DC Jr., et al., 2013) is: 28.4%   Values used to calculate the score:     Age: 66 years     Sex: Male     Is Non-Hispanic African American: No     Diabetic: Yes     Tobacco smoker: No     Systolic Blood Pressure: 664 mmHg     Is BP treated: Yes     HDL Cholesterol: 42 mg/dL     Total Cholesterol: 166 mg/dL   Patient has failed these meds in past: Atorvastatin  Patient is currently uncontrolled on the following  medications:  . Aspirin  ec 81 mg daily . Rosuvastatin 10 mg daily  We discussed:  diet and exercise extensively. Patient has been recommended to begin a statin for cholesterol. Patient's ASCVD risk is elevated and his LDL is above goal. Patient has tried/failed atorvastatin in the past. Pharmacist will encourage statin on follow-up visit.   Update 02/03/2020 - Cardiology began rosuvastatin 10 mg daily. Pharmacist encouraged patient of the benefits of taking statin. Will review lipid, liver and BMP in 3 months. Patient continues to exercise 4 times weekly.   Update 03/01/2020 - Patient tolerating rosuvastatin well.   Plan  Continue current medications.    Diabetes   Recent Relevant Labs: Lab Results  Component Value Date/Time   HGBA1C 7.5 (H) 01/17/2020 08:26 AM   HGBA1C 8.2 (H) 09/13/2019 09:13 AM   MICROALBUR 80 01/17/2020 08:38 AM     Checking BG: 3x per Day  Recent FBG Readings:64-280 Recent pre-meal BG readings: 95-420 Recent 2hr PP BG readings:  125-363 Patient has failed these meds in past: Novolog, novolin 70/30,  Patient is currently uncontrolled on the following medications:   Lantus solostar 30 units daily  Metformin 500 mg bid with a meal  insuln pen needles   Last diabetic Foot exam: No results found for: HMDIABEYEEXA  Last diabetic Eye exam: No results found for: HMDIABFOOTEX   We discussed: diet and exercise extensively. Patient has had some recent fluctuations in blood sugar. Patient can explain some recent high blood sugars with things like tortilla chips, chicken pot pie, biscuit for breakfast and McDonald's. Patient exercises at the gym 3-5 times each week. He carries a juicy juice box with him now in the event of low blood sugar. Low blood sugars are improved from previous regimen. Patient's wife is working to help with the diet piece based on education from [place]. Provided patient with a sample Dexcom CGM in office due to dexterity.   Update  02/03/2020 - Insurance denied Dexcom due to not injecting four times daily. Patient currently using sample of Libre 2 at home. Pharmacist contacted [employer] for benefit verification of Libre 2. Patient's insurance covers [place] 2. Patient began Lantus 30 units daily and Metformin 500 mg bid with a meal. He had some overnight blood sugars in the 50s and fasting readings that were below goal. He was symptomatic jittery/shaking. Pharmacist contacted Dr. [name] and Colvin Caroli with udpate. Lantus was reduced to 25 units daily 08/04. Since reduction patient's blood sugars are improved. Fasting 08/06 93 and patient feels fine. Did not dip low overnight. Patient's blood sugar 08/05 - am 66/ lunch 129/supper 135/bedtime 178.   Discussed next month we will consider increasing metformin dose if tolerating well and further reducing Lantus to ensure reduced risk of low blood sugar. Patient is correcting appropriately with 1/2 cup of juice during low blood sugar events. Patient takes juices with him to the gym to ensure that his blood sugar doesn't become dangerously low. Exercise seems to reduce blood sugar by at least 20 mg/dL.   Update 03/01/2020 - Patient's blood sugars vary from day to day.   Recent trends:  Breakfast: 59, 67, 68, 184, 245, 127, 185  Lunch: 78, 182, 104, 148, 245, 110, 269, 205  Dinner: 145, 244, 185, 229, 310, 150, 227, 205  Bedtime: 167, 148, 198, 350, 294, 197, 250, 199  Patient working on diet and exercising 5 days each week. Discussed increasing metformin. Recommend increasing metformin 500 mg- 2 tablets in the morning and 1 tablet in the evening.  Patient is willing per Dr. Blanch Media approval. [facility] Patient will monitor blood sugars and report any episode of low to pharmacist. Patient scheduled for follow-up appointment in 2 weeks.   Plan  Recommend increasing Metformin 1000 mg qam and 500 mg qpm with meals. Follow-up with pharmacist  in 2 weeks with results.   Heart Failure   Type: Combined Systolic and Diastolic  Last ejection fraction: 20-25% NYHA Class: III (marked limitation of activity)  BP today is:  <130/80  Office blood pressures are  BP Readings from Last 3 Encounters:  01/30/20 (!) 114/52  01/17/20 (!) 116/58  09/13/19 124/80    Patient has failed these meds in the past: furosemide, hydrochlorothiazide, losartan, losartan-hydrochlorothiazide, metoprolol Patient is currently controlled on the following medications:   Carvedilol 25 mg bid  Entresto 49-51 mg bid  Patient checks BP at home weekly  Patient home BP readings are ranging: at or below goal  We discussed diet and exercise extensively. Patient encouraged to consume diet of lean protein, vegetables, fruits in moderation and whole grains. Patient is in the coverage gap and Delene Loll is quite expensive. Pharmacist working with patient assistance to obtain medication for patient.   02/03/2020 - Patient received Entresto through patient assistance. Approved through 06/29/2020. Pharmacist will help coordinate renewal at the end of the year.   Plan  Continue current medications   Parkinson's Disease   Patient has failed these meds in past: n/a Patient is currently uncontrolled on the following medications:  . Carbidopa-levodopa CR 50-200 mg every evening . Carbidopa-levodopa IR 25-100 2 tablets QID . Topiramate 50 mg bid  We discussed:  Patient is hoping there is something the neurologist can do at next visit to improve symptoms. He is having difficulty checking his blood sugar and completing some tasks due to tremor. Wife states that they can tell an improvement since he has been able to resume exercise in the gym post Covid. Days that symptoms are worse they try to get out and ride or walk.   Plan  Continue current medications and follow-up with neurologist   Health Maintenance   Patient is currently controlled on the following  medications:  . Acetaminophen 500 mg daily prn moderate pain or headache . Vitamin b-12 1000 mcg daily - supplementation . Multiplevitamin daily - supplementation . Vitamin C 1000 mg daily - supplementation  We discussed:  Patient rarely take tylenol. Overall pleased with medication regimen.   Plan  Continue current medications  Vaccines   Reviewed and discussed patient's vaccination history.  Will discuss further vaccines at future visits.   Immunization History  Administered Date(s) Administered  . Moderna SARS-COVID-2 Vaccination 08/03/2019, 08/31/2019    Plan  Recommended patient receive annual flu vaccine in office.   Medication Management   [facility] Uses pill box? Yes Pt endorses excellent compliance  We discussed: Working to avoid doughnut hole in the future and making medications more affordable.   Plan  Continue current medication management strategy    Follow up: 2 week phone visit

## 2020-03-01 NOTE — Patient Instructions (Signed)
Visit Information  Goals Addressed            This Visit's Progress   . Pharmacy Care Plan       CARE PLAN ENTRY (see longitudinal plan of care for additional care plan information)  Current Barriers:  . Chronic Disease Management support, education, and care coordination needs related to Hyperlipidemia, Diabetes, and Heart Failure   Heart Failure BP Readings from Last 3 Encounters:  01/17/20 (!) 116/58  09/13/19 124/80  07/27/19 122/60   . Pharmacist Clinical Goal(s): o Over the next 90 days, patient will work with PharmD and providers to maintain BP goal <130/80 . Current regimen:  o Carvedilol 25 mg twice daily o Entresto 49-51 mg twice daily  . Interventions: o Patient's Entresto arrived via patient assistance (approved through 06/29/2020). Pharmacist will work on Therapist, music for next year once current approval expired.  . Patient self care activities - Over the next 90 days, patient will: o Check BP weekly, document, and provide at future appointments o Ensure daily salt intake < 2300 mg/day  Hyperlipidemia Lab Results  Component Value Date/Time   LDLCALC 107 (H) 01/17/2020 08:26 AM   . Pharmacist Clinical Goal(s): o Over the next 90 days, patient will work with PharmD and providers to achieve LDL goal < 70 . Current regimen:  o Rosuvastatin 10 mg daily . Interventions: o Discussed benefits of statin and encouraged to continue taking statin as prescribed from cardiology.  o Keep up the good work with exercise.  o Incorporate lean meats, vegetables, fruits and whole grains in diet.  . Patient self care activities - Over the next 90 days, patient will: o Contact provider or pharmacist with any questions or concerns.  o Consider trying a statin medication three times weekly.   Diabetes Lab Results  Component Value Date/Time   HGBA1C 7.5 (H) 01/17/2020 08:26 AM   HGBA1C 8.2 (H) 09/13/2019 09:13 AM   . Pharmacist Clinical Goal(s): o Over the next 90  days, patient will work with PharmD and providers to achieve A1c goal <7% . Current regimen:  o Metformin 500 mg 2 tablets each morning and 1 tablet in the evening with meals o Lantus 25 units daily . Interventions: o Reviewed home blood sugar log.  o Recommend increasing metformin dose to 1000 mg every morning and 500 mg every evening with meals.  . Patient self care activities - Over the next 90 days, patient will: o Check blood sugar 3-4 times daily, document, and provide at future appointments o Contact provider with any episodes of hypoglycemia  Medication management . Pharmacist Clinical Goal(s): o Over the next 90 days, patient will work with PharmD and providers to maintain optimal medication adherence . [facility] . Interventions o Comprehensive medication review performed. o Continue current medication management strategy . Patient self care activities - Over the next 90 days, patient will: o Focus on medication adherence by using a pill box o Take medications as prescribed o Report any questions or concerns to PharmD and/or provider(s)  Please see past updates related to this goal by clicking on the "Past Updates" button in the selected goal        The patient verbalized understanding of instructions provided today and declined a print copy of patient instruction materials.   Telephone follow up appointment with pharmacy team member scheduled for: 03/14/2020  Juliane Lack, PharmD, [facility] Clinical Pharmacist [facility] [phone] (office) [phone] (mobile)

## 2020-03-14 ENCOUNTER — Ambulatory Visit: Payer: Medicare Other

## 2020-03-14 DIAGNOSIS — E782 Mixed hyperlipidemia: Secondary | ICD-10-CM

## 2020-03-14 DIAGNOSIS — E1169 Type 2 diabetes mellitus with other specified complication: Secondary | ICD-10-CM

## 2020-03-14 NOTE — Chronic Care Management (AMB) (Signed)
Chronic Care Management Pharmacy  Name: Jeffrey Parsons  MRN: [id number] DOB: 01-03-1951   Chief Complaint/ HPI  Jeffrey Parsons,  69 y.o. , male presents for their Follow-Up CCM visit with the clinical pharmacist via telephone due to COVID-19 Pandemic.  PCP : [name], MD  Their chronic conditions include: CAD, HTN, mild intermittent asthma, essential tremor, parkinson's disease, HLD, DM.   Office Visits: 01/17/2020 - CBC normal, blood sugar 201, kidney and liver tests ok, LDL cholesterol high 107 consider statin, A1c 7.5 09/13/2019 - CBC normal, glucose 201, kidney and liver tests normal, cholesterol normal, A1c 8.2 high need to increase lantus to 30 units at night.  Consult Visit: 02/21/2020 - Neurology - Parkinson's follow-up visit. Please keep track of when you notice the toe curling problem, and how it relates to the dosing of the medicationI will prescribe Carbidopa-Levodopa 25/250 mg tabs. Please take 1 tab four times daily INSTEAD of the 2 tablets of regular (25/100 mg tabs) carbidopa-Levodopa that you are taking now 01/30/2020 - Cardio- Rosuvastatin 10 mg daily prescribed due to LDL 109. Draw lipid and liver panel in 3 months with PCP.  01/11/2020 - remote pacemaker check.  10/12/2019 - remote pacemaker check.  08/04/2019 - Optometry - Diabetes eye exam.  07/27/2019 - Cardiology [place] - continue current meds. Sinus tachycardia ivabradine is an option but not inclined to prescribe due to Parkinson's- max dose of carvedilol already.  07/27/2019 - Cardiology [place]  - BP well controlled. Lipid labs checked at next visit. Non angina Medications: Outpatient Encounter Medications as of 03/14/2020  Medication Sig  . metFORMIN (GLUCOPHAGE) 500 MG tablet Take 2 tablets (1,000 mg total) by mouth 2 (two) times daily with a meal. Start this with new insulin  . acetaminophen (TYLENOL) 500 MG tablet Take 500 mg by mouth daily as needed for moderate pain or  headache.   . albuterol (PROAIR HFA) 108 (90 Base) MCG/ACT inhaler Inhale 2 puffs into the lungs every 6 (six) hours as needed for wheezing or shortness of breath.  [name] aspirin EC 81 MG tablet Take 1 tablet (81 mg total) by mouth daily.  . carbidopa-levodopa (SINEMET CR) 50-200 MG tablet Take 1 tablet by mouth every evening.   . carbidopa-levodopa (SINEMET IR) 25-250 MG tablet Take 1 tablet by mouth 4 (four) times daily.   . carvedilol (COREG) 25 MG tablet Take 1 tablet (25 mg total) by mouth 2 (two) times daily.  . Continuous Blood Gluc Sensor (DEXCOM G6 SENSOR) MISC 1 each by Does not apply route once a week. (Patient not taking: Reported on 02/03/2020)  . Continuous Blood Gluc Sensor (FREESTYLE LIBRE 2 SENSOR) MISC by Does not apply route.  . insulin glargine (LANTUS SOLOSTAR) 100 UNIT/ML Solostar Pen Inject 30 Units into the skin daily. Stop insulin 70/30, keep check in sugars (Patient taking differently: Inject 25 Units into the skin daily. Stop insulin 70/30, keep check in sugars)  . Insulin Pen Needle (PEN NEEDLES 5/16") 30G X 8 MM MISC by Does not apply route.  . Multiple Vitamin (MULTIVITAMIN) tablet Take 1 tablet by mouth daily.  . rosuvastatin (CRESTOR) 10 MG tablet Take 1 tablet (10 mg total) by mouth daily.  . sacubitril-valsartan (ENTRESTO) 49-51 MG Take 1 tablet by mouth 2 (two) times daily.  [name] topiramate (TOPAMAX) 50 MG tablet Take 50 mg by mouth 2 (two) times daily.  . vitamin B-12 (CYANOCOBALAMIN) 1000 MCG tablet Take 1,000 mcg by mouth daily.  . vitamin C (ASCORBIC  ACID) 500 MG tablet Take 1,000 mg by mouth daily.    No facility-administered encounter medications on file as of 03/14/2020.   Allergies  Allergen Reactions  . Onion     Upset stomach  . Sulfa Antibiotics Other (See Comments)    FLUSH    SDOH Screenings   Alcohol Screen:   . Last Alcohol Screening Score (AUDIT): Not on file  Depression (PHQ2-9): Low Risk   . PHQ-2 Score: 2  Financial Resource Strain:   .  Difficulty of Paying Living Expenses: Not on file  Food Insecurity: No Food Insecurity  . Worried About Charity fundraiser in the Last Year: Never true  . Ran Out of Food in the Last Year: Never true  Housing: Low Risk   . Last Housing Risk Score: 0  Physical Activity: Insufficiently Active  . Days of Exercise per Week: 3 days  . Minutes of Exercise per Session: 30 min  Social Connections:   . Frequency of Communication with Friends and Family: Not on file  . Frequency of Social Gatherings with Friends and Family: Not on file  . Attends Religious Services: Not on file  . Active Member of Clubs or Organizations: Not on file  . Attends Archivist Meetings: Not on file  . Marital Status: Not on file  Stress:   . Feeling of Stress : Not on file  Tobacco Use: Low Risk   . Smoking Tobacco Use: Never Smoker  . Smokeless Tobacco Use: Never Used  Transportation Needs: No Transportation Needs  . Lack of Transportation (Medical): No  . Lack of Transportation (Non-Medical): No     Current Diagnosis/Assessment:  Goals Addressed            This Visit's Progress   . Pharmacy Care Plan       CARE PLAN ENTRY (see longitudinal plan of care for additional care plan information)  Current Barriers:  . Chronic Disease Management support, education, and care coordination needs related to Hyperlipidemia, Diabetes, and Heart Failure   Heart Failure BP Readings from Last 3 Encounters:  01/17/20 (!) 116/58  09/13/19 124/80  07/27/19 122/60   . Pharmacist Clinical Goal(s): o Over the next 90 days, patient will work with PharmD and providers to maintain BP goal <130/80 . Current regimen:  o Carvedilol 25 mg twice daily o Entresto 49-51 mg twice daily  . Interventions: o Patient's Entresto arrived via patient assistance (approved through 06/29/2020). Pharmacist will work on Production assistant, radio for next year once current approval expired.  . Patient self care activities - Over  the next 90 days, patient will: o Check BP weekly, document, and provide at future appointments o Ensure daily salt intake < 2300 mg/day  Hyperlipidemia Lab Results  Component Value Date/Time   LDLCALC 107 (H) 01/17/2020 08:26 AM   . Pharmacist Clinical Goal(s): o Over the next 90 days, patient will work with PharmD and providers to achieve LDL goal < 70 . Current regimen:  o Rosuvastatin 10 mg daily . Interventions: o Discussed benefits of statin and encouraged to continue taking statin as prescribed from cardiology.  o Keep up the good work with exercise.  o Incorporate lean meats, vegetables, fruits and whole grains in diet.  o Patient reports feeling bad and increased blood sugars since beginning Crestor. Patient has contacted cardiology to discuss. Recommend trying another statin or reduce frequency of current statin.  . Patient self care activities - Over the next 90 days, patient will: o Contact  provider or pharmacist with any questions or concerns.   Diabetes Lab Results  Component Value Date/Time   HGBA1C 7.5 (H) 01/17/2020 08:26 AM   HGBA1C 8.2 (H) 09/13/2019 09:13 AM   . Pharmacist Clinical Goal(s): o Over the next 90 days, patient will work with PharmD and providers to achieve A1c goal <7% . Current regimen:  o Metformin 1000 mg bid with meals o Lantus 28 units daily . Interventions: o Reviewed home blood sugar log.  o Recommend increasing Lantus to 28 units due to elevated blood sugar results.  . Patient self care activities - Over the next 90 days, patient will: o Check blood sugar 3-4 times daily, document, and provide at future appointments o Contact provider with any episodes of hypoglycemia  Medication management . Pharmacist Clinical Goal(s): o Over the next 90 days, patient will work with PharmD and providers to maintain optimal medication adherence . Current pharmacy: [facility] . Interventions o Comprehensive medication review performed. o Continue  current medication management strategy . Patient self care activities - Over the next 90 days, patient will: o Focus on medication adherence by using a pill box o Take medications as prescribed o Report any questions or concerns to PharmD and/or provider(s)  Please see past updates related to this goal by clicking on the "Past Updates" button in the selected goal        Asthma   Eosinophil count:  No results found for: EOSPCT%                               Eos (Absolute):  Lab Results  Component Value Date/Time   EOSABS 0.3 01/17/2020 08:26 AM    Tobacco Status:  Social History   Tobacco Use  Smoking Status Never Smoker  Smokeless Tobacco Never Used    Patient has failed these meds in past: none reported Patient is currently controlled on the following medications:   albuterol inhaler 2 puffs q6h prn wheezing or shortness of breath Using maintenance inhaler regularly? No Frequency of rescue inhaler use:  infrequently  We discussed:  proper inhaler technique  Plan  Continue current medications ,  Hyperlipidemia   LDL goal < 70  Lipid Panel     Component Value Date/Time   CHOL 166 01/17/2020 0826   TRIG 89 01/17/2020 0826   HDL 42 01/17/2020 0826   LDLCALC 107 (H) 01/17/2020 0826    Hepatic Function Latest Ref Rng & Units 01/17/2020 09/13/2019 12/16/2016  Total Protein 6.0 - 8.5 g/dL 6.8 6.1 6.5  Albumin 3.8 - 4.8 g/dL 4.1 4.1 4.1  AST 0 - 40 IU/L 15 15 20   ALT 0 - 44 IU/L 4 5 14   Alk Phosphatase 48 - 121 IU/L 114 132(H) 128(H)  Total Bilirubin 0.0 - 1.2 mg/dL 0.7 0.7 0.8     The 10-year ASCVD risk score Mikey Bussing DC Jr., et al., 2013) is: 28.4%   Values used to calculate the score:     Age: 49 years     Sex: Male     Is Non-Hispanic African American: No     Diabetic: Yes     Tobacco smoker: No     Systolic Blood Pressure: 161 mmHg     Is BP treated: Yes     HDL Cholesterol: 42 mg/dL     Total Cholesterol: 166 mg/dL   Patient has failed these meds in  past: Atorvastatin  Patient is currently uncontrolled on  the following medications:  . Aspirin ec 81 mg daily . Rosuvastatin 10 mg daily  We discussed:  diet and exercise extensively. Patient has been recommended to begin a statin for cholesterol. Patient's ASCVD risk is elevated and his LDL is above goal. Patient has tried/failed atorvastatin in the past. Pharmacist will encourage statin on follow-up visit.   Update 02/03/2020 - Cardiology began rosuvastatin 10 mg daily. Pharmacist encouraged patient of the benefits of taking statin. Will review lipid, liver and BMP in 3 months. Patient continues to exercise 4 times weekly.   Update 03/01/2020 - Patient tolerating rosuvastatin well.   Update 03/14/2020 - Patient feels that Crestor makes him feel bad and increasing blood sugar. He isn't resting well. Increased blood sugar as well. Patient has discontinued this medication on his own. He had 5 bypasses 20 years ago. Patient sent a message to Cardiology to discuss elevated blood sugar and not feeling well. Waiting for a response.   Plan  Continue current medications.    Diabetes   Recent Relevant Labs: Lab Results  Component Value Date/Time   HGBA1C 7.5 (H) 01/17/2020 08:26 AM   HGBA1C 8.2 (H) 09/13/2019 09:13 AM   MICROALBUR 80 01/17/2020 08:38 AM     Checking BG: 3x per Day  Recent FBG Readings:64-280 Recent pre-meal BG readings: 95-420 Recent 2hr PP BG readings:  125-363 Patient has failed these meds in past: Novolog, novolin 70/30,  Patient is currently uncontrolled on the following medications:   Lantus solostar 30 units daily  Metformin 500 mg bid with a meal  insuln pen needles   Last diabetic Foot exam: No results found for: HMDIABEYEEXA  Last diabetic Eye exam: No results found for: HMDIABFOOTEX   We discussed: diet and exercise extensively. Patient has had some recent fluctuations in blood sugar. Patient can explain some recent high blood sugars with things like  tortilla chips, chicken pot pie, biscuit for breakfast and McDonald's. Patient exercises at the gym 3-5 times each week. He carries a juicy juice box with him now in the event of low blood sugar. Low blood sugars are improved from previous regimen. Patient's wife is working to help with the diet piece based on education from [employer]. Provided patient with a sample Dexcom CGM in office due to dexterity.   Update 02/03/2020 - Insurance denied Dexcom due to not injecting four times daily. Patient currently using sample of Libre 2 at home. Pharmacist contacted [employer] for benefit verification of Libre 2. Patient's insurance covers [place] 2. Patient began Lantus 30 units daily and Metformin 500 mg bid with a meal. He had some overnight blood sugars in the 50s and fasting readings that were below goal. He was symptomatic jittery/shaking. Pharmacist contacted Dr. [name] and Colvin Caroli with udpate. Lantus was reduced to 25 units daily 08/04. Since reduction patient's blood sugars are improved. Fasting 08/06 93 and patient feels fine. Did not dip low overnight. Patient's blood sugar 08/05 - am 66/ lunch 129/supper 135/bedtime 178.   Discussed next month we will consider increasing metformin dose if tolerating well and further reducing Lantus to ensure reduced risk of low blood sugar. Patient is correcting appropriately with 1/2 cup of juice during low blood sugar events. Patient takes juices with him to the gym to ensure that his blood sugar doesn't become dangerously low. Exercise seems to reduce blood sugar by at least 20 mg/dL.   Update 03/01/2020 - Patient's blood sugars vary from day to day.   Recent trends:  Breakfast: 59,  67, 68, 184, 245, 127, 185  Lunch: 78, 182, 104, 148, 245, 110, 269, 205  Dinner: 145, 244, 185, 229, 310, 150, 227, 205  Bedtime: 167, 148, 198, 350, 294, 197, 250, 199  Patient working on diet and exercising 5 days each week. Discussed increasing metformin. Recommend  increasing metformin 500 mg- 2 tablets in the morning and 1 tablet in the evening. Patient is willing per Dr. Blanch Media approval. [facility] Patient will monitor blood sugars and report any episode of low to pharmacist. Patient scheduled for follow-up appointment in 2 weeks.   Update 03/14/2020 -Recommend increase to Lantus to 28 units daily in the evening 03/12/2020. Patient reports low appetite and general not feeling well. Denies significant GI effects since increasing metformin.   Recent trends:   Breakfast 145-332  Lunch: [place]  Dinner: 235-369  Bedtime 245-366.   Plan  Recommend continue Metformin 1000 mg bid. Increase Lantus to 28 units every  evening. Folllow-up with pharmacist in 1 week to assess.  Heart Failure   Type: Combined Systolic and Diastolic  Last ejection fraction: 20-25% NYHA Class: III (marked limitation of activity)  BP today is:  <130/80  Office blood pressures are  BP Readings from Last 3 Encounters:  01/30/20 (!) 114/52  01/17/20 (!) 116/58  09/13/19 124/80    Patient has failed these meds in the past: furosemide, hydrochlorothiazide, losartan, losartan-hydrochlorothiazide, metoprolol Patient is currently controlled on the following medications:   Carvedilol 25 mg bid  Entresto 49-51 mg bid  Patient checks BP at home weekly  Patient home BP readings are ranging: at or below goal  We discussed diet and exercise extensively. Patient encouraged to consume diet of lean protein, vegetables, fruits in moderation and whole grains. Patient is in the coverage gap and Delene Loll is quite expensive. Pharmacist working with patient assistance to obtain medication for patient.   02/03/2020 - Patient received Entresto through patient assistance. Approved through 06/29/2020. Pharmacist will help coordinate renewal at the end of the year.   Plan  Continue current medications   Parkinson's Disease   Patient has  failed these meds in past: n/a Patient is currently uncontrolled on the following medications:  . Carbidopa-levodopa CR 50-200 mg every evening . Carbidopa-levodopa IR 25-100 2 tablets QID . Topiramate 50 mg bid  We discussed:  Patient is hoping there is something the neurologist can do at next visit to improve symptoms. He is having difficulty checking his blood sugar and completing some tasks due to tremor. Wife states that they can tell an improvement since he has been able to resume exercise in the gym post Covid. Days that symptoms are worse they try to get out and ride or walk.   Plan  Continue current medications and follow-up with neurologist   Health Maintenance   Patient is currently controlled on the following medications:  . Acetaminophen 500 mg daily prn moderate pain or headache . Vitamin b-12 1000 mcg daily - supplementation . Multiplevitamin daily - supplementation . Vitamin C 1000 mg daily - supplementation  We discussed:  Patient rarely take tylenol. Overall pleased with medication regimen.   Plan  Continue current medications  Vaccines   Reviewed and discussed patient's vaccination history.  Will discuss further vaccines at future visits.   Immunization History  Administered Date(s) Administered  . Moderna SARS-COVID-2 Vaccination 08/03/2019, 08/31/2019    Plan  Recommended patient receive annual flu vaccine in office.   Medication Management   [facility] Uses pill box? Yes Pt endorses excellent compliance  We discussed: Working to avoid doughnut hole in the future and making medications more affordable.   Plan  Continue current medication management strategy    Follow up: 1 week phone visit

## 2020-03-14 NOTE — Patient Instructions (Signed)
Visit Information  Goals Addressed            This Visit's Progress   . Pharmacy Care Plan       CARE PLAN ENTRY (see longitudinal plan of care for additional care plan information)  Current Barriers:  . Chronic Disease Management support, education, and care coordination needs related to Hyperlipidemia, Diabetes, and Heart Failure   Heart Failure BP Readings from Last 3 Encounters:  01/17/20 (!) 116/58  09/13/19 124/80  07/27/19 122/60   . Pharmacist Clinical Goal(s): o Over the next 90 days, patient will work with PharmD and providers to maintain BP goal <130/80 . Current regimen:  o Carvedilol 25 mg twice daily o Entresto 49-51 mg twice daily  . Interventions: o Patient's Entresto arrived via patient assistance (approved through 06/29/2020). Pharmacist will work on Therapist, music for next year once current approval expired.  . Patient self care activities - Over the next 90 days, patient will: o Check BP weekly, document, and provide at future appointments o Ensure daily salt intake < 2300 mg/day  Hyperlipidemia Lab Results  Component Value Date/Time   LDLCALC 107 (H) 01/17/2020 08:26 AM   . Pharmacist Clinical Goal(s): o Over the next 90 days, patient will work with PharmD and providers to achieve LDL goal < 70 . Current regimen:  o Rosuvastatin 10 mg daily . Interventions: o Discussed benefits of statin and encouraged to continue taking statin as prescribed from cardiology.  o Keep up the good work with exercise.  o Incorporate lean meats, vegetables, fruits and whole grains in diet.  o Patient reports feeling bad and increased blood sugars since beginning Crestor. Patient has contacted cardiology to discuss. Recommend trying another statin or reduce frequency of current statin.  . Patient self care activities - Over the next 90 days, patient will: o Contact provider or pharmacist with any questions or concerns.   Diabetes Lab Results  Component Value  Date/Time   HGBA1C 7.5 (H) 01/17/2020 08:26 AM   HGBA1C 8.2 (H) 09/13/2019 09:13 AM   . Pharmacist Clinical Goal(s): o Over the next 90 days, patient will work with PharmD and providers to achieve A1c goal <7% . Current regimen:  o Metformin 1000 mg bid with meals o Lantus 28 units daily . Interventions: o Reviewed home blood sugar log.  o Recommend increasing Lantus to 28 units due to elevated blood sugar results.  . Patient self care activities - Over the next 90 days, patient will: o Check blood sugar 3-4 times daily, document, and provide at future appointments o Contact provider with any episodes of hypoglycemia  Medication management . Pharmacist Clinical Goal(s): o Over the next 90 days, patient will work with PharmD and providers to maintain optimal medication adherence . [facility] . Interventions o Comprehensive medication review performed. o Continue current medication management strategy . Patient self care activities - Over the next 90 days, patient will: o Focus on medication adherence by using a pill box o Take medications as prescribed o Report any questions or concerns to PharmD and/or provider(s)  Please see past updates related to this goal by clicking on the "Past Updates" button in the selected goal        The patient verbalized understanding of instructions provided today and declined a print copy of patient instruction materials.   Telephone follow up appointment with pharmacy team member scheduled for: 03/20/2020 @ 2:30 pm to review blood sugar log  Juliane Lack, PharmD, [facility] Clinical  Pharmacist [facility] [phone] (office) [phone] (mobile)

## 2020-03-19 NOTE — Chronic Care Management (AMB) (Signed)
Chronic Care Management Pharmacy  Name: Jeffrey Parsons  MRN: [id number] DOB: 01/04/1951   Chief Complaint/ HPI  Jeffrey Parsons,  69 y.o. , male presents for their Follow-Up CCM visit with the clinical pharmacist via telephone due to COVID-19 Pandemic.  PCP : [name], MD  Their chronic conditions include: CAD, HTN, mild intermittent asthma, essential tremor, parkinson's disease, HLD, DM.   Office Visits: 01/17/2020 - CBC normal, blood sugar 201, kidney and liver tests ok, LDL cholesterol high 107 consider statin, A1c 7.5 09/13/2019 - CBC normal, glucose 201, kidney and liver tests normal, cholesterol normal, A1c 8.2 high need to increase lantus to 30 units at night.  Consult Visit: 02/21/2020 - Neurology - Parkinson's follow-up visit. Please keep track of when you notice the toe curling problem, and how it relates to the dosing of the medicationI will prescribe Carbidopa-Levodopa 25/250 mg tabs. Please take 1 tab four times daily INSTEAD of the 2 tablets of regular (25/100 mg tabs) carbidopa-Levodopa that you are taking now 01/30/2020 - Cardio- Rosuvastatin 10 mg daily prescribed due to LDL 109. Draw lipid and liver panel in 3 months with PCP.  01/11/2020 - remote pacemaker check.  10/12/2019 - remote pacemaker check.  08/04/2019 - Optometry - Diabetes eye exam.  07/27/2019 - Cardiology [facility] - continue current meds. Sinus tachycardia ivabradine is an option but not inclined to prescribe due to Parkinson's- max dose of carvedilol already.  07/27/2019 - Cardiology [facility]  - BP well controlled. Lipid labs checked at next visit. Non angina Medications: Outpatient Encounter Medications as of 03/20/2020  Medication Sig  . acetaminophen (TYLENOL) 500 MG tablet Take 500 mg by mouth daily as needed for moderate pain or headache.   . albuterol (PROAIR HFA) 108 (90 Base) MCG/ACT inhaler Inhale 2 puffs into the lungs every 6 (six) hours as needed for wheezing or  shortness of breath.  [name] aspirin EC 81 MG tablet Take 1 tablet (81 mg total) by mouth daily.  . carbidopa-levodopa (SINEMET CR) 50-200 MG tablet Take 1 tablet by mouth every evening.   . carbidopa-levodopa (SINEMET IR) 25-250 MG tablet Take 1 tablet by mouth 4 (four) times daily.   . carvedilol (COREG) 25 MG tablet Take 1 tablet (25 mg total) by mouth 2 (two) times daily.  . Continuous Blood Gluc Sensor (DEXCOM G6 SENSOR) MISC 1 each by Does not apply route once a week. (Patient not taking: Reported on 02/03/2020)  . Continuous Blood Gluc Sensor (FREESTYLE LIBRE 2 SENSOR) MISC by Does not apply route.  . insulin glargine (LANTUS SOLOSTAR) 100 UNIT/ML Solostar Pen Inject 30 Units into the skin daily. Stop insulin 70/30, keep check in sugars (Patient taking differently: Inject 25 Units into the skin daily. Stop insulin 70/30, keep check in sugars)  . Insulin Pen Needle (PEN NEEDLES 5/16") 30G X 8 MM MISC by Does not apply route.  . metFORMIN (GLUCOPHAGE) 500 MG tablet Take 2 tablets (1,000 mg total) by mouth 2 (two) times daily with a meal. Start this with new insulin  . Multiple Vitamin (MULTIVITAMIN) tablet Take 1 tablet by mouth daily.  . rosuvastatin (CRESTOR) 10 MG tablet Take 1 tablet (10 mg total) by mouth daily.  . sacubitril-valsartan (ENTRESTO) 49-51 MG Take 1 tablet by mouth 2 (two) times daily.  [name] topiramate (TOPAMAX) 50 MG tablet Take 50 mg by mouth 2 (two) times daily.  . vitamin B-12 (CYANOCOBALAMIN) 1000 MCG tablet Take 1,000 mcg by mouth daily.  . vitamin C (ASCORBIC  ACID) 500 MG tablet Take 1,000 mg by mouth daily.    No facility-administered encounter medications on file as of 03/20/2020.   Allergies  Allergen Reactions  . Onion     Upset stomach  . Sulfa Antibiotics Other (See Comments)    FLUSH    SDOH Screenings   Alcohol Screen:   . Last Alcohol Screening Score (AUDIT): Not on file  Depression (PHQ2-9): Low Risk   . PHQ-2 Score: 2  Financial Resource Strain:   .  Difficulty of Paying Living Expenses: Not on file  Food Insecurity: No Food Insecurity  . Worried About Charity fundraiser in the Last Year: Never true  . Ran Out of Food in the Last Year: Never true  Housing: Low Risk   . Last Housing Risk Score: 0  Physical Activity: Insufficiently Active  . Days of Exercise per Week: 3 days  . Minutes of Exercise per Session: 30 min  Social Connections:   . Frequency of Communication with Friends and Family: Not on file  . Frequency of Social Gatherings with Friends and Family: Not on file  . Attends Religious Services: Not on file  . Active Member of Clubs or Organizations: Not on file  . Attends Archivist Meetings: Not on file  . Marital Status: Not on file  Stress:   . Feeling of Stress : Not on file  Tobacco Use: Low Risk   . Smoking Tobacco Use: Never Smoker  . Smokeless Tobacco Use: Never Used  Transportation Needs: No Transportation Needs  . Lack of Transportation (Medical): No  . Lack of Transportation (Non-Medical): No     Current Diagnosis/Assessment:  Goals Addressed            This Visit's Progress   . Pharmacy Care Plan       CARE PLAN ENTRY (see longitudinal plan of care for additional care plan information)  Current Barriers:  . Chronic Disease Management support, education, and care coordination needs related to Hyperlipidemia, Diabetes, and Heart Failure   Heart Failure BP Readings from Last 3 Encounters:  01/17/20 (!) 116/58  09/13/19 124/80  07/27/19 122/60   . Pharmacist Clinical Goal(s): o Over the next 90 days, patient will work with PharmD and providers to maintain BP goal <130/80 . Current regimen:  o Carvedilol 25 mg twice daily o Entresto 49-51 mg twice daily  . Interventions: o Patient's Entresto arrived via patient assistance (approved through 06/29/2020). Pharmacist will work on Production assistant, radio for next year once current approval expired.  . Patient self care activities - Over  the next 90 days, patient will: o Check BP weekly, document, and provide at future appointments o Ensure daily salt intake < 2300 mg/day  Hyperlipidemia Lab Results  Component Value Date/Time   LDLCALC 107 (H) 01/17/2020 08:26 AM   . Pharmacist Clinical Goal(s): o Over the next 90 days, patient will work with PharmD and providers to achieve LDL goal < 70 . Current regimen:  o Aspirin ec 81 mg daily . Interventions: o Discussed benefits of statin and encouraged to continue taking statin as prescribed from cardiology.  o Keep up the good work with exercise.  o Incorporate lean meats, vegetables, fruits and whole grains in diet.  o Patient reports feeling bad and increased blood sugars since beginning Crestor. Patient has contacted cardiology to discuss. Recommend trying another statin or reduce frequency of current statin.  . Patient self care activities - Over the next 90 days, patient will: o  Contact provider or pharmacist with any questions or concerns.   Diabetes Lab Results  Component Value Date/Time   HGBA1C 7.5 (H) 01/17/2020 08:26 AM   HGBA1C 8.2 (H) 09/13/2019 09:13 AM   . Pharmacist Clinical Goal(s): o Over the next 90 days, patient will work with PharmD and providers to achieve A1c goal <7% . Current regimen:  o Metformin 1000 mg bid with meals o Lantus 23 units daily . Interventions: o Reviewed home blood sugar log.  o Recommend decrease Lantus 23 units daily per recent blood sugar results.  . Patient self care activities - Over the next 90 days, patient will: o Check blood sugar 3-4 times daily, document, and provide at future appointments o Contact provider with any episodes of hypoglycemia  Medication management . Pharmacist Clinical Goal(s): o Over the next 90 days, patient will work with PharmD and providers to maintain optimal medication adherence . Current pharmacy: [facility] . Interventions o Comprehensive medication review performed. o Continue  current medication management strategy . Patient self care activities - Over the next 90 days, patient will: o Focus on medication adherence by using a pill box o Take medications as prescribed o Report any questions or concerns to PharmD and/or provider(s)  Please see past updates related to this goal by clicking on the "Past Updates" button in the selected goal        Asthma   Eosinophil count:  No results found for: EOSPCT%                               Eos (Absolute):  Lab Results  Component Value Date/Time   EOSABS 0.3 01/17/2020 08:26 AM    Tobacco Status:  Social History   Tobacco Use  Smoking Status Never Smoker  Smokeless Tobacco Never Used    Patient has failed these meds in past: none reported Patient is currently controlled on the following medications:   albuterol inhaler 2 puffs q6h prn wheezing or shortness of breath Using maintenance inhaler regularly? No Frequency of rescue inhaler use:  infrequently  We discussed:  proper inhaler technique  Plan  Continue current medications ,  Hyperlipidemia   LDL goal < 70  Lipid Panel     Component Value Date/Time   CHOL 166 01/17/2020 0826   TRIG 89 01/17/2020 0826   HDL 42 01/17/2020 0826   LDLCALC 107 (H) 01/17/2020 0826    Hepatic Function Latest Ref Rng & Units 01/17/2020 09/13/2019 12/16/2016  Total Protein 6.0 - 8.5 g/dL 6.8 6.1 6.5  Albumin 3.8 - 4.8 g/dL 4.1 4.1 4.1  AST 0 - 40 IU/L 15 15 20   ALT 0 - 44 IU/L 4 5 14   Alk Phosphatase 48 - 121 IU/L 114 132(H) 128(H)  Total Bilirubin 0.0 - 1.2 mg/dL 0.7 0.7 0.8     The 10-year ASCVD risk score Mikey Bussing DC Jr., et al., 2013) is: 28.4%   Values used to calculate the score:     Age: 58 years     Sex: Male     Is Non-Hispanic African American: No     Diabetic: Yes     Tobacco smoker: No     Systolic Blood Pressure: 294 mmHg     Is BP treated: Yes     HDL Cholesterol: 42 mg/dL     Total Cholesterol: 166 mg/dL   Patient has failed these meds in  past: Atorvastatin, rosuvastatin Patient is currently uncontrolled on  the following medications:  . Aspirin ec 81 mg daily  We discussed:  diet and exercise extensively. Patient has been recommended to begin a statin for cholesterol. Patient's ASCVD risk is elevated and his LDL is above goal. Patient has tried/failed atorvastatin in the past. Pharmacist will encourage statin on follow-up visit.   Update 02/03/2020 - Cardiology began rosuvastatin 10 mg daily. Pharmacist encouraged patient of the benefits of taking statin. Will review lipid, liver and BMP in 3 months. Patient continues to exercise 4 times weekly.   Update 03/01/2020 - Patient tolerating rosuvastatin well.   Update 03/14/2020 - Patient feels that Crestor makes him feel bad and increasing blood sugar. He isn't resting well. Increased blood sugar as well. Patient has discontinued this medication on his own. He had 5 bypasses 20 years ago. Patient sent a message to Cardiology to discuss elevated blood sugar and not feeling well. Waiting for a response.   Update 03/20/2020 - Patient stopped Crestor on his own last week. He is not interested in changing to pravastatin. Encouraged patient to follow-up with cardiology to determine if non-statin option is appropriate. Counseled patient on the benefits of statin therapy and risk reduction associated. Patient is aware but has not tolerated Lipitor in the past and does not want to resume a statin at this point. Patient plans to follow-up with cardiology.   Plan  Follow-up with cardiology to determine next steps after inability to tolerate 2 different statins.     Diabetes   Recent Relevant Labs: Lab Results  Component Value Date/Time   HGBA1C 7.5 (H) 01/17/2020 08:26 AM   HGBA1C 8.2 (H) 09/13/2019 09:13 AM   MICROALBUR 80 01/17/2020 08:38 AM     Checking BG: 3x per Day  Recent FBG Readings:64-280 Recent pre-meal BG readings: 95-420 Recent 2hr PP BG readings:  125-363 Patient has  failed these meds in past: Novolog, novolin 70/30,  Patient is currently uncontrolled on the following medications:   Lantus solostar 30 units daily  Metformin 500 mg bid with a meal  insuln pen needles   Last diabetic Foot exam: No results found for: HMDIABEYEEXA  Last diabetic Eye exam: No results found for: HMDIABFOOTEX   We discussed: diet and exercise extensively. Patient has had some recent fluctuations in blood sugar. Patient can explain some recent high blood sugars with things like tortilla chips, chicken pot pie, biscuit for breakfast and McDonald[employer]. Patient exercises at the gym 3-5 times each week. He carries a juicy juice box with him now in the event of low blood sugar. Low blood sugars are improved from previous regimen. Patient's wife is working to help with the diet piece based on education from [place]. Provided patient with a sample Dexcom CGM in office due to dexterity.   Update 02/03/2020 - Insurance denied Dexcom due to not injecting four times daily. Patient currently using sample of Libre 2 at home. Pharmacist contacted [employer] for benefit verification of Libre 2. Patient's insurance covers [place] 2. Patient began Lantus 30 units daily and Metformin 500 mg bid with a meal. He had some overnight blood sugars in the 50s and fasting readings that were below goal. He was symptomatic jittery/shaking. Pharmacist contacted Dr. [name] and Colvin Caroli with udpate. Lantus was reduced to 25 units daily 08/04. Since reduction patient's blood sugars are improved. Fasting 08/06 93 and patient feels fine. Did not dip low overnight. Patient's blood sugar 08/05 - am 66/ lunch 129/supper 135/bedtime 178.   Discussed next month we will consider  increasing metformin dose if tolerating well and further reducing Lantus to ensure reduced risk of low blood sugar. Patient is correcting appropriately with 1/2 cup of juice during low blood sugar events. Patient takes juices with him to the gym to  ensure that his blood sugar doesn't become dangerously low. Exercise seems to reduce blood sugar by at least 20 mg/dL.   Update 03/01/2020 - Patient's blood sugars vary from day to day.   Recent trends:  Breakfast: 59, 67, 68, 184, 245, 127, 185  Lunch: 78, 182, 104, 148, 245, 110, 269, 205  Dinner: 145, 244, 185, 229, 310, 150, 227, 205  Bedtime: 167, 148, 198, 350, 294, 197, 250, 199  Patient working on diet and exercising 5 days each week. Discussed increasing metformin. Recommend increasing metformin 500 mg- 2 tablets in the morning and 1 tablet in the evening. Patient is willing per Dr. Blanch Media approval. [facility] Patient will monitor blood sugars and report any episode of low to pharmacist. Patient scheduled for follow-up appointment in 2 weeks.   Update 03/14/2020 -Recommend increase to Lantus to 28 units daily in the evening 03/12/2020. Patient reports low appetite and general not feeling well. Denies significant GI effects since increasing metformin.   Recent trends:   Breakfast 145-332  Lunch: [place]  Dinner: 235-369  Bedtime 245-366.   Update 03/20/2020 -  Blood sugar readings breakfast/lunch/supper/bedtime 03/14/2020:  145/164/182/223 03/15/2020:  109/147/143/158 03/16/2020:  67 (overnight)/ 142/174/190/205 03/17/2020:  81/174/158/123 03/18/2020: 55 (overnight)/ 82/190/267/269 - church lunch with mainly carbohydrates for meal 03/19/2020:  78/126/135/166 03/20/2020:  90/164  Patient reports blood sugars improved very shortly after stopping Crestor. Patient is feeling better overall. He reduced insulin dose to 25 units 03/15/2020 after sugars began improving after discontinuing Crestor. Patient is having low blood sugars the past 4 days on 25 units of Lantus. We discussed goals of therapy and patient preferences. Recommend reducing Lantus to 23 units each evening and continue Metformin 1000 mg.   Plan  Recommend continue  Metformin 1000 mg bid. Decrease Lantus to 23 units every  evening. Folllow-up with pharmacist in 1 week to assess.  Heart Failure   Type: Combined Systolic and Diastolic  Last ejection fraction: 20-25% NYHA Class: III (marked limitation of activity)  BP today is:  <130/80  Office blood pressures are  BP Readings from Last 3 Encounters:  01/30/20 (!) 114/52  01/17/20 (!) 116/58  09/13/19 124/80    Patient has failed these meds in the past: furosemide, hydrochlorothiazide, losartan, losartan-hydrochlorothiazide, metoprolol Patient is currently controlled on the following medications:   Carvedilol 25 mg bid  Entresto 49-51 mg bid  Patient checks BP at home weekly  Patient home BP readings are ranging: at or below goal  We discussed diet and exercise extensively. Patient encouraged to consume diet of lean protein, vegetables, fruits in moderation and whole grains. Patient is in the coverage gap and Delene Loll is quite expensive. Pharmacist working with patient assistance to obtain medication for patient.   02/03/2020 - Patient received Entresto through patient assistance. Approved through 06/29/2020. Pharmacist will help coordinate renewal at the end of the year.   Plan  Continue current medications   Parkinson's Disease   Patient has failed these meds in past: n/a Patient is currently uncontrolled on the following medications:  . Carbidopa-levodopa CR 50-200 mg every evening . Carbidopa-levodopa IR 25-100 2 tablets QID . Topiramate 50 mg bid  We discussed:  Patient is hoping there is something  the neurologist can do at next visit to improve symptoms. He is having difficulty checking his blood sugar and completing some tasks due to tremor. Wife states that they can tell an improvement since he has been able to resume exercise in the gym post Covid. Days that symptoms are worse they try to get out and ride or walk.   Plan  Continue current medications and follow-up with  neurologist   Health Maintenance   Patient is currently controlled on the following medications:  . Acetaminophen 500 mg daily prn moderate pain or headache . Vitamin b-12 1000 mcg daily - supplementation . Multiplevitamin daily - supplementation . Vitamin C 1000 mg daily - supplementation  We discussed:  Patient rarely take tylenol. Overall pleased with medication regimen.   Plan  Continue current medications  Vaccines   Reviewed and discussed patient's vaccination history.  Will discuss further vaccines at future visits.   Immunization History  Administered Date(s) Administered  . Moderna SARS-COVID-2 Vaccination 08/03/2019, 08/31/2019    Plan  Recommended patient receive annual flu vaccine in office.   Medication Management   [facility] Uses pill box? Yes Pt endorses excellent compliance  We discussed: Working to avoid doughnut hole in the future and making medications more affordable.   Plan  Continue current medication management strategy    Follow up: 1 week phone visit

## 2020-03-20 ENCOUNTER — Other Ambulatory Visit: Payer: Self-pay

## 2020-03-20 ENCOUNTER — Ambulatory Visit: Payer: Medicare Other

## 2020-03-20 DIAGNOSIS — E1169 Type 2 diabetes mellitus with other specified complication: Secondary | ICD-10-CM

## 2020-03-20 DIAGNOSIS — E782 Mixed hyperlipidemia: Secondary | ICD-10-CM

## 2020-03-26 NOTE — Chronic Care Management (AMB) (Signed)
Chronic Care Management Pharmacy  Name: Jeffrey Parsons  MRN: [id number] DOB: [birth year]   Chief Complaint/ HPI  Jeffrey Parsons,  69 y.o. , male presents for their Follow-Up CCM visit with the clinical pharmacist via telephone due to COVID-19 Pandemic.  PCP : [name], MD  Their chronic conditions include: CAD, HTN, mild intermittent asthma, essential tremor, parkinson's disease, HLD, DM.   Office Visits: 01/17/2020 - CBC normal, blood sugar 201, kidney and liver tests ok, LDL cholesterol high 107 consider statin, A1c 7.5 09/13/2019 - CBC normal, glucose 201, kidney and liver tests normal, cholesterol normal, A1c 8.2 high need to increase lantus to 30 units at night.  Consult Visit: 02/21/2020 - Neurology - Parkinson's follow-up visit. Please keep track of when you notice the toe curling problem, and how it relates to the dosing of the medicationI will prescribe Carbidopa-Levodopa 25/250 mg tabs. Please take 1 tab four times daily INSTEAD of the 2 tablets of regular (25/100 mg tabs) carbidopa-Levodopa that you are taking now 01/30/2020 - Cardio- Rosuvastatin 10 mg daily prescribed due to LDL 109. Draw lipid and liver panel in 3 months with PCP.  01/11/2020 - remote pacemaker check.  10/12/2019 - remote pacemaker check.  08/04/2019 - Optometry - Diabetes eye exam.  07/27/2019 - Cardiology [facility] - continue current meds. Sinus tachycardia ivabradine is an option but not inclined to prescribe due to Parkinson's- max dose of carvedilol already.  07/27/2019 - Cardiology [facility]  - BP well controlled. Lipid labs checked at next visit. Non angina Medications: Outpatient Encounter Medications as of 03/27/2020  Medication Sig  . metFORMIN (GLUCOPHAGE) 500 MG tablet Take 2 tablets (1,000 mg total) by mouth 2 (two) times daily with a meal. Start this with new insulin  . acetaminophen (TYLENOL) 500 MG tablet Take 500 mg by mouth daily as needed for moderate pain or  headache.   . albuterol (PROAIR HFA) 108 (90 Base) MCG/ACT inhaler Inhale 2 puffs into the lungs every 6 (six) hours as needed for wheezing or shortness of breath.  [name] aspirin EC 81 MG tablet Take 1 tablet (81 mg total) by mouth daily.  . carbidopa-levodopa (SINEMET CR) 50-200 MG tablet Take 1 tablet by mouth every evening.   . carbidopa-levodopa (SINEMET IR) 25-250 MG tablet Take 1 tablet by mouth 4 (four) times daily.   . carvedilol (COREG) 25 MG tablet Take 1 tablet (25 mg total) by mouth 2 (two) times daily.  . Continuous Blood Gluc Sensor (DEXCOM G6 SENSOR) MISC 1 each by Does not apply route once a week. (Patient not taking: Reported on 02/03/2020)  . Continuous Blood Gluc Sensor (FREESTYLE LIBRE 2 SENSOR) MISC by Does not apply route.  . insulin glargine (LANTUS SOLOSTAR) 100 UNIT/ML Solostar Pen Inject 30 Units into the skin daily. Stop insulin 70/30, keep check in sugars (Patient taking differently: Inject 22 Units into the skin in the morning. Stop insulin 70/30, keep check in sugars)  . Insulin Pen Needle (PEN NEEDLES 5/16") 30G X 8 MM MISC by Does not apply route.  . Multiple Vitamin (MULTIVITAMIN) tablet Take 1 tablet by mouth daily.  . rosuvastatin (CRESTOR) 10 MG tablet Take 1 tablet (10 mg total) by mouth daily.  . sacubitril-valsartan (ENTRESTO) 49-51 MG Take 1 tablet by mouth 2 (two) times daily.  [name] topiramate (TOPAMAX) 50 MG tablet Take 50 mg by mouth 2 (two) times daily.  . vitamin B-12 (CYANOCOBALAMIN) 1000 MCG tablet Take 1,000 mcg by mouth daily.  . vitamin  C (ASCORBIC ACID) 500 MG tablet Take 1,000 mg by mouth daily.    No facility-administered encounter medications on file as of 03/27/2020.   Allergies  Allergen Reactions  . Onion     Upset stomach  . Sulfa Antibiotics Other (See Comments)    FLUSH    SDOH Screenings   Alcohol Screen:   . Last Alcohol Screening Score (AUDIT): Not on file  Depression (PHQ2-9): Low Risk   . PHQ-2 Score: 2  Financial Resource  Strain:   . Difficulty of Paying Living Expenses: Not on file  Food Insecurity: No Food Insecurity  . Worried About Running Out of Food in the Last Year: Never true  . Ran Out of Food in the Last Year: Never true  Housing: Low Risk   . Last Housing Risk Score: 0  Physical Activity: Insufficiently Active  . Days of Exercise per Week: 3 days  . Minutes of Exercise per Session: 30 min  Social Connections:   . Frequency of Communication with Friends and Family: Not on file  . Frequency of Social Gatherings with Friends and Family: Not on file  . Attends Religious Services: Not on file  . Active Member of Clubs or Organizations: Not on file  . Attends Club or Organization Meetings: Not on file  . Marital Status: Not on file  Stress:   . Feeling of Stress : Not on file  Tobacco Use: Low Risk   . Smoking Tobacco Use: Never Smoker  . Smokeless Tobacco Use: Never Used  Transportation Needs: No Transportation Needs  . Lack of Transportation (Medical): No  . Lack of Transportation (Non-Medical): No     Current Diagnosis/Assessment:  Goals Addressed            This Visit's Progress   . Pharmacy Care Plan       CARE PLAN ENTRY (see longitudinal plan of care for additional care plan information)  Current Barriers:  . Chronic Disease Management support, education, and care coordination needs related to Hyperlipidemia, Diabetes, and Heart Failure   Heart Failure BP Readings from Last 3 Encounters:  01/17/20 (!) 116/58  09/13/19 124/80  07/27/19 122/60   . Pharmacist Clinical Goal(s): o Over the next 90 days, patient will work with PharmD and providers to maintain BP goal <130/80 . Current regimen:  o Carvedilol 25 mg twice daily o Entresto 49-51 mg twice daily  . Interventions: o Patient's Entresto arrived via patient assistance (approved through 06/29/2020). Pharmacist will work on renewing application for next year once current approval expired.  . Patient self care  activities - Over the next 90 days, patient will: o Check BP weekly, document, and provide at future appointments o Ensure daily salt intake < 2300 mg/day  Hyperlipidemia Lab Results  Component Value Date/Time   LDLCALC 107 (H) 01/17/2020 08:26 AM   . Pharmacist Clinical Goal(s): o Over the next 90 days, patient will work with PharmD and providers to achieve LDL goal < 70 . Current regimen:  o Aspirin ec 81 mg daily . Interventions: o Discussed benefits of statin and encouraged to continue taking statin as prescribed from cardiology.  o Keep up the good work with exercise.  o Incorporate lean meats, vegetables, fruits and whole grains in diet.  o Patient reports feeling bad and increased blood sugars since beginning Crestor. Patient has contacted cardiology to discuss. Recommend trying another statin or reduce frequency of current statin.  . Patient self care activities - Over the next 90 days, patient   will: o Contact provider or pharmacist with any questions or concerns.   Diabetes Lab Results  Component Value Date/Time   HGBA1C 7.5 (H) 01/17/2020 08:26 AM   HGBA1C 8.2 (H) 09/13/2019 09:13 AM   . Pharmacist Clinical Goal(s): o Over the next 90 days, patient will work with PharmD and providers to achieve A1c goal <7% . Current regimen:  o Metformin 1000 mg bid with meals o Lantus 22 units daily in the morning.  . Interventions: o Reviewed home blood sugar log.  o Recommend decrease Lantus 22 units daily per recent blood sugar results.  . Patient self care activities - Over the next 90 days, patient will: o Check blood sugar 3-4 times daily, document, and provide at future appointments o Contact provider with any episodes of hypoglycemia  Medication management . Pharmacist Clinical Goal(s): o Over the next 90 days, patient will work with PharmD and providers to maintain optimal medication adherence . Current pharmacy: [facility] . Interventions o Comprehensive medication  review performed. o Continue current medication management strategy . Patient self care activities - Over the next 90 days, patient will: o Focus on medication adherence by using a pill box o Take medications as prescribed o Report any questions or concerns to PharmD and/or provider(s)  Please see past updates related to this goal by clicking on the "Past Updates" button in the selected goal       ,  Hyperlipidemia   LDL goal < 70  Lipid Panel     Component Value Date/Time   CHOL 166 01/17/2020 0826   TRIG 89 01/17/2020 0826   HDL 42 01/17/2020 0826   LDLCALC 107 (H) 01/17/2020 0826    Hepatic Function Latest Ref Rng & Units 01/17/2020 09/13/2019 12/16/2016  Total Protein 6.0 - 8.5 g/dL 6.8 6.1 6.5  Albumin 3.8 - 4.8 g/dL 4.1 4.1 4.1  AST 0 - 40 IU/L 15 15 20  ALT 0 - 44 IU/L 4 5 14  Alk Phosphatase 48 - 121 IU/L 114 132(H) 128(H)  Total Bilirubin 0.0 - 1.2 mg/dL 0.7 0.7 0.8     The 10-year ASCVD risk score (Goff DC Jr., et al., 2013) is: 28.4%   Values used to calculate the score:     Age: 69 years     Sex: Male     Is Non-Hispanic African American: No     Diabetic: Yes     Tobacco smoker: No     Systolic Blood Pressure: 114 mmHg     Is BP treated: Yes     HDL Cholesterol: 42 mg/dL     Total Cholesterol: 166 mg/dL   Patient has failed these meds in past: Atorvastatin, rosuvastatin Patient is currently uncontrolled on the following medications:  . Aspirin ec 81 mg daily  We discussed:  diet and exercise extensively. Patient has been recommended to begin a statin for cholesterol. Patient's ASCVD risk is elevated and his LDL is above goal. Patient has tried/failed atorvastatin in the past. Pharmacist will encourage statin on follow-up visit.   Update 02/03/2020 - Cardiology began rosuvastatin 10 mg daily. Pharmacist encouraged patient of the benefits of taking statin. Will review lipid, liver and BMP in 3 months. Patient continues to exercise 4 times weekly.   Update  03/01/2020 - Patient tolerating rosuvastatin well.   Update 03/14/2020 - Patient feels that Crestor makes him feel bad and increasing blood sugar. He isn't resting well. Increased blood sugar as well. Patient has discontinued this medication on his own.   He had 5 bypasses 20 years ago. Patient sent a message to Cardiology to discuss elevated blood sugar and not feeling well. Waiting for a response.   Update 03/20/2020 - Patient stopped Crestor on his own last week. He is not interested in changing to pravastatin. Encouraged patient to follow-up with cardiology to determine if non-statin option is appropriate. Counseled patient on the benefits of statin therapy and risk reduction associated. Patient is aware but has not tolerated Lipitor in the past and does not want to resume a statin at this point. Patient plans to follow-up with cardiology.   Plan  Follow-up with cardiology to determine next steps after inability to tolerate 2 different statins.     Diabetes   Recent Relevant Labs: Lab Results  Component Value Date/Time   HGBA1C 7.5 (H) 01/17/2020 08:26 AM   HGBA1C 8.2 (H) 09/13/2019 09:13 AM   MICROALBUR 80 01/17/2020 08:38 AM     Checking BG: 3x per Day  Recent FBG Readings:64-280 Recent pre-meal BG readings: 95-420 Recent 2hr PP BG readings:  125-363 Patient has failed these meds in past: Novolog, novolin 70/30,  Patient is currently uncontrolled on the following medications:   Lantus solostar 30 units daily  Metformin 500 mg bid with a meal  insuln pen needles   Last diabetic Foot exam: No results found for: HMDIABEYEEXA  Last diabetic Eye exam: No results found for: HMDIABFOOTEX   We discussed: diet and exercise extensively. Patient has had some recent fluctuations in blood sugar. Patient can explain some recent high blood sugars with things like tortilla chips, chicken pot pie, biscuit for breakfast and McDonald[employer]. Patient exercises at the gym 3-5 times each week. He  carries a juicy juice box with him now in the event of low blood sugar. Low blood sugars are improved from previous regimen. Patient's wife is working to help with the diet piece based on education from [place]. Provided patient with a sample Dexcom CGM in office due to dexterity.   Update 02/03/2020 - Insurance denied Dexcom due to not injecting four times daily. Patient currently using sample of Libre 2 at home. Pharmacist contacted [employer] for benefit verification of Libre 2. Patient's insurance covers Tropic 2. Patient began Lantus 30 units daily and Metformin 500 mg bid with a meal. He had some overnight blood sugars in the 50s and fasting readings that were below goal. He was symptomatic jittery/shaking. Pharmacist contacted Dr. [name] and Colvin Caroli with udpate. Lantus was reduced to 25 units daily 08/04. Since reduction patient's blood sugars are improved. Fasting 08/06 93 and patient feels fine. Did not dip low overnight. Patient's blood sugar 08/05 - am 66/ lunch 129/supper 135/bedtime 178.   Discussed next month we will consider increasing metformin dose if tolerating well and further reducing Lantus to ensure reduced risk of low blood sugar. Patient is correcting appropriately with 1/2 cup of juice during low blood sugar events. Patient takes juices with him to the gym to ensure that his blood sugar doesn't become dangerously low. Exercise seems to reduce blood sugar by at least 20 mg/dL.   Update 03/01/2020 - Patient's blood sugars vary from day to day.   Recent trends:  Breakfast: 59, 67, 68, 184, 245, 127, 185  Lunch: 78, 182, 104, 148, 245, 110, 269, 205  Dinner: 145, 244, 185, 229, 310, 150, 227, 205  Bedtime: 167, 148, 198, 350, 294, 197, 250, 199  Patient working on diet and exercising 5 days each week. Discussed increasing metformin. Recommend  increasing metformin 500 mg- 2 tablets in the morning and 1 tablet in the evening. Patient is willing per Dr. Blanch Media approval.  [facility] Patient will monitor blood sugars and report any episode of low to pharmacist. Patient scheduled for follow-up appointment in 2 weeks.   Update 03/14/2020 -Recommend increase to Lantus to 28 units daily in the evening 03/12/2020. Patient reports low appetite and general not feeling well. Denies significant GI effects since increasing metformin.   Recent trends:   Breakfast 145-332  Lunch: [place]  Dinner: 235-369  Bedtime 245-366.   Update 03/20/2020 -  Blood sugar readings breakfast/lunch/supper/bedtime 03/14/2020:  145/164/182/223 03/15/2020:  109/147/143/158 03/16/2020:  67 (overnight)/ 142/174/190/205 03/17/2020:  81/174/158/123 03/18/2020: 55 (overnight)/ 82/190/267/269 - church lunch with mainly carbohydrates for meal 03/19/2020:  78/126/135/166 03/20/2020:  90/164  Patient reports blood sugars improved very shortly after stopping Crestor. Patient is feeling better overall. He reduced insulin dose to 25 units 03/15/2020 after sugars began improving after discontinuing Crestor. Patient is having low blood sugars the past 4 days on 25 units of Lantus. We discussed goals of therapy and patient preferences. Recommend reducing Lantus to 23 units each evening and continue Metformin 1000 mg.   Update 03/27/2020:  Recent blood glucose trends:  Breakfast: 68, 78, 90, 168, 128, 81, 159  Lunch: 75, 94, 156, 189, 185, 130, 262 Dinner: 85, 120, 154, 218, 148, 158 Bedtime: 114, 127, 180, 228 (missed dose of metformin), 112, 201   Patient has had low blood sugar in the 50s in the last week. We discussed that patient continues to exercise ~50 minutes 5 times each week. He is limiting his food portions in fear of higher blood sugar readings. Advised patient to be mindful of carbohydrate intake but not continue to be hungry.With results of low blood sugars overnight and higher bedtime readings, pharmacist recommends alter administration time of  Lantus to am vs. Pm in an attempt to reduce overnight low blood sugars. Recommended reducing dose to 22 units each morning beginning 03/28/2020. Patient will not give Lantus 03/27/2020 evening dose.   Pharmacist discussed with Colvin Caroli Hix RD, CDE and she approves of plan.   Plan  Recommend continue Metformin 1000 mg bid. Decrease Lantus to 22 units every morning. Folllow-up with pharmacist in 1 week to assess.     Medication Management   [facility] Uses pill box? Yes Pt endorses excellent compliance  We discussed: Working to avoid doughnut hole in the future and making medications more affordable.   Plan  Continue current medication management strategy    Follow up: 1 week phone visit

## 2020-03-27 ENCOUNTER — Ambulatory Visit: Payer: Medicare Other

## 2020-03-27 ENCOUNTER — Other Ambulatory Visit: Payer: Self-pay

## 2020-03-27 NOTE — Patient Instructions (Signed)
Visit Information  Goals Addressed            This Visit's Progress   . Pharmacy Care Plan       CARE PLAN ENTRY (see longitudinal plan of care for additional care plan information)  Current Barriers:  . Chronic Disease Management support, education, and care coordination needs related to Hyperlipidemia, Diabetes, and Heart Failure   Heart Failure BP Readings from Last 3 Encounters:  01/17/20 (!) 116/58  09/13/19 124/80  07/27/19 122/60   . Pharmacist Clinical Goal(s): o Over the next 90 days, patient will work with PharmD and providers to maintain BP goal <130/80 . Current regimen:  o Carvedilol 25 mg twice daily o Entresto 49-51 mg twice daily  . Interventions: o Patient's Entresto arrived via patient assistance (approved through 06/29/2020). Pharmacist will work on Therapist, music for next year once current approval expired.  . Patient self care activities - Over the next 90 days, patient will: o Check BP weekly, document, and provide at future appointments o Ensure daily salt intake < 2300 mg/day  Hyperlipidemia Lab Results  Component Value Date/Time   LDLCALC 107 (H) 01/17/2020 08:26 AM   . Pharmacist Clinical Goal(s): o Over the next 90 days, patient will work with PharmD and providers to achieve LDL goal < 70 . Current regimen:  o Aspirin ec 81 mg daily . Interventions: o Discussed benefits of statin and encouraged to continue taking statin as prescribed from cardiology.  o Keep up the good work with exercise.  o Incorporate lean meats, vegetables, fruits and whole grains in diet.  o Patient reports feeling bad and increased blood sugars since beginning Crestor. Patient has contacted cardiology to discuss. Recommend trying another statin or reduce frequency of current statin.  . Patient self care activities - Over the next 90 days, patient will: o Contact provider or pharmacist with any questions or concerns.   Diabetes Lab Results  Component Value  Date/Time   HGBA1C 7.5 (H) 01/17/2020 08:26 AM   HGBA1C 8.2 (H) 09/13/2019 09:13 AM   . Pharmacist Clinical Goal(s): o Over the next 90 days, patient will work with PharmD and providers to achieve A1c goal <7% . Current regimen:  o Metformin 1000 mg bid with meals o Lantus 22 units daily in the morning.  . Interventions: o Reviewed home blood sugar log.  o Recommend decrease Lantus 22 units daily per recent blood sugar results.  . Patient self care activities - Over the next 90 days, patient will: o Check blood sugar 3-4 times daily, document, and provide at future appointments o Contact provider with any episodes of hypoglycemia  Medication management . Pharmacist Clinical Goal(s): o Over the next 90 days, patient will work with PharmD and providers to maintain optimal medication adherence . [facility] . Interventions o Comprehensive medication review performed. o Continue current medication management strategy . Patient self care activities - Over the next 90 days, patient will: o Focus on medication adherence by using a pill box o Take medications as prescribed o Report any questions or concerns to PharmD and/or provider(s)  Please see past updates related to this goal by clicking on the "Past Updates" button in the selected goal        The patient verbalized understanding of instructions provided today and declined a print copy of patient instruction materials.   Telephone follow up appointment with pharmacy team member scheduled for: 04/03/2020  Juliane Lack, PharmD, [facility] Clinical Pharmacist [facility]  [phone] (office) [phone] (mobile)

## 2020-03-28 ENCOUNTER — Other Ambulatory Visit: Payer: Self-pay | Admitting: Legal Medicine

## 2020-03-29 ENCOUNTER — Encounter (HOSPITAL_COMMUNITY): Payer: Self-pay | Admitting: Emergency Medicine

## 2020-03-29 ENCOUNTER — Emergency Department (HOSPITAL_COMMUNITY): Payer: Medicare Other

## 2020-03-29 ENCOUNTER — Other Ambulatory Visit: Payer: Self-pay

## 2020-03-29 ENCOUNTER — Emergency Department (HOSPITAL_COMMUNITY)
Admission: EM | Admit: 2020-03-29 | Discharge: 2020-03-29 | Disposition: A | Discharge status: Home / Self Care | Payer: Medicare Other | Attending: Emergency Medicine | Admitting: Emergency Medicine

## 2020-03-29 ENCOUNTER — Telehealth: Payer: Self-pay

## 2020-03-29 DIAGNOSIS — E119 Type 2 diabetes mellitus without complications: Secondary | ICD-10-CM | POA: Diagnosis not present

## 2020-03-29 DIAGNOSIS — Z7982 Long term (current) use of aspirin: Secondary | ICD-10-CM | POA: Diagnosis not present

## 2020-03-29 DIAGNOSIS — G20 Parkinson's disease: Secondary | ICD-10-CM | POA: Insufficient documentation

## 2020-03-29 DIAGNOSIS — I110 Hypertensive heart disease with heart failure: Secondary | ICD-10-CM | POA: Diagnosis not present

## 2020-03-29 DIAGNOSIS — J45909 Unspecified asthma, uncomplicated: Secondary | ICD-10-CM | POA: Insufficient documentation

## 2020-03-29 DIAGNOSIS — Z951 Presence of aortocoronary bypass graft: Secondary | ICD-10-CM | POA: Diagnosis not present

## 2020-03-29 DIAGNOSIS — I2510 Atherosclerotic heart disease of native coronary artery without angina pectoris: Secondary | ICD-10-CM | POA: Insufficient documentation

## 2020-03-29 DIAGNOSIS — R001 Bradycardia, unspecified: Secondary | ICD-10-CM | POA: Diagnosis not present

## 2020-03-29 DIAGNOSIS — Z794 Long term (current) use of insulin: Secondary | ICD-10-CM | POA: Insufficient documentation

## 2020-03-29 DIAGNOSIS — T82118A Breakdown (mechanical) of other cardiac electronic device, initial encounter: Secondary | ICD-10-CM | POA: Diagnosis not present

## 2020-03-29 DIAGNOSIS — I5042 Chronic combined systolic (congestive) and diastolic (congestive) heart failure: Secondary | ICD-10-CM | POA: Insufficient documentation

## 2020-03-29 DIAGNOSIS — Z79899 Other long term (current) drug therapy: Secondary | ICD-10-CM | POA: Diagnosis not present

## 2020-03-29 DIAGNOSIS — J90 Pleural effusion, not elsewhere classified: Secondary | ICD-10-CM | POA: Diagnosis not present

## 2020-03-29 LAB — BASIC METABOLIC PANEL
Anion gap: 12 (ref 5–15)
BUN: 32 mg/dL — ABNORMAL HIGH (ref 8–23)
CO2: 18 mmol/L — ABNORMAL LOW (ref 22–32)
Calcium: 8.9 mg/dL (ref 8.9–10.3)
Chloride: 105 mmol/L (ref 98–111)
Creatinine, Ser: 1.03 mg/dL (ref 0.61–1.24)
GFR calc Af Amer: >60 mL/min (ref >60)
GFR calc non Af Amer: >60 mL/min (ref >60)
Glucose, Bld: 351 mg/dL — ABNORMAL HIGH (ref 70–99)
Potassium: 4.5 mmol/L (ref 3.5–5.1)
Sodium: 135 mmol/L (ref 135–145)

## 2020-03-29 LAB — CBC
HCT: 38.1 % — ABNORMAL LOW (ref 39.0–52.0)
Hemoglobin: 12.8 g/dL — ABNORMAL LOW (ref 13.0–17.0)
MCH: 31.7 pg (ref 26.0–34.0)
MCHC: 33.6 g/dL (ref 30.0–36.0)
MCV: 94.3 fL (ref 80.0–100.0)
Platelets: 176 10*3/uL (ref 150–400)
RBC: 4.04 MIL/uL — ABNORMAL LOW (ref 4.22–5.81)
RDW: 12.1 % (ref 11.5–15.5)
WBC: 7 10*3/uL (ref 4.0–10.5)
nRBC: 0 % (ref 0.0–0.2)

## 2020-03-29 LAB — TROPONIN I (HIGH SENSITIVITY)
Troponin I (High Sensitivity): 12 ng/L (ref <18)
Troponin I (High Sensitivity): 13 ng/L (ref <18)

## 2020-03-29 NOTE — Telephone Encounter (Signed)
Patient informed that his Lantus (supplied via patient assistance) is here and ready to be picked up. Medication is in the refrigerator.

## 2020-03-29 NOTE — ED Triage Notes (Signed)
Pt states he has a pacemaker and his HR is low today on the 44, pt feeling increase fatigue.  Pt states he has a Metronic pacemaker.

## 2020-03-29 NOTE — ED Notes (Signed)
Cards at bedside

## 2020-03-29 NOTE — Discharge Instructions (Addendum)
Please follow closely with your Cardiologist. Return to the ED as needed.

## 2020-03-29 NOTE — Telephone Encounter (Signed)
Spoke with pt his monitor is still not working after talking to tech support. Patient is having issues with his HR and I let pt know that I will call tech support with him on the line, but patient needs to speak with a nurse.

## 2020-03-29 NOTE — ED Provider Notes (Signed)
[facility] EMERGENCY DEPARTMENT Provider Note   CSN: [id number] Arrival date & time: 03/29/20  1814     History Chief Complaint  Patient presents with  . Fatigue    Jeffrey Parsons is a 69 y.o. male with past medical history of complete heart block with Medtronic biventricular AICD, hypertension, CAD, CHF, presenting to the emergency department with complaints of bradycardia.  Patient states today while exercising at the gym, he noted his heart rate did not get above 51 bpm while on the bicycle and the treadmill.  He felt a little bit fatigued during that time though nothing significant.  He has been feeling "off" throughout the day with some fatigue.  He also states at some point he was laying down in bed which is unusual for him.  Yesterday evening he felt a little bit more tired than usual while at church though had a uneventful night otherwise.  He is not complaining of any chest pain or shortness of breath, no syncope.  He called his cardiologist office who attempted to receive a manual transmission however his home remote box was not functioning properly therefore he came to the ED for evaluation.   Per chart review from nursing documentation today by cardiologist, patient's lower rate setting is 60 bpm.  The history is provided by the patient and medical records.       Past Medical History:  Diagnosis Date  . AICD (automatic cardioverter/defibrillator) present   . Asthma   . Chronic systolic CHF (congestive heart failure) (HCC) 05/26/2017   Echo 11/19: EF 15-20, diff HK, Gr 3 DD, mod MR, severe LAE, mod RV dilation, mod reduced RVSF, mild TR, mild PI  . Complete heart block (HCC) 05/26/2017  . Coronary artery disease involving native coronary artery of native heart without angina pectoris 09/12/2016  . Hypertension   . Ischemic cardiomyopathy 05/26/2017  . Pacemaker 05/26/2017  . Pneumonia 2019  . Retinopathy     Patient Active Problem List   Diagnosis  Date Noted  . BMI 24.0-24.9, adult 01/17/2020  . Sick sinus syndrome (HCC) 09/13/2019  . Obesity, diabetes, and hypertension syndrome (HCC) 09/13/2019  . Secondary dystonia 07/04/2019  . Essential tremor 10/12/2018  . Essential hypertension 06/26/2018  . Complete heart block (HCC) 05/26/2017  . Cardiac device in situ 05/26/2017  . Chronic combined systolic and diastolic CHF (congestive heart failure) (HCC) 05/26/2017  . Ischemic cardiomyopathy 05/26/2017  . Allergy to sulfa drugs 12/04/2016  . S/P CABG x 5 09/12/2016  . Mixed hyperlipidemia 09/12/2016  . Coronary artery disease involving native coronary artery of native heart without angina pectoris 09/12/2016  . Pacemaker 06/06/2016  . Long term (current) use of aspirin 12/06/2015  . Mild obesity 12/06/2015  . Other long term (current) drug therapy 12/06/2015  . Mild intermittent asthma with acute exacerbation 12/07/2014  . Parkinson's disease (HCC) 12/07/2014    Past Surgical History:  Procedure Laterality Date  . BI-VENTRICULAR IMPLANTABLE CARDIOVERTER DEFIBRILLATOR UPGRADE  06/25/2018  . BIV UPGRADE N/A 06/25/2018   Procedure: BIV ICD UPGRADE;  Surgeon: Duke Salvia, MD;  Location: [facility] INVASIVE CV LAB;  Service: Cardiovascular;  Laterality: N/A;  . CARDIAC CATHETERIZATION  2001; ~ 2013  . CATARACT EXTRACTION W/ INTRAOCULAR LENS  IMPLANT, BILATERAL Bilateral   . CORONARY ARTERY BYPASS GRAFT  2001   "CABG X5"  . INSERT / REPLACE / REMOVE PACEMAKER  05/2010  . KNEE SURGERY Left    AS A CHILD  . RIGHT/LEFT HEART CATH  AND CORONARY/GRAFT ANGIOGRAPHY N/A 06/16/2017   Procedure: RIGHT/LEFT HEART CATH AND CORONARY/GRAFT ANGIOGRAPHY;  Surgeon: Tonny Bollman, MD;  Location: [facility] INVASIVE CV LAB;  Service: Cardiovascular;  Laterality: N/A;  . TONSILLECTOMY         Family History  Problem Relation Age of Onset  . Bronchitis Mother   . Coronary artery disease Father   . Heart disease Father   . Hypertension Sister   .  Hypertension Brother     Social History   Tobacco Use  . Smoking status: Never Smoker  . Smokeless tobacco: Never Used  Vaping Use  . Vaping Use: Never used  Substance Use Topics  . Alcohol use: Never  . Drug use: Never    Home Medications Prior to Admission medications   Medication Sig Start Date End Date Taking? Authorizing Provider  acetaminophen (TYLENOL) 500 MG tablet Take 500 mg by mouth daily as needed for moderate pain or headache.    Yes [provider]  albuterol (PROAIR HFA) 108 (90 Base) MCG/ACT inhaler Inhale 2 puffs into the lungs every 6 (six) hours as needed for wheezing or shortness of breath.   Yes [provider]  aspirin EC 81 MG tablet Take 1 tablet (81 mg total) by mouth daily. 03/23/18  Yes Weaver, Scott T, PA-C  carbidopa-levodopa (SINEMET CR) 50-200 MG tablet Take 1 tablet by mouth every evening.    Yes [provider]  carbidopa-levodopa (SINEMET IR) 25-250 MG tablet Take 1 tablet by mouth 4 (four) times daily.    Yes [provider]  carvedilol (COREG) 25 MG tablet Take 1 tablet (25 mg total) by mouth 2 (two) times daily. 01/17/20  Yes Nahser, [name], MD  insulin glargine (LANTUS SOLOSTAR) 100 UNIT/ML Solostar Pen Inject 30 Units into the skin daily. Stop insulin 70/30, keep check in sugars Patient taking differently: Inject 22 Units into the skin in the morning.  01/09/20  Yes Abigail Miyamoto, MD  metFORMIN (GLUCOPHAGE) 500 MG tablet Take 2 tablets (1,000 mg total) by mouth 2 (two) times daily with a meal. Start this with new insulin Patient taking differently: Take 1,000 mg by mouth 2 (two) times daily with a meal.  03/01/20  Yes Abigail Miyamoto, MD  Multiple Vitamin (MULTIVITAMIN) tablet Take 1 tablet by mouth daily.   Yes [provider]  sacubitril-valsartan (ENTRESTO) 49-51 MG Take 1 tablet by mouth 2 (two) times daily. 07/11/19  Yes Nahser, [name], MD  topiramate (TOPAMAX) 50 MG tablet Take 50 mg  by mouth 2 (two) times daily. 01/17/19  Yes [provider]  vitamin B-12 (CYANOCOBALAMIN) 1000 MCG tablet Take 1,000 mcg by mouth daily.   Yes [provider]  Continuous Blood Gluc Sensor (DEXCOM G6 SENSOR) MISC 1 each by Does not apply route once a week. Patient not taking: Reported on 02/03/2020 01/19/20   Abigail Miyamoto, MD  Continuous Blood Gluc Sensor (FREESTYLE LIBRE 2 SENSOR) MISC by Does not apply route.    [provider]  Insulin Pen Needle (PEN NEEDLES 5/16") 30G X 8 MM MISC by Does not apply route.    [provider]  rosuvastatin (CRESTOR) 10 MG tablet Take 1 tablet (10 mg total) by mouth daily. Patient not taking: Reported on 03/29/2020 01/30/20 01/24/21  Nahser, [name], MD    Allergies    Onion and Sulfa antibiotics  Review of Systems   Review of Systems  Constitutional: Positive for fatigue.  Respiratory: Negative for shortness of breath.  Cardiovascular: Negative for chest pain.  All other systems reviewed and are negative.   Physical Exam Updated Vital Signs BP (!) 150/62   Pulse 85   Temp 98.4 F (36.9 C) (Oral)   Resp 13   Ht 5\' 8"  (1.727 m)   Wt 70.3 kg   SpO2 97%   BMI 23.57 kg/m   Physical Exam Vitals and nursing note reviewed.  Constitutional:      General: He is not in acute distress.    Appearance: He is well-developed. He is not ill-appearing.  HENT:     Head: Normocephalic and atraumatic.  Eyes:     Conjunctiva/sclera: Conjunctivae normal.  Cardiovascular:     Rate and Rhythm: Bradycardia present.  Pulmonary:     Effort: Pulmonary effort is normal. No respiratory distress.     Breath sounds: Normal breath sounds.  Abdominal:     General: Bowel sounds are normal.     Palpations: Abdomen is soft.     Tenderness: There is no abdominal tenderness.  Musculoskeletal:     Right lower leg: No edema.     Left lower leg: No edema.  Skin:    General: Skin is warm.  Neurological:     Mental Status: He  is alert.     Comments: mentating appropriately  Psychiatric:        Behavior: Behavior normal.     ED Results / Procedures / Treatments   Labs (all labs ordered are listed, but only abnormal results are displayed) Labs Reviewed  BASIC METABOLIC PANEL - Abnormal; Notable for the following components:      Result Value   CO2 18 (*)    Glucose, Bld 351 (*)    BUN 32 (*)    All other components within normal limits  CBC - Abnormal; Notable for the following components:   RBC 4.04 (*)    Hemoglobin 12.8 (*)    HCT 38.1 (*)    All other components within normal limits  URINALYSIS, ROUTINE W REFLEX MICROSCOPIC  TROPONIN I (HIGH SENSITIVITY)  TROPONIN I (HIGH SENSITIVITY)    EKG EKG Interpretation  Date/Time:  Thursday March 29 2020 18:33:41 EDT Ventricular Rate:  46 PR Interval:  132 QRS Duration: 144 QT Interval:  602 QTC Calculation: 526 R Axis:   -74 Text Interpretation: Atrial-sensed ventricular-paced rhythm Biventricular pacemaker detected Abnormal ECG Since last tracing rate slower Confirmed by 12-26-1988 [phone]) on 03/29/2020 7:35:10 PM   Radiology DG Chest 2 View  Result Date: 03/29/2020 CLINICAL DATA:  Low heart rate EXAM: CHEST - 2 VIEW COMPARISON:  06/26/2018 FINDINGS: Post sternotomy changes. Left-sided multi lead pacing device as before. No focal opacity. Tiny right pleural effusion. Normal heart size. No pneumothorax. IMPRESSION: Tiny right pleural effusion. Electronically Signed   By: 06/28/2018 M.D.   On: 03/29/2020 21:42    Procedures Procedures (including critical care time)  Medications Ordered in ED Medications - No data to display  ED Course  I have reviewed the triage vital signs and the nursing notes.  Pertinent labs & imaging results that were available during my care of the patient were reviewed by me and considered in my medical decision making (see chart for details).  Clinical Course as of Mar 30 2251  Thu Mar 29, 2020  2132  Dr. Mar 31, 2020 with cardiology evaluated pt and medtronic device. It seems as though his RV lead was oversensing, therefore she changed sensitivity settings and he is at normal rate in the  6280s. She ordered 2 view chest to evaluate proper placement of leads, medtronics rep to come to ED double check new settings, otherwise pt safe for discharge.    [JR]    Clinical Course User Index [JR] Shakirra Buehler, [place] N, PA-C   MDM Rules/Calculators/A&P                          Patient presenting with symptomatic bradycardia, symptoms began last night.  He has biventricular AICD in place for complete heart block, Medtronic device has a lower rate setting of 60 bpm, however patient arrives with heart rate in the 40s.  Mentating appropriately, no chest pain or shortness of breath.  He mainly complains of fatigue.  Medtronic device was interrogated and no events are noted.  Consulted with cardiology, who evaluated patient and readjusted Medtronics settings.  It appears his RV lead was over sensing therefore under pacing.  She changed sensitivity settings and he is now at a normal rate in the 80s with symptom improvement.  2 view chest x-ray reveals unchanged lead placement.  [employer] rep also evaluated patient to double check his new settings and signed off.  Patient has no further complaints at this time, labs are unremarkable with exception for hyperglycemia.  He has no other complaints at this time, is appropriate for discharge with close outpatient cardiology follow-up.  He is instructed of strict return precautions.  Discussed results, findings, treatment and follow up. Patient advised of return precautions. Patient verbalized understanding and agreed with plan.  Final Clinical Impression(s) / ED Diagnoses Final diagnoses:  Bradycardia  AICD malfunction, initial encounter    Rx / DC Orders ED Discharge Orders    None       Sha Burling, [place] N, PA-C 03/29/20 2252    Jacalyn LefevreHaviland, Julie, MD 03/29/20  2256

## 2020-03-29 NOTE — Consult Note (Signed)
Cardiology Consultation:   Patient ID: Jeffrey Parsons MRN: [id number]; DOB: [birth year]  Admit date: 03/29/2020 Date of Consult: 03/29/2020  Primary Care Provider: Abigail MiyamotoPerry, [name], MD [facility] Cardiologist: Kristeen MissPhilip Nahser, MD   [facility] Electrophysiologist:  Sherryl MangesSteven Klein, MD     Patient Profile:   Jeffrey Parsons is a 69 y.o. male with a hx of ICM (20-25%) s/p CRT-D, sick sinus syndrome, CAD s/p CABG and HTN who is being seen today for the evaluation of bradycardia at the request of the ER.  History of Present Illness:   Jeffrey Parsons was in his usual state of health until this morning when he noted feeling fatigued and lightheaded. He preceded to the gym as normal but continued to feel poorly. He checked his vitals while at the gym and found his heart rate to be in the 40s. He tried to do a remote interrogation on his home monitor but there was an error and it was unable to read/transmit. He called the office due to persistent HR in the 40s and was instructed to come to the ER.  In the ER, he had intermittent episodes of heart rates in the 40s associated with mild chest pressure and lightheadedness. His symptoms would resolve when his heart rate improved to the 80s. He otherwise reported feeling well without any shortness of breath, lower leg swelling, orthopnea, PND, or syncope.    Past Medical History:  Diagnosis Date  . AICD (automatic cardioverter/defibrillator) present   . Asthma   . Chronic systolic CHF (congestive heart failure) (HCC) 05/26/2017   Echo 11/19: EF 15-20, diff HK, Gr 3 DD, mod MR, severe LAE, mod RV dilation, mod reduced RVSF, mild TR, mild PI  . Complete heart block (HCC) 05/26/2017  . Coronary artery disease involving native coronary artery of native heart without angina pectoris 09/12/2016  . Hypertension   . Ischemic cardiomyopathy 05/26/2017  . Pacemaker 05/26/2017  . Pneumonia 2019  . Retinopathy     Past Surgical History:  Procedure  Laterality Date  . BI-VENTRICULAR IMPLANTABLE CARDIOVERTER DEFIBRILLATOR UPGRADE  06/25/2018  . BIV UPGRADE N/A 06/25/2018   Procedure: BIV ICD UPGRADE;  Surgeon: Duke SalviaKlein, Steven C, MD;  [facility];  Service: Cardiovascular;  Laterality: N/A;  . CARDIAC CATHETERIZATION  2001; ~ 2013  . CATARACT EXTRACTION W/ INTRAOCULAR LENS  IMPLANT, BILATERAL Bilateral   . CORONARY ARTERY BYPASS GRAFT  2001   "CABG X5"  . INSERT / REPLACE / REMOVE PACEMAKER  05/2010  . KNEE SURGERY Left    AS A CHILD  . RIGHT/LEFT HEART CATH AND CORONARY/GRAFT ANGIOGRAPHY N/A 06/16/2017   Procedure: RIGHT/LEFT HEART CATH AND CORONARY/GRAFT ANGIOGRAPHY;  Surgeon: Tonny Bollmanooper, Michael, MD;  [facility];  Service: Cardiovascular;  Laterality: N/A;  . TONSILLECTOMY       Home Medications:  Prior to Admission medications   Medication Sig Start Date End Date Taking? Authorizing Provider  acetaminophen (TYLENOL) 500 MG tablet Take 500 mg by mouth daily as needed for moderate pain or headache.     [provider]  albuterol (PROAIR HFA) 108 (90 Base) MCG/ACT inhaler Inhale 2 puffs into the lungs every 6 (six) hours as needed for wheezing or shortness of breath.    [provider]  aspirin EC 81 MG tablet Take 1 tablet (81 mg total) by mouth daily. 03/23/18   Tereso NewcomerWeaver, Scott T, PA-C  carbidopa-levodopa (SINEMET CR) 50-200 MG tablet Take 1 tablet by mouth every evening.  [provider]  carbidopa-levodopa (SINEMET IR) 25-250 MG tablet Take 1 tablet by mouth 4 (four) times daily.     [provider]  carvedilol (COREG) 25 MG tablet Take 1 tablet (25 mg total) by mouth 2 (two) times daily. 01/17/20   Nahser, [name], MD  Continuous Blood Gluc Sensor (DEXCOM G6 SENSOR) MISC 1 each by Does not apply route once a week. Patient not taking: Reported on 02/03/2020 01/19/20   Abigail Miyamoto, MD  Continuous Blood Gluc Sensor (FREESTYLE LIBRE 2 SENSOR) MISC by Does not  apply route.    [provider]  insulin glargine (LANTUS SOLOSTAR) 100 UNIT/ML Solostar Pen Inject 30 Units into the skin daily. Stop insulin 70/30, keep check in sugars Patient taking differently: Inject 22 Units into the skin in the morning. Stop insulin 70/30, keep check in sugars 01/09/20   Abigail Miyamoto, MD  Insulin Pen Needle (PEN NEEDLES 5/16") 30G X 8 MM MISC by Does not apply route.    [provider]  metFORMIN (GLUCOPHAGE) 500 MG tablet Take 2 tablets (1,000 mg total) by mouth 2 (two) times daily with a meal. Start this with new insulin 03/01/20   Abigail Miyamoto, MD  Multiple Vitamin (MULTIVITAMIN) tablet Take 1 tablet by mouth daily.    [provider]  rosuvastatin (CRESTOR) 10 MG tablet Take 1 tablet (10 mg total) by mouth daily. 01/30/20 01/24/21  Nahser, [name], MD  sacubitril-valsartan (ENTRESTO) 49-51 MG Take 1 tablet by mouth 2 (two) times daily. 07/11/19   Nahser, [name], MD  topiramate (TOPAMAX) 50 MG tablet Take 50 mg by mouth 2 (two) times daily. 01/17/19   [provider]  vitamin B-12 (CYANOCOBALAMIN) 1000 MCG tablet Take 1,000 mcg by mouth daily.    [provider]  vitamin C (ASCORBIC ACID) 500 MG tablet Take 1,000 mg by mouth daily.     [provider]    Inpatient Medications: Scheduled Meds:  Continuous Infusions:  PRN Meds:   Allergies:    Allergies  Allergen Reactions  . Onion     Upset stomach  . Sulfa Antibiotics Other (See Comments)    FLUSH     Social History:   Social History   Socioeconomic History  . Marital status: Married    Spouse name: Not on file  . Number of children: Not on file  . Years of education: Not on file  . Highest education level: Not on file  Occupational History  . Not on file  Tobacco Use  . Smoking status: Never Smoker  . Smokeless tobacco: Never Used  Vaping Use  . Vaping Use: Never used  Substance and Sexual Activity  . Alcohol use: Never    . Drug use: Never  . Sexual activity: Yes  Other Topics Concern  . Not on file  Social History Narrative  . Not on file   Social Determinants of Health   Financial Resource Strain:   . Difficulty of Paying Living Expenses: Not on file  Food Insecurity: No Food Insecurity  . Worried About Programme researcher, broadcasting/film/video in the Last Year: Never true  . Ran Out of Food in the Last Year: Never true  Transportation Needs: No Transportation Needs  . Lack of Transportation (Medical): No  . Lack of Transportation (Non-Medical): No  Physical Activity: Insufficiently Active  . Days of Exercise per Week: 3 days  . Minutes of Exercise per Session: 30 min  Stress:   . Feeling  of Stress : Not on file  Social Connections:   . Frequency of Communication with Friends and Family: Not on file  . Frequency of Social Gatherings with Friends and Family: Not on file  . Attends Religious Services: Not on file  . Active Member of Clubs or Organizations: Not on file  . Attends Banker Meetings: Not on file  . Marital Status: Not on file  Intimate Partner Violence:   . Fear of Current or Ex-Partner: Not on file  . Emotionally Abused: Not on file  . Physically Abused: Not on file  . Sexually Abused: Not on file    Family History:    Family History  Problem Relation Age of Onset  . Bronchitis Mother   . Coronary artery disease Father   . Heart disease Father   . Hypertension Sister   . Hypertension Brother      ROS:  Please see the history of present illness.  All other ROS reviewed and negative.     Physical Exam/Data:   Vitals:   03/29/20 1819 03/29/20 1835 03/29/20 1930 03/29/20 2030  BP: (!) 138/47  (!) 152/61 (!) 150/62  Pulse: (!) 44  (!) 47 85  Resp: 16  17 13   Temp: 98.4 F (36.9 C)     TempSrc: Oral     SpO2: 96%  97% 97%  Weight: 70.3 kg 70.3 kg    Height: 5\' 8"  (1.727 m) 5\' 8"  (1.727 m)     No intake or output data in the 24 hours ending 03/29/20 2137 Last 3 Weights  03/29/2020 03/29/2020 01/30/2020  Weight (lbs) 154 lb 15.7 oz 155 lb 167 lb 12.2 oz  Weight (kg) 70.3 kg 70.308 kg 76.095 kg     Body mass index is 23.57 kg/m.  General:  Well nourished, well developed, in no acute distress HEENT: normal Neck: no JVD Cardiac:  normal S1, S2; RRR (when HR in 80s); no murmurs Lungs:  clear to auscultation bilaterally, Breathing comfortably on room air Abd: soft, nontender, no hepatomegaly  Ext: no edema Musculoskeletal:  No deformities, BUE and BLE strength normal and equal Skin: warm and dry  Neuro:  CNs 2-12 intact, no focal abnormalities noted Psych:  Normal affect   EKG:  The EKG was personally reviewed and demonstrates: Sinus rhythm with complete heart block and V-pacing at a rate ~44bpm (every other atrial beat appears to have a BiV paced beat corresponding to it.   On device interrogation, he had a VS event corresponding to his dropped beats on telemetry. He is set at DDD with a lower rate limit of 60bpm. His RV sensitivity was 0.56mV and was changed to 0.26mV with resolution of his oversensed events.  Relevant CV Studies: Echo 06/14/19 1. Left ventricular ejection fraction, by visual estimation, is 20 to  25%. The left ventricle has severely decreased function. There is no left  ventricular hypertrophy.  2. Abnormal septal motion consistent with post-operative status.  3. Indeterminate diastolic filling due to E-A fusion.  4. The left ventricle demonstrates global hypokinesis.  5. Global right ventricle has normal systolic function.The right  ventricular size is normal. No increase in right ventricular wall  thickness.  6. Left atrial size was normal.  7. Right atrial size was normal.  8. The mitral valve is grossly normal. No evidence of mitral valve  regurgitation.  9. The tricuspid valve is grossly normal. Tricuspid valve regurgitation  is trivial.  10. The aortic valve is tricuspid. Aortic valve  regurgitation is not  visualized.  No evidence of aortic valve sclerosis or stenosis.  11. The pulmonic valve was grossly normal. Pulmonic valve regurgitation is  trivial.  12. Normal pulmonary artery systolic pressure.  13. The tricuspid regurgitant velocity is 1.98 m/s, and with an assumed  right atrial pressure of 3 mmHg, the estimated right ventricular systolic  pressure is normal at 18.7 mmHg.  14. A pacer wire is visualized in the RA and RV.  15. The inferior vena cava is normal in size with greater than 50%  respiratory variability, suggesting right atrial pressure of 3 mmHg.  16. A prior study was performed on 05/17/2018.  17. Changes from prior study are noted.  18. EF slightly improved.  19. Rhythm strip during this exam demonstrated normal sinus rhythm.  20. The interatrial septum is aneurysmal.   Laboratory Data:  High Sensitivity Troponin:   Recent Labs  Lab 03/29/20 1850  TROPONINIHS 12     Chemistry Recent Labs  Lab 03/29/20 1850  NA 135  K 4.5  CL 105  CO2 18*  GLUCOSE 351*  BUN 32*  CREATININE 1.03  CALCIUM 8.9  GFRNONAA >60  GFRAA >60  ANIONGAP 12    No results for input(s): PROT, ALBUMIN, AST, ALT, ALKPHOS, BILITOT in the last 168 hours. Hematology Recent Labs  Lab 03/29/20 1850  WBC 7.0  RBC 4.04*  HGB 12.8*  HCT 38.1*  MCV 94.3  MCH 31.7  MCHC 33.6  RDW 12.1  PLT 176   BNPNo results for input(s): BNP, PROBNP in the last 168 hours.  DDimer No results for input(s): DDIMER in the last 168 hours.   Radiology/Studies:  DG Chest 2 View  Result Date: 03/29/2020 CLINICAL DATA:  Low heart rate EXAM: CHEST - 2 VIEW COMPARISON:  06/26/2018 FINDINGS: Post sternotomy changes. Left-sided multi lead pacing device as before. No focal opacity. Tiny right pleural effusion. Normal heart size. No pneumothorax. IMPRESSION: Tiny right pleural effusion. Electronically Signed   By: Jasmine Pang M.D.   On: 03/29/2020 21:42     Assessment and Plan:    Jeffrey Parsons is being seen for  symptomatic bradycardia. With the assistance of Dr. Hillery Jacks, his device was interrogated and demonstrated oversensing, likely oversensing the T-wave, leading to a VS event and lack of pacing. His RV sensitivity was increased from 0.3 to 0.45 with resolution of his oversensed events. He was asymptomatic with his native HR in the 80s. He had no further episodes of dropped beats on telemetry and CXR showed no evidence of lead fracture or dislocation. From a cardiac standpoint, he is ok to go home. I will let Dr. Graciela Husbands, his outpatient EP, know and he will follow-up in device clinic.  For questions or updates, please contact [facility] Please consult [email or web address] for contact info under    Signed, Nicoletta Ba, MD  03/29/2020 9:37 PM

## 2020-03-29 NOTE — Telephone Encounter (Signed)
Called patient to assist with transmission.   Patients monitor box is read code "3248." Advised patient to call Medtr[employer] for assistance. Patient verbalized understanding. Requested patient to call us back when he has an update.

## 2020-03-29 NOTE — Telephone Encounter (Signed)
Returning patients phone call.   Speaking to patients wife, states patient doesn't feel well and expresses her concerns.   Explained to patient that his LRL is set to 60 bpm. His heart rate will not go lower than that. Explained the pulse readers are not always the most accurate to measure the bpm.   Patient reports of generalized fatigue and lightheaded. Denies chest pain or shortness of breath. States he is laying down just does no feel well. Patients voice sounds very weak. Unable to receive manual transmission d/t issue with home remote box. Advised patient since he is symptomatic and does not feel any improvement, it would be best for him to go to the ED for evaluation. Patient agreed and states he will go to [facility].  Advised patient not to drive to have someone else drive or call EMS. Verbalized understanding.  Advised patient to call us back if he has any further questions or concerns. Verbalized understanding.

## 2020-03-30 ENCOUNTER — Telehealth: Payer: Self-pay | Admitting: Emergency Medicine

## 2020-03-30 NOTE — Telephone Encounter (Signed)
Patient called and he is schedule for follow -up with Dr Graciela Husbands on Monday morning 04/02/20 at 0930 for hospital follow -up . Patient given ED precautions.

## 2020-03-30 NOTE — Telephone Encounter (Signed)
-----   Message from Hillis Range, MD sent at 03/29/2020  9:32 PM EDT ----- Patient presented to the ED tonight with heart rates 40s.  Appears to have had t wave oversensing leading to inhibition of pacing.  I dont know why this occurred all of a sudden...  This improved with changing the sensitivity from 0.3 to 0.45.  I have asked the cardiology fellow to obtain a chest xray and to observe on tele for an hour or so before discharge.  The patient should probably follow-up for an in-office interrogation on the device clinic schedule either tomorrow (Friday) or early next week on a day that Dr Graciela Husbands is in the office.

## 2020-04-02 ENCOUNTER — Other Ambulatory Visit: Payer: Self-pay

## 2020-04-02 ENCOUNTER — Ambulatory Visit (INDEPENDENT_AMBULATORY_CARE_PROVIDER_SITE_OTHER): Payer: Medicare Other | Admitting: Internal Medicine

## 2020-04-02 ENCOUNTER — Encounter: Payer: Self-pay | Admitting: Internal Medicine

## 2020-04-02 VITALS — BP 102/54 | HR 88 | Ht 68.0 in | Wt 157.4 lb

## 2020-04-02 DIAGNOSIS — I255 Ischemic cardiomyopathy: Secondary | ICD-10-CM | POA: Diagnosis not present

## 2020-04-02 DIAGNOSIS — Z959 Presence of cardiac and vascular implant and graft, unspecified: Secondary | ICD-10-CM | POA: Diagnosis not present

## 2020-04-02 DIAGNOSIS — I442 Atrioventricular block, complete: Secondary | ICD-10-CM | POA: Diagnosis not present

## 2020-04-02 LAB — CUP PACEART INCLINIC DEVICE CHECK
Battery Remaining Longevity: 83 mo
Battery Voltage: 2.96 V
Brady Statistic AP VP Percent: 0.18 %
Brady Statistic AP VS Percent: 0.01 %
Brady Statistic AS VP Percent: 99.01 %
Brady Statistic AS VS Percent: 0.8 %
Brady Statistic RA Percent Paced: 0.19 %
Brady Statistic RV Percent Paced: 99.05 %
Date Time Interrogation Session: 20211004101100
HighPow Impedance: 59 Ohm
Implantable Lead Implant Date: 20111213
Implantable Lead Implant Date: 20111213
Implantable Lead Implant Date: 20191230
Implantable Lead Implant Date: 20191230
Implantable Lead Location: 753858
Implantable Lead Location: 753859
Implantable Lead Location: 753860
Implantable Lead Location: 753860
Implantable Lead Model: 5076
Implantable Lead Model: 5076
Implantable Pulse Generator Implant Date: 20191230
Lead Channel Impedance Value: 1216 Ohm
Lead Channel Impedance Value: 1216 Ohm
Lead Channel Impedance Value: 1254 Ohm
Lead Channel Impedance Value: 235.98 Ohm
Lead Channel Impedance Value: 243.712
Lead Channel Impedance Value: 284.712
Lead Channel Impedance Value: 315.129
Lead Channel Impedance Value: 329.069
Lead Channel Impedance Value: 399 Ohm
Lead Channel Impedance Value: 437 Ohm
Lead Channel Impedance Value: 437 Ohm
Lead Channel Impedance Value: 494 Ohm
Lead Channel Impedance Value: 513 Ohm
Lead Channel Impedance Value: 551 Ohm
Lead Channel Impedance Value: 817 Ohm
Lead Channel Impedance Value: 836 Ohm
Lead Channel Impedance Value: 893 Ohm
Lead Channel Impedance Value: 931 Ohm
Lead Channel Pacing Threshold Amplitude: 0.5 V
Lead Channel Pacing Threshold Amplitude: 0.875 V
Lead Channel Pacing Threshold Amplitude: 1.5 V
Lead Channel Pacing Threshold Pulse Width: 0.4 ms
Lead Channel Pacing Threshold Pulse Width: 0.4 ms
Lead Channel Pacing Threshold Pulse Width: 0.4 ms
Lead Channel Sensing Intrinsic Amplitude: 2.125 mV
Lead Channel Sensing Intrinsic Amplitude: 2.125 mV
Lead Channel Sensing Intrinsic Amplitude: 2.4 mV
Lead Channel Setting Pacing Amplitude: 1.75 V
Lead Channel Setting Pacing Amplitude: 2 V
Lead Channel Setting Pacing Amplitude: 2 V
Lead Channel Setting Pacing Pulse Width: 0.4 ms
Lead Channel Setting Pacing Pulse Width: 0.4 ms
Lead Channel Setting Sensing Sensitivity: 0.45 mV

## 2020-04-02 NOTE — Patient Instructions (Signed)

## 2020-04-02 NOTE — Progress Notes (Signed)
MRSA yes       Patient Care Team: Abigail Miyamoto, MD as PCP - General (Family Medicine) Nahser, [name], MD as PCP - Cardiology (Cardiology) Duke Salvia, MD as PCP - Electrophysiology (Cardiology) Earvin Hansen, [facility] (Pharmacist)   HPI  Jeffrey Parsons is a 69 y.o. male Seen in follow-up for pacemaker implanted in 2011 for syncope with complete heart block.  No recurrent syncope.  Underwent CRT Medtronic  upgrade 12/19 When he was seen a year ago, hoping been that he would be able to be put on guideline directed therapy; carvedilol was initiated.  Losartan was continued but not changed to [facility] because of low blood pressure.  Coronary artery disease and bypass surgery 2002.  Seen in the emergency room last week by the fellow and overseen by Dr. Fawn Kirk.  Bradycardia was ascribed to T wave oversensing, with blanking of the subsequent P wave and 2: 1P synchronous pacing.  Ventricular sensitivity was decreased from 0.3-0.45 with resolution.   Functional status is largely limited by his Parkinson's.  Denies chest pain or edema.  DATE TEST EF   11/17 Myoview  19 % No ischemia/prior infarcts  11/17 Echo  25 %   12/18 LHC  LIMA-LAD, SVG-D, Rad-OM patent SVG-PD;SVG-PLA occluded  11/19 Echo 15-25% LAE severe (52/2.5/39) MR mod  12/20 Echo  20%       Date Cr K  6/18 0.83 4.4  10/19 0.89 4.5  2/20 0.93 4.4  6/20 0.88 4.4      s.   Records and Results Reviewed  Past Medical History:  Diagnosis Date  . AICD (automatic cardioverter/defibrillator) present   . Asthma   . Chronic systolic CHF (congestive heart failure) (HCC) 05/26/2017   Echo 11/19: EF 15-20, diff HK, Gr 3 DD, mod MR, severe LAE, mod RV dilation, mod reduced RVSF, mild TR, mild PI  . Complete heart block (HCC) 05/26/2017  . Coronary artery disease involving native coronary artery of native heart without angina pectoris 09/12/2016  . Hypertension   . Ischemic cardiomyopathy 05/26/2017  .  Pacemaker 05/26/2017  . Pneumonia 2019  . Retinopathy     Past Surgical History:  Procedure Laterality Date  . BI-VENTRICULAR IMPLANTABLE CARDIOVERTER DEFIBRILLATOR UPGRADE  06/25/2018  . BIV UPGRADE N/A 06/25/2018   Procedure: BIV ICD UPGRADE;  Surgeon: Duke Salvia, MD;  Location: [facility] INVASIVE CV LAB;  Service: Cardiovascular;  Laterality: N/A;  . CARDIAC CATHETERIZATION  2001; ~ 2013  . CATARACT EXTRACTION W/ INTRAOCULAR LENS  IMPLANT, BILATERAL Bilateral   . CORONARY ARTERY BYPASS GRAFT  2001   "CABG X5"  . INSERT / REPLACE / REMOVE PACEMAKER  05/2010  . KNEE SURGERY Left    AS A CHILD  . RIGHT/LEFT HEART CATH AND CORONARY/GRAFT ANGIOGRAPHY N/A 06/16/2017   Procedure: RIGHT/LEFT HEART CATH AND CORONARY/GRAFT ANGIOGRAPHY;  Surgeon: Tonny Bollman, MD;  Location: [facility] INVASIVE CV LAB;  Service: Cardiovascular;  Laterality: N/A;  . TONSILLECTOMY      Current Meds  Medication Sig  . acetaminophen (TYLENOL) 500 MG tablet Take 500 mg by mouth daily as needed for moderate pain or headache.   . albuterol (PROAIR HFA) 108 (90 Base) MCG/ACT inhaler Inhale 2 puffs into the lungs every 6 (six) hours as needed for wheezing or shortness of breath.  [name] aspirin EC 81 MG tablet Take 1 tablet (81 mg total) by mouth daily.  . carbidopa-levodopa (SINEMET CR) 50-200 MG tablet Take 1 tablet by mouth every evening.   [name]  carbidopa-levodopa (SINEMET IR) 25-250 MG tablet Take 1 tablet by mouth 4 (four) times daily.   . carvedilol (COREG) 25 MG tablet Take 1 tablet (25 mg total) by mouth 2 (two) times daily.  . Continuous Blood Gluc Sensor (DEXCOM G6 SENSOR) MISC 1 each by Does not apply route once a week.  . Continuous Blood Gluc Sensor (FREESTYLE LIBRE 2 SENSOR) MISC by Does not apply route.  . insulin glargine (LANTUS SOLOSTAR) 100 UNIT/ML Solostar Pen Inject 30 Units into the skin daily. Stop insulin 70/30, keep check in sugars (Patient taking differently: Inject 22 Units into the skin in the morning. )    . Insulin Pen Needle (PEN NEEDLES 5/16") 30G X 8 MM MISC by Does not apply route.  . metFORMIN (GLUCOPHAGE) 500 MG tablet Take 2 tablets (1,000 mg total) by mouth 2 (two) times daily with a meal. Start this with new insulin (Patient taking differently: Take 1,000 mg by mouth 2 (two) times daily with a meal. )  . Multiple Vitamin (MULTIVITAMIN) tablet Take 1 tablet by mouth daily.  . sacubitril-valsartan (ENTRESTO) 49-51 MG Take 1 tablet by mouth 2 (two) times daily.  [name] topiramate (TOPAMAX) 50 MG tablet Take 50 mg by mouth 2 (two) times daily.  . vitamin B-12 (CYANOCOBALAMIN) 1000 MCG tablet Take 1,000 mcg by mouth daily.    Allergies  Allergen Reactions  . Onion     Upset stomach  . Sulfa Antibiotics Other (See Comments)    FLUSH       Review of Systems negative except from HPI and PMH  Physical Exam BP (!) 102/54   Pulse 88   Ht 5\' 8"  (1.727 m)   Wt 157 lb 6.4 oz (71.4 kg)   SpO2 98%   BMI 23.93 kg/m  Well developed and well nourished in no acute distress HENT normal Neck supple with JVP-flat Clear Device pocket well healed; without hematoma or erythema.  There is no tethering  Regular rate and rhythm, no   murmur Abd-soft with active BS No Clubbing cyanosis   edema Skin-warm and dry A & Oriented  Grossly normal sensory and motor function  ECG sinus with P synchronous pacing with 2: 1 conduction.  Assessment and  Plan Cardiomyopathy question mechanism  Ischemic heart disease with prior bypass surgery  Congestive heart failure-acute chronic-systolic-class IIb-IIIa  Complete heart block  Sinus tach   ICD -CRT Medtronic DOI 12/19 The patient's device was interrogated.  The information was reviewed. No changes were made in the programming.      Without symptoms of ischemia.  2: 1 conduction addressed by reprogramming of his ventricular sensitivity.  Euvolemic.  Parkinson's stable.  Without orthostasis.

## 2020-04-02 NOTE — Chronic Care Management (AMB) (Signed)
Chronic Care Management Pharmacy  Name: Jeffrey Parsons  MRN: [id number] DOB: 22-Feb-1951   Chief Complaint/ HPI  Jeffrey Parsons,  69 y.o. , male presents for their Follow-Up CCM visit with the clinical pharmacist via telephone due to COVID-19 Pandemic.  PCP : [name], MD  Their chronic conditions include: CAD, HTN, mild intermittent asthma, essential tremor, parkinson's disease, HLD, DM.   Office Visits: 01/17/2020 - CBC normal, blood sugar 201, kidney and liver tests ok, LDL cholesterol high 107 consider statin, A1c 7.5 09/13/2019 - CBC normal, glucose 201, kidney and liver tests normal, cholesterol normal, A1c 8.2 high need to increase lantus to 30 units at night.  Consult Visit: 02/21/2020 - Neurology - Parkinson's follow-up visit. Please keep track of when you notice the toe curling problem, and how it relates to the dosing of the medicationI will prescribe Carbidopa-Levodopa 25/250 mg tabs. Please take 1 tab four times daily INSTEAD of the 2 tablets of regular (25/100 mg tabs) carbidopa-Levodopa that you are taking now 01/30/2020 - Cardio- Rosuvastatin 10 mg daily prescribed due to LDL 109. Draw lipid and liver panel in 3 months with PCP.  01/11/2020 - remote pacemaker check.  10/12/2019 - remote pacemaker check.  08/04/2019 - Optometry - Diabetes eye exam.  07/27/2019 - Cardiology W[place]ine - continue current meds. Sinus tachycardia ivabradine is an option but not inclined to prescribe due to Parkinson's- max dose of carvedilol already.  07/27/2019 - Cardiology Nahser  - BP well controlled. Lipid labs checked at next visit. Non angina Medications: Outpatient Encounter Medications as of 04/03/2020  Medication Sig  . acetaminophen (TYLENOL) 500 MG tablet Take 500 mg by mouth daily as needed for moderate pain or headache.   . albuterol (PROAIR HFA) 108 (90 Base) MCG/ACT inhaler Inhale 2 puffs into the lungs every 6 (six) hours as needed for wheezing or  shortness of breath.  [name] aspirin EC 81 MG tablet Take 1 tablet (81 mg total) by mouth daily.  . carbidopa-levodopa (SINEMET CR) 50-200 MG tablet Take 1 tablet by mouth every evening.   . carbidopa-levodopa (SINEMET IR) 25-250 MG tablet Take 1 tablet by mouth 4 (four) times daily.   . carvedilol (COREG) 25 MG tablet Take 1 tablet (25 mg total) by mouth 2 (two) times daily.  . Continuous Blood Gluc Sensor (DEXCOM G6 SENSOR) MISC 1 each by Does not apply route once a week.  . Continuous Blood Gluc Sensor (FREESTYLE LIBRE 2 SENSOR) MISC by Does not apply route.  . insulin glargine (LANTUS SOLOSTAR) 100 UNIT/ML Solostar Pen Inject 30 Units into the skin daily. Stop insulin 70/30, keep check in sugars (Patient taking differently: Inject 22 Units into the skin in the morning. )  . Insulin Pen Needle (PEN NEEDLES 5/16") 30G X 8 MM MISC by Does not apply route.  . metFORMIN (GLUCOPHAGE) 500 MG tablet Take 2 tablets (1,000 mg total) by mouth 2 (two) times daily with a meal. Start this with new insulin (Patient taking differently: Take 1,000 mg by mouth 2 (two) times daily with a meal. )  . Multiple Vitamin (MULTIVITAMIN) tablet Take 1 tablet by mouth daily.  . sacubitril-valsartan (ENTRESTO) 49-51 MG Take 1 tablet by mouth 2 (two) times daily.  [name] topiramate (TOPAMAX) 50 MG tablet Take 50 mg by mouth 2 (two) times daily.  . vitamin B-12 (CYANOCOBALAMIN) 1000 MCG tablet Take 1,000 mcg by mouth daily.   No facility-administered encounter medications on file as of 04/03/2020.   Allergies  Allergen Reactions  . Onion     Upset stomach  . Sulfa Antibiotics Other (See Comments)    FLUSH    SDOH Screenings   Alcohol Screen:   . Last Alcohol Screening Score (AUDIT): Not on file  Depression (PHQ2-9): Low Risk   . PHQ-2 Score: 2  Financial Resource Strain:   . Difficulty of Paying Living Expenses: Not on file  Food Insecurity: No Food Insecurity  . Worried About Charity fundraiser in the Last Year: Never  true  . Ran Out of Food in the Last Year: Never true  Housing: Low Risk   . Last Housing Risk Score: 0  Physical Activity: Insufficiently Active  . Days of Exercise per Week: 3 days  . Minutes of Exercise per Session: 30 min  Social Connections:   . Frequency of Communication with Friends and Family: Not on file  . Frequency of Social Gatherings with Friends and Family: Not on file  . Attends Religious Services: Not on file  . Active Member of Clubs or Organizations: Not on file  . Attends Archivist Meetings: Not on file  . Marital Status: Not on file  Stress:   . Feeling of Stress : Not on file  Tobacco Use: Low Risk   . Smoking Tobacco Use: Never Smoker  . Smokeless Tobacco Use: Never Used  Transportation Needs: No Transportation Needs  . Lack of Transportation (Medical): No  . Lack of Transportation (Non-Medical): No     Current Diagnosis/Assessment:  Goals Addressed   None    ,  Hyperlipidemia   LDL goal < 70  Lipid Panel     Component Value Date/Time   CHOL 166 01/17/2020 0826   TRIG 89 01/17/2020 0826   HDL 42 01/17/2020 0826   LDLCALC 107 (H) 01/17/2020 0826    Hepatic Function Latest Ref Rng & Units 01/17/2020 09/13/2019 12/16/2016  Total Protein 6.0 - 8.5 g/dL 6.8 6.1 6.5  Albumin 3.8 - 4.8 g/dL 4.1 4.1 4.1  AST 0 - 40 IU/L 15 15 20   ALT 0 - 44 IU/L 4 5 14   Alk Phosphatase 48 - 121 IU/L 114 132(H) 128(H)  Total Bilirubin 0.0 - 1.2 mg/dL 0.7 0.7 0.8     The 10-year ASCVD risk score Mikey Bussing DC Jr., et al., 2013) is: 23.9%   Values used to calculate the score:     Age: 28 years     Sex: Male     Is Non-Hispanic African American: No     Diabetic: Yes     Tobacco smoker: No     Systolic Blood Pressure: 161 mmHg     Is BP treated: Yes     HDL Cholesterol: 42 mg/dL     Total Cholesterol: 166 mg/dL   Patient has failed these meds in past: Atorvastatin, rosuvastatin Patient is currently uncontrolled on the following medications:  . Aspirin ec  81 mg daily  We discussed:  diet and exercise extensively. Patient has been recommended to begin a statin for cholesterol. Patient's ASCVD risk is elevated and his LDL is above goal. Patient has tried/failed atorvastatin in the past. Pharmacist will encourage statin on follow-up visit.   Update 02/03/2020 - Cardiology began rosuvastatin 10 mg daily. Pharmacist encouraged patient of the benefits of taking statin. Will review lipid, liver and BMP in 3 months. Patient continues to exercise 4 times weekly.   Update 03/01/2020 - Patient tolerating rosuvastatin well.   Update 03/14/2020 - Patient feels that Crestor makes  him feel bad and increasing blood sugar. He isn't resting well. Increased blood sugar as well. Patient has discontinued this medication on his own. He had 5 bypasses 20 years ago. Patient sent a message to Cardiology to discuss elevated blood sugar and not feeling well. Waiting for a response.   Update 03/20/2020 - Patient stopped Crestor on his own last week. He is not interested in changing to pravastatin. Encouraged patient to follow-up with cardiology to determine if non-statin option is appropriate. Counseled patient on the benefits of statin therapy and risk reduction associated. Patient is aware but has not tolerated Lipitor in the past and does not want to resume a statin at this point. Patient plans to follow-up with cardiology.   Plan  Follow-up with cardiology to determine next steps after inability to tolerate 2 different statins.     Diabetes   Recent Relevant Labs: Lab Results  Component Value Date/Time   HGBA1C 7.5 (H) 01/17/2020 08:26 AM   HGBA1C 8.2 (H) 09/13/2019 09:13 AM   MICROALBUR 80 01/17/2020 08:38 AM     Checking BG: 3x per Day  Recent FBG Readings:64-280 Recent pre-meal BG readings: 95-420 Recent 2hr PP BG readings:  125-363 Patient has failed these meds in past: Novolog, novolin 70/30,  Patient is currently uncontrolled on the following  medications:   Lantus solostar 30 units daily  Metformin 500 mg bid with a meal  insuln pen needles   Last diabetic Foot exam: No results found for: HMDIABEYEEXA  Last diabetic Eye exam: No results found for: HMDIABFOOTEX   We discussed: diet and exercise extensively.   Update 03/27/2020:  Recent blood glucose trends:  Breakfast: 68, 78, 90, 168, 128, 81, 159  Lunch: 75, 94, 156, 189, 185, 130, 262 Dinner: 85, 120, 154, 218, 148, 158 Bedtime: 114, 127, 180, 228 (missed dose of metformin), 112, 201   Patient has had low blood sugar in the 50s in the last week. We discussed that patient continues to exercise ~50 minutes 5 times each week. He is limiting his food portions in fear of higher blood sugar readings. Advised patient to be mindful of carbohydrate intake but not continue to be hungry.With results of low blood sugars overnight and higher bedtime readings, pharmacist recommends alter administration time of Lantus to am vs. Pm in an attempt to reduce overnight low blood sugars. Recommended reducing dose to 22 units each morning beginning 03/28/2020. Patient will not give Lantus 03/27/2020 evening dose.   Pharmacist discussed with Colvin Caroli Hix RD, CDE and she approves of plan.   Update 04/03/2020:  Recent blood glucose trends:                Breakfast                   Lunch                         Supper                 HS 09/28 159   262   283  229 09/29   264   251   144  130 09/30 145   285   280  329 10/01 285   298   307  308 10/02 367   350   334  297 10/03 280   297   253  263 10/04 277   308   ---  335 10/05  301   350  Patient transitioned to morning Lantus 09/29. In the transition went 36 hours without insulin as planned. Blood sugars began improving 09/29 at supper as expected. Patient began having pacemaker problems 09/30 and had to have some reprogramming and "stopping his heart" in office. He feels that this has caused an increase in his blood sugar readings. We  have increased his Lantus dose back to 25 units and pharmacist will follow-up in 1 week.   Plan  Recommend increase Lantus to 25 units every morning. Folllow-up with pharmacist in 1 week to assess.     Medication Management   Pt uses [facility] for all medications Uses pill box? Yes Pt endorses excellent compliance  We discussed: Working to avoid doughnut hole in the future and making medications more affordable.   Plan  Continue current medication management strategy    Follow up: 1 week phone visit

## 2020-04-03 ENCOUNTER — Ambulatory Visit: Payer: Medicare Other

## 2020-04-03 DIAGNOSIS — E782 Mixed hyperlipidemia: Secondary | ICD-10-CM

## 2020-04-03 DIAGNOSIS — E1169 Type 2 diabetes mellitus with other specified complication: Secondary | ICD-10-CM

## 2020-04-05 ENCOUNTER — Other Ambulatory Visit: Payer: Self-pay | Admitting: Cardiovascular Disease

## 2020-04-10 ENCOUNTER — Ambulatory Visit: Payer: Medicare Other

## 2020-04-10 ENCOUNTER — Other Ambulatory Visit: Payer: Self-pay

## 2020-04-10 DIAGNOSIS — E1169 Type 2 diabetes mellitus with other specified complication: Secondary | ICD-10-CM

## 2020-04-10 NOTE — Chronic Care Management (AMB) (Signed)
Chronic Care Management Pharmacy  Name: Jeffrey Parsons  MRN: [id number] DOB: Sep 16, 1950   Chief Complaint/ HPI  Jeffrey Parsons,  69 y.o. , male presents for their Follow-Up CCM visit with the clinical pharmacist via telephone due to COVID-19 Pandemic.  PCP : Abigail Miyamoto, MD  Their chronic conditions include: CAD, HTN, mild intermittent asthma, essential tremor, parkinson's disease, HLD, DM.   Office Visits: 01/17/2020 - CBC normal, blood sugar 201, kidney and liver tests ok, LDL cholesterol high 107 consider statin, A1c 7.5 09/13/2019 - CBC normal, glucose 201, kidney and liver tests normal, cholesterol normal, A1c 8.2 high need to increase lantus to 30 units at night.  Consult Visit: 02/21/2020 - Neurology - Parkinson's follow-up visit. Please keep track of when you notice the toe curling problem, and how it relates to the dosing of the medicationI will prescribe Carbidopa-Levodopa 25/250 mg tabs. Please take 1 tab four times daily INSTEAD of the 2 tablets of regular (25/100 mg tabs) carbidopa-Levodopa that you are taking now 01/30/2020 - Cardio- Rosuvastatin 10 mg daily prescribed due to LDL 109. Draw lipid and liver panel in 3 months with PCP.  01/11/2020 - remote pacemaker check.  10/12/2019 - remote pacemaker check.  08/04/2019 - Optometry - Diabetes eye exam.  07/27/2019 - Cardiology Graciela Husbands - Euvolemic - continue current meds. Sinus tachycardia ivabradine is an option but not inclined to prescribe due to Parkinson's- max dose of carvedilol already.  07/27/2019 - Cardiology Nahser  - BP well controlled. Lipid labs checked at next visit. Non angina Medications: Outpatient Encounter Medications as of 04/10/2020  Medication Sig  . acetaminophen (TYLENOL) 500 MG tablet Take 500 mg by mouth daily as needed for moderate pain or headache.   . albuterol (PROAIR HFA) 108 (90 Base) MCG/ACT inhaler Inhale 2 puffs into the lungs every 6 (six) hours as needed for wheezing or  shortness of breath.  [name] aspirin EC 81 MG tablet Take 1 tablet (81 mg total) by mouth daily.  . carbidopa-levodopa (SINEMET CR) 50-200 MG tablet Take 1 tablet by mouth every evening.   . carbidopa-levodopa (SINEMET IR) 25-250 MG tablet Take 1 tablet by mouth 4 (four) times daily.   . carvedilol (COREG) 25 MG tablet Take 1 tablet (25 mg total) by mouth 2 (two) times daily.  . Continuous Blood Gluc Sensor (DEXCOM G6 SENSOR) MISC 1 each by Does not apply route once a week.  . Continuous Blood Gluc Sensor (FREESTYLE LIBRE 2 SENSOR) MISC by Does not apply route.  . insulin glargine (LANTUS SOLOSTAR) 100 UNIT/ML Solostar Pen Inject 30 Units into the skin daily. Stop insulin 70/30, keep check in sugars (Patient taking differently: Inject 22 Units into the skin in the morning. )  . Insulin Pen Needle (PEN NEEDLES 5/16") 30G X 8 MM MISC by Does not apply route.  . metFORMIN (GLUCOPHAGE) 500 MG tablet Take 2 tablets (1,000 mg total) by mouth 2 (two) times daily with a meal. Start this with new insulin (Patient taking differently: Take 1,000 mg by mouth 2 (two) times daily with a meal. )  . Multiple Vitamin (MULTIVITAMIN) tablet Take 1 tablet by mouth daily.  . sacubitril-valsartan (ENTRESTO) 49-51 MG Take 1 tablet by mouth 2 (two) times daily.  [name] topiramate (TOPAMAX) 50 MG tablet Take 50 mg by mouth 2 (two) times daily.  . vitamin B-12 (CYANOCOBALAMIN) 1000 MCG tablet Take 1,000 mcg by mouth daily.   No facility-administered encounter medications on file as of 04/10/2020.   Allergies  Allergen Reactions  . Onion     Upset stomach  . Sulfa Antibiotics Other (See Comments)    FLUSH    SDOH Screenings   Alcohol Screen:   . Last Alcohol Screening Score (AUDIT): Not on file  Depression (PHQ2-9): Low Risk   . PHQ-2 Score: 2  Financial Resource Strain:   . Difficulty of Paying Living Expenses: Not on file  Food Insecurity: No Food Insecurity  . Worried About Programme researcher, broadcasting/film/video in the Last Year:  Never true  . Ran Out of Food in the Last Year: Never true  Housing: Low Risk   . Last Housing Risk Score: 0  Physical Activity: Insufficiently Active  . Days of Exercise per Week: 3 days  . Minutes of Exercise per Session: 30 min  Social Connections:   . Frequency of Communication with Friends and Family: Not on file  . Frequency of Social Gatherings with Friends and Family: Not on file  . Attends Religious Services: Not on file  . Active Member of Clubs or Organizations: Not on file  . Attends Banker Meetings: Not on file  . Marital Status: Not on file  Stress:   . Feeling of Stress : Not on file  Tobacco Use: Low Risk   . Smoking Tobacco Use: Never Smoker  . Smokeless Tobacco Use: Never Used  Transportation Needs: No Transportation Needs  . Lack of Transportation (Medical): No  . Lack of Transportation (Non-Medical): No     Current Diagnosis/Assessment:  Goals Addressed            This Visit's Progress   . Pharmacy Care Plan       CARE PLAN ENTRY (see longitudinal plan of care for additional care plan information)  Current Barriers:  . Chronic Disease Management support, education, and care coordination needs related to Hyperlipidemia, Diabetes, and Heart Failure   Heart Failure BP Readings from Last 3 Encounters:  01/17/20 (!) 116/58  09/13/19 124/80  07/27/19 122/60   . Pharmacist Clinical Goal(s): o Over the next 90 days, patient will work with PharmD and providers to maintain BP goal <130/80 . Current regimen:  o Carvedilol 25 mg twice daily o Entresto 49-51 mg twice daily  . Interventions: o Patient's Entresto arrived via patient assistance (approved through 06/29/2020). Pharmacist will work on Therapist, music for next year once current approval expired.  . Patient self care activities - Over the next 90 days, patient will: o Check BP weekly, document, and provide at future appointments o Ensure daily salt intake < 2300  mg/day  Hyperlipidemia Lab Results  Component Value Date/Time   LDLCALC 107 (H) 01/17/2020 08:26 AM   . Pharmacist Clinical Goal(s): o Over the next 90 days, patient will work with PharmD and providers to achieve LDL goal < 70 . Current regimen:  o Aspirin ec 81 mg daily . Interventions: o Discussed benefits of statin and encouraged to continue taking statin as prescribed from cardiology.  o Keep up the good work with exercise.  o Incorporate lean meats, vegetables, fruits and whole grains in diet.  o Patient has discontinued statin at this time. Will revisit with cardiology. Pharmacist recommended patient consider resuming statin.  . Patient self care activities - Over the next 90 days, patient will: o Contact provider or pharmacist with any questions or concerns.   Diabetes Lab Results  Component Value Date/Time   HGBA1C 7.5 (H) 01/17/2020 08:26 AM   HGBA1C 8.2 (H) 09/13/2019 09:13 AM   .  Pharmacist Clinical Goal(s): o Over the next 90 days, patient will work with PharmD and providers to achieve A1c goal <7% . Current regimen:  o Metformin 1000 mg bid with meals o Lantus 25 units daily in the morning.  . Interventions: o Reviewed home blood sugar log.  o Recommend continue Lantus 25 units daily per recent blood sugar results.  o Discussed recommendation to add SGLT2 to regimen for improved blood sugar control. Patient will consider.  o Patient's blood sugar has increased with pacemaker problems. Working to get closer to goal and will follow-up in 1 week.  . Patient self care activities - Over the next 90 days, patient will: o Check blood sugar 3-4 times daily, document, and provide at future appointments o Contact provider with any episodes of hypoglycemia  Medication management . Pharmacist Clinical Goal(s): o Over the next 90 days, patient will work with PharmD and providers to maintain optimal medication adherence . [facility] . Interventions o Comprehensive medication review performed. o Continue current medication management strategy . Patient self care activities - Over the next 90 days, patient will: o Focus on medication adherence by using a pill box o Take medications as prescribed o Report any questions or concerns to PharmD and/or provider(s)  Please see past updates related to this goal by clicking on the "Past Updates" button in the selected goal       ,   Diabetes   Recent Relevant Labs: Lab Results  Component Value Date/Time   HGBA1C 7.5 (H) 01/17/2020 08:26 AM   HGBA1C 8.2 (H) 09/13/2019 09:13 AM   MICROALBUR 80 01/17/2020 08:38 AM     Checking BG: 3x per Day  Recent FBG Readings:64-280 Recent pre-meal BG readings: 95-420 Recent 2hr PP BG readings:  125-363 Patient has failed these meds in past: Novolog, novolin 70/30,  Patient is currently uncontrolled on the following medications:   Lantus solostar 30 units daily  Metformin 500 mg bid with a meal  insuln pen needles   Last diabetic Foot exam: No results found for: HMDIABEYEEXA  Last diabetic Eye exam: No results found for: HMDIABFOOTEX   We discussed: diet and exercise extensively.   Patient's Recent Blood Sugar Readings:     B L S HS 10/6  255 227 176 154 10/7  208 265 206 157 10/8  161 251 238 262 10/9  262 325 383 364   10/10  376 360  387 10/11  372 382 327 279 10/12  226  Patient began taking insulin in the morning 10/06. He forgot his insulin dose in the morning on 10/09 but took midday. Patient denies recent low blood sugar readings. Endorses consistent diet and regular exercise. States that he hasn't felt well but can't pinpoint exactly what is going on. Pharmacist discussed option of SGLT2 in addition to insulin and metformin. Patient will think about it but really wants to avoid an additional medication. Pharmacist will continue to monitor patient's blood sugars and consult with Genelle Hix from [employer] as well.   Plan  Recommend continue Lantus to 25 units every morning. Patient to consider adding Farxiga to current regimen for improved blood sugar control. Folllow-up with pharmacist in 1 week to assess.     Medication Management   [facility] Uses pill box? Yes Pt endorses excellent compliance  We discussed: Working to avoid doughnut hole in the future and making medications more affordable.   Plan  Continue current  medication management strategy    Follow up: 1 week phone visit

## 2020-04-11 ENCOUNTER — Ambulatory Visit (INDEPENDENT_AMBULATORY_CARE_PROVIDER_SITE_OTHER): Payer: Medicare Other

## 2020-04-11 DIAGNOSIS — I495 Sick sinus syndrome: Secondary | ICD-10-CM | POA: Diagnosis not present

## 2020-04-13 LAB — CUP PACEART REMOTE DEVICE CHECK
Battery Remaining Longevity: 80 mo
Battery Voltage: 2.97 V
Brady Statistic AP VP Percent: 0.11 %
Brady Statistic AP VS Percent: 0.01 %
Brady Statistic AS VP Percent: 99.04 %
Brady Statistic AS VS Percent: 0.84 %
Brady Statistic RA Percent Paced: 0.12 %
Brady Statistic RV Percent Paced: 98.96 %
Date Time Interrogation Session: 20211013042304
HighPow Impedance: 61 Ohm
Implantable Lead Implant Date: 20111213
Implantable Lead Implant Date: 20111213
Implantable Lead Implant Date: 20191230
Implantable Lead Implant Date: 20191230
Implantable Lead Location: 753858
Implantable Lead Location: 753859
Implantable Lead Location: 753860
Implantable Lead Location: 753860
Implantable Lead Model: 5076
Implantable Lead Model: 5076
Implantable Pulse Generator Implant Date: 20191230
Lead Channel Impedance Value: 1102 Ohm
Lead Channel Impedance Value: 1121 Ohm
Lead Channel Impedance Value: 1159 Ohm
Lead Channel Impedance Value: 231.878
Lead Channel Impedance Value: 235.98 Ohm
Lead Channel Impedance Value: 277.46 Ohm
Lead Channel Impedance Value: 299.394
Lead Channel Impedance Value: 306.269
Lead Channel Impedance Value: 380 Ohm
Lead Channel Impedance Value: 380 Ohm
Lead Channel Impedance Value: 437 Ohm
Lead Channel Impedance Value: 456 Ohm
Lead Channel Impedance Value: 494 Ohm
Lead Channel Impedance Value: 513 Ohm
Lead Channel Impedance Value: 760 Ohm
Lead Channel Impedance Value: 760 Ohm
Lead Channel Impedance Value: 836 Ohm
Lead Channel Impedance Value: 836 Ohm
Lead Channel Pacing Threshold Amplitude: 0.5 V
Lead Channel Pacing Threshold Amplitude: 0.875 V
Lead Channel Pacing Threshold Amplitude: 1.375 V
Lead Channel Pacing Threshold Pulse Width: 0.4 ms
Lead Channel Pacing Threshold Pulse Width: 0.4 ms
Lead Channel Pacing Threshold Pulse Width: 0.4 ms
Lead Channel Sensing Intrinsic Amplitude: 3.125 mV
Lead Channel Sensing Intrinsic Amplitude: 3.125 mV
Lead Channel Sensing Intrinsic Amplitude: 3.625 mV
Lead Channel Sensing Intrinsic Amplitude: 3.625 mV
Lead Channel Setting Pacing Amplitude: 1.75 V
Lead Channel Setting Pacing Amplitude: 2 V
Lead Channel Setting Pacing Amplitude: 2 V
Lead Channel Setting Pacing Pulse Width: 0.4 ms
Lead Channel Setting Pacing Pulse Width: 0.4 ms
Lead Channel Setting Sensing Sensitivity: 0.45 mV

## 2020-04-17 NOTE — Progress Notes (Signed)
Remote ICD transmission.   

## 2020-04-20 NOTE — Patient Instructions (Addendum)
Visit Information  Goals Addressed            This Visit's Progress   . Pharmacy Care Plan       CARE PLAN ENTRY (see longitudinal plan of care for additional care plan information)  Current Barriers:  . Chronic Disease Management support, education, and care coordination needs related to Hyperlipidemia, Diabetes, and Heart Failure   Heart Failure BP Readings from Last 3 Encounters:  01/17/20 (!) 116/58  09/13/19 124/80  07/27/19 122/60   . Pharmacist Clinical Goal(s): o Over the next 90 days, patient will work with PharmD and providers to maintain BP goal <130/80 . Current regimen:  o Carvedilol 25 mg twice daily o Entresto 49-51 mg twice daily  . Interventions: o Patient's Entresto arrived via patient assistance (approved through 06/29/2020). Pharmacist will work on Therapist, music for next year once current approval expired.  . Patient self care activities - Over the next 90 days, patient will: o Check BP weekly, document, and provide at future appointments o Ensure daily salt intake < 2300 mg/day  Hyperlipidemia Lab Results  Component Value Date/Time   LDLCALC 107 (H) 01/17/2020 08:26 AM   . Pharmacist Clinical Goal(s): o Over the next 90 days, patient will work with PharmD and providers to achieve LDL goal < 70 . Current regimen:  o Aspirin ec 81 mg daily . Interventions: o Discussed benefits of statin and encouraged to continue taking statin as prescribed from cardiology.  o Keep up the good work with exercise.  o Incorporate lean meats, vegetables, fruits and whole grains in diet.  o Patient has discontinued statin at this time. Will revisit with cardiology. Pharmacist recommended patient consider resuming statin.  . Patient self care activities - Over the next 90 days, patient will: o Contact provider or pharmacist with any questions or concerns.   Diabetes Lab Results  Component Value Date/Time   HGBA1C 7.5 (H) 01/17/2020 08:26 AM   HGBA1C 8.2 (H)  09/13/2019 09:13 AM   . Pharmacist Clinical Goal(s): o Over the next 90 days, patient will work with PharmD and providers to achieve A1c goal <7% . Current regimen:  o Metformin 1000 mg bid with meals o Lantus 25 units daily in the morning.  . Interventions: o Reviewed home blood sugar log.  o Recommend continue Lantus 25 units daily per recent blood sugar results.  o Discussed recommendation to add SGLT2 to regimen for improved blood sugar control. Patient will consider.  o Patient's blood sugar has increased with pacemaker problems. Working to get closer to goal and will follow-up in 1 week.  . Patient self care activities - Over the next 90 days, patient will: o Check blood sugar 3-4 times daily, document, and provide at future appointments o Contact provider with any episodes of hypoglycemia  Medication management . Pharmacist Clinical Goal(s): o Over the next 90 days, patient will work with PharmD and providers to maintain optimal medication adherence . [facility] . Interventions o Comprehensive medication review performed. o Continue current medication management strategy . Patient self care activities - Over the next 90 days, patient will: o Focus on medication adherence by using a pill box o Take medications as prescribed o Report any questions or concerns to PharmD and/or provider(s)  Please see past updates related to this goal by clicking on the "Past Updates" button in the selected goal        Patient verbalizes understanding of instructions provided today.   Telephone follow up  appointment with pharmacy team member scheduled for: 1 week  Juliane Lack, PharmD, [facility] Clinical Pharmacist [facility] [phone] (office) [phone] (mobile)

## 2020-04-24 ENCOUNTER — Telehealth: Payer: Self-pay

## 2020-04-24 ENCOUNTER — Telehealth: Payer: Medicare Other

## 2020-04-24 NOTE — Chronic Care Management (AMB) (Signed)
Patient called with recent blood sugar readings.   Patient's blood sugar continues to fluctuate. Patient reports 1 reading of 60 mg/dL overnight in the past week that was corrected with peanut butter crackers and stable/at goal the next morning.   During the week with regular exercise his blood sugar readings remain under 200 mg/dL. On weekends patient reports blood sugars are in the 200s.   Pharmacist discussed medication options with patient. Patient does not want to add any additional oral agents at this time. Discussed the benefit of SGLT2 inhibitor but patient declines at this time.   Patient would be willing to consider changing Lantus to [place] for potentially longer effect for basal insulin. Patient is also willing to consider a meal time insulin rapid insulin if above goal.. Patient is very insulin sensitive and has a history of frequent hypoglycemia.   Recommend a trial of Tresiba 25 units daily. Patient has a visit in office with Dr. Marina Goodell 04/25/2020.   Thank you,   Juliane Lack, PharmD, [facility] Clinical Pharmacist [facility] [phone] (office) [phone] (mobile)

## 2020-04-25 ENCOUNTER — Encounter: Payer: Self-pay | Admitting: Legal Medicine

## 2020-04-25 ENCOUNTER — Other Ambulatory Visit: Payer: Self-pay

## 2020-04-25 ENCOUNTER — Ambulatory Visit (INDEPENDENT_AMBULATORY_CARE_PROVIDER_SITE_OTHER): Payer: Medicare Other | Admitting: Legal Medicine

## 2020-04-25 VITALS — BP 122/68 | HR 81 | Temp 97.4°F | Resp 16 | Ht 68.0 in | Wt 157.0 lb

## 2020-04-25 DIAGNOSIS — G20 Parkinson's disease: Secondary | ICD-10-CM

## 2020-04-25 NOTE — Progress Notes (Signed)
Subjective:  Patient ID: Jeffrey Parsons, male    DOB: [birth year]  Age: 69 y.o. MRN: [id number]  Chief Complaint  Patient presents with  . Hypertension    pt just wanted to be talk about getting a walker    HPI: patient ha difficulty waking with his parkinson disease.  He has not fallen.  He has tried cane but is not able to walk far.  He is stable on his medicines.   Current Outpatient Medications on File Prior to Visit  Medication Sig Dispense Refill  . acetaminophen (TYLENOL) 500 MG tablet Take 500 mg by mouth daily as needed for moderate pain or headache.     . albuterol (PROAIR HFA) 108 (90 Base) MCG/ACT inhaler Inhale 2 puffs into the lungs every 6 (six) hours as needed for wheezing or shortness of breath.    [name] aspirin EC 81 MG tablet Take 1 tablet (81 mg total) by mouth daily.    . carbidopa-levodopa (SINEMET CR) 50-200 MG tablet Take 1 tablet by mouth every evening.     . carbidopa-levodopa (SINEMET IR) 25-250 MG tablet Take 1 tablet by mouth 4 (four) times daily.     . carvedilol (COREG) 25 MG tablet Take 1 tablet (25 mg total) by mouth 2 (two) times daily. 180 tablet 3  . Continuous Blood Gluc Sensor (DEXCOM G6 SENSOR) MISC 1 each by Does not apply route once a week. 9 each 4  . Continuous Blood Gluc Sensor (FREESTYLE LIBRE 2 SENSOR) MISC by Does not apply route.    . insulin glargine (LANTUS SOLOSTAR) 100 UNIT/ML Solostar Pen Inject 30 Units into the skin daily. Stop insulin 70/30, keep check in sugars (Patient taking differently: Inject 22 Units into the skin in the morning. ) 15 mL 11  . Insulin Pen Needle (PEN NEEDLES 5/16") 30G X 8 MM MISC by Does not apply route.    . metFORMIN (GLUCOPHAGE) 500 MG tablet Take 2 tablets (1,000 mg total) by mouth 2 (two) times daily with a meal. Start this with new insulin (Patient taking differently: Take 1,000 mg by mouth 2 (two) times daily with a meal. ) 360 tablet 2  . Multiple Vitamin (MULTIVITAMIN) tablet Take 1 tablet by mouth daily.     . sacubitril-valsartan (ENTRESTO) 49-51 MG Take 1 tablet by mouth 2 (two) times daily. 180 tablet 3  . topiramate (TOPAMAX) 50 MG tablet Take 50 mg by mouth 2 (two) times daily.    . vitamin B-12 (CYANOCOBALAMIN) 1000 MCG tablet Take 1,000 mcg by mouth daily.     No current facility-administered medications on file prior to visit.   Past Medical History:  Diagnosis Date  . AICD (automatic cardioverter/defibrillator) present   . Asthma   . Chronic systolic CHF (congestive heart failure) (HCC) 05/26/2017   Echo 11/19: EF 15-20, diff HK, Gr 3 DD, mod MR, severe LAE, mod RV dilation, mod reduced RVSF, mild TR, mild PI  . Complete heart block (HCC) 05/26/2017  . Coronary artery disease involving native coronary artery of native heart without angina pectoris 09/12/2016  . Hypertension   . Ischemic cardiomyopathy 05/26/2017  . Pacemaker 05/26/2017  . Pneumonia 2019  . Retinopathy    Past Surgical History:  Procedure Laterality Date  . BI-VENTRICULAR IMPLANTABLE CARDIOVERTER DEFIBRILLATOR UPGRADE  06/25/2018  . BIV UPGRADE N/A 06/25/2018   Procedure: BIV ICD UPGRADE;  Surgeon: Duke Salvia, MD;  Location: [facility] INVASIVE CV LAB;  Service: Cardiovascular;  Laterality: N/A;  . CARDIAC CATHETERIZATION  2001; ~ 2013  . CATARACT EXTRACTION W/ INTRAOCULAR LENS  IMPLANT, BILATERAL Bilateral   . CORONARY ARTERY BYPASS GRAFT  2001   "CABG X5"  . INSERT / REPLACE / REMOVE PACEMAKER  05/2010  . KNEE SURGERY Left    AS A CHILD  . RIGHT/LEFT HEART CATH AND CORONARY/GRAFT ANGIOGRAPHY N/A 06/16/2017   Procedure: RIGHT/LEFT HEART CATH AND CORONARY/GRAFT ANGIOGRAPHY;  Surgeon: Tonny Bollman, MD;  Location: [facility] INVASIVE CV LAB;  Service: Cardiovascular;  Laterality: N/A;  . TONSILLECTOMY      Family History  Problem Relation Age of Onset  . Bronchitis Mother   . Coronary artery disease Father   . Heart disease Father   . Hypertension Sister   . Hypertension Brother    Social History    Socioeconomic History  . Marital status: Married    Spouse name: Not on file  . Number of children: Not on file  . Years of education: Not on file  . Highest education level: Not on file  Occupational History  . Not on file  Tobacco Use  . Smoking status: Never Smoker  . Smokeless tobacco: Never Used  Vaping Use  . Vaping Use: Never used  Substance and Sexual Activity  . Alcohol use: Never  . Drug use: Never  . Sexual activity: Yes  Other Topics Concern  . Not on file  Social History Narrative  . Not on file   Social Determinants of Health   Financial Resource Strain:   . Difficulty of Paying Living Expenses: Not on file  Food Insecurity: No Food Insecurity  . Worried About Programme researcher, broadcasting/film/video in the Last Year: Never true  . Ran Out of Food in the Last Year: Never true  Transportation Needs: No Transportation Needs  . Lack of Transportation (Medical): No  . Lack of Transportation (Non-Medical): No  Physical Activity: Insufficiently Active  . Days of Exercise per Week: 3 days  . Minutes of Exercise per Session: 30 min  Stress:   . Feeling of Stress : Not on file  Social Connections:   . Frequency of Communication with Friends and Family: Not on file  . Frequency of Social Gatherings with Friends and Family: Not on file  . Attends Religious Services: Not on file  . Active Member of Clubs or Organizations: Not on file  . Attends Banker Meetings: Not on file  . Marital Status: Not on file    Review of Systems  Constitutional: Negative for activity change and appetite change.  HENT: Negative.   Respiratory: Negative for choking and stridor.   Cardiovascular: Negative for chest pain, palpitations and leg swelling.  Gastrointestinal: Negative.   Genitourinary: Negative.   Musculoskeletal: Positive for myalgias.  Skin: Negative.   Neurological: Positive for weakness.       Walking difficulty due to Parkinson disease.  Psychiatric/Behavioral:  Negative.      Objective:  BP 122/68   Pulse 81   Temp (!) 97.4 F (36.3 C)   Resp 16   Ht 5\' 8"  (1.727 m)   Wt 157 lb (71.2 kg)   SpO2 100%   BMI 23.87 kg/m   BP/Weight 04/25/2020 04/02/2020 03/29/2020  Systolic BP 122 102 150  Diastolic BP 68 54 62  Wt. (Lbs) 157 157.4 154.98  BMI 23.87 23.93 23.57    Physical Exam Vitals reviewed.  Constitutional:      Appearance: Normal appearance.  HENT:     Nose: Nose normal.  Mouth/Throat:     Mouth: Mucous membranes are moist.  Eyes:     Extraocular Movements: Extraocular movements intact.     Conjunctiva/sclera: Conjunctivae normal.     Pupils: Pupils are equal, round, and reactive to light.  Cardiovascular:     Rate and Rhythm: Regular rhythm.     Pulses: Normal pulses.     Heart sounds: Normal heart sounds.  Pulmonary:     Effort: Pulmonary effort is normal.     Breath sounds: Normal breath sounds.  Abdominal:     General: Abdomen is flat.  Neurological:     Mental Status: He is alert.     Comments: Hating gait with small steps and shuffling.  He needs support. Cogwheel rigidity arm and legs       Lab Results  Component Value Date   WBC 7.0 03/29/2020   HGB 12.8 (L) 03/29/2020   HCT 38.1 (L) 03/29/2020   PLT 176 03/29/2020   GLUCOSE 351 (H) 03/29/2020   CHOL 166 01/17/2020   TRIG 89 01/17/2020   HDL 42 01/17/2020   LDLCALC 107 (H) 01/17/2020   ALT 4 01/17/2020   AST 15 01/17/2020   NA 135 03/29/2020   K 4.5 03/29/2020   CL 105 03/29/2020   CREATININE 1.03 03/29/2020   BUN 32 (H) 03/29/2020   CO2 18 (L) 03/29/2020   HGBA1C 7.5 (H) 01/17/2020   MICROALBUR 80 01/17/2020      Assessment & Plan:   1. Parkinson's disease (HCC) - For home use only DME 4 wheeled rolling walker with seat ([id number]) Patient requires Rolator with wheels and seat to improve his mobility and to maintain his present and future function.  The cane does not help but he can walk and Rolator is more suitable than standard  walker for movement.   Rolator is necessary to prevent further deteroration of this patient.  Orders Placed This Encounter  Procedures  . For home use only DME 4 wheeled rolling walker with seat ([id number])     Follow-up: Return in about 3 months (around 07/26/2020).  An After Visit Summary was printed and given to the patient.  [name] [facility] [phone]

## 2020-05-03 ENCOUNTER — Telehealth: Payer: Medicare Other

## 2020-05-03 ENCOUNTER — Other Ambulatory Visit: Payer: Medicare Other

## 2020-05-03 DIAGNOSIS — I2510 Atherosclerotic heart disease of native coronary artery without angina pectoris: Secondary | ICD-10-CM | POA: Diagnosis not present

## 2020-05-04 LAB — BASIC METABOLIC PANEL
BUN/Creatinine Ratio: 22 (ref 10–24)
BUN: 22 mg/dL (ref 8–27)
CO2: 22 mmol/L (ref 20–29)
Calcium: 8.8 mg/dL (ref 8.6–10.2)
Chloride: 102 mmol/L (ref 96–106)
Creatinine, Ser: 1.01 mg/dL (ref 0.76–1.27)
GFR calc Af Amer: 87 mL/min/{1.73_m2} (ref >59)
GFR calc non Af Amer: 76 mL/min/{1.73_m2} (ref >59)
Glucose: 358 mg/dL — ABNORMAL HIGH (ref 65–99)
Potassium: 5 mmol/L (ref 3.5–5.2)
Sodium: 137 mmol/L (ref 134–144)

## 2020-05-04 LAB — LIPID PANEL
Chol/HDL Ratio: 4.1 ratio (ref 0.0–5.0)
Cholesterol, Total: 182 mg/dL (ref 100–199)
HDL: 44 mg/dL (ref >39)
LDL Chol Calc (NIH): 121 mg/dL — ABNORMAL HIGH (ref 0–99)
Triglycerides: 93 mg/dL (ref 0–149)
VLDL Cholesterol Cal: 17 mg/dL (ref 5–40)

## 2020-05-04 LAB — HEPATIC FUNCTION PANEL
ALT: 2 IU/L (ref 0–44)
AST: 14 IU/L (ref 0–40)
Albumin: 4.4 g/dL (ref 3.8–4.8)
Alkaline Phosphatase: 121 IU/L (ref 44–121)
Bilirubin Total: 1 mg/dL (ref 0.0–1.2)
Bilirubin, Direct: 0.23 mg/dL (ref 0.00–0.40)
Total Protein: 6.5 g/dL (ref 6.0–8.5)

## 2020-05-04 LAB — CARDIOVASCULAR RISK ASSESSMENT

## 2020-05-04 NOTE — Progress Notes (Signed)
LDL 121 recommend atorvastatin to lower, liver tests normal, glucose 258 very high, kidney tests normal, I recommend starting ozempic to lower sugar.  Add A1c to lab lp

## 2020-05-07 ENCOUNTER — Other Ambulatory Visit: Payer: Self-pay

## 2020-05-07 DIAGNOSIS — R7309 Other abnormal glucose: Secondary | ICD-10-CM | POA: Diagnosis not present

## 2020-05-07 LAB — HEMOGLOBIN A1C
Est. average glucose Bld gHb Est-mCnc: 200 mg/dL
Hgb A1c MFr Bld: 8.6 % — ABNORMAL HIGH (ref 4.8–5.6)

## 2020-05-08 ENCOUNTER — Other Ambulatory Visit: Payer: Self-pay

## 2020-05-08 ENCOUNTER — Ambulatory Visit: Payer: Medicare Other

## 2020-05-08 DIAGNOSIS — E782 Mixed hyperlipidemia: Secondary | ICD-10-CM

## 2020-05-08 DIAGNOSIS — I10 Essential (primary) hypertension: Secondary | ICD-10-CM

## 2020-05-08 DIAGNOSIS — E1169 Type 2 diabetes mellitus with other specified complication: Secondary | ICD-10-CM

## 2020-05-08 NOTE — Patient Instructions (Signed)
Visit Information  Goals Addressed            This Visit's Progress   . Pharmacy Care Plan       CARE PLAN ENTRY (see longitudinal plan of care for additional care plan information)  Current Barriers:  . Chronic Disease Management support, education, and care coordination needs related to Hyperlipidemia, Diabetes, and Heart Failure   Heart Failure BP Readings from Last 3 Encounters:  04/25/20 122/68  04/02/20 (!) 102/54  03/29/20 (!) 150/62   . Pharmacist Clinical Goal(s): o Over the next 90 days, patient will work with PharmD and providers to maintain BP goal <130/80 . Current regimen:  o Carvedilol 25 mg twice daily o Entresto 49-51 mg twice daily  . Interventions: o Patient will complete Entresto application 11/23 when in office for appointment.  o Patient ordered Entresto refill through PAP and is supposed to arrive 11/09. Pharmacist encouraged patient to contact pharmacist if does not arrive in time. Pharmacist can coordinate samples.  . Patient self care activities - Over the next 90 days, patient will: o Check BP weekly, document, and provide at future appointments o Ensure daily salt intake < 2300 mg/day  Hyperlipidemia Lab Results  Component Value Date/Time   LDLCALC 121 (H) 05/03/2020 10:04 AM   . Pharmacist Clinical Goal(s): o Over the next 90 days, patient will work with PharmD and providers to achieve LDL goal < 70 . Current regimen:  o Aspirin ec 81 mg daily . Interventions: o Discussed benefits of statin and encouraged to continue taking statin as prescribed from cardiology.  o Keep up the good work with exercise.  o Incorporate lean meats, vegetables, fruits and whole grains in diet.  o Patient has discontinued statin at this time. Will revisit with cardiology. Pharmacist recommended patient consider resuming statin.  . Patient self care activities - Over the next 90 days, patient will: o Contact provider or pharmacist with any questions or concerns.    Diabetes Lab Results  Component Value Date/Time   HGBA1C 8.6 (H) 05/07/2020 10:38 AM   HGBA1C 7.5 (H) 01/17/2020 08:26 AM   . Pharmacist Clinical Goal(s): o Over the next 90 days, patient will work with PharmD and providers to achieve A1c goal <7% . Current regimen:  o Metformin 1000 mg bid with meals o Lantus 27 units daily in the morning.  . Interventions: o Reviewed home blood sugar log.  o Recommend continue Lantus 27 units daily per recent blood sugar results.  o Discussed recommendation to add SGLT2 to regimen for improved blood sugar control. Patient declined at this time.  o Patient's blood sugar increases on weekends due to not going to the gym.  o Pharmacist coordinating patient assistance renewal for 2022.   [name] Patient self care activities - Over the next 90 days, patient will: o Check blood sugar 3-4 times daily, document, and provide at future appointments o Contact provider with any episodes of hypoglycemia  Medication management . Pharmacist Clinical Goal(s): o Over the next 90 days, patient will work with PharmD and providers to maintain optimal medication adherence . Current pharmacy: [facility] . Interventions o Comprehensive medication review performed. o Continue current medication management strategy . Patient self care activities - Over the next 90 days, patient will: o Focus on medication adherence by using a pill box o Take medications as prescribed o Report any questions or concerns to PharmD and/or provider(s)  Please see past updates related to this goal by clicking on the "Past  Updates" button in the selected goal        Patient verbalizes understanding of instructions provided today.   Telephone follow up appointment with pharmacy team member scheduled for: 05/2020  Juliane Lack, PharmD, [facility] Clinical Pharmacist [facility] [phone] (office) [phone] (mobile)

## 2020-05-08 NOTE — Chronic Care Management (AMB) (Signed)
Chronic Care Management Pharmacy  Name: Jeffrey Parsons  MRN: [id number] DOB: April 27, 1951   Chief Complaint/ HPI  Jeffrey Parsons,  69 y.o. , male presents for their Follow-Up CCM visit with the clinical pharmacist via telephone due to COVID-19 Pandemic.  PCP : Abigail Miyamoto, MD  Their chronic conditions include: CAD, HTN, mild intermittent asthma, essential tremor, parkinson's disease, HLD, DM.   Office Visits: 04/25/2020 - rollator for parkinson's disease Consult Visit: None reported Medications: Outpatient Encounter Medications as of 05/08/2020  Medication Sig  . acetaminophen (TYLENOL) 500 MG tablet Take 500 mg by mouth daily as needed for moderate pain or headache.   . albuterol (PROAIR HFA) 108 (90 Base) MCG/ACT inhaler Inhale 2 puffs into the lungs every 6 (six) hours as needed for wheezing or shortness of breath.  [name] aspirin EC 81 MG tablet Take 1 tablet (81 mg total) by mouth daily.  . carbidopa-levodopa (SINEMET CR) 50-200 MG tablet Take 1 tablet by mouth every evening.   . carbidopa-levodopa (SINEMET IR) 25-250 MG tablet Take 1 tablet by mouth 4 (four) times daily.   . carvedilol (COREG) 25 MG tablet Take 1 tablet (25 mg total) by mouth 2 (two) times daily.  . Continuous Blood Gluc Sensor (DEXCOM G6 SENSOR) MISC 1 each by Does not apply route once a week.  . Continuous Blood Gluc Sensor (FREESTYLE LIBRE 2 SENSOR) MISC by Does not apply route.  . insulin glargine (LANTUS SOLOSTAR) 100 UNIT/ML Solostar Pen Inject 30 Units into the skin daily. Stop insulin 70/30, keep check in sugars (Patient taking differently: Inject 22 Units into the skin in the morning. )  . Insulin Pen Needle (PEN NEEDLES 5/16") 30G X 8 MM MISC by Does not apply route.  . metFORMIN (GLUCOPHAGE) 500 MG tablet Take 2 tablets (1,000 mg total) by mouth 2 (two) times daily with a meal. Start this with new insulin (Patient taking differently: Take 1,000 mg by mouth 2 (two) times daily with a meal. )    . Multiple Vitamin (MULTIVITAMIN) tablet Take 1 tablet by mouth daily.  . sacubitril-valsartan (ENTRESTO) 49-51 MG Take 1 tablet by mouth 2 (two) times daily.  [name] topiramate (TOPAMAX) 50 MG tablet Take 50 mg by mouth 2 (two) times daily.  . vitamin B-12 (CYANOCOBALAMIN) 1000 MCG tablet Take 1,000 mcg by mouth daily.   No facility-administered encounter medications on file as of 05/08/2020.   Allergies  Allergen Reactions  . Onion     Upset stomach  . Sulfa Antibiotics Other (See Comments)    FLUSH    SDOH Screenings   Alcohol Screen:   . Last Alcohol Screening Score (AUDIT): Not on file  Depression (PHQ2-9): Low Risk   . PHQ-2 Score: 2  Financial Resource Strain:   . Difficulty of Paying Living Expenses: Not on file  Food Insecurity: No Food Insecurity  . Worried About Programme researcher, broadcasting/film/video in the Last Year: Never true  . Ran Out of Food in the Last Year: Never true  Housing: Low Risk   . Last Housing Risk Score: 0  Physical Activity: Insufficiently Active  . Days of Exercise per Week: 3 days  . Minutes of Exercise per Session: 30 min  Social Connections:   . Frequency of Communication with Friends and Family: Not on file  . Frequency of Social Gatherings with Friends and Family: Not on file  . Attends Religious Services: Not on file  . Active Member of Clubs or Organizations: Not on file  .  Attends Banker Meetings: Not on file  . Marital Status: Not on file  Stress:   . Feeling of Stress : Not on file  Tobacco Use: Low Risk   . Smoking Tobacco Use: Never Smoker  . Smokeless Tobacco Use: Never Used  Transportation Needs: No Transportation Needs  . Lack of Transportation (Medical): No  . Lack of Transportation (Non-Medical): No     Current Diagnosis/Assessment:  Goals Addressed            This Visit's Progress   . Pharmacy Care Plan       CARE PLAN ENTRY (see longitudinal plan of care for additional care plan information)  Current Barriers:   . Chronic Disease Management support, education, and care coordination needs related to Hyperlipidemia, Diabetes, and Heart Failure   Heart Failure BP Readings from Last 3 Encounters:  04/25/20 122/68  04/02/20 (!) 102/54  03/29/20 (!) 150/62   . Pharmacist Clinical Goal(s): o Over the next 90 days, patient will work with PharmD and providers to maintain BP goal <130/80 . Current regimen:  o Carvedilol 25 mg twice daily o Entresto 49-51 mg twice daily  . Interventions: o Patient will complete Entresto application 11/23 when in office for appointment.  o Patient ordered Entresto refill through PAP and is supposed to arrive 11/09. Pharmacist encouraged patient to contact pharmacist if does not arrive in time. Pharmacist can coordinate samples.  . Patient self care activities - Over the next 90 days, patient will: o Check BP weekly, document, and provide at future appointments o Ensure daily salt intake < 2300 mg/day  Hyperlipidemia Lab Results  Component Value Date/Time   LDLCALC 121 (H) 05/03/2020 10:04 AM   . Pharmacist Clinical Goal(s): o Over the next 90 days, patient will work with PharmD and providers to achieve LDL goal < 70 . Current regimen:  o Aspirin ec 81 mg daily . Interventions: o Discussed benefits of statin and encouraged to continue taking statin as prescribed from cardiology.  o Keep up the good work with exercise.  o Incorporate lean meats, vegetables, fruits and whole grains in diet.  o Patient has discontinued statin at this time. Will revisit with cardiology. Pharmacist recommended patient consider resuming statin.  . Patient self care activities - Over the next 90 days, patient will: o Contact provider or pharmacist with any questions or concerns.   Diabetes Lab Results  Component Value Date/Time   HGBA1C 8.6 (H) 05/07/2020 10:38 AM   HGBA1C 7.5 (H) 01/17/2020 08:26 AM   . Pharmacist Clinical Goal(s): o Over the next 90 days, patient will work with  PharmD and providers to achieve A1c goal <7% . Current regimen:  o Metformin 1000 mg bid with meals o Lantus 27 units daily in the morning.  . Interventions: o Reviewed home blood sugar log.  o Recommend continue Lantus 27 units daily per recent blood sugar results.  o Discussed recommendation to add SGLT2 to regimen for improved blood sugar control. Patient declined at this time.  o Patient's blood sugar increases on weekends due to not going to the gym.  o Pharmacist coordinating patient assistance renewal for 2022.   [name] Patient self care activities - Over the next 90 days, patient will: o Check blood sugar 3-4 times daily, document, and provide at future appointments o Contact provider with any episodes of hypoglycemia  Medication management . Pharmacist Clinical Goal(s): o Over the next 90 days, patient will work with PharmD and providers to maintain optimal  medication adherence . [facility] . Interventions o Comprehensive medication review performed. o Continue current medication management strategy . Patient self care activities - Over the next 90 days, patient will: o Focus on medication adherence by using a pill box o Take medications as prescribed o Report any questions or concerns to PharmD and/or provider(s)  Please see past updates related to this goal by clicking on the "Past Updates" button in the selected goal       ,   Diabetes   Recent Relevant Labs: Lab Results  Component Value Date/Time   HGBA1C 8.6 (H) 05/07/2020 10:38 AM   HGBA1C 7.5 (H) 01/17/2020 08:26 AM   MICROALBUR 80 01/17/2020 08:38 AM     Checking BG: 3x per Day  Recent FBG Readings:64-280 Recent pre-meal BG readings: 95-420 Recent 2hr PP BG readings:  125-363 Patient has failed these meds in past: Novolog, novolin 70/30,  Patient is currently uncontrolled on the following medications:   Lantus solostar 30 units daily  Metformin 1000 mg bid with a meal  insuln pen  needles   Last diabetic Foot exam: 03/2020  Last diabetic Eye exam: 03/2020  We discussed: diet and exercise extensively.   Patient's Recent Blood Sugar Readings:     B L S HS  05/05/20 318 292 191 124 05/06/20 300s 300s 300s 300s 05/07/20 335 204 165 158 05/08/20     132 123  Patient has increased lantus to 27 units daily. Patient declines [place]. Patient can tell that Saturday and Sunday numbers are higher due to not exercising. Patient typically does treadmill and bicycle at [employer] Monday through Friday.  Encouraged patient to continue exercising each day for mobility and improved blood sugar numbers. Patient reports trying to balance carbohydrates with meals and snacks. Patient denies recent low blood sugar readings and has increased Lantus to 27 units each morning. Discussed the option of mealtime insulin with patient in the past. Will revisit at next appointment in 1 month if numbers continue to remain elevated.   Plan  Recommend continue Lantus to 27 units every morning.      Medication Management   [facility] Uses pill box? Yes Pt endorses excellent compliance  We discussed: Working to avoid doughnut hole in the future and making medications more affordable.   Plan  Continue current medication management strategy    Follow up: 1 month phone visit

## 2020-05-08 NOTE — Progress Notes (Signed)
A1cc 8.6 high, need < 8.0 lp

## 2020-05-09 ENCOUNTER — Telehealth: Payer: Self-pay

## 2020-05-09 NOTE — Chronic Care Management (AMB) (Signed)
    Chronic Care Management Pharmacy Assistant   Name: Jeffrey Parsons  MRN: [id number] DOB: 1951/04/14  Reason for Encounter: Medication Review    PCP : Abigail Miyamoto, MD  Allergies:   Allergies  Allergen Reactions  . Onion     Upset stomach  . Sulfa Antibiotics Other (See Comments)    FLUSH     Medications: Outpatient Encounter Medications as of 05/09/2020  Medication Sig  . acetaminophen (TYLENOL) 500 MG tablet Take 500 mg by mouth daily as needed for moderate pain or headache.   . albuterol (PROAIR HFA) 108 (90 Base) MCG/ACT inhaler Inhale 2 puffs into the lungs every 6 (six) hours as needed for wheezing or shortness of breath.  [name] aspirin EC 81 MG tablet Take 1 tablet (81 mg total) by mouth daily.  . carbidopa-levodopa (SINEMET CR) 50-200 MG tablet Take 1 tablet by mouth every evening.   . carbidopa-levodopa (SINEMET IR) 25-250 MG tablet Take 1 tablet by mouth 4 (four) times daily.   . carvedilol (COREG) 25 MG tablet Take 1 tablet (25 mg total) by mouth 2 (two) times daily.  . Continuous Blood Gluc Sensor (DEXCOM G6 SENSOR) MISC 1 each by Does not apply route once a week.  . Continuous Blood Gluc Sensor (FREESTYLE LIBRE 2 SENSOR) MISC by Does not apply route.  . insulin glargine (LANTUS SOLOSTAR) 100 UNIT/ML Solostar Pen Inject 30 Units into the skin daily. Stop insulin 70/30, keep check in sugars (Patient taking differently: Inject 27 Units into the skin in the morning. )  . Insulin Pen Needle (PEN NEEDLES 5/16") 30G X 8 MM MISC by Does not apply route.  . metFORMIN (GLUCOPHAGE) 500 MG tablet Take 2 tablets (1,000 mg total) by mouth 2 (two) times daily with a meal. Start this with new insulin (Patient taking differently: Take 1,000 mg by mouth 2 (two) times daily with a meal. )  . Multiple Vitamin (MULTIVITAMIN) tablet Take 1 tablet by mouth daily.  . sacubitril-valsartan (ENTRESTO) 49-51 MG Take 1 tablet by mouth 2 (two) times daily.  [name] topiramate (TOPAMAX) 50 MG  tablet Take 50 mg by mouth 2 (two) times daily.  . vitamin B-12 (CYANOCOBALAMIN) 1000 MCG tablet Take 1,000 mcg by mouth daily.   No facility-administered encounter medications on file as of 05/09/2020.    Current Diagnosis: Patient Active Problem List   Diagnosis Date Noted  . BMI 24.0-24.9, adult 01/17/2020  . Sick sinus syndrome (HCC) 09/13/2019  . Obesity, diabetes, and hypertension syndrome (HCC) 09/13/2019  . Secondary dystonia 07/04/2019  . Essential tremor 10/12/2018  . Essential hypertension 06/26/2018  . Complete heart block (HCC) 05/26/2017  . Cardiac device in situ 05/26/2017  . Chronic combined systolic and diastolic CHF (congestive heart failure) (HCC) 05/26/2017  . Ischemic cardiomyopathy 05/26/2017  . Allergy to sulfa drugs 12/04/2016  . S/P CABG x 5 09/12/2016  . Mixed hyperlipidemia 09/12/2016  . Coronary artery disease involving native coronary artery of native heart without angina pectoris 09/12/2016  . Pacemaker 06/06/2016  . Long term (current) use of aspirin 12/06/2015  . Mild obesity 12/06/2015  . Other long term (current) drug therapy 12/06/2015  . Mild intermittent asthma with acute exacerbation 12/07/2014  . Parkinson's disease (HCC) 12/07/2014     Follow-Up:  Patient Assistance Coordination - New Applications filled out for Entresto and Lantus . Awaiting for providers signature to fax.  Faxed.    Levada Dy Brown,CPP.Notified  Jon Gills, [facility] Clinical Pharmacist Assistant [phone]

## 2020-05-11 ENCOUNTER — Telehealth: Payer: Self-pay

## 2020-05-11 NOTE — Chronic Care Management (AMB) (Signed)
Patient called today to discuss recent blood sugar changes. Patient has been administering 24 units of Lantus daily. Blood sugars are now at or below goal after several weeks of being elevated.    B  L  D  HS 11/09  132  123  94  82 11/10  72  81  105  143 11/11  94  130  80  101  Discussed lifestyle and diet changes. Patient denies changes to reduce blood sugar readings. Recommend reduce dose to 23 units of Lantus daily. Will contact pharmacist if blood sugar continues to be below goal or low.   Patient has an appointment with Dr. Marina Goodell in 2 weeks. Patient is concerned about persistent weight loss. He states he has limited appetite and continues to lose weight. He would like to discuss this at upcoming visit. Patient states that his current weight at home is 148 lbs. Patient exercises 5 days each week currently.   Juliane Lack, PharmD, [facility] Clinical Pharmacist [facility] [phone] (office) [phone] (mobile)

## 2020-05-18 ENCOUNTER — Telehealth: Payer: Self-pay

## 2020-05-18 NOTE — Progress Notes (Signed)
Chronic Care Management Pharmacy Assistant   Name: Jeffrey Parsons  MRN: [id number] DOB: [birth year]  Reason for Encounter: Medication Review   PCP : Abigail Miyamoto, MD  Allergies:   Allergies  Allergen Reactions  . Onion     Upset stomach  . Sulfa Antibiotics Other (See Comments)    FLUSH     Medications: Outpatient Encounter Medications as of 05/18/2020  Medication Sig  . acetaminophen (TYLENOL) 500 MG tablet Take 500 mg by mouth daily as needed for moderate pain or headache.   . albuterol (PROAIR HFA) 108 (90 Base) MCG/ACT inhaler Inhale 2 puffs into the lungs every 6 (six) hours as needed for wheezing or shortness of breath.  [name] aspirin EC 81 MG tablet Take 1 tablet (81 mg total) by mouth daily.  . carbidopa-levodopa (SINEMET CR) 50-200 MG tablet Take 1 tablet by mouth every evening.   . carbidopa-levodopa (SINEMET IR) 25-250 MG tablet Take 1 tablet by mouth 4 (four) times daily.   . carvedilol (COREG) 25 MG tablet Take 1 tablet (25 mg total) by mouth 2 (two) times daily.  . Continuous Blood Gluc Sensor (DEXCOM G6 SENSOR) MISC 1 each by Does not apply route once a week.  . Continuous Blood Gluc Sensor (FREESTYLE LIBRE 2 SENSOR) MISC by Does not apply route.  . insulin glargine (LANTUS SOLOSTAR) 100 UNIT/ML Solostar Pen Inject 30 Units into the skin daily. Stop insulin 70/30, keep check in sugars (Patient taking differently: Inject 27 Units into the skin in the morning. )  . Insulin Pen Needle (PEN NEEDLES 5/16") 30G X 8 MM MISC by Does not apply route.  . metFORMIN (GLUCOPHAGE) 500 MG tablet Take 2 tablets (1,000 mg total) by mouth 2 (two) times daily with a meal. Start this with new insulin (Patient taking differently: Take 1,000 mg by mouth 2 (two) times daily with a meal. )  . Multiple Vitamin (MULTIVITAMIN) tablet Take 1 tablet by mouth daily.  . sacubitril-valsartan (ENTRESTO) 49-51 MG Take 1 tablet by mouth 2 (two) times daily.  [name] topiramate (TOPAMAX) 50 MG  tablet Take 50 mg by mouth 2 (two) times daily.  . vitamin B-12 (CYANOCOBALAMIN) 1000 MCG tablet Take 1,000 mcg by mouth daily.   No facility-administered encounter medications on file as of 05/18/2020.    Current Diagnosis: Patient Active Problem List   Diagnosis Date Noted  . BMI 24.0-24.9, adult 01/17/2020  . Sick sinus syndrome (HCC) 09/13/2019  . Obesity, diabetes, and hypertension syndrome (HCC) 09/13/2019  . Secondary dystonia 07/04/2019  . Essential tremor 10/12/2018  . Essential hypertension 06/26/2018  . Complete heart block (HCC) 05/26/2017  . Cardiac device in situ 05/26/2017  . Chronic combined systolic and diastolic CHF (congestive heart failure) (HCC) 05/26/2017  . Ischemic cardiomyopathy 05/26/2017  . Allergy to sulfa drugs 12/04/2016  . S/P CABG x 5 09/12/2016  . Mixed hyperlipidemia 09/12/2016  . Coronary artery disease involving native coronary artery of native heart without angina pectoris 09/12/2016  . Pacemaker 06/06/2016  . Long term (current) use of aspirin 12/06/2015  . Mild obesity 12/06/2015  . Other long term (current) drug therapy 12/06/2015  . Mild intermittent asthma with acute exacerbation 12/07/2014  . Parkinson's disease (HCC) 12/07/2014      Follow-Up:  Patient Assistance Coordination - Reviewed chart and adherence measures. Per [employer], total gaps - all measures are (2) . Total gaps -star measures are (0). 1. Wellness Bundle not performed 2. Annual Wellness not performed   Huntley Dec  Gennette Pac, CPP. Notified  Jon Gills, [facility] Clinical Pharmacist Assistant [phone]

## 2020-05-22 ENCOUNTER — Encounter: Payer: Self-pay | Admitting: Legal Medicine

## 2020-05-22 ENCOUNTER — Ambulatory Visit: Payer: Self-pay

## 2020-05-22 ENCOUNTER — Other Ambulatory Visit: Payer: Self-pay

## 2020-05-22 ENCOUNTER — Ambulatory Visit (INDEPENDENT_AMBULATORY_CARE_PROVIDER_SITE_OTHER): Payer: Medicare Other | Admitting: Legal Medicine

## 2020-05-22 VITALS — BP 124/56 | HR 104 | Temp 95.8°F | Resp 16 | Ht 68.0 in | Wt 152.2 lb

## 2020-05-22 DIAGNOSIS — E538 Deficiency of other specified B group vitamins: Secondary | ICD-10-CM

## 2020-05-22 DIAGNOSIS — N401 Enlarged prostate with lower urinary tract symptoms: Secondary | ICD-10-CM

## 2020-05-22 DIAGNOSIS — I10 Essential (primary) hypertension: Secondary | ICD-10-CM

## 2020-05-22 DIAGNOSIS — E782 Mixed hyperlipidemia: Secondary | ICD-10-CM | POA: Diagnosis not present

## 2020-05-22 DIAGNOSIS — I152 Hypertension secondary to endocrine disorders: Secondary | ICD-10-CM

## 2020-05-22 DIAGNOSIS — E559 Vitamin D deficiency, unspecified: Secondary | ICD-10-CM

## 2020-05-22 DIAGNOSIS — E1169 Type 2 diabetes mellitus with other specified complication: Secondary | ICD-10-CM

## 2020-05-22 DIAGNOSIS — E1159 Type 2 diabetes mellitus with other circulatory complications: Secondary | ICD-10-CM

## 2020-05-22 DIAGNOSIS — E119 Type 2 diabetes mellitus without complications: Secondary | ICD-10-CM

## 2020-05-22 DIAGNOSIS — E669 Obesity, unspecified: Secondary | ICD-10-CM

## 2020-05-22 DIAGNOSIS — I2510 Atherosclerotic heart disease of native coronary artery without angina pectoris: Secondary | ICD-10-CM | POA: Diagnosis not present

## 2020-05-22 DIAGNOSIS — N138 Other obstructive and reflux uropathy: Secondary | ICD-10-CM | POA: Diagnosis not present

## 2020-05-22 DIAGNOSIS — G20 Parkinson's disease: Secondary | ICD-10-CM

## 2020-05-22 DIAGNOSIS — R5383 Other fatigue: Secondary | ICD-10-CM | POA: Diagnosis not present

## 2020-05-22 DIAGNOSIS — I5042 Chronic combined systolic (congestive) and diastolic (congestive) heart failure: Secondary | ICD-10-CM

## 2020-05-22 DIAGNOSIS — G20A1 Parkinson's disease without dyskinesia, without mention of fluctuations: Secondary | ICD-10-CM

## 2020-05-22 DIAGNOSIS — R531 Weakness: Secondary | ICD-10-CM | POA: Insufficient documentation

## 2020-05-22 MED ORDER — METFORMIN HCL 500 MG PO TABS: 1000.0000 mg | ORAL_TABLET | Freq: Two times a day (BID) | ORAL | 2 refills | Status: DC

## 2020-05-22 NOTE — Progress Notes (Signed)
Subjective:  Patient ID: Jeffrey Parsons, male    DOB: 06-18-1951  Age: 69 y.o. MRN: [id number]  Chief Complaint  Patient presents with  . Hypertension  . Hyperlipidemia    HPIL chronic visit  Patient present with type 2 diabetes.  Specifically, this is type 2, insulin requiring diabetes, complicated by retinopathy.  Compliance with treatment has been good; patient take medicines as directed, maintains diet and exercise regimen, follows up as directed, and is keeping glucose diary.  Date of  diagnosis 2010.  Depression screen has been performed.Tobacco screen nonsmoker. Current medicines for diabetes lantus 27 units qd, metformin.  Patient is on valsartan for renal protection and none patient stopped for cholesterol control.  Patient performs foot exams daily and last ophthalmologic exam was yes.  Patient presents for follow up of hypertension.  Patient tolerating valsartan, corge well with side effects.  Patient was diagnosed with hypertension 2010 so has been treated for hypertension for 10 years.Patient is working on maintaining diet and exercise regimen and follows up as directed. Complication include none.  Patient presents with hyperlipidemia.  Compliance with treatment has been good; patient takes medicines as directed, maintains low cholesterol diet, follows up as directed, and maintains exercise regimen.  Patient is using patient stopped without problems.   Current Outpatient Medications on File Prior to Visit  Medication Sig Dispense Refill  . acetaminophen (TYLENOL) 500 MG tablet Take 500 mg by mouth daily as needed for moderate pain or headache.     . albuterol (PROAIR HFA) 108 (90 Base) MCG/ACT inhaler Inhale 2 puffs into the lungs every 6 (six) hours as needed for wheezing or shortness of breath.    [name]. aspirin EC 81 MG tablet Take 1 tablet (81 mg total) by mouth daily.    . carbidopa-levodopa (SINEMET CR) 50-200 MG tablet Take 1 tablet by mouth every evening.     .  carbidopa-levodopa (SINEMET IR) 25-250 MG tablet Take 1 tablet by mouth 4 (four) times daily.     . carvedilol (COREG) 25 MG tablet Take 1 tablet (25 mg total) by mouth 2 (two) times daily. 180 tablet 3  . Continuous Blood Gluc Sensor (DEXCOM G6 SENSOR) MISC 1 each by Does not apply route once a week. 9 each 4  . Continuous Blood Gluc Sensor (FREESTYLE LIBRE 2 SENSOR) MISC by Does not apply route.    . insulin glargine (LANTUS SOLOSTAR) 100 UNIT/ML Solostar Pen Inject 30 Units into the skin daily. Stop insulin 70/30, keep check in sugars (Patient taking differently: Inject 27 Units into the skin in the morning. ) 15 mL 11  . Insulin Pen Needle (PEN NEEDLES 5/16") 30G X 8 MM MISC by Does not apply route.    . Multiple Vitamin (MULTIVITAMIN) tablet Take 1 tablet by mouth daily.    . sacubitril-valsartan (ENTRESTO) 49-51 MG Take 1 tablet by mouth 2 (two) times daily. 180 tablet 3  . topiramate (TOPAMAX) 50 MG tablet Take 50 mg by mouth 2 (two) times daily.    . vitamin B-12 (CYANOCOBALAMIN) 1000 MCG tablet Take 1,000 mcg by mouth daily.     No current facility-administered medications on file prior to visit.   Past Medical History:  Diagnosis Date  . AICD (automatic cardioverter/defibrillator) present   . Complete heart block (HCC) 05/26/2017  . Coronary artery disease involving native coronary artery of native heart without angina pectoris 09/12/2016  . Ischemic cardiomyopathy 05/26/2017  . Pneumonia 2019  . Retinopathy    Past Surgical History:  Procedure Laterality Date  . BI-VENTRICULAR IMPLANTABLE CARDIOVERTER DEFIBRILLATOR UPGRADE  06/25/2018  . BIV UPGRADE N/A 06/25/2018   Procedure: BIV ICD UPGRADE;  Surgeon: Duke Salvia, MD;  Location: [facility] INVASIVE CV LAB;  Service: Cardiovascular;  Laterality: N/A;  . CARDIAC CATHETERIZATION  2001; ~ 2013  . CATARACT EXTRACTION W/ INTRAOCULAR LENS  IMPLANT, BILATERAL Bilateral   . CORONARY ARTERY BYPASS GRAFT  2001   "CABG X5"  . INSERT /  REPLACE / REMOVE PACEMAKER  05/2010  . KNEE SURGERY Left    AS A CHILD  . RIGHT/LEFT HEART CATH AND CORONARY/GRAFT ANGIOGRAPHY N/A 06/16/2017   Procedure: RIGHT/LEFT HEART CATH AND CORONARY/GRAFT ANGIOGRAPHY;  Surgeon: Tonny Bollman, MD;  Location: [facility] INVASIVE CV LAB;  Service: Cardiovascular;  Laterality: N/A;  . TONSILLECTOMY      Family History  Problem Relation Age of Onset  . Bronchitis Mother   . Coronary artery disease Father   . Heart disease Father   . Hypertension Sister   . Hypertension Brother    Social History   Socioeconomic History  . Marital status: Married    Spouse name: Not on file  . Number of children: Not on file  . Years of education: Not on file  . Highest education level: Not on file  Occupational History  . Not on file  Tobacco Use  . Smoking status: Never Smoker  . Smokeless tobacco: Never Used  Vaping Use  . Vaping Use: Never used  Substance and Sexual Activity  . Alcohol use: Never  . Drug use: Never  . Sexual activity: Yes  Other Topics Concern  . Not on file  Social History Narrative  . Not on file   Social Determinants of Health   Financial Resource Strain:   . Difficulty of Paying Living Expenses: Not on file  Food Insecurity: No Food Insecurity  . Worried About Programme researcher, broadcasting/film/video in the Last Year: Never true  . Ran Out of Food in the Last Year: Never true  Transportation Needs: No Transportation Needs  . Lack of Transportation (Medical): No  . Lack of Transportation (Non-Medical): No  Physical Activity: Insufficiently Active  . Days of Exercise per Week: 3 days  . Minutes of Exercise per Session: 30 min  Stress:   . Feeling of Stress : Not on file  Social Connections: Socially Integrated  . Frequency of Communication with Friends and Family: More than three times a week  . Frequency of Social Gatherings with Friends and Family: Twice a week  . Attends Religious Services: More than 4 times per year  . Active Member of Clubs  or Organizations: Yes  . Attends Banker Meetings: More than 4 times per year  . Marital Status: Married    Review of Systems  Constitutional: Positive for fatigue (from CHF). Negative for activity change and appetite change.  HENT: Negative for congestion, sinus pain and tinnitus.   Eyes: Negative for visual disturbance.  Respiratory: Negative.   Cardiovascular: Positive for chest pain. Negative for palpitations and leg swelling.  Gastrointestinal: Negative for abdominal distention and abdominal pain.  Endocrine: Positive for polyuria.  Genitourinary: Negative.   Skin: Negative.   Neurological: Negative.   Psychiatric/Behavioral: Negative.      Objective:  BP (!) 124/56 (BP Location: Left Arm, Patient Position: Sitting, Cuff Size: Normal)   Pulse (!) 104   Temp (!) 95.8 F (35.4 C) (Temporal)   Resp 16   Ht 5\' 8"  (1.727 m)  Wt 152 lb 3.2 oz (69 kg)   SpO2 94%   BMI 23.14 kg/m   BP/Weight 05/22/2020 04/25/2020 04/02/2020  Systolic BP 124 122 102  Diastolic BP 56 68 54  Wt. (Lbs) 152.2 157 157.4  BMI 23.14 23.87 23.93    Physical Exam Vitals reviewed.  Constitutional:      Appearance: Normal appearance.  HENT:     Head: Normocephalic and atraumatic.     Right Ear: Tympanic membrane normal.     Left Ear: Tympanic membrane normal.     Nose: Nose normal.     Mouth/Throat:     Mouth: Mucous membranes are dry.  Eyes:     Extraocular Movements: Extraocular movements intact.     Conjunctiva/sclera: Conjunctivae normal.     Pupils: Pupils are equal, round, and reactive to light.  Cardiovascular:     Rate and Rhythm: Normal rate and regular rhythm.     Pulses: Normal pulses.     Heart sounds: Normal heart sounds.  Pulmonary:     Effort: Pulmonary effort is normal.     Breath sounds: Normal breath sounds.  Abdominal:     General: Abdomen is flat. Bowel sounds are normal.     Palpations: Abdomen is soft.  Musculoskeletal:        General: Normal range  of motion.     Cervical back: Normal range of motion and neck supple.     Comments: Loss of muscle mass  Skin:    General: Skin is warm and dry.     Capillary Refill: Capillary refill takes less than 2 seconds.  Neurological:     General: No focal deficit present.     Mental Status: He is alert and oriented to person, place, and time. Mental status is at baseline.     Comments: coqwheel rigidy, mask like facies, shuffling gait, resting tremor     Diabetic Foot Exam - Simple   Simple Foot Form Diabetic Foot exam was performed with the following findings: Yes 05/22/2020  8:35 AM  Visual Inspection No deformities, no ulcerations, no other skin breakdown bilaterally: Yes Sensation Testing Intact to touch and monofilament testing bilaterally: Yes Pulse Check Posterior Tibialis and Dorsalis pulse intact bilaterally: Yes Comments      Lab Results  Component Value Date   WBC 7.0 03/29/2020   HGB 12.8 (L) 03/29/2020   HCT 38.1 (L) 03/29/2020   PLT 176 03/29/2020   GLUCOSE 358 (H) 05/03/2020   CHOL 182 05/03/2020   TRIG 93 05/03/2020   HDL 44 05/03/2020   LDLCALC 121 (H) 05/03/2020   ALT 2 05/03/2020   AST 14 05/03/2020   NA 137 05/03/2020   K 5.0 05/03/2020   CL 102 05/03/2020   CREATININE 1.01 05/03/2020   BUN 22 05/03/2020   CO2 22 05/03/2020   TSH 1.440 05/22/2020   HGBA1C 8.6 (H) 05/07/2020   MICROALBUR 80 01/17/2020      Assessment & Plan:   1. Essential hypertension An individual hypertension care plan was established and reinforced today.  The patient's status was assessed using clinical findings on exam and labs or diagnostic tests. The patient's success at meeting treatment goals on disease specific evidence-based guidelines and found to be well controlled. SELF MANAGEMENT: The patient and I together assessed ways to personally work towards obtaining the recommended goals. RECOMMENDATIONS: avoid decongestants found in common cold remedies, decrease consumption  of alcohol, perform routine monitoring of BP with home BP cuff, exercise, reduction of dietary salt,  take medicines as prescribed, try not to miss doses and quit smoking.  Regular exercise and maintaining a healthy weight is needed.  Stress reduction may help. A CLINICAL SUMMARY including written plan identify barriers to care unique to individual due to social or financial issues.  We attempt to mutually creat solutions for individual and family understanding.  2. Mixed hyperlipidemia AN INDIVIDUAL CARE PLAN for hyperlipidemia/ cholesterol was established and reinforced today.  The patient's status was assessed using clinical findings on exam, lab and other diagnostic tests. The patient's disease status was assessed based on evidence-based guidelines and found to be well controlled. MEDICATIONS were reviewed. SELF MANAGEMENT GOALS have been discussed and patient's success at attaining the goal of low cholesterol was assessed. RECOMMENDATION given include regular exercise 3 days a week and low cholesterol/low fat diet. CLINICAL SUMMARY including written plan to identify barriers unique to the patient due to social or economic  reasons was discussed.  3. Type 2 diabetes mellitus with other specified complication, unspecified whether long term insulin use (HCC) An individual care plan for diabetes was established and reinforced today.  The patient's status was assessed using clinical findings on exam, labs and diagnostic testing. Patient success at meeting goals based on disease specific evidence-based guidelines and found to be good controlled. Medications were assessed and patient's understanding of the medical issues , including barriers were assessed. Recommend adherence to a diabetic diet, a graduated exercise program, HgbA1c level is checked quarterly, and urine microalbumin performed yearly .  Annual mono-filament sensation testing performed. Lower blood pressure and control hyperlipidemia is  important. Get annual eye exams and annual flu shots and smoking cessation discussed.  Self management goals were discussed.  4. Parkinson's disease (HCC) Patient has chronic parkinsons disease and is on medicines and stable.  5. Chronic combined systolic and diastolic CHF (congestive heart failure) (HCC) An individualized care plan was established and reinforced.  The patient's disease status was assessed using clinical finding son exam today, labs, and/or other diagnostic testing such as x-rays, to determine the patient's success in meeting treatmentgoalsbased on disease-based guidelines and found to beimproving. But not at goal yet. Medications prescriptions change Laboratory tests ordered to be performed today include routine. RECOMMENDATIONS: given include cardiology.  Call physician is patient gains 3 lbs in one day or 5 lbs for one week.  Call for progressive PND, orthopnea or increased pedal edema.  6. Coronary artery disease involving native coronary artery of native heart without angina pectoris An individual plan was formulated based on patient history and exam, labs and evidence based data. Patient has not had recent angina or nitroglycerin use. continue present treatment.  7. Obesity, diabetes, and hypertension syndrome (HCC) - metFORMIN (GLUCOPHAGE) 500 MG tablet; Take 2 tablets (1,000 mg total) by mouth 2 (two) times daily with a meal. Start this with new insulin  Dispense: 360 tablet; Refill: 2 An individual care plan for diabetes was established and reinforced today.  The patient's status was assessed using clinical findings on exam, labs and diagnostic testing. Patient success at meeting goals based on disease specific evidence-based guidelines and found to be good controlled. Medications were assessed and patient's understanding of the medical issues , including barriers were assessed. Recommend adherence to a diabetic diet, a graduated exercise program, HgbA1c level is checked  quarterly, and urine microalbumin performed yearly .  Annual mono-filament sensation testing performed. Lower blood pressure and control hyperlipidemia is important. Get annual eye exams and annual flu shots and smoking cessation discussed.  Self management goals were discussed.    Meds ordered this encounter  Medications  . metFORMIN (GLUCOPHAGE) 500 MG tablet    Sig: Take 2 tablets (1,000 mg total) by mouth 2 (two) times daily with a meal. Start this with new insulin    Dispense:  360 tablet    Refill:  2        I spent 30  minutes dedicated to the care of this patient on the date of this encounter to include face-to-face time with the patient, as well as: reviewing old records and lengthly discussion with patient  Follow-up: Return in about 3 months (around 08/22/2020) for fasting.  An After Visit Summary was printed and given to the patient.  [name], MD [facility] [phone]

## 2020-05-22 NOTE — Patient Instructions (Signed)
Take nutritional supplement bid like glucerna

## 2020-05-22 NOTE — Chronic Care Management (AMB) (Signed)
Coordinating renewal for patient assistance programs.   Patient completed application in office for renewal [employer]. Pharmacist coordinating provider section and signature.   Pharmacist faxing application for Lantus and Entresto renewal for 2022.   Juliane Lack, PharmD, [facility] Clinical Pharmacist [facility] [phone] (office) [phone] (mobile)

## 2020-05-23 ENCOUNTER — Encounter: Payer: Self-pay | Admitting: Legal Medicine

## 2020-05-23 LAB — PSA: Prostate Specific Ag, Serum: 0.9 ng/mL (ref 0.0–4.0)

## 2020-05-23 LAB — VITAMIN B12: Vitamin B-12: >2000 pg/mL — ABNORMAL HIGH (ref 232–1245)

## 2020-05-23 LAB — THYROID PANEL WITH TSH
Free Thyroxine Index: 2 (ref 1.2–4.9)
T3 Uptake Ratio: 31 % (ref 24–39)
T4, Total: 6.3 ug/dL (ref 4.5–12.0)
TSH: 1.44 u[IU]/mL (ref 0.450–4.500)

## 2020-05-23 NOTE — Progress Notes (Signed)
Thyroid tests all normal, B12 high at >20000,  lp

## 2020-05-30 HISTORY — PX: OTHER SURGICAL HISTORY: SHX169

## 2020-06-05 ENCOUNTER — Ambulatory Visit: Payer: Medicare Other

## 2020-06-05 ENCOUNTER — Other Ambulatory Visit: Payer: Self-pay

## 2020-06-05 DIAGNOSIS — E1169 Type 2 diabetes mellitus with other specified complication: Secondary | ICD-10-CM

## 2020-06-05 NOTE — Chronic Care Management (AMB) (Signed)
Chronic Care Management Pharmacy  Name: Jeffrey Parsons  MRN: [id number] DOB: March 26, 1951   Chief Complaint/ HPI  Jeffrey Parsons,  69 y.o. , male presents for their Follow-Up CCM visit with the clinical pharmacist via telephone due to COVID-19 Pandemic.  PCP : Abigail Miyamoto, MD  Their chronic conditions include: CAD, HTN, mild intermittent asthma, essential tremor, parkinson's disease, HLD, DM.   Office Visits: 05/22/2020 - thyroid tests all normal, b12 high at >20000. Recommend Glucerna bid.  Consult Visit: None  Medications: Outpatient Encounter Medications as of 06/05/2020  Medication Sig   acetaminophen (TYLENOL) 500 MG tablet Take 500 mg by mouth daily as needed for moderate pain or headache.    albuterol (PROAIR HFA) 108 (90 Base) MCG/ACT inhaler Inhale 2 puffs into the lungs every 6 (six) hours as needed for wheezing or shortness of breath.   aspirin EC 81 MG tablet Take 1 tablet (81 mg total) by mouth daily.   carbidopa-levodopa (SINEMET CR) 50-200 MG tablet Take 1 tablet by mouth every evening.    carbidopa-levodopa (SINEMET IR) 25-250 MG tablet Take 1 tablet by mouth 4 (four) times daily.    carvedilol (COREG) 25 MG tablet Take 1 tablet (25 mg total) by mouth 2 (two) times daily.   Continuous Blood Gluc Sensor (DEXCOM G6 SENSOR) MISC 1 each by Does not apply route once a week.   Continuous Blood Gluc Sensor (FREESTYLE LIBRE 2 SENSOR) MISC by Does not apply route.   insulin glargine (LANTUS SOLOSTAR) 100 UNIT/ML Solostar Pen Inject 30 Units into the skin daily. Stop insulin 70/30, keep check in sugars (Patient taking differently: Inject 27 Units into the skin in the morning. )   Insulin Pen Needle (PEN NEEDLES 5/16") 30G X 8 MM MISC by Does not apply route.   metFORMIN (GLUCOPHAGE) 500 MG tablet Take 2 tablets (1,000 mg total) by mouth 2 (two) times daily with a meal. Start this with new insulin   Multiple Vitamin (MULTIVITAMIN) tablet Take 1 tablet by  mouth daily.   sacubitril-valsartan (ENTRESTO) 49-51 MG Take 1 tablet by mouth 2 (two) times daily.   topiramate (TOPAMAX) 50 MG tablet Take 50 mg by mouth 2 (two) times daily.   vitamin B-12 (CYANOCOBALAMIN) 1000 MCG tablet Take 1,000 mcg by mouth daily.   No facility-administered encounter medications on file as of 06/05/2020.   Allergies  Allergen Reactions   Onion     Upset stomach   Sulfa Antibiotics Other (See Comments)    FLUSH    SDOH Screenings   Alcohol Screen:    Last Alcohol Screening Score (AUDIT): Not on file  Depression (PHQ2-9): Low Risk    PHQ-2 Score: 2  Financial Resource Strain:    Difficulty of Paying Living Expenses: Not on file  Food Insecurity: No Food Insecurity   Worried About Running Out of Food in the Last Year: Never true   Ran Out of Food in the Last Year: Never true  Housing: Low Risk    Last Housing Risk Score: 0  Physical Activity: Insufficiently Active   Days of Exercise per Week: 3 days   Minutes of Exercise per Session: 30 min  Social Connections: Socially Integrated   Frequency of Communication with Friends and Family: More than three times a week   Frequency of Social Gatherings with Friends and Family: Twice a week   Attends Religious Services: More than 4 times per year   Active Member of [employer] or Organizations: Yes   Attends Club or  Organization Meetings: More than 4 times per year   Marital Status: Married  Stress:    Feeling of Stress : Not on file  Tobacco Use: Low Risk    Smoking Tobacco Use: Never Smoker   Smokeless Tobacco Use: Never Used  Transportation Needs: No Transportation Needs   Lack of Transportation (Medical): No   Lack of Transportation (Non-Medical): No     Current Diagnosis/Assessment:  Goals Addressed            This Visit's Progress    Pharmacy Care Plan       CARE PLAN ENTRY (see longitudinal plan of care for additional care plan information)  Current Barriers:    Chronic Disease Management support, education, and care coordination needs related to Hyperlipidemia, Diabetes, and Heart Failure   Heart Failure BP Readings from Last 3 Encounters:  04/25/20 122/68  04/02/20 (!) 102/54  03/29/20 (!) 150/62    Pharmacist Clinical Goal(s): o Over the next 90 days, patient will work with PharmD and providers to maintain BP goal <130/80  Current regimen:  o Carvedilol 25 mg twice daily o Entresto 49-51 mg twice daily   Interventions: o Patient will complete Entresto application 11/23 when in office for appointment.  o Patient ordered Entresto refill through PAP and is supposed to arrive 11/09. Pharmacist encouraged patient to contact pharmacist if does not arrive in time. Pharmacist can coordinate samples.   Patient self care activities - Over the next 90 days, patient will: o Check BP weekly, document, and provide at future appointments o Ensure daily salt intake < 2300 mg/day  Hyperlipidemia Lab Results  Component Value Date/Time   LDLCALC 121 (H) 05/03/2020 10:04 AM    Pharmacist Clinical Goal(s): o Over the next 90 days, patient will work with PharmD and providers to achieve LDL goal < 70  Current regimen:  o Aspirin ec 81 mg daily  Interventions: o Discussed benefits of statin and encouraged to continue taking statin as prescribed from cardiology.  o Keep up the good work with exercise.  o Incorporate lean meats, vegetables, fruits and whole grains in diet.  o Patient has discontinued statin at this time. Will revisit with cardiology. Pharmacist recommended patient consider resuming statin.   Patient self care activities - Over the next 90 days, patient will: o Contact provider or pharmacist with any questions or concerns.   Diabetes Lab Results  Component Value Date/Time   HGBA1C 8.6 (H) 05/07/2020 10:38 AM   HGBA1C 7.5 (H) 01/17/2020 08:26 AM    Pharmacist Clinical Goal(s): o Over the next 90 days, patient will work with  PharmD and providers to achieve A1c goal <7%  Current regimen:  o Metformin 1000 mg bid with meals o Lantus 26 units daily in the morning.   Interventions: o Reviewed home blood sugar log.  o Recommend continue Lantus 26 units daily per recent blood sugar results.  o Pharmacist coordinating patient assistance renewal for 2022.    Patient self care activities - Over the next 90 days, patient will: o Check blood sugar 3-4 times daily, document, and provide at future appointments o Contact provider with any episodes of hypoglycemia  Medication management  Pharmacist Clinical Goal(s): o Over the next 90 days, patient will work with PharmD and providers to maintain optimal medication adherence  Current pharmacy: [facility]  Interventions o Comprehensive medication review performed. o Continue current medication management strategy  Patient self care activities - Over the next 90 days, patient will: o Focus on  medication adherence by using a pill box o Take medications as prescribed o Report any questions or concerns to PharmD and/or provider(s)  Please see past updates related to this goal by clicking on the "Past Updates" button in the selected goal       ,   Diabetes   Recent Relevant Labs: Lab Results  Component Value Date/Time   HGBA1C 8.6 (H) 05/07/2020 10:38 AM   HGBA1C 7.5 (H) 01/17/2020 08:26 AM   MICROALBUR 80 01/17/2020 08:38 AM     Checking BG: 3x per Day  Patient has failed these meds in past: Novolog, novolin 70/30,  Patient is currently uncontrolled on the following medications:   Lantus solostar 26 units daily  Metformin 500 mg bid with a meal  insuln pen needles   Last diabetic Foot exam: No results found for: HMDIABEYEEXA  Last diabetic Eye exam: No results found for: HMDIABFOOTEX   We discussed: diet and exercise extensively.   Patient's Recent Blood Sugar Readings:     B L S HS 12/07  168 133 128 12/06  199 136 114 116 (went  low) 12/05  144 217 136 129 (went low) 12/04  100 191 229 150 12/03  156 238 --- 174 12/02  120 180 --- 212 12/01  120 114 146 165  Drinking boost for afternoon snack almost daily. Having a snack some nights of peanut butter and crackers. Reports blood sugar is improving.  Blood sugars have been 206, 176, 153. Using 26 units of Lantus each day. Patient's blood sugar has dropped overnight when bedtime reading <130 mg/dL. Encouraged patient to ensure eating a snack before bed when blood sugar is <140 mg/dL.   Plan  Recommend continue Lantus to 26 units every morning. Recommend a bedtime snack when bedtime blood sugar <140 mg/dL. Continue drinking boost/glucerna for additional nutrition support.     Medication Management   [facility] Uses pill box? Yes Pt endorses excellent compliance  We discussed: Working to avoid doughnut hole in the future and making medications more affordable. Pharmacist assisting with patient assistance renewal for 2022 - Entresto and Lantus.   Plan  Continue current medication management strategy    Follow up:  1 month phone visit

## 2020-06-06 NOTE — Patient Instructions (Signed)
Visit Information  Goals Addressed            This Visit's Progress   . Pharmacy Care Plan       CARE PLAN ENTRY (see longitudinal plan of care for additional care plan information)  Current Barriers:  . Chronic Disease Management support, education, and care coordination needs related to Hyperlipidemia, Diabetes, and Heart Failure   Heart Failure BP Readings from Last 3 Encounters:  04/25/20 122/68  04/02/20 (!) 102/54  03/29/20 (!) 150/62   . Pharmacist Clinical Goal(s): o Over the next 90 days, patient will work with PharmD and providers to maintain BP goal <130/80 . Current regimen:  o Carvedilol 25 mg twice daily o Entresto 49-51 mg twice daily  . Interventions: o Patient will complete Entresto application 11/23 when in office for appointment.  o Patient ordered Entresto refill through PAP and is supposed to arrive 11/09. Pharmacist encouraged patient to contact pharmacist if does not arrive in time. Pharmacist can coordinate samples.  . Patient self care activities - Over the next 90 days, patient will: o Check BP weekly, document, and provide at future appointments o Ensure daily salt intake < 2300 mg/day  Hyperlipidemia Lab Results  Component Value Date/Time   LDLCALC 121 (H) 05/03/2020 10:04 AM   . Pharmacist Clinical Goal(s): o Over the next 90 days, patient will work with PharmD and providers to achieve LDL goal < 70 . Current regimen:  o Aspirin ec 81 mg daily . Interventions: o Discussed benefits of statin and encouraged to continue taking statin as prescribed from cardiology.  o Keep up the good work with exercise.  o Incorporate lean meats, vegetables, fruits and whole grains in diet.  o Patient has discontinued statin at this time. Will revisit with cardiology. Pharmacist recommended patient consider resuming statin.  . Patient self care activities - Over the next 90 days, patient will: o Contact provider or pharmacist with any questions or concerns.    Diabetes Lab Results  Component Value Date/Time   HGBA1C 8.6 (H) 05/07/2020 10:38 AM   HGBA1C 7.5 (H) 01/17/2020 08:26 AM   . Pharmacist Clinical Goal(s): o Over the next 90 days, patient will work with PharmD and providers to achieve A1c goal <7% . Current regimen:  o Metformin 1000 mg bid with meals o Lantus 26 units daily in the morning.  . Interventions: o Reviewed home blood sugar log.  o Recommend continue Lantus 26 units daily per recent blood sugar results.  o Pharmacist coordinating patient assistance renewal for 2022.   [name] Patient self care activities - Over the next 90 days, patient will: o Check blood sugar 3-4 times daily, document, and provide at future appointments o Contact provider with any episodes of hypoglycemia  Medication management . Pharmacist Clinical Goal(s): o Over the next 90 days, patient will work with PharmD and providers to maintain optimal medication adherence . Current pharmacy: [facility] . Interventions o Comprehensive medication review performed. o Continue current medication management strategy . Patient self care activities - Over the next 90 days, patient will: o Focus on medication adherence by using a pill box o Take medications as prescribed o Report any questions or concerns to PharmD and/or provider(s)  Please see past updates related to this goal by clicking on the "Past Updates" button in the selected goal        The patient verbalized understanding of instructions, educational materials, and care plan provided today and declined offer to receive copy of patient  instructions, educational materials, and care plan.   Telephone follow up appointment with pharmacy team member scheduled for:06/2020  Earvin Hansen, [facility]  Diabetes Mellitus and Exercise Exercising regularly is important for your overall health, especially when you have diabetes (diabetes mellitus). Exercising is not only about losing weight. It has many other  health benefits, such as increasing muscle strength and bone density and reducing body fat and stress. This leads to improved fitness, flexibility, and endurance, all of which result in better overall health. Exercise has additional benefits for people with diabetes, including:  Reducing appetite.  Helping to lower and control blood glucose.  Lowering blood pressure.  Helping to control amounts of fatty substances (lipids) in the blood, such as cholesterol and triglycerides.  Helping the body to respond better to insulin (improving insulin sensitivity).  Reducing how much insulin the body needs.  Decreasing the risk for heart disease by: ? Lowering cholesterol and triglyceride levels. ? Increasing the levels of good cholesterol. ? Lowering blood glucose levels. What is my activity plan? Your health care provider or certified diabetes educator can help you make a plan for the type and frequency of exercise (activity plan) that works for you. Make sure that you:  Do at least 150 minutes of moderate-intensity or vigorous-intensity exercise each week. This could be brisk walking, biking, or water aerobics. ? Do stretching and strength exercises, such as yoga or weightlifting, at least 2 times a week. ? Spread out your activity over at least 3 days of the week.  Get some form of physical activity every day. ? Do not go more than 2 days in a row without some kind of physical activity. ? Avoid being inactive for more than 30 minutes at a time. Take frequent breaks to walk or stretch.  Choose a type of exercise or activity that you enjoy, and set realistic goals.  Start slowly, and gradually increase the intensity of your exercise over time. What do I need to know about managing my diabetes?   Check your blood glucose before and after exercising. ? If your blood glucose is 240 mg/dL (42.6 mmol/L) or higher before you exercise, check your urine for ketones. If you have ketones in your  urine, do not exercise until your blood glucose returns to normal. ? If your blood glucose is 100 mg/dL (5.6 mmol/L) or lower, eat a snack containing 15-20 grams of carbohydrate. Check your blood glucose 15 minutes after the snack to make sure that your level is above 100 mg/dL (5.6 mmol/L) before you start your exercise.  Know the symptoms of low blood glucose (hypoglycemia) and how to treat it. Your risk for hypoglycemia increases during and after exercise. Common symptoms of hypoglycemia can include: ? Hunger. ? Anxiety. ? Sweating and feeling clammy. ? Confusion. ? Dizziness or feeling light-headed. ? Increased heart rate or palpitations. ? Blurry vision. ? Tingling or numbness around the mouth, lips, or tongue. ? Tremors or shakes. ? Irritability.  Keep a rapid-acting carbohydrate snack available before, during, and after exercise to help prevent or treat hypoglycemia.  Avoid injecting insulin into areas of the body that are going to be exercised. For example, avoid injecting insulin into: ? The arms, when playing tennis. ? The legs, when jogging.  Keep records of your exercise habits. Doing this can help you and your health care provider adjust your diabetes management plan as needed. Write down: ? Food that you eat before and after you exercise. ? Blood glucose levels before  and after you exercise. ? The type and amount of exercise you have done. ? When your insulin is expected to peak, if you use insulin. Avoid exercising at times when your insulin is peaking.  When you start a new exercise or activity, work with your health care provider to make sure the activity is safe for you, and to adjust your insulin, medicines, or food intake as needed.  Drink plenty of water while you exercise to prevent dehydration or heat stroke. Drink enough fluid to keep your urine clear or pale yellow. Summary  Exercising regularly is important for your overall health, especially when you have  diabetes (diabetes mellitus).  Exercising has many health benefits, such as increasing muscle strength and bone density and reducing body fat and stress.  Your health care provider or certified diabetes educator can help you make a plan for the type and frequency of exercise (activity plan) that works for you.  When you start a new exercise or activity, work with your health care provider to make sure the activity is safe for you, and to adjust your insulin, medicines, or food intake as needed. This information is not intended to replace advice given to you by your health care provider. Make sure you discuss any questions you have with your health care provider. Document Revised: 01/08/2017 Document Reviewed: 11/26/2015 Elsevier Patient Education  2020 [employer].

## 2020-06-09 DIAGNOSIS — S2241XA Multiple fractures of ribs, right side, initial encounter for closed fracture: Secondary | ICD-10-CM | POA: Diagnosis not present

## 2020-06-09 DIAGNOSIS — S42211A Unspecified displaced fracture of surgical neck of right humerus, initial encounter for closed fracture: Secondary | ICD-10-CM | POA: Diagnosis not present

## 2020-06-09 DIAGNOSIS — M19011 Primary osteoarthritis, right shoulder: Secondary | ICD-10-CM | POA: Diagnosis not present

## 2020-06-12 ENCOUNTER — Ambulatory Visit: Payer: Self-pay

## 2020-06-15 DIAGNOSIS — S42291A Other displaced fracture of upper end of right humerus, initial encounter for closed fracture: Secondary | ICD-10-CM | POA: Diagnosis not present

## 2020-06-15 DIAGNOSIS — M7989 Other specified soft tissue disorders: Secondary | ICD-10-CM | POA: Diagnosis not present

## 2020-06-15 DIAGNOSIS — S42213A Unspecified displaced fracture of surgical neck of unspecified humerus, initial encounter for closed fracture: Secondary | ICD-10-CM | POA: Diagnosis not present

## 2020-06-15 DIAGNOSIS — S42251A Displaced fracture of greater tuberosity of right humerus, initial encounter for closed fracture: Secondary | ICD-10-CM | POA: Diagnosis not present

## 2020-06-19 ENCOUNTER — Telehealth: Payer: Self-pay | Admitting: Internal Medicine

## 2020-06-19 DIAGNOSIS — S42213A Unspecified displaced fracture of surgical neck of unspecified humerus, initial encounter for closed fracture: Secondary | ICD-10-CM | POA: Diagnosis not present

## 2020-06-19 NOTE — Telephone Encounter (Signed)
Brandy with [facility] health called in and would like the last office notes  From Dr Melburn Popper on  01/30/20  And Graciela Husbands 04/02/2020.  She also would like the last labs.  Call dropped .  I did not get a fax number or a call back number for the call dropped.  I she calls back please add CB number and Fax number to Note

## 2020-06-20 DIAGNOSIS — S42251A Displaced fracture of greater tuberosity of right humerus, initial encounter for closed fracture: Secondary | ICD-10-CM | POA: Diagnosis not present

## 2020-06-20 DIAGNOSIS — I10 Essential (primary) hypertension: Secondary | ICD-10-CM | POA: Diagnosis not present

## 2020-06-20 DIAGNOSIS — E119 Type 2 diabetes mellitus without complications: Secondary | ICD-10-CM | POA: Diagnosis not present

## 2020-06-20 DIAGNOSIS — S42201A Unspecified fracture of upper end of right humerus, initial encounter for closed fracture: Secondary | ICD-10-CM | POA: Diagnosis not present

## 2020-06-20 DIAGNOSIS — G8918 Other acute postprocedural pain: Secondary | ICD-10-CM | POA: Diagnosis not present

## 2020-06-22 ENCOUNTER — Telehealth: Payer: Self-pay | Admitting: Family Medicine

## 2020-06-22 DIAGNOSIS — R1110 Vomiting, unspecified: Secondary | ICD-10-CM | POA: Diagnosis not present

## 2020-06-22 DIAGNOSIS — J45909 Unspecified asthma, uncomplicated: Secondary | ICD-10-CM | POA: Diagnosis present

## 2020-06-22 DIAGNOSIS — J90 Pleural effusion, not elsewhere classified: Secondary | ICD-10-CM | POA: Diagnosis not present

## 2020-06-22 DIAGNOSIS — R112 Nausea with vomiting, unspecified: Secondary | ICD-10-CM | POA: Diagnosis not present

## 2020-06-22 DIAGNOSIS — Z951 Presence of aortocoronary bypass graft: Secondary | ICD-10-CM | POA: Diagnosis not present

## 2020-06-22 DIAGNOSIS — I361 Nonrheumatic tricuspid (valve) insufficiency: Secondary | ICD-10-CM | POA: Diagnosis not present

## 2020-06-22 DIAGNOSIS — D500 Iron deficiency anemia secondary to blood loss (chronic): Secondary | ICD-10-CM | POA: Diagnosis not present

## 2020-06-22 DIAGNOSIS — Z79899 Other long term (current) drug therapy: Secondary | ICD-10-CM | POA: Diagnosis not present

## 2020-06-22 DIAGNOSIS — I5022 Chronic systolic (congestive) heart failure: Secondary | ICD-10-CM | POA: Diagnosis not present

## 2020-06-22 DIAGNOSIS — D62 Acute posthemorrhagic anemia: Secondary | ICD-10-CM | POA: Diagnosis not present

## 2020-06-22 DIAGNOSIS — R5383 Other fatigue: Secondary | ICD-10-CM | POA: Diagnosis not present

## 2020-06-22 DIAGNOSIS — I110 Hypertensive heart disease with heart failure: Secondary | ICD-10-CM | POA: Diagnosis present

## 2020-06-22 DIAGNOSIS — E1110 Type 2 diabetes mellitus with ketoacidosis without coma: Secondary | ICD-10-CM | POA: Diagnosis not present

## 2020-06-22 DIAGNOSIS — G20 Parkinson's disease: Secondary | ICD-10-CM | POA: Diagnosis present

## 2020-06-22 DIAGNOSIS — Z66 Do not resuscitate: Secondary | ICD-10-CM | POA: Diagnosis present

## 2020-06-22 DIAGNOSIS — S2231XA Fracture of one rib, right side, initial encounter for closed fracture: Secondary | ICD-10-CM | POA: Diagnosis not present

## 2020-06-22 DIAGNOSIS — R110 Nausea: Secondary | ICD-10-CM | POA: Diagnosis not present

## 2020-06-22 DIAGNOSIS — Z950 Presence of cardiac pacemaker: Secondary | ICD-10-CM | POA: Diagnosis not present

## 2020-06-22 DIAGNOSIS — Z7982 Long term (current) use of aspirin: Secondary | ICD-10-CM | POA: Diagnosis not present

## 2020-06-22 DIAGNOSIS — S42211A Unspecified displaced fracture of surgical neck of right humerus, initial encounter for closed fracture: Secondary | ICD-10-CM | POA: Diagnosis not present

## 2020-06-22 DIAGNOSIS — I214 Non-ST elevation (NSTEMI) myocardial infarction: Secondary | ICD-10-CM | POA: Diagnosis not present

## 2020-06-22 DIAGNOSIS — R739 Hyperglycemia, unspecified: Secondary | ICD-10-CM | POA: Diagnosis not present

## 2020-06-22 DIAGNOSIS — Z794 Long term (current) use of insulin: Secondary | ICD-10-CM | POA: Diagnosis not present

## 2020-06-22 NOTE — Telephone Encounter (Addendum)
Patient's wife called in and his sugar is reading high on his meter. He had shoulder surgery 2 days ago and is not eating well. He has had nausea from oxycodone, which he has stopped.  He took Lantus 28 U this am.  He has some novolog 70/30. I had him take 10 U now, increase water intake. Recheck sugar in 1 hour and call back  2 pm: sugar still reading high on both CGM and finger stick.  Given Novolog 70/30 15 U. Pt to call back in one hour.   3:15 pm: sugar still reading high on CGM.  Given novolog 70/30 15 U. Pt to call me back in 1 hour.

## 2020-06-23 DIAGNOSIS — E1110 Type 2 diabetes mellitus with ketoacidosis without coma: Secondary | ICD-10-CM

## 2020-06-23 DIAGNOSIS — I361 Nonrheumatic tricuspid (valve) insufficiency: Secondary | ICD-10-CM

## 2020-06-23 DIAGNOSIS — I214 Non-ST elevation (NSTEMI) myocardial infarction: Secondary | ICD-10-CM

## 2020-06-23 DIAGNOSIS — I5022 Chronic systolic (congestive) heart failure: Secondary | ICD-10-CM

## 2020-06-23 DIAGNOSIS — Z794 Long term (current) use of insulin: Secondary | ICD-10-CM

## 2020-06-23 DIAGNOSIS — D62 Acute posthemorrhagic anemia: Secondary | ICD-10-CM

## 2020-06-24 DIAGNOSIS — I5022 Chronic systolic (congestive) heart failure: Secondary | ICD-10-CM | POA: Diagnosis not present

## 2020-06-24 DIAGNOSIS — D62 Acute posthemorrhagic anemia: Secondary | ICD-10-CM | POA: Diagnosis not present

## 2020-06-24 DIAGNOSIS — Z794 Long term (current) use of insulin: Secondary | ICD-10-CM | POA: Diagnosis not present

## 2020-06-24 DIAGNOSIS — E1110 Type 2 diabetes mellitus with ketoacidosis without coma: Secondary | ICD-10-CM | POA: Diagnosis not present

## 2020-06-24 DIAGNOSIS — I214 Non-ST elevation (NSTEMI) myocardial infarction: Secondary | ICD-10-CM | POA: Diagnosis not present

## 2020-06-27 NOTE — Chronic Care Management (AMB) (Signed)
Patient called pharmacist to inform of elevated blood sugar readings since falling and breaking shoulder 06/09/2020.   Home Blood sugar readings:   AM Lunch Sup. HS  12/12 349 299 195 183 12/13 82 156 212 265 12/14 260 323    Trying to limit things that would drive up his blood sugar. Has not been drinking boost but plans to get back to drinking that soon.  He reports current Lantus use is 27 units daily. Will call if any additional concerns with high or low blood sugar. Discussed stress/pain is likely driving up blood sugar numbers up. There is concern of increasing Lantus causing low blood sugar since fasting reading was 82 12/13 and patient's history of low blood sugar.   Juliane Lack, PharmD, [facility] Clinical Pharmacist [facility] [phone] (office) [phone] (mobile)

## 2020-07-02 DIAGNOSIS — M25611 Stiffness of right shoulder, not elsewhere classified: Secondary | ICD-10-CM | POA: Diagnosis not present

## 2020-07-02 DIAGNOSIS — M6281 Muscle weakness (generalized): Secondary | ICD-10-CM | POA: Diagnosis not present

## 2020-07-02 DIAGNOSIS — G20 Parkinson's disease: Secondary | ICD-10-CM | POA: Diagnosis not present

## 2020-07-02 DIAGNOSIS — I509 Heart failure, unspecified: Secondary | ICD-10-CM | POA: Diagnosis not present

## 2020-07-02 DIAGNOSIS — R2689 Other abnormalities of gait and mobility: Secondary | ICD-10-CM | POA: Diagnosis not present

## 2020-07-02 DIAGNOSIS — R2681 Unsteadiness on feet: Secondary | ICD-10-CM | POA: Diagnosis not present

## 2020-07-02 DIAGNOSIS — M25511 Pain in right shoulder: Secondary | ICD-10-CM | POA: Diagnosis not present

## 2020-07-02 DIAGNOSIS — S4291XA Fracture of right shoulder girdle, part unspecified, initial encounter for closed fracture: Secondary | ICD-10-CM | POA: Diagnosis not present

## 2020-07-02 DIAGNOSIS — M25621 Stiffness of right elbow, not elsewhere classified: Secondary | ICD-10-CM | POA: Diagnosis not present

## 2020-07-03 ENCOUNTER — Other Ambulatory Visit: Payer: Self-pay

## 2020-07-03 ENCOUNTER — Ambulatory Visit: Payer: Medicare Other

## 2020-07-03 DIAGNOSIS — E782 Mixed hyperlipidemia: Secondary | ICD-10-CM

## 2020-07-03 DIAGNOSIS — E1169 Type 2 diabetes mellitus with other specified complication: Secondary | ICD-10-CM

## 2020-07-03 NOTE — Chronic Care Management (AMB) (Signed)
Chronic Care Management Pharmacy  Name: Jeffrey Parsons  MRN: [id number] DOB: Jul 30, 1950   Chief Complaint/ HPI  Jeffrey Parsons,  70 y.o. , male presents for their Follow-Up CCM visit with the clinical pharmacist via telephone due to COVID-19 Pandemic.  PCP : [name], MD  Their chronic conditions include: CAD, HTN, mild intermittent asthma, essential tremor, parkinson's disease, HLD, DM.   Office Visits: 06/22/2020 - hospital visit for for elevated blood sugar, DKA and blood transfusion.  Consult Visit: None   Medications: Outpatient Encounter Medications as of 07/03/2020  Medication Sig  . rosuvastatin (CRESTOR) 10 MG tablet Take 10 mg by mouth daily.  [name] acetaminophen (TYLENOL) 500 MG tablet Take 500 mg by mouth daily as needed for moderate pain or headache.   . albuterol (PROAIR HFA) 108 (90 Base) MCG/ACT inhaler Inhale 2 puffs into the lungs every 6 (six) hours as needed for wheezing or shortness of breath.  [name] aspirin EC 81 MG tablet Take 1 tablet (81 mg total) by mouth daily.  . carbidopa-levodopa (SINEMET CR) 50-200 MG tablet Take 1 tablet by mouth every evening.   . carbidopa-levodopa (SINEMET IR) 25-250 MG tablet Take 1 tablet by mouth 4 (four) times daily.   . carvedilol (COREG) 25 MG tablet Take 1 tablet (25 mg total) by mouth 2 (two) times daily. (Patient taking differently: Take 12.5 mg by mouth 2 (two) times daily.)  . Continuous Blood Gluc Sensor (DEXCOM G6 SENSOR) MISC 1 each by Does not apply route once a week.  . Continuous Blood Gluc Sensor (FREESTYLE LIBRE 2 SENSOR) MISC by Does not apply route.  . insulin glargine (LANTUS SOLOSTAR) 100 UNIT/ML Solostar Pen Inject 30 Units into the skin daily. Stop insulin 70/30, keep check in sugars (Patient taking differently: Inject 27 Units into the skin in the morning. )  . Insulin Pen Needle (PEN NEEDLES 5/16") 30G X 8 MM MISC by Does not apply route.  . metFORMIN (GLUCOPHAGE) 500 MG tablet Take 2 tablets  (1,000 mg total) by mouth 2 (two) times daily with a meal. Start this with new insulin  . Multiple Vitamin (MULTIVITAMIN) tablet Take 1 tablet by mouth daily.  . sacubitril-valsartan (ENTRESTO) 49-51 MG Take 1 tablet by mouth 2 (two) times daily.  [name] topiramate (TOPAMAX) 50 MG tablet Take 50 mg by mouth 2 (two) times daily.  . vitamin B-12 (CYANOCOBALAMIN) 1000 MCG tablet Take 1,000 mcg by mouth daily.   No facility-administered encounter medications on file as of 07/03/2020.   Allergies  Allergen Reactions  . Onion     Upset stomach  . Sulfa Antibiotics Other (See Comments)    FLUSH    SDOH Screenings   Alcohol Screen: Not on file  Depression (PHQ2-9): Low Risk   . PHQ-2 Score: 2  Financial Resource Strain: Not on file  Food Insecurity: No Food Insecurity  . Worried About Charity fundraiser in the Last Year: Never true  . Ran Out of Food in the Last Year: Never true  Housing: Low Risk   . Last Housing Risk Score: 0  Physical Activity: Insufficiently Active  . Days of Exercise per Week: 3 days  . Minutes of Exercise per Session: 30 min  Social Connections: Socially Integrated  . Frequency of Communication with Friends and Family: More than three times a week  . Frequency of Social Gatherings with Friends and Family: Twice a week  . Attends Religious Services: More than 4 times per year  . Active Member  of Clubs or Organizations: Yes  . Attends Banker Meetings: More than 4 times per year  . Marital Status: Married  Stress: Not on file  Tobacco Use: Low Risk   . Smoking Tobacco Use: Never Smoker  . Smokeless Tobacco Use: Never Used  Transportation Needs: No Transportation Needs  . Lack of Transportation (Medical): No  . Lack of Transportation (Non-Medical): No     Current Diagnosis/Assessment:  Goals Addressed            This Visit's Progress   . Pharmacy Care Plan       CARE PLAN ENTRY (see longitudinal plan of care for additional care plan  information)  Current Barriers:  . Chronic Disease Management support, education, and care coordination needs related to Hyperlipidemia, Diabetes, and Heart Failure   Heart Failure BP Readings from Last 3 Encounters:  04/25/20 122/68  04/02/20 (!) 102/54  03/29/20 (!) 150/62   . Pharmacist Clinical Goal(s): o Over the next 90 days, patient will work with PharmD and providers to maintain BP goal <130/80 . Current regimen:  o Carvedilol 12.5 mg twice daily o Entresto 49-51 mg twice daily  . Interventions: o Pharmacist faxed Entresto patient assistance application.  . Patient self care activities - Over the next 90 days, patient will: o Check BP weekly, document, and provide at future appointments o Ensure daily salt intake < 2300 mg/day  Hyperlipidemia Lab Results  Component Value Date/Time   LDLCALC 121 (H) 05/03/2020 10:04 AM   . Pharmacist Clinical Goal(s): o Over the next 90 days, patient will work with PharmD and providers to achieve LDL goal < 70 . Current regimen:  o Aspirin ec 81 mg daily o Crestor 10 mg daily  . Interventions: o Discussed benefits of statin and encouraged to continue taking statin as prescribed from cardiology during recent hospitalization.  o Incorporate lean meats, vegetables, fruits and whole grains in diet.  . Patient self care activities - Over the next 90 days, patient will: o Contact provider or pharmacist with any questions or concerns.  o Keep scheduled visit with cardiology 07/30/2020.   Diabetes Lab Results  Component Value Date/Time   HGBA1C 8.6 (H) 05/07/2020 10:38 AM   HGBA1C 7.5 (H) 01/17/2020 08:26 AM   . Pharmacist Clinical Goal(s): o Over the next 90 days, patient will work with PharmD and providers to achieve A1c goal <7% . Current regimen:  o Metformin 1000 mg bid with meals o Lantus 26 units daily in the morning.  . Interventions: o Reviewed home blood sugar readings.  o Recommend continue Lantus 26 units daily per  recent blood sugar results.  o Pharmacist coordinating patient assistance renewal for Lantus 2022.  Form faxed 07/03/2020.  [name] Patient self care activities - Over the next 90 days, patient will: o Check blood sugar 3-4 times daily, document, and provide at future appointments o Contact provider with any episodes of hypoglycemia  Medication management . Pharmacist Clinical Goal(s): o Over the next 90 days, patient will work with PharmD and providers to maintain optimal medication adherence . [facility]: [facility] . Interventions o Comprehensive medication review performed. o Continue current medication management strategy . Patient self care activities - Over the next 90 days, patient will: o Focus on medication adherence by using a pill box o Take medications as prescribed o Report any questions or concerns to PharmD and/or provider(s)  Please see past updates related to this goal by clicking on the "Past Updates" button in  the selected goal       ,   Diabetes   Recent Relevant Labs: Lab Results  Component Value Date/Time   HGBA1C 8.6 (H) 05/07/2020 10:38 AM   HGBA1C 7.5 (H) 01/17/2020 08:26 AM   MICROALBUR 80 01/17/2020 08:38 AM     Checking BG: 3x per Day  Patient has failed these meds in past: Novolog, novolin 70/30,  Patient is currently uncontrolled on the following medications:   Lantus solostar 26 units daily  Metformin 1000 mg bid with a meal  insuln pen needles   Last diabetic Foot exam: No results found for: HMDIABEYEEXA  Last diabetic Eye exam: No results found for: HMDIABFOOTEX   We discussed: diet and exercise extensively.       Started PT this week and has home exercises to complete. Shoulder pain is resolving. Working to loosen up arm after being in sling. Discussed treating physical therapy visits similar to the gym with having a snack available if blood sugar begins to drop. Drinking boost this afternoon due to blood sugar 102 @3  pm.  Parkinson's is flaring up since not doing as much activity. Patient has been started back on Crestor 10 mg daily since hospitalization post surgery. Patient will follow-up with cardiology 07/30/2020. Patient states he does not like the way it makes him feel or how it has increased his blood sugar in the past. Patient will contact pharmacist with any concerns with increasing blood sugar.   States that he is still having fluctuations but improving. Reports back to regular eating. Patient reports that he does not have blood sugar numbers in front of him today. 102 mg/dL last night at bedtime and ate a snack.   [employer] patient assistance application faxed 01/04. Patient has box of insulin to pick up in office.     Plan  Recommend continue Lantus to 26 units every morning. Recommend a bedtime snack when bedtime blood sugar <140 mg/dL. Continue drinking boost/glucerna for additional nutrition support.     Medication Management   [facility] Uses pill box? Yes Pt endorses excellent compliance  We discussed: Working to avoid doughnut hole in the future and making medications more affordable. Pharmacist assisting with patient assistance renewal for 2022 - Entresto and Lantus.   Plan  Continue current medication management strategy    Follow up:  1 month phone visit

## 2020-07-03 NOTE — Patient Instructions (Signed)
Visit Information  Goals Addressed            This Visit's Progress   . Pharmacy Care Plan       CARE PLAN ENTRY (see longitudinal plan of care for additional care plan information)  Current Barriers:  . Chronic Disease Management support, education, and care coordination needs related to Hyperlipidemia, Diabetes, and Heart Failure   Heart Failure BP Readings from Last 3 Encounters:  04/25/20 122/68  04/02/20 (!) 102/54  03/29/20 (!) 150/62   . Pharmacist Clinical Goal(s): o Over the next 90 days, patient will work with PharmD and providers to maintain BP goal <130/80 . Current regimen:  o Carvedilol 12.5 mg twice daily o Entresto 49-51 mg twice daily  . Interventions: o Pharmacist faxed Entresto patient assistance application.  . Patient self care activities - Over the next 90 days, patient will: o Check BP weekly, document, and provide at future appointments o Ensure daily salt intake < 2300 mg/day  Hyperlipidemia Lab Results  Component Value Date/Time   LDLCALC 121 (H) 05/03/2020 10:04 AM   . Pharmacist Clinical Goal(s): o Over the next 90 days, patient will work with PharmD and providers to achieve LDL goal < 70 . Current regimen:  o Aspirin ec 81 mg daily o Crestor 10 mg daily  . Interventions: o Discussed benefits of statin and encouraged to continue taking statin as prescribed from cardiology during recent hospitalization.  o Incorporate lean meats, vegetables, fruits and whole grains in diet.  . Patient self care activities - Over the next 90 days, patient will: o Contact provider or pharmacist with any questions or concerns.  o Keep scheduled visit with cardiology 07/30/2020.   Diabetes Lab Results  Component Value Date/Time   HGBA1C 8.6 (H) 05/07/2020 10:38 AM   HGBA1C 7.5 (H) 01/17/2020 08:26 AM   . Pharmacist Clinical Goal(s): o Over the next 90 days, patient will work with PharmD and providers to achieve A1c goal <7% . Current regimen:   o Metformin 1000 mg bid with meals o Lantus 26 units daily in the morning.  . Interventions: o Reviewed home blood sugar readings.  o Recommend continue Lantus 26 units daily per recent blood sugar results.  o Pharmacist coordinating patient assistance renewal for Lantus 2022.  Form faxed 07/03/2020.  [name] Patient self care activities - Over the next 90 days, patient will: o Check blood sugar 3-4 times daily, document, and provide at future appointments o Contact provider with any episodes of hypoglycemia  Medication management . Pharmacist Clinical Goal(s): o Over the next 90 days, patient will work with PharmD and providers to maintain optimal medication adherence . [facility] . Interventions o Comprehensive medication review performed. o Continue current medication management strategy . Patient self care activities - Over the next 90 days, patient will: o Focus on medication adherence by using a pill box o Take medications as prescribed o Report any questions or concerns to PharmD and/or provider(s)  Please see past updates related to this goal by clicking on the "Past Updates" button in the selected goal        The patient verbalized understanding of instructions, educational materials, and care plan provided today and declined offer to receive copy of patient instructions, educational materials, and care plan.   Telephone follow up appointment with pharmacy team member scheduled for: 07/2020  Earvin Hansen, [facility]

## 2020-07-04 DIAGNOSIS — M25611 Stiffness of right shoulder, not elsewhere classified: Secondary | ICD-10-CM | POA: Diagnosis not present

## 2020-07-04 DIAGNOSIS — M6281 Muscle weakness (generalized): Secondary | ICD-10-CM | POA: Diagnosis not present

## 2020-07-04 DIAGNOSIS — M25511 Pain in right shoulder: Secondary | ICD-10-CM | POA: Diagnosis not present

## 2020-07-04 DIAGNOSIS — G20 Parkinson's disease: Secondary | ICD-10-CM | POA: Diagnosis not present

## 2020-07-04 DIAGNOSIS — M25621 Stiffness of right elbow, not elsewhere classified: Secondary | ICD-10-CM | POA: Diagnosis not present

## 2020-07-04 DIAGNOSIS — S4291XA Fracture of right shoulder girdle, part unspecified, initial encounter for closed fracture: Secondary | ICD-10-CM | POA: Diagnosis not present

## 2020-07-08 NOTE — Telephone Encounter (Signed)
Sugars continued to read high and I sent him to the ED. Kc

## 2020-07-11 ENCOUNTER — Ambulatory Visit (INDEPENDENT_AMBULATORY_CARE_PROVIDER_SITE_OTHER): Payer: Medicare Other

## 2020-07-11 DIAGNOSIS — I442 Atrioventricular block, complete: Secondary | ICD-10-CM | POA: Diagnosis not present

## 2020-07-12 LAB — CUP PACEART REMOTE DEVICE CHECK
Battery Remaining Longevity: 75 mo
Battery Voltage: 2.98 V
Brady Statistic AP VP Percent: 0.39 %
Brady Statistic AP VS Percent: 0.01 %
Brady Statistic AS VP Percent: 96.61 %
Brady Statistic AS VS Percent: 3 %
Brady Statistic RA Percent Paced: 0.39 %
Brady Statistic RV Percent Paced: 96.18 %
Date Time Interrogation Session: 20220112033423
HighPow Impedance: 51 Ohm
Implantable Lead Implant Date: 20111213
Implantable Lead Implant Date: 20191230
Implantable Lead Implant Date: 20191230
Implantable Lead Location: 753858
Implantable Lead Location: 753859
Implantable Lead Location: 753860
Implantable Lead Model: 5076
Implantable Pulse Generator Implant Date: 20191230
Lead Channel Impedance Value: 208.568
Lead Channel Impedance Value: 208.568
Lead Channel Impedance Value: 234.706
Lead Channel Impedance Value: 247.358
Lead Channel Impedance Value: 247.358
Lead Channel Impedance Value: 342 Ohm
Lead Channel Impedance Value: 380 Ohm
Lead Channel Impedance Value: 399 Ohm
Lead Channel Impedance Value: 437 Ohm
Lead Channel Impedance Value: 437 Ohm
Lead Channel Impedance Value: 437 Ohm
Lead Channel Impedance Value: 570 Ohm
Lead Channel Impedance Value: 722 Ohm
Lead Channel Impedance Value: 722 Ohm
Lead Channel Impedance Value: 760 Ohm
Lead Channel Impedance Value: 874 Ohm
Lead Channel Impedance Value: 893 Ohm
Lead Channel Impedance Value: 893 Ohm
Lead Channel Pacing Threshold Amplitude: 0.5 V
Lead Channel Pacing Threshold Amplitude: 0.75 V
Lead Channel Pacing Threshold Amplitude: 1.125 V
Lead Channel Pacing Threshold Pulse Width: 0.4 ms
Lead Channel Pacing Threshold Pulse Width: 0.4 ms
Lead Channel Pacing Threshold Pulse Width: 0.4 ms
Lead Channel Sensing Intrinsic Amplitude: 16.5 mV
Lead Channel Sensing Intrinsic Amplitude: 16.5 mV
Lead Channel Sensing Intrinsic Amplitude: 2.75 mV
Lead Channel Sensing Intrinsic Amplitude: 2.75 mV
Lead Channel Setting Pacing Amplitude: 1.5 V
Lead Channel Setting Pacing Amplitude: 1.75 V
Lead Channel Setting Pacing Amplitude: 2 V
Lead Channel Setting Pacing Pulse Width: 0.4 ms
Lead Channel Setting Pacing Pulse Width: 0.4 ms
Lead Channel Setting Sensing Sensitivity: 0.45 mV

## 2020-07-13 DIAGNOSIS — S4291XA Fracture of right shoulder girdle, part unspecified, initial encounter for closed fracture: Secondary | ICD-10-CM | POA: Diagnosis not present

## 2020-07-13 DIAGNOSIS — M25511 Pain in right shoulder: Secondary | ICD-10-CM | POA: Diagnosis not present

## 2020-07-13 DIAGNOSIS — M6281 Muscle weakness (generalized): Secondary | ICD-10-CM | POA: Diagnosis not present

## 2020-07-13 DIAGNOSIS — M25621 Stiffness of right elbow, not elsewhere classified: Secondary | ICD-10-CM | POA: Diagnosis not present

## 2020-07-13 DIAGNOSIS — M25611 Stiffness of right shoulder, not elsewhere classified: Secondary | ICD-10-CM | POA: Diagnosis not present

## 2020-07-13 DIAGNOSIS — G20 Parkinson's disease: Secondary | ICD-10-CM | POA: Diagnosis not present

## 2020-07-17 DIAGNOSIS — M25511 Pain in right shoulder: Secondary | ICD-10-CM | POA: Diagnosis not present

## 2020-07-17 DIAGNOSIS — M25621 Stiffness of right elbow, not elsewhere classified: Secondary | ICD-10-CM | POA: Diagnosis not present

## 2020-07-17 DIAGNOSIS — M6281 Muscle weakness (generalized): Secondary | ICD-10-CM | POA: Diagnosis not present

## 2020-07-17 DIAGNOSIS — M25611 Stiffness of right shoulder, not elsewhere classified: Secondary | ICD-10-CM | POA: Diagnosis not present

## 2020-07-17 DIAGNOSIS — S4291XA Fracture of right shoulder girdle, part unspecified, initial encounter for closed fracture: Secondary | ICD-10-CM | POA: Diagnosis not present

## 2020-07-17 DIAGNOSIS — G20 Parkinson's disease: Secondary | ICD-10-CM | POA: Diagnosis not present

## 2020-07-19 ENCOUNTER — Other Ambulatory Visit: Payer: Self-pay | Admitting: *Deleted

## 2020-07-19 DIAGNOSIS — M25621 Stiffness of right elbow, not elsewhere classified: Secondary | ICD-10-CM | POA: Diagnosis not present

## 2020-07-19 DIAGNOSIS — M6281 Muscle weakness (generalized): Secondary | ICD-10-CM | POA: Diagnosis not present

## 2020-07-19 DIAGNOSIS — M25511 Pain in right shoulder: Secondary | ICD-10-CM | POA: Diagnosis not present

## 2020-07-19 DIAGNOSIS — M25611 Stiffness of right shoulder, not elsewhere classified: Secondary | ICD-10-CM | POA: Diagnosis not present

## 2020-07-19 DIAGNOSIS — S4291XA Fracture of right shoulder girdle, part unspecified, initial encounter for closed fracture: Secondary | ICD-10-CM | POA: Diagnosis not present

## 2020-07-19 DIAGNOSIS — G20 Parkinson's disease: Secondary | ICD-10-CM | POA: Diagnosis not present

## 2020-07-19 MED ORDER — CARVEDILOL 12.5 MG PO TABS: 12.5000 mg | ORAL_TABLET | Freq: Two times a day (BID) | ORAL | 3 refills | Status: DC

## 2020-07-19 NOTE — Progress Notes (Signed)
Prescription   Nahser, [name], MD  You 12 minutes ago (4:21 PM)     Yes, Ok to refill Carvedilol 12.5 mg BID  Thanks   [employer] text    You  Nahser, [name], MD 3 hours ago (12:48 PM)     OK to fill carvedilol 12.5 mg BID? Since we can see documentation from [facility]?   Message text    Sonnie Alamo, MD 3 hours ago (12:39 PM)      On 12/26 I was in [facility]. I saw Dr Thomasene Ripple. She recommended reducing my Carvedilol from 25 MG to 12.5. The hospital Dr agreed and wrote a  prescription for the new dosage. I only have enough to last thru Mon 1/24 and my next Dr appointment is 1/31. [facility] response.

## 2020-07-24 ENCOUNTER — Telehealth: Payer: Self-pay

## 2020-07-24 NOTE — Progress Notes (Addendum)
Chronic Care Management Pharmacy Assistant   Name: Jeffrey Parsons  MRN: [id number] DOB: 1950-08-28  Reason for Encounter: Medication Review for Lantus PAP rejection   PCP : Abigail Miyamoto, MD  Allergies:   Allergies  Allergen Reactions   Onion     Upset stomach   Sulfa Antibiotics Other (See Comments)    FLUSH     Medications: Outpatient Encounter Medications as of 07/24/2020  Medication Sig   acetaminophen (TYLENOL) 500 MG tablet Take 500 mg by mouth daily as needed for moderate pain or headache.    albuterol (PROAIR HFA) 108 (90 Base) MCG/ACT inhaler Inhale 2 puffs into the lungs every 6 (six) hours as needed for wheezing or shortness of breath.   aspirin EC 81 MG tablet Take 1 tablet (81 mg total) by mouth daily.   carbidopa-levodopa (SINEMET CR) 50-200 MG tablet Take 1 tablet by mouth every evening.    carbidopa-levodopa (SINEMET IR) 25-250 MG tablet Take 1 tablet by mouth 4 (four) times daily.    carvedilol (COREG) 12.5 MG tablet Take 1 tablet (12.5 mg total) by mouth 2 (two) times daily.   Continuous Blood Gluc Sensor (DEXCOM G6 SENSOR) MISC 1 each by Does not apply route once a week.   Continuous Blood Gluc Sensor (FREESTYLE LIBRE 2 SENSOR) MISC by Does not apply route.   insulin glargine (LANTUS SOLOSTAR) 100 UNIT/ML Solostar Pen Inject 30 Units into the skin daily. Stop insulin 70/30, keep check in sugars (Patient taking differently: Inject 27 Units into the skin in the morning. )   Insulin Pen Needle (PEN NEEDLES 5/16") 30G X 8 MM MISC by Does not apply route.   metFORMIN (GLUCOPHAGE) 500 MG tablet Take 2 tablets (1,000 mg total) by mouth 2 (two) times daily with a meal. Start this with new insulin   Multiple Vitamin (MULTIVITAMIN) tablet Take 1 tablet by mouth daily.   rosuvastatin (CRESTOR) 10 MG tablet Take 10 mg by mouth daily.   sacubitril-valsartan (ENTRESTO) 49-51 MG Take 1 tablet by mouth 2 (two) times daily.   topiramate (TOPAMAX) 50 MG tablet Take  50 mg by mouth 2 (two) times daily.   vitamin B-12 (CYANOCOBALAMIN) 1000 MCG tablet Take 1,000 mcg by mouth daily.   No facility-administered encounter medications on file as of 07/24/2020.    Current Diagnosis: Patient Active Problem List   Diagnosis Date Noted   Fatigue 05/22/2020   BPH with obstruction/lower urinary tract symptoms 05/22/2020   BMI 23.0-23.9, adult 01/17/2020   Sick sinus syndrome (HCC) 09/13/2019   Obesity, diabetes, and hypertension syndrome (HCC) 09/13/2019   Secondary dystonia 07/04/2019   Essential hypertension 06/26/2018   Complete heart block (HCC) 05/26/2017   Cardiac device in situ 05/26/2017   Chronic combined systolic and diastolic CHF (congestive heart failure) (HCC) 05/26/2017   Ischemic cardiomyopathy 05/26/2017   Allergy to sulfa drugs 12/04/2016   S/P CABG x 5 09/12/2016   Mixed hyperlipidemia 09/12/2016   Coronary artery disease involving native coronary artery of native heart without angina pectoris 09/12/2016   Pacemaker 06/06/2016   Long term (current) use of aspirin 12/06/2015   Mild obesity 12/06/2015   Other long term (current) drug therapy 12/06/2015   Mild intermittent asthma with acute exacerbation 12/07/2014   Parkinson's disease (HCC) 12/07/2014    Called [employer] about patients lantus rejection.  Per the representative the patient has private/commercial insurance.  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  Patient now approved for Lantus through [employer] for 2022.   Follow-Up:  Patient Assistance Coordination and Pharmacist Review  Lucia Gaskins Notified  Leilani Able, [facility] Clinical Pharmacist Assistant [phone]

## 2020-07-25 DIAGNOSIS — M6281 Muscle weakness (generalized): Secondary | ICD-10-CM | POA: Diagnosis not present

## 2020-07-25 DIAGNOSIS — M25611 Stiffness of right shoulder, not elsewhere classified: Secondary | ICD-10-CM | POA: Diagnosis not present

## 2020-07-25 DIAGNOSIS — M25511 Pain in right shoulder: Secondary | ICD-10-CM | POA: Diagnosis not present

## 2020-07-25 DIAGNOSIS — S4291XA Fracture of right shoulder girdle, part unspecified, initial encounter for closed fracture: Secondary | ICD-10-CM | POA: Diagnosis not present

## 2020-07-25 DIAGNOSIS — M25621 Stiffness of right elbow, not elsewhere classified: Secondary | ICD-10-CM | POA: Diagnosis not present

## 2020-07-25 DIAGNOSIS — G20 Parkinson's disease: Secondary | ICD-10-CM | POA: Diagnosis not present

## 2020-07-25 NOTE — Progress Notes (Signed)
Remote ICD transmission.   

## 2020-07-27 DIAGNOSIS — S4291XA Fracture of right shoulder girdle, part unspecified, initial encounter for closed fracture: Secondary | ICD-10-CM | POA: Diagnosis not present

## 2020-07-27 DIAGNOSIS — G20 Parkinson's disease: Secondary | ICD-10-CM | POA: Diagnosis not present

## 2020-07-27 DIAGNOSIS — M25621 Stiffness of right elbow, not elsewhere classified: Secondary | ICD-10-CM | POA: Diagnosis not present

## 2020-07-27 DIAGNOSIS — M6281 Muscle weakness (generalized): Secondary | ICD-10-CM | POA: Diagnosis not present

## 2020-07-27 DIAGNOSIS — M25611 Stiffness of right shoulder, not elsewhere classified: Secondary | ICD-10-CM | POA: Diagnosis not present

## 2020-07-27 DIAGNOSIS — M25511 Pain in right shoulder: Secondary | ICD-10-CM | POA: Diagnosis not present

## 2020-07-29 ENCOUNTER — Encounter: Payer: Self-pay | Admitting: Cardiovascular Disease

## 2020-07-29 NOTE — Progress Notes (Signed)
Cardiology Office Note   Date:  07/30/2020   ID:  Tenoch Mcclure, DOB 08/03/1950, MRN [id number]  PCP:  Abigail Miyamoto, MD  Cardiologist:   Kristeen Miss, MD   Chief Complaint  Patient presents with  . Coronary Artery Disease       . Congestive Heart Failure   Problem lis 1. CAD - CABG atg 58 at [facility] ( 2002)  2. Diabetes 3. Pacer  4. Hyperlipidemia  5. Parkinsons disease 6. Asthma     Augusto Deckman is a 70 y.o. male who presents for further evaluation of his CAD CABG in 2001 at [place]  - age 47   Pacer in 2011 Recently moved to [place].  estabilshing care here   No angina .  Operated a [employer] for 40 years.  Now retired.  Exercises regularly   Nov. 82018:  Doing well.   No Cp or dyspea,  No cough or cold symptoms Goes to  the gym 5 days a week  On his own, he  Decreased the atorvastatin to 20   December 14, 2017: Doing well.  Has established with Dr. Graciela Husbands for EP . [employer] 5 days a week .  Does the bike, treadmill , weight machines  Hx of hyperlipidemia  BP is a bit low today  - is on Sinamet  Does not check it at home .   Eats regularly   July 30, 2018: Drayce is seen today for follow-up of his coronary artery disease.  He has a history of sick sinus syndrome and has a pacemaker / ICD.  He works out on a daily basis. Has chronic combined systolic and diastolic congestive heart failure.  His ejection fraction is 15 to 20%.   He has grade 3   diastolic filling. He has been started on Entresto 24-26 Lasix was increased to 40 mg a day  I plan on increasing the entresto to 49-51 BID and decrease the lasix to 20 mg a day     May 30, 2019:  Leah is seen today for follow-up of his chronic systolic congestive heart failure, coronary artery disease and sick sinus syndrome. We increased his Entresto to 49-51 at his last visit.  He seems to be tolerating it very well and feels good. Also has parkinsons which seems to be  gradually progression .   Is on Sinemet   Jan. 27, 2021: Shahram is doing ok -he has a history of coronary artery disease-status post coronary artery bypass grafting around 2001.  He has developed congestive heart failure.  He now has a biventricular ICD and has been on standard CHF medications.  Forgot his nightly dose of entresto for a week but has caught back up  Echocardiogram in December, 2020 reveals a persistently and severely reduced left ventricular systolic function.  The ejection fraction is 20 to 25%.  January 30, 2020: Rafi is seen back today for follow-up of his chronic systolic congestive heart failure, CAD, sick sinus syndrome.  He is on middle dose Entresto.  He has a biventricular ICD. He has Parkinson's disease. Has a tremor of right hand  No CP ,  No dyspnea.  Gets some exercise   - goes to [employer] 4 days a week   Jan. 31,2022; Demichael is seen back today for follow up of his CAD, CHF, SSS -s/p Bi-V pacer Hx of parkinsons disease He fell and broke his right shoulder several months ago  The was hospitalized for a diabetic  issue. ( was hypoglycemic from bine NPO all day for his shoulder surgery - then was hyperglycemic - )  Also was told that he had a heart attack.( perhaps was a troponin elevation ) . Was seen by Dr. Servando Salina. Who make some medication adjustments . EF in Dec. 2020 was 20-25%.    Past Medical History:  Diagnosis Date  . AICD (automatic cardioverter/defibrillator) present   . Complete heart block (HCC) 05/26/2017  . Coronary artery disease involving native coronary artery of native heart without angina pectoris 09/12/2016  . Ischemic cardiomyopathy 05/26/2017  . Pneumonia 2019  . Retinopathy     Past Surgical History:  Procedure Laterality Date  . BI-VENTRICULAR IMPLANTABLE CARDIOVERTER DEFIBRILLATOR UPGRADE  06/25/2018  . BIV UPGRADE N/A 06/25/2018   Procedure: BIV ICD UPGRADE;  Surgeon: Duke Salvia, MD;  Location: [facility] INVASIVE CV LAB;   Service: Cardiovascular;  Laterality: N/A;  . CARDIAC CATHETERIZATION  2001; ~ 2013  . CATARACT EXTRACTION W/ INTRAOCULAR LENS  IMPLANT, BILATERAL Bilateral   . CORONARY ARTERY BYPASS GRAFT  2001   "CABG X5"  . INSERT / REPLACE / REMOVE PACEMAKER  05/2010  . KNEE SURGERY Left    AS A CHILD  . RIGHT/LEFT HEART CATH AND CORONARY/GRAFT ANGIOGRAPHY N/A 06/16/2017   Procedure: RIGHT/LEFT HEART CATH AND CORONARY/GRAFT ANGIOGRAPHY;  Surgeon: Tonny Bollman, MD;  Location: [facility] INVASIVE CV LAB;  Service: Cardiovascular;  Laterality: N/A;  . TONSILLECTOMY       Current Outpatient Medications  Medication Sig Dispense Refill  . acetaminophen (TYLENOL) 500 MG tablet Take 500 mg by mouth daily as needed for moderate pain or headache.     . albuterol (VENTOLIN HFA) 108 (90 Base) MCG/ACT inhaler Inhale 2 puffs into the lungs every 6 (six) hours as needed for wheezing or shortness of breath.    [name] aspirin EC 81 MG tablet Take 1 tablet (81 mg total) by mouth daily.    . carbidopa-levodopa (SINEMET CR) 50-200 MG tablet Take 1 tablet by mouth every evening.     . carbidopa-levodopa (SINEMET IR) 25-250 MG tablet Take 1 tablet by mouth 4 (four) times daily.     . carvedilol (COREG) 12.5 MG tablet Take 1 tablet (12.5 mg total) by mouth 2 (two) times daily. 180 tablet 3  . Continuous Blood Gluc Sensor (DEXCOM G6 SENSOR) MISC 1 each by Does not apply route once a week. 9 each 4  . Continuous Blood Gluc Sensor (FREESTYLE LIBRE 2 SENSOR) MISC by Does not apply route.    . insulin glargine (LANTUS) 100 UNIT/ML Solostar Pen Inject 26 Units into the skin daily.    . Insulin Pen Needle (PEN NEEDLES 5/16") 30G X 8 MM MISC by Does not apply route.    . metFORMIN (GLUCOPHAGE) 500 MG tablet Take 2 tablets (1,000 mg total) by mouth 2 (two) times daily with a meal. Start this with new insulin 360 tablet 2  . Multiple Vitamin (MULTIVITAMIN) tablet Take 1 tablet by mouth daily.    . rosuvastatin (CRESTOR) 10 MG tablet Take 10  mg by mouth daily.    . sacubitril-valsartan (ENTRESTO) 49-51 MG Take 1 tablet by mouth 2 (two) times daily. 180 tablet 3  . topiramate (TOPAMAX) 50 MG tablet Take 50 mg by mouth 2 (two) times daily.     No current facility-administered medications for this visit.    PAD Screen 09/12/2016  Previous PAD dx? No  Previous surgical procedure? No  Pain with walking? No  Feet/toe relief  with dangling? No  Painful, non-healing ulcers? No  Extremities discolored? No      Allergies:   Onion and Sulfa antibiotics    Social History:  The patient  reports that he has never smoked. He has never used smokeless tobacco. He reports that he does not drink alcohol and does not use drugs.   Family History:  The patient's family history includes Bronchitis in his mother; Coronary artery disease in his father; Heart disease in his father; Hypertension in his brother and sister.    ROS:  Please see the history of present illness.   Physical Exam: Blood pressure 118/62, pulse 92, height 5\' 8"  (1.727 m), weight 155 lb (70.3 kg), SpO2 99 %.  GEN:  Well nourished, well developed in no acute distress HEENT: Normal NECK: No JVD; No carotid bruits LYMPHATICS: No lymphadenopathy CARDIAC: RRR , no murmurs, rubs, gallops RESPIRATORY:  Clear to auscultation without rales, wheezing or rhonchi  ABDOMEN: Soft, non-tender, non-distended MUSCULOSKELETAL:  No edema; No deformity  SKIN: Warm and dry NEUROLOGIC:  Alert and oriented x 3,  Fine pill rolling tremor     EKG:      Recent Labs: 03/29/2020: Hemoglobin 12.8; Platelets 176 05/03/2020: ALT 2; BUN 22; Creatinine, Ser 1.01; Potassium 5.0; Sodium 137 05/22/2020: TSH 1.440    Lipid Panel    Component Value Date/Time   CHOL 182 05/03/2020 1004   TRIG 93 05/03/2020 1004   HDL 44 05/03/2020 1004   CHOLHDL 4.1 05/03/2020 1004   LDLCALC 121 (H) 05/03/2020 1004      Wt Readings from Last 3 Encounters:  07/30/20 155 lb (70.3 kg)  05/22/20 152 lb  3.2 oz (69 kg)  04/25/20 157 lb (71.2 kg)      Other studies Reviewed: Additional studies/ records that were reviewed today include: . Review of the above records demonstrates:    ASSESSMENT AND PLAN:  1.  Chronic systolic congestive heart failure.  He does not want to do another echo .    He said he is sick of the hospital and testing .   Seems a bit depressed.  Cont current meds.    2.  CAD - no angina    3. Hyperlipidemia:    -cont rosuvastatin ,  He does not like being on a statin but does not appear to have any side effects.    3. Essential hypertension:    - BP is well controlled    Since I am now part time and have limited clinic time, will go ahead and reassign him to see Dr. 04/27/20 in the future.  He enjoyed seeing Dr. Servando Salina in the hospital recently so this should work out well.      Servando Salina, MD  07/30/2020 11:32 AM    [facility] [address] [place], [place], [place]  [place] Phone: [phone]; Fax: [phone]

## 2020-07-30 ENCOUNTER — Ambulatory Visit (INDEPENDENT_AMBULATORY_CARE_PROVIDER_SITE_OTHER): Payer: Medicare Other | Admitting: Cardiovascular Disease

## 2020-07-30 ENCOUNTER — Telehealth: Payer: Self-pay

## 2020-07-30 ENCOUNTER — Encounter: Payer: Self-pay | Admitting: Cardiovascular Disease

## 2020-07-30 ENCOUNTER — Other Ambulatory Visit: Payer: Self-pay

## 2020-07-30 VITALS — BP 118/62 | HR 92 | Ht 68.0 in | Wt 155.0 lb

## 2020-07-30 DIAGNOSIS — I5042 Chronic combined systolic (congestive) and diastolic (congestive) heart failure: Secondary | ICD-10-CM | POA: Diagnosis not present

## 2020-07-30 DIAGNOSIS — E1169 Type 2 diabetes mellitus with other specified complication: Secondary | ICD-10-CM

## 2020-07-30 NOTE — Telephone Encounter (Cosign Needed)
Patient's wife calls reporting that blood sugars have been going lower as he has started with physical therapy. Patient is completing physical therapy regimen twice daily. Patient is eating snacks and working to keep blood sugar in normal range. Wife states that they have decreased to 24 units of Lantus but are concerned with the low blood sugars in the 50s overnight. Pharmacist consulted with Gerre Pebbles (Dr. Marina Goodell was with a patient at the time) who approved reducing Lantus dose to 20 units daily. Patient is to call and updated if blood sugar readings do not improve.  Juliane Lack, PharmD, [facility] Clinical Pharmacist [facility] [phone] (office) [phone] (mobile)

## 2020-07-30 NOTE — Patient Instructions (Signed)
Medication Instructions:  Your physician recommends that you continue on your current medications as directed. Please refer to the Current Medication list given to you today.  *If you need a refill on your cardiac medications before your next appointment, please call your pharmacy*   Lab Work: None today If you have labs (blood work) drawn today and your tests are completely normal, you will receive your results only by: [name] MyChart Message (if you have MyChart) OR . A paper copy in the mail If you have any lab test that is abnormal or we need to change your treatment, we will call you to review the results.   Testing/Procedures: None ordered    Follow-Up: At [facility], you and your health needs are our priority.  As part of our continuing mission to provide you with exceptional heart care, we have created designated Provider Care Teams.  These Care Teams include your primary Cardiologist (physician) and Advanced Practice Providers (APPs -  Physician Assistants and Nurse Practitioners) who all work together to provide you with the care you need, when you need it.   Your next appointment:   Monday May 2nd at 10:20am in [place]  The format for your next appointment:   In Person  Provider:   Thomasene Ripple, DO

## 2020-07-31 DIAGNOSIS — G20 Parkinson's disease: Secondary | ICD-10-CM | POA: Diagnosis not present

## 2020-07-31 DIAGNOSIS — S4291XA Fracture of right shoulder girdle, part unspecified, initial encounter for closed fracture: Secondary | ICD-10-CM | POA: Diagnosis not present

## 2020-07-31 DIAGNOSIS — I509 Heart failure, unspecified: Secondary | ICD-10-CM | POA: Diagnosis not present

## 2020-08-02 DIAGNOSIS — G20 Parkinson's disease: Secondary | ICD-10-CM | POA: Diagnosis not present

## 2020-08-02 DIAGNOSIS — I509 Heart failure, unspecified: Secondary | ICD-10-CM | POA: Diagnosis not present

## 2020-08-02 DIAGNOSIS — S4291XA Fracture of right shoulder girdle, part unspecified, initial encounter for closed fracture: Secondary | ICD-10-CM | POA: Diagnosis not present

## 2020-08-07 ENCOUNTER — Other Ambulatory Visit: Payer: Self-pay

## 2020-08-07 ENCOUNTER — Ambulatory Visit (INDEPENDENT_AMBULATORY_CARE_PROVIDER_SITE_OTHER): Payer: Medicare Other

## 2020-08-07 DIAGNOSIS — E782 Mixed hyperlipidemia: Secondary | ICD-10-CM

## 2020-08-07 DIAGNOSIS — E1169 Type 2 diabetes mellitus with other specified complication: Secondary | ICD-10-CM

## 2020-08-07 NOTE — Progress Notes (Signed)
Chronic Care Management Pharmacy Note  08/10/2020 Name:  Jeffrey Parsons MRN:  [id number] DOB:  Mar 22, 1951  Subjective: Jeffrey Parsons is an 70 y.o. year old male who is a primary patient of [name], Zeb Comfort, MD.  The CCM team was consulted for assistance with disease management and care coordination needs.    Engaged with patient by telephone for follow up visit in response to provider referral for pharmacy case management and/or care coordination services.   Consent to Services:  The patient was given information about Chronic Care Management services, agreed to services, and gave verbal consent prior to initiation of services.  Please see initial visit note for detailed documentation.   Patient Care Team: [name], MD as PCP - General (Family Medicine) Nahser, Wonda Cheng, MD as PCP - Cardiology (Cardiology) Deboraha Sprang, MD as PCP - Electrophysiology (Cardiology) Burnice Logan, [facility] (Pharmacist)  Recent office visits: None since last visit  Recent consult visits: 07/30/2020 - cardio - does not want echocardiogram. Continue current medications. Continue rosuvastatin. Patient will follow-up with Dr. Harriet Masson.   Objective:  Lab Results  Component Value Date   CREATININE 1.01 05/03/2020   BUN 22 05/03/2020   GFRNONAA 76 05/03/2020   GFRAA 87 05/03/2020   NA 137 05/03/2020   K 5.0 05/03/2020   CALCIUM 8.8 05/03/2020   CO2 22 05/03/2020    Lab Results  Component Value Date/Time   HGBA1C 8.6 (H) 05/07/2020 10:38 AM   HGBA1C 7.5 (H) 01/17/2020 08:26 AM   MICROALBUR 80 01/17/2020 08:38 AM    Last diabetic Eye exam: No results found for: HMDIABEYEEXA  Last diabetic Foot exam: No results found for: HMDIABFOOTEX   Lab Results  Component Value Date   CHOL 182 05/03/2020   HDL 44 05/03/2020   LDLCALC 121 (H) 05/03/2020   TRIG 93 05/03/2020   CHOLHDL 4.1 05/03/2020    Hepatic Function Latest Ref Rng & Units 05/03/2020 01/17/2020 09/13/2019  Total  Protein 6.0 - 8.5 g/dL 6.5 6.8 6.1  Albumin 3.8 - 4.8 g/dL 4.4 4.1 4.1  AST 0 - 40 IU/L 14 15 15   ALT 0 - 44 IU/L 2 4 5   Alk Phosphatase 44 - 121 IU/L 121 114 132(H)  Total Bilirubin 0.0 - 1.2 mg/dL 1.0 0.7 0.7  Bilirubin, Direct 0.00 - 0.40 mg/dL 0.23 - -    Lab Results  Component Value Date/Time   TSH 1.440 05/22/2020 08:49 AM    CBC Latest Ref Rng & Units 03/29/2020 01/17/2020 09/13/2019  WBC 4.0 - 10.5 K/uL 7.0 8.2 7.1  Hemoglobin 13.0 - 17.0 g/dL 12.8(L) 13.7 13.7  Hematocrit 39.0 - 52.0 % 38.1(L) 40.7 40.8  Platelets 150 - 400 K/uL 176 165 170    No results found for: VD25OH  Clinical ASCVD: Yes  The ASCVD Risk score Mikey Bussing DC Jr., et al., 2013) failed to calculate for the following reasons:   The patient has a prior MI or stroke diagnosis    Depression screen [facility] 2/9 01/17/2020  Decreased Interest 0  Down, Depressed, Hopeless 0  PHQ - 2 Score 0  Altered sleeping 0  Tired, decreased energy 1  Change in appetite 0  Feeling bad or failure about yourself  0  Trouble concentrating 0  Moving slowly or fidgety/restless 1  Suicidal thoughts 0  PHQ-9 Score 2  Difficult doing work/chores Not difficult at all      Social History   Tobacco Use  Smoking Status Never Smoker  Smokeless Tobacco  Never Used   BP Readings from Last 3 Encounters:  07/30/20 118/62  05/22/20 (!) 124/56  04/25/20 122/68   Pulse Readings from Last 3 Encounters:  07/30/20 92  05/22/20 (!) 104  04/25/20 81   Wt Readings from Last 3 Encounters:  07/30/20 155 lb (70.3 kg)  05/22/20 152 lb 3.2 oz (69 kg)  04/25/20 157 lb (71.2 kg)    Assessment/Interventions: Review of patient past medical history, allergies, medications, health status, including review of consultants reports, laboratory and other test data, was performed as part of comprehensive evaluation and provision of chronic care management services.   SDOH:  (Social Determinants of Health) assessments and interventions performed:  Yes   CCM Care Plan  Allergies  Allergen Reactions  . Onion     Upset stomach  . Sulfa Antibiotics Other (See Comments)    FLUSH     Medications Reviewed Today    Reviewed by Georgiann Cocker, CMA (Certified Medical Assistant) on 07/30/20 at 1102  Med List Status: <None>  Medication Order Taking? Sig Documenting Provider Last Dose Status Informant  acetaminophen (TYLENOL) 500 MG tablet [id number] Yes Take 500 mg by mouth daily as needed for moderate pain or headache.  [provider] Taking Active Multiple Informants  albuterol (VENTOLIN HFA) 108 (90 Base) MCG/ACT inhaler [id number] Yes Inhale 2 puffs into the lungs every 6 (six) hours as needed for wheezing or shortness of breath. [provider] Taking Active Multiple Informants  aspirin EC 81 MG tablet [id number] Yes Take 1 tablet (81 mg total) by mouth daily. Richardson Dopp T, PA-C Taking Active Multiple Informants  carbidopa-levodopa (SINEMET CR) 50-200 MG tablet [id number] Yes Take 1 tablet by mouth every evening.  [provider] Taking Active Multiple Informants  carbidopa-levodopa (SINEMET IR) 25-250 MG tablet [id number] Yes Take 1 tablet by mouth 4 (four) times daily.  [provider] Taking Active Multiple Informants  carvedilol (COREG) 12.5 MG tablet [id number] Yes Take 1 tablet (12.5 mg total) by mouth 2 (two) times daily. Nahser, Wonda Cheng, MD Taking Active   Continuous Blood Gluc Sensor (DEXCOM G6 SENSOR) MISC [id number] Yes 1 each by Does not apply route once a week. [name], MD Taking Active Multiple Informants  Continuous Blood Gluc Sensor (FREESTYLE LIBRE 2 SENSOR) [place] [id number] Yes by Does not apply route. [provider] Taking Active Multiple Informants  insulin glargine (LANTUS) 100 UNIT/ML Solostar Pen [id number] Yes Inject 26 Units into the skin daily. [provider] Taking Active   Insulin Pen Needle (PEN NEEDLES 5/16") 30G X 8 MM MISC [id number] Yes by  Does not apply route. [provider] Taking Active Multiple Informants  metFORMIN (GLUCOPHAGE) 500 MG tablet [id number] Yes Take 2 tablets (1,000 mg total) by mouth 2 (two) times daily with a meal. Start this with new insulin [name], MD Taking Active   Multiple Vitamin (MULTIVITAMIN) tablet [id number] Yes Take 1 tablet by mouth daily. [provider] Taking Active Multiple Informants  rosuvastatin (CRESTOR) 10 MG tablet [id number] Yes Take 10 mg by mouth daily. [provider] Taking Active   sacubitril-valsartan (ENTRESTO) 49-51 MG [id number] Yes Take 1 tablet by mouth 2 (two) times daily. Nahser, Wonda Cheng, MD Taking Active Multiple Informants  topiramate (TOPAMAX) 50 MG tablet [id number] Yes Take 50 mg by mouth 2 (two) times daily. [provider] Taking Active Multiple Informants        Discontinued 07/30/20 1102 (Patient Preference)  Patient Active Problem List   Diagnosis Date Noted  . Fatigue 05/22/2020  . BPH with obstruction/lower urinary tract symptoms 05/22/2020  . BMI 23.0-23.9, adult 01/17/2020  . Sick sinus syndrome ([place]) 09/13/2019  . Obesity, diabetes, and hypertension syndrome ([place]) 09/13/2019  . Secondary dystonia 07/04/2019  . Essential hypertension 06/26/2018  . Complete heart block ([place]) 05/26/2017  . Cardiac device in situ 05/26/2017  . Chronic combined systolic and diastolic CHF (congestive heart failure) ([place]) 05/26/2017  . Ischemic cardiomyopathy 05/26/2017  . Allergy to sulfa drugs 12/04/2016  . S/P CABG x 5 09/12/2016  . Mixed hyperlipidemia 09/12/2016  . Coronary artery disease involving native coronary artery of native heart without angina pectoris 09/12/2016  . Pacemaker 06/06/2016  . Long term (current) use of aspirin 12/06/2015  . Mild obesity 12/06/2015  . Other long term (current) drug therapy 12/06/2015  . Mild intermittent asthma with acute exacerbation 12/07/2014  . Parkinson's disease ([place])  12/07/2014    Immunization History  Administered Date(s) Administered  . Moderna Sars-Covid-2 Vaccination 08/03/2019, 08/31/2019    Conditions to be addressed/monitored:  Hyperlipidemia, Diabetes and Heart Failure  Care Plan : [place]  Updates made by Burnice Logan, RPH since 08/10/2020 12:00 AM    Problem: CAD, Diabetes, Hypertension   Priority: High  Onset Date: 08/07/2020    Long-Range Goal: Disease Management   Start Date: 08/07/2020  Expected End Date: 08/07/2021  This Visit's Progress: On track  Priority: High  Note:    Current Barriers:  . Unable to achieve control of blood sugar without nocturnal hypoglycemia.    Pharmacist Clinical Goal(s):  [name] Over the next 90 days, patient will achieve control of Diabetes as evidenced by a1c and reduced night time hypoglycemia through collaboration with PharmD and provider.   Interventions: . 1:1 collaboration with [name], MD regarding development and update of comprehensive plan of care as evidenced by provider attestation and co-signature . Inter-disciplinary care team collaboration (see longitudinal plan of care) . Comprehensive medication review performed; medication list updated in electronic medical record  Hyperlipidemia: (LDL goal < 55) -uncontrolled -Current treatment: . Crestor 10 mg daily  -Medications previously tried: pravastatin, atorvastatin  -Current dietary patterns: eating balanced meals and limiting carbohydrates.  -Current exercise habits: Rehab for shoulder twice daily. Working to get back to [employer].  -Educated on [employer];  Benefits of statin for ASCVD risk reduction; Importance of limiting foods high in cholesterol; Exercise goal of 150 minutes per week; -Counseled on diet and exercise extensively Recommended to continue current medication  Diabetes (A1c goal <7%) -uncontrolled -Current medications: . Lantus 20 units daily  . Metformin 1000 mg bid   -Medications previously tried: novolin 70/30  -Current home glucose readings . fasting glucose: 59-252 . post prandial glucose: 136-204 -Reports hypoglycemic/hyperglycemic symptoms -Current meal patterns:  . breakfast: eggs . lunch: trying to eat balanced pate . dinner: meat and vegetables . snacks: ensure/boost . drinks: juice to correct low blood sugar -Current exercise: rehab for shoulder twice daily  -Educated onA1c and blood sugar goals; Exercise goal of 150 minutes per week; Prevention and management of hypoglycemic episodes; Continuous glucose monitoring; Sick day management: glucometer readings QID until well and stable, maintain hydration with high fluid intake and call MD if glucose above 400 -Counseled to check feet daily and get yearly eye exams -Counseled on diet and exercise extensively Collaborated with Dr. [name] to reduce Lantus to 18 units daily   Patient Goals/Self-Care Activities . Over  the next 90 days, patient will:  - take medications as prescribed check glucose with CGM, document, and provide at future appointments  Follow Up Plan: Telephone follow up appointment with care management team member scheduled for:      Medication Assistance: Delene Loll obtained through [employer] medication assistance program.  Enrollment ends 06/29/2021. Lantus obtained through [employer] patient assistance.   Patient's preferred pharmacy is:  [id number] [place] [address]Phone: [phone] Fax: [phone] Uses pill box? Yes Pt endorses excellent compliance  We discussed: Current pharmacy is preferred with insurance plan and patient is satisfied with pharmacy services Patient decided to: Continue current medication management strategy  Follow Up:  Patient agrees to Care Plan and Follow-up.  Plan: Telephone follow up appointment with care management team member scheduled for:   08/2020

## 2020-08-08 DIAGNOSIS — G20 Parkinson's disease: Secondary | ICD-10-CM | POA: Diagnosis not present

## 2020-08-08 DIAGNOSIS — S4291XA Fracture of right shoulder girdle, part unspecified, initial encounter for closed fracture: Secondary | ICD-10-CM | POA: Diagnosis not present

## 2020-08-08 DIAGNOSIS — I509 Heart failure, unspecified: Secondary | ICD-10-CM | POA: Diagnosis not present

## 2020-08-10 DIAGNOSIS — S4291XA Fracture of right shoulder girdle, part unspecified, initial encounter for closed fracture: Secondary | ICD-10-CM | POA: Diagnosis not present

## 2020-08-10 DIAGNOSIS — I509 Heart failure, unspecified: Secondary | ICD-10-CM | POA: Diagnosis not present

## 2020-08-10 DIAGNOSIS — G20 Parkinson's disease: Secondary | ICD-10-CM | POA: Diagnosis not present

## 2020-08-10 NOTE — Patient Instructions (Addendum)
Visit Information  Goals Addressed            This Visit's Progress   . Improve My Heart Health-Coronary Artery Disease      . Monitor and Manage My Blood Sugar-Diabetes Type 2      . Set My Target A1C-Diabetes Type 2      . Track and Manage Activity and Exertion-Heart Failure        Patient Care Plan: CCM Pharmacy Care Plan    Problem Identified: CAD, Diabetes, Hypertension   Priority: High  Onset Date: 08/07/2020    Long-Range Goal: Disease Management   Start Date: 08/07/2020  Expected End Date: 08/07/2021  This Visit's Progress: On track  Priority: High  Note:    Current Barriers:  . Unable to achieve control of blood sugar without nocturnal hypoglycemia.    Pharmacist Clinical Goal(s):  [name] Over the next 90 days, patient will achieve control of Diabetes as evidenced by a1c and reduced night time hypoglycemia through collaboration with PharmD and provider.   Interventions: . 1:1 collaboration with Abigail Miyamoto, MD regarding development and update of comprehensive plan of care as evidenced by provider attestation and co-signature . Inter-disciplinary care team collaboration (see longitudinal plan of care) . Comprehensive medication review performed; medication list updated in electronic medical record  Hyperlipidemia: (LDL goal < 55) -uncontrolled -Current treatment: . Crestor 10 mg daily  -Medications previously tried: pravastatin, atorvastatin  -Current dietary patterns: eating balanced meals and limiting carbohydrates.  -Current exercise habits: Rehab for shoulder twice daily. Working to get back to [employer].  -Educated on [employer];  Benefits of statin for ASCVD risk reduction; Importance of limiting foods high in cholesterol; Exercise goal of 150 minutes per week; -Counseled on diet and exercise extensively Recommended to continue current medication  Diabetes (A1c goal <7%) -uncontrolled -Current medications: . Lantus 20 units daily   . Metformin 1000 mg bid  -Medications previously tried: novolin 70/30  -Current home glucose readings . fasting glucose: 59-252 . post prandial glucose: 136-204 -Reports hypoglycemic/hyperglycemic symptoms -Current meal patterns:  . breakfast: eggs . lunch: trying to eat balanced pate . dinner: meat and vegetables . snacks: ensure/boost . drinks: juice to correct low blood sugar -Current exercise: rehab for shoulder twice daily  -Educated onA1c and blood sugar goals; Exercise goal of 150 minutes per week; Prevention and management of hypoglycemic episodes; Continuous glucose monitoring; Sick day management: glucometer readings QID until well and stable, maintain hydration with high fluid intake and call MD if glucose above 400 -Counseled to check feet daily and get yearly eye exams -Counseled on diet and exercise extensively Collaborated with Dr. Marina Goodell to reduce Lantus to 18 units daily   Patient Goals/Self-Care Activities . Over the next 90 days, patient will:  - take medications as prescribed check glucose with CGM, document, and provide at future appointments  Follow Up Plan: Telephone follow up appointment with care management team member scheduled for:     The patient verbalized understanding of instructions, educational materials, and care plan provided today and declined offer to receive copy of patient instructions, educational materials, and care plan.   Telephone follow up appointment with pharmacy team member scheduled for: 08/2020  Earvin Hansen, [facility]  Diabetes Mellitus and Foot Care Foot care is an important part of your health, especially when you have diabetes. Diabetes may cause you to have problems because of poor blood flow (circulation) to your feet and legs, which can cause your skin to:  Become thinner and drier.  Break more easily.  Heal more slowly.  Peel and crack. You may also have nerve damage (neuropathy) in your legs and feet, causing  decreased feeling in them. This means that you may not notice minor injuries to your feet that could lead to more serious problems. Noticing and addressing any potential problems early is the best way to prevent future foot problems. How to care for your feet Foot hygiene  Wash your feet daily with warm water and mild soap. Do not use hot water. Then, pat your feet and the areas between your toes until they are completely dry. Do not soak your feet as this can dry your skin.  Trim your toenails straight across. Do not dig under them or around the cuticle. File the edges of your nails with an emery board or nail file.  Apply a moisturizing lotion or petroleum jelly to the skin on your feet and to dry, brittle toenails. Use lotion that does not contain alcohol and is unscented. Do not apply lotion between your toes.   Shoes and socks  Wear clean socks or stockings every day. Make sure they are not too tight. Do not wear knee-high stockings since they may decrease blood flow to your legs.  Wear shoes that fit properly and have enough cushioning. Always look in your shoes before you put them on to be sure there are no objects inside.  To break in new shoes, wear them for just a few hours a day. This prevents injuries on your feet. Wounds, scrapes, corns, and calluses  Check your feet daily for blisters, cuts, bruises, sores, and redness. If you cannot see the bottom of your feet, use a mirror or ask someone for help.  Do not cut corns or calluses or try to remove them with medicine.  If you find a minor scrape, cut, or break in the skin on your feet, keep it and the skin around it clean and dry. You may clean these areas with mild soap and water. Do not clean the area with peroxide, alcohol, or iodine.  If you have a wound, scrape, corn, or callus on your foot, look at it several times a day to make sure it is healing and not infected. Check for: ? Redness, swelling, or pain. ? Fluid or  blood. ? Warmth. ? Pus or a bad smell.   General tips  Do not cross your legs. This may decrease blood flow to your feet.  Do not use heating pads or hot water bottles on your feet. They may burn your skin. If you have lost feeling in your feet or legs, you may not know this is happening until it is too late.  Protect your feet from hot and cold by wearing shoes, such as at the beach or on hot pavement.  Schedule a complete foot exam at least once a year (annually) or more often if you have foot problems. Report any cuts, sores, or bruises to your health care provider immediately. Where to find more information  American Diabetes Association: www.diabetes.org  Association of Diabetes Care & Education Specialists: www.diabeteseducator.org Contact a health care provider if:  You have a medical condition that increases your risk of infection and you have any cuts, sores, or bruises on your feet.  You have an injury that is not healing.  You have redness on your legs or feet.  You feel burning or tingling in your legs or feet.  You have pain  or cramps in your legs and feet.  Your legs or feet are numb.  Your feet always feel cold.  You have pain around any toenails. Get help right away if:  You have a wound, scrape, corn, or callus on your foot and: ? You have pain, swelling, or redness that gets worse. ? You have fluid or blood coming from the wound, scrape, corn, or callus. ? Your wound, scrape, corn, or callus feels warm to the touch. ? You have pus or a bad smell coming from the wound, scrape, corn, or callus. ? You have a fever. ? You have a red line going up your leg. Summary  Check your feet every day for blisters, cuts, bruises, sores, and redness.  Apply a moisturizing lotion or petroleum jelly to the skin on your feet and to dry, brittle toenails.  Wear shoes that fit properly and have enough cushioning.  If you have foot problems, report any cuts, sores, or  bruises to your health care provider immediately.  Schedule a complete foot exam at least once a year (annually) or more often if you have foot problems. This information is not intended to replace advice given to you by your health care provider. Make sure you discuss any questions you have with your health care provider. Document Revised: 01/05/2020 Document Reviewed: 01/05/2020 Elsevier Patient Education  2021 [employer].

## 2020-08-11 IMAGING — CR DG CHEST 2V
2 series · 2 of 2 positions shown · non-contrast
Comparison: None.

CLINICAL DATA: Biventricular pacing defibrillator placement
yesterday.

EXAM:
CHEST - 2 VIEW

[chest pa]
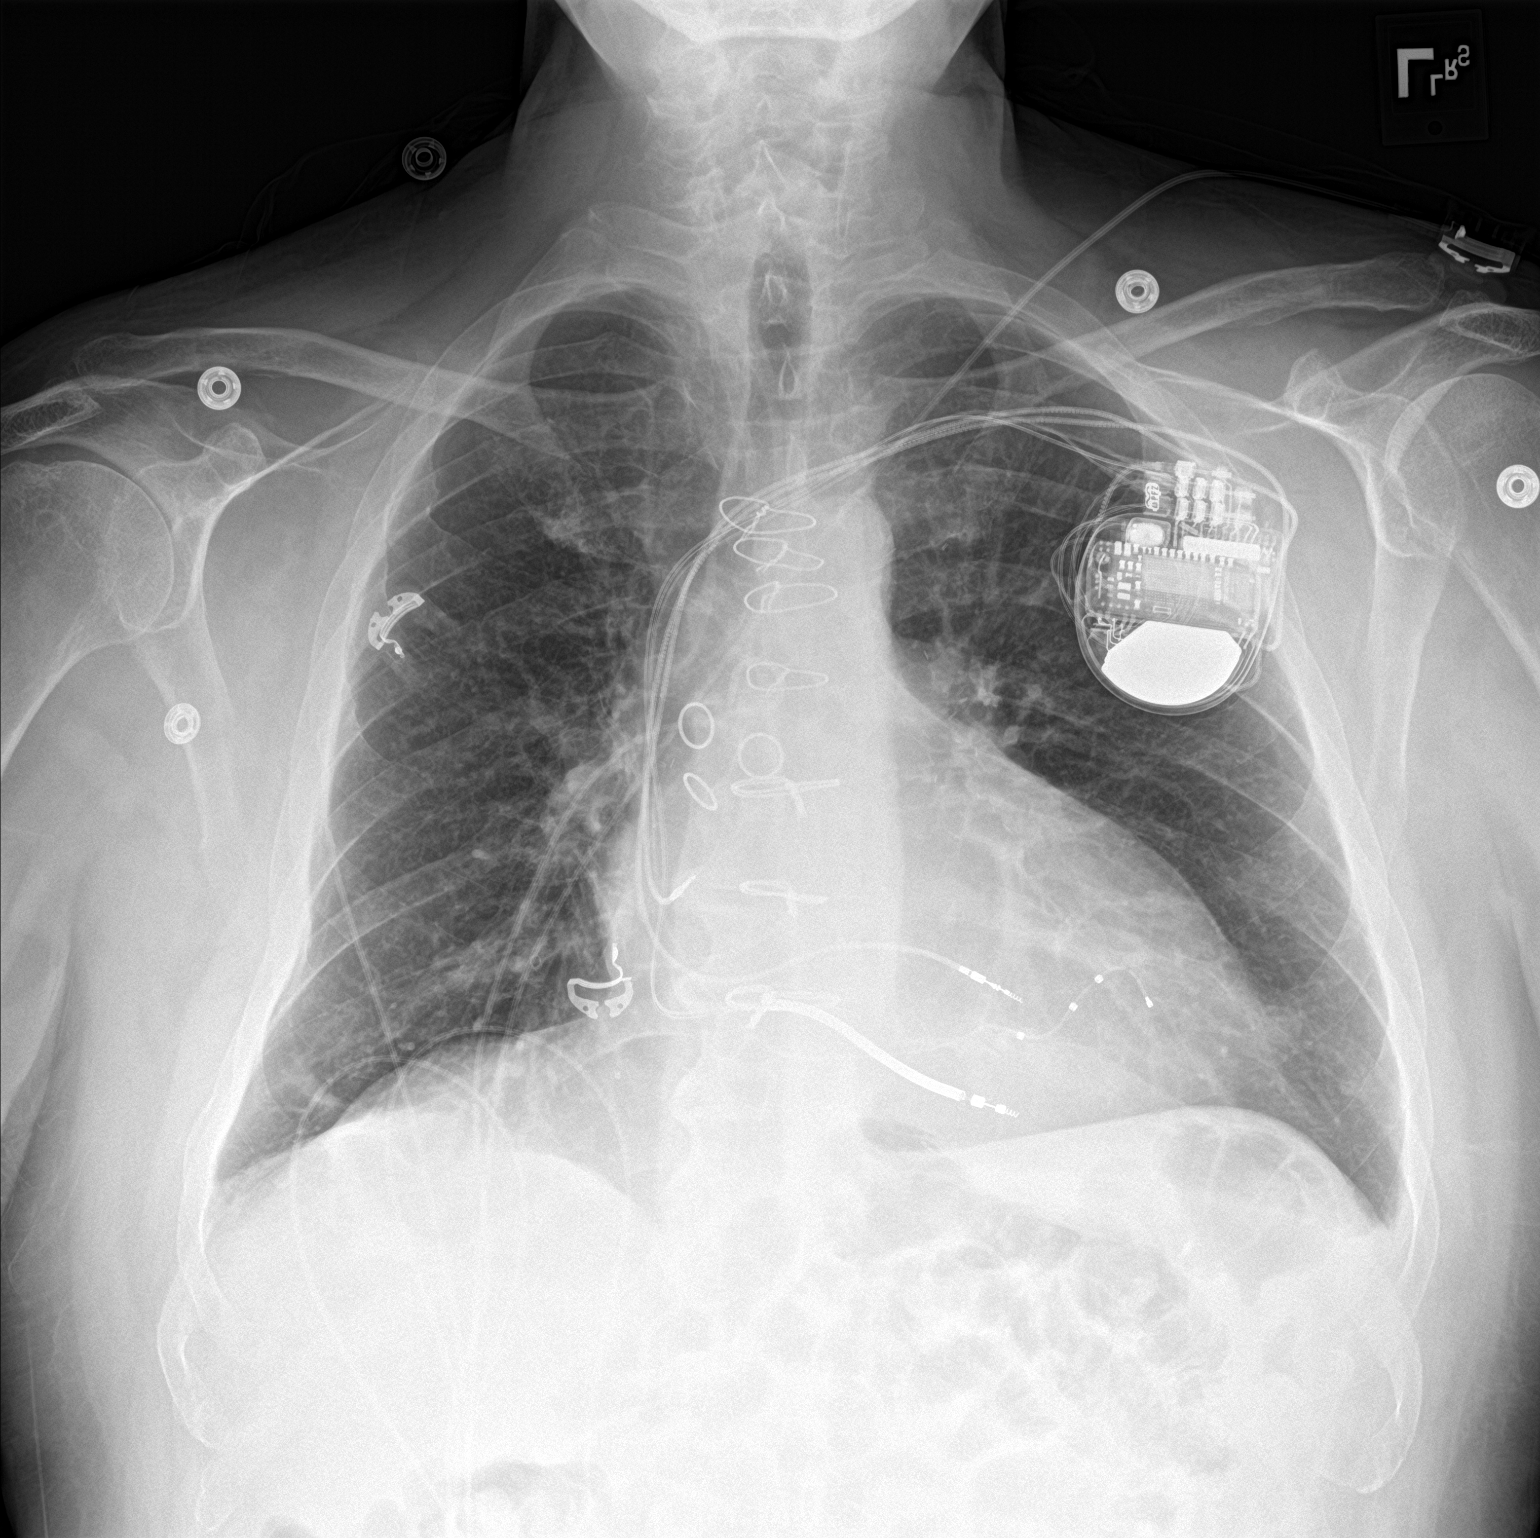

[chest lat]
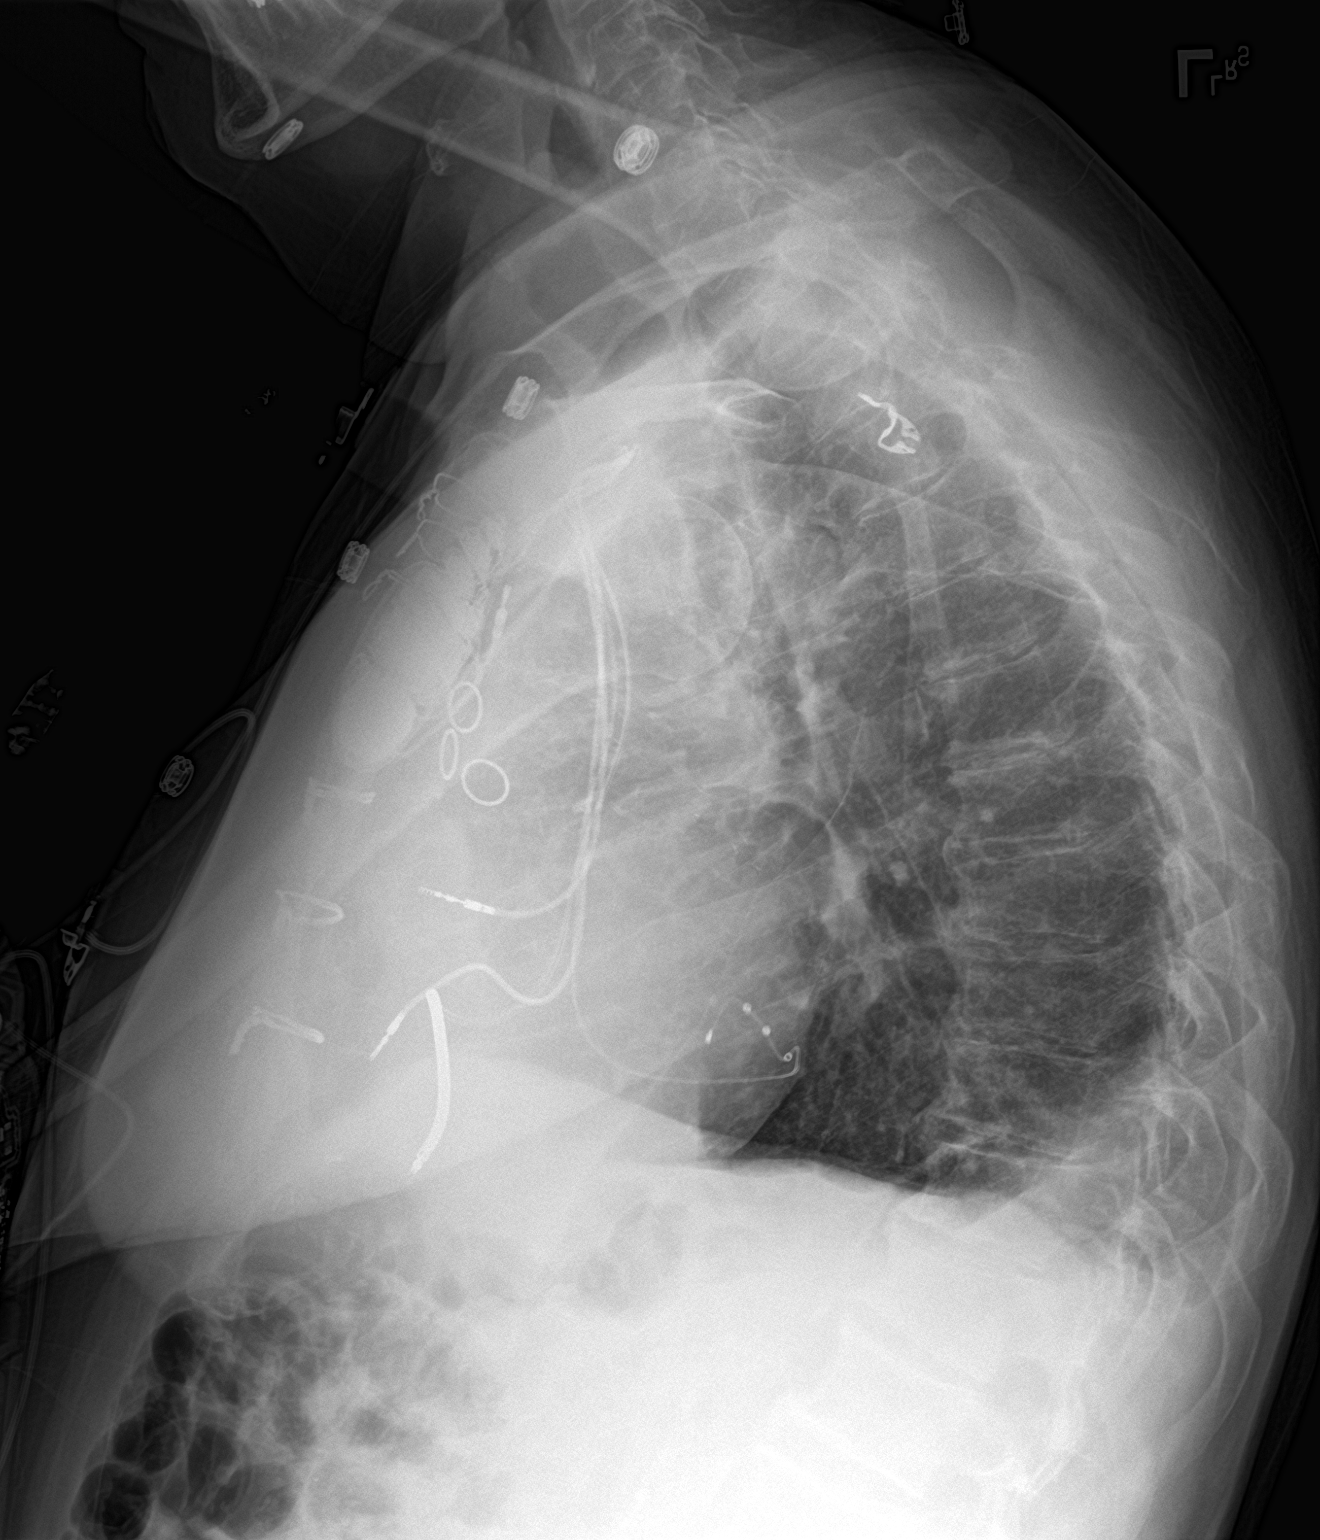

[2 of 2 positions shown; findings below may reference images not displayed]

FINDINGS: LEFT subclavian biventricular pacing defibrillator with the lead
tips at the expected locations of the RIGHT atrial appendage, RIGHT
ventricular apex and coronary sinus. No evidence of pneumothorax or
mediastinal hematoma.

Prior sternotomy for CABG. Cardiac silhouette mildly to moderately
enlarged. Hilar and mediastinal contours otherwise unremarkable.
Lungs clear. Bronchovascular markings normal. Pulmonary vascularity
normal. No visible pleural effusions. No pneumothorax. Degenerative
changes and DISH involving the thoracic spine.
IMPRESSION: 1. LEFT subclavian biventricular pacing defibrillator with the lead
tips at the expected locations of the RIGHT atrial appendage, RIGHT
ventricular apex and coronary sinus. No acute complicating features.
2. Cardiomegaly. No acute cardiopulmonary disease.

## 2020-08-14 DIAGNOSIS — G20 Parkinson's disease: Secondary | ICD-10-CM | POA: Diagnosis not present

## 2020-08-14 DIAGNOSIS — S4291XA Fracture of right shoulder girdle, part unspecified, initial encounter for closed fracture: Secondary | ICD-10-CM | POA: Diagnosis not present

## 2020-08-14 DIAGNOSIS — S42213A Unspecified displaced fracture of surgical neck of unspecified humerus, initial encounter for closed fracture: Secondary | ICD-10-CM | POA: Diagnosis not present

## 2020-08-14 DIAGNOSIS — Z8781 Personal history of (healed) traumatic fracture: Secondary | ICD-10-CM | POA: Diagnosis not present

## 2020-08-14 DIAGNOSIS — I509 Heart failure, unspecified: Secondary | ICD-10-CM | POA: Diagnosis not present

## 2020-08-14 DIAGNOSIS — Z9889 Other specified postprocedural states: Secondary | ICD-10-CM | POA: Diagnosis not present

## 2020-08-16 DIAGNOSIS — S4291XA Fracture of right shoulder girdle, part unspecified, initial encounter for closed fracture: Secondary | ICD-10-CM | POA: Diagnosis not present

## 2020-08-16 DIAGNOSIS — G20 Parkinson's disease: Secondary | ICD-10-CM | POA: Diagnosis not present

## 2020-08-16 DIAGNOSIS — I509 Heart failure, unspecified: Secondary | ICD-10-CM | POA: Diagnosis not present

## 2020-08-21 DIAGNOSIS — S4291XA Fracture of right shoulder girdle, part unspecified, initial encounter for closed fracture: Secondary | ICD-10-CM | POA: Diagnosis not present

## 2020-08-21 DIAGNOSIS — G20 Parkinson's disease: Secondary | ICD-10-CM | POA: Diagnosis not present

## 2020-08-21 DIAGNOSIS — I509 Heart failure, unspecified: Secondary | ICD-10-CM | POA: Diagnosis not present

## 2020-08-23 DIAGNOSIS — I509 Heart failure, unspecified: Secondary | ICD-10-CM | POA: Diagnosis not present

## 2020-08-23 DIAGNOSIS — S4291XA Fracture of right shoulder girdle, part unspecified, initial encounter for closed fracture: Secondary | ICD-10-CM | POA: Diagnosis not present

## 2020-08-23 DIAGNOSIS — G20 Parkinson's disease: Secondary | ICD-10-CM | POA: Diagnosis not present

## 2020-08-28 DIAGNOSIS — M25611 Stiffness of right shoulder, not elsewhere classified: Secondary | ICD-10-CM | POA: Diagnosis not present

## 2020-08-28 DIAGNOSIS — G20 Parkinson's disease: Secondary | ICD-10-CM | POA: Diagnosis not present

## 2020-08-28 DIAGNOSIS — M25621 Stiffness of right elbow, not elsewhere classified: Secondary | ICD-10-CM | POA: Diagnosis not present

## 2020-08-28 DIAGNOSIS — R2681 Unsteadiness on feet: Secondary | ICD-10-CM | POA: Diagnosis not present

## 2020-08-28 DIAGNOSIS — R2689 Other abnormalities of gait and mobility: Secondary | ICD-10-CM | POA: Diagnosis not present

## 2020-08-28 DIAGNOSIS — S4291XA Fracture of right shoulder girdle, part unspecified, initial encounter for closed fracture: Secondary | ICD-10-CM | POA: Diagnosis not present

## 2020-08-28 DIAGNOSIS — M25511 Pain in right shoulder: Secondary | ICD-10-CM | POA: Diagnosis not present

## 2020-08-28 DIAGNOSIS — M6281 Muscle weakness (generalized): Secondary | ICD-10-CM | POA: Diagnosis not present

## 2020-08-30 ENCOUNTER — Other Ambulatory Visit: Payer: Self-pay

## 2020-08-30 ENCOUNTER — Encounter: Payer: Self-pay | Admitting: Legal Medicine

## 2020-08-30 ENCOUNTER — Ambulatory Visit (INDEPENDENT_AMBULATORY_CARE_PROVIDER_SITE_OTHER): Payer: Medicare Other | Admitting: Legal Medicine

## 2020-08-30 VITALS — BP 120/58 | HR 94 | Temp 96.3°F | Resp 16 | Ht 68.0 in | Wt 151.0 lb

## 2020-08-30 DIAGNOSIS — E559 Vitamin D deficiency, unspecified: Secondary | ICD-10-CM | POA: Diagnosis not present

## 2020-08-30 DIAGNOSIS — I10 Essential (primary) hypertension: Secondary | ICD-10-CM

## 2020-08-30 DIAGNOSIS — E538 Deficiency of other specified B group vitamins: Secondary | ICD-10-CM

## 2020-08-30 DIAGNOSIS — Z950 Presence of cardiac pacemaker: Secondary | ICD-10-CM

## 2020-08-30 DIAGNOSIS — M8000XA Age-related osteoporosis with current pathological fracture, unspecified site, initial encounter for fracture: Secondary | ICD-10-CM

## 2020-08-30 DIAGNOSIS — E1159 Type 2 diabetes mellitus with other circulatory complications: Secondary | ICD-10-CM

## 2020-08-30 DIAGNOSIS — E1169 Type 2 diabetes mellitus with other specified complication: Secondary | ICD-10-CM | POA: Diagnosis not present

## 2020-08-30 DIAGNOSIS — I5042 Chronic combined systolic (congestive) and diastolic (congestive) heart failure: Secondary | ICD-10-CM | POA: Diagnosis not present

## 2020-08-30 DIAGNOSIS — I442 Atrioventricular block, complete: Secondary | ICD-10-CM

## 2020-08-30 DIAGNOSIS — G20 Parkinson's disease: Secondary | ICD-10-CM

## 2020-08-30 DIAGNOSIS — E782 Mixed hyperlipidemia: Secondary | ICD-10-CM

## 2020-08-30 DIAGNOSIS — I152 Hypertension secondary to endocrine disorders: Secondary | ICD-10-CM

## 2020-08-30 DIAGNOSIS — Z794 Long term (current) use of insulin: Secondary | ICD-10-CM

## 2020-08-30 DIAGNOSIS — E669 Obesity, unspecified: Secondary | ICD-10-CM

## 2020-08-30 DIAGNOSIS — I2510 Atherosclerotic heart disease of native coronary artery without angina pectoris: Secondary | ICD-10-CM

## 2020-08-30 NOTE — Progress Notes (Signed)
Subjective:  Patient ID: Jeffrey Parsons, male    DOB: [birth year]  Age: 70 y.o. MRN: [id number]  Chief Complaint  Patient presents with  . Hyperlipidemia  . Diabetes  . Hypertension    HPI: chronic visit  Patient present with type 2 diabetes.  Specifically, this is type 2, insulin requiring diabetes, complicated by hypertension and hypercolesterolemia.  Compliance with treatment has been good; patient take medicines as directed, maintains diet and exercise regimen, follows up as directed, and is keeping glucose diary.  Date of  diagnosis 2010.  Depression screen has been performed.Tobacco screen nonsmoker. Current medicines for diabetes Lantus 26 uits a day, metformin.  Patient is on valsartan for renal protection and crestor for cholesterol control.  Patient performs foot exams daily and last ophthalmologic exam was yes  Parkinsons worse since right shoulder fracture and is in Pt now.  Patient presents for follow up of hypertension.  Patient tolerating valsartan, carvedilol well with side effects.  Patient was diagnosed with hypertension 010 so has been treated for hypertension for 10 years.Patient is working on maintaining diet and exercise regimen and follows up as directed. Complication include CAD.[name].   Patient presents with hyperlipidemia.  Compliance with treatment has been good; patient takes medicines as directed, maintains low cholesterol diet, follows up as directed, and maintains exercise regimen.  Patient is using crestor without problems. Current Outpatient Medications on File Prior to Visit  Medication Sig Dispense Refill  . acetaminophen (TYLENOL) 500 MG tablet Take 500 mg by mouth daily as needed for moderate pain or headache.     . albuterol (VENTOLIN HFA) 108 (90 Base) MCG/ACT inhaler Inhale 2 puffs into the lungs every 6 (six) hours as needed for wheezing or shortness of breath.    [name]. aspirin EC 81 MG tablet Take 1 tablet (81 mg total) by mouth daily.    .  carbidopa-levodopa (SINEMET CR) 50-200 MG tablet Take 1 tablet by mouth every evening.     . carbidopa-levodopa (SINEMET IR) 25-250 MG tablet Take 1 tablet by mouth 4 (four) times daily.     . carvedilol (COREG) 12.5 MG tablet Take 1 tablet (12.5 mg total) by mouth 2 (two) times daily. 180 tablet 3  . Continuous Blood Gluc Sensor (DEXCOM G6 SENSOR) MISC 1 each by Does not apply route once a week. 9 each 4  . Continuous Blood Gluc Sensor (FREESTYLE LIBRE 2 SENSOR) MISC by Does not apply route.    . insulin glargine (LANTUS) 100 UNIT/ML Solostar Pen Inject 26 Units into the skin daily.    . Insulin Pen Needle (PEN NEEDLES 5/16") 30G X 8 MM MISC by Does not apply route.    . metFORMIN (GLUCOPHAGE) 500 MG tablet Take 2 tablets (1,000 mg total) by mouth 2 (two) times daily with a meal. Start this with new insulin 360 tablet 2  . Multiple Vitamin (MULTIVITAMIN) tablet Take 1 tablet by mouth daily.    . rosuvastatin (CRESTOR) 10 MG tablet Take 10 mg by mouth daily.    . sacubitril-valsartan (ENTRESTO) 49-51 MG Take 1 tablet by mouth 2 (two) times daily. 180 tablet 3  . topiramate (TOPAMAX) 50 MG tablet Take 50 mg by mouth 2 (two) times daily.     No current facility-administered medications on file prior to visit.   Past Medical History:  Diagnosis Date  . AICD (automatic cardioverter/defibrillator) present   . Complete heart block (HCC) 05/26/2017  . Coronary artery disease involving native coronary artery of native heart without  angina pectoris 09/12/2016  . Ischemic cardiomyopathy 05/26/2017  . Pneumonia 2019  . Retinopathy    Past Surgical History:  Procedure Laterality Date  . BI-VENTRICULAR IMPLANTABLE CARDIOVERTER DEFIBRILLATOR UPGRADE  06/25/2018  . BIV UPGRADE N/A 06/25/2018   Procedure: BIV ICD UPGRADE;  Surgeon: Duke Salvia, MD;  Location: [facility] INVASIVE CV LAB;  Service: Cardiovascular;  Laterality: N/A;  . CARDIAC CATHETERIZATION  2001; ~ 2013  . CATARACT EXTRACTION W/ INTRAOCULAR  LENS  IMPLANT, BILATERAL Bilateral   . CORONARY ARTERY BYPASS GRAFT  2001   "CABG X5"  . INSERT / REPLACE / REMOVE PACEMAKER  05/2010  . KNEE SURGERY Left    AS A CHILD  . R shoulder surgery  05/2020  . RIGHT/LEFT HEART CATH AND CORONARY/GRAFT ANGIOGRAPHY N/A 06/16/2017   Procedure: RIGHT/LEFT HEART CATH AND CORONARY/GRAFT ANGIOGRAPHY;  Surgeon: Tonny Bollman, MD;  Location: [facility] INVASIVE CV LAB;  Service: Cardiovascular;  Laterality: N/A;  . TONSILLECTOMY      Family History  Problem Relation Age of Onset  . Bronchitis Mother   . Coronary artery disease Father   . Heart disease Father   . Hypertension Sister   . Hypertension Brother    Social History   Socioeconomic History  . Marital status: Married    Spouse name: Not on file  . Number of children: Not on file  . Years of education: Not on file  . Highest education level: Not on file  Occupational History  . Not on file  Tobacco Use  . Smoking status: Never Smoker  . Smokeless tobacco: Never Used  Vaping Use  . Vaping Use: Never used  Substance and Sexual Activity  . Alcohol use: Never  . Drug use: Never  . Sexual activity: Yes  Other Topics Concern  . Not on file  Social History Narrative  . Not on file   Social Determinants of Health   Financial Resource Strain: Not on file  Food Insecurity: No Food Insecurity  . Worried About Programme researcher, broadcasting/film/video in the Last Year: Never true  . Ran Out of Food in the Last Year: Never true  Transportation Needs: No Transportation Needs  . Lack of Transportation (Medical): No  . Lack of Transportation (Non-Medical): No  Physical Activity: Insufficiently Active  . Days of Exercise per Week: 3 days  . Minutes of Exercise per Session: 30 min  Stress: Not on file  Social Connections: Socially Integrated  . Frequency of Communication with Friends and Family: More than three times a week  . Frequency of Social Gatherings with Friends and Family: Twice a week  . Attends  Religious Services: More than 4 times per year  . Active Member of Clubs or Organizations: Yes  . Attends Banker Meetings: More than 4 times per year  . Marital Status: Married    Review of Systems  Constitutional: Negative for activity change and appetite change.  Eyes: Negative.   Respiratory: Negative for chest tightness and shortness of breath.   Cardiovascular: Negative for chest pain and leg swelling.  Gastrointestinal: Negative for abdominal distention and abdominal pain.  Endocrine: Positive for polyuria.  Genitourinary: Negative for difficulty urinating and urgency.  Musculoskeletal: Positive for arthralgias and gait problem.       Trouble walking  Skin: Negative.   Neurological: Positive for tremors and weakness.       Parkinson  Psychiatric/Behavioral: Negative.      Objective:  BP (!) 120/58 (BP Location: Right Arm, Patient Position:  Sitting, Cuff Size: Normal)   Pulse 94   Temp (!) 96.3 F (35.7 C) (Temporal)   Resp 16   Ht 5\' 8"  (1.727 m)   Wt 151 lb (68.5 kg)   SpO2 97%   BMI 22.96 kg/m   BP/Weight 08/30/2020 07/30/2020 05/22/2020  Systolic BP 120 118 124  Diastolic BP 58 62 56  Wt. (Lbs) 151 155 152.2  BMI 22.96 23.57 23.14    Physical Exam Vitals reviewed.  Constitutional:      General: He is not in acute distress.    Appearance: Normal appearance.  Cardiovascular:     Rate and Rhythm: Normal rate and regular rhythm.     Pulses: Normal pulses.     Heart sounds: Normal heart sounds. No murmur heard. No gallop.   Pulmonary:     Effort: Pulmonary effort is normal.     Breath sounds: Normal breath sounds. No rales.  Musculoskeletal:     Cervical back: Normal range of motion and neck supple.     Comments: Generalized weakness, pain right shoulder from fracture- working with PT  Skin:    General: Skin is warm and dry.     Capillary Refill: Capillary refill takes less than 2 seconds.  Neurological:     General: No focal deficit  present.     Mental Status: He is alert and oriented to person, place, and time. Mental status is at baseline.     Motor: Weakness and tremor present.     Gait: Gait abnormal.     Deep Tendon Reflexes: Reflexes abnormal.     Reflex Scores:      Bicep reflexes are 2+ on the right side and 2+ on the left side.      Brachioradialis reflexes are 2+ on the right side and 2+ on the left side.      Patellar reflexes are 0 on the right side and 0 on the left side.      Achilles reflexes are 0 on the right side and 0 on the left side.    Comments: Mask like facies, pill rolling tremor, cogwheel ridigity       Lab Results  Component Value Date   WBC 7.0 03/29/2020   HGB 12.8 (L) 03/29/2020   HCT 38.1 (L) 03/29/2020   PLT 176 03/29/2020   GLUCOSE 358 (H) 05/03/2020   CHOL 182 05/03/2020   TRIG 93 05/03/2020   HDL 44 05/03/2020   LDLCALC 121 (H) 05/03/2020   ALT 2 05/03/2020   AST 14 05/03/2020   NA 137 05/03/2020   K 5.0 05/03/2020   CL 102 05/03/2020   CREATININE 1.01 05/03/2020   BUN 22 05/03/2020   CO2 22 05/03/2020   TSH 1.440 05/22/2020   HGBA1C 8.6 (H) 05/07/2020   MICROALBUR 80 01/17/2020      Assessment & Plan:  Diagnoses and all orders for this visit: Type 2 diabetes mellitus with other specified complication, unspecified whether long term insulin use (HCC) -     Hemoglobin A1c An individual care plan for diabetes was established and reinforced today.  The patient's status was assessed using clinical findings on exam, labs and diagnostic testing. Patient success at meeting goals based on disease specific evidence-based guidelines and found to be fair controlled. Medications were assessed and patient's understanding of the medical issues , including barriers were assessed. Recommend adherence to a diabetic diet, a graduated exercise program, HgbA1c level is checked quarterly, and urine microalbumin performed yearly .  Annual  mono-filament sensation testing performed.  Lower blood pressure and control hyperlipidemia is important. Get annual eye exams and annual flu shots and smoking cessation discussed.  Self management goals were discussed.  Mixed hyperlipidemia -     Lipid panel AN INDIVIDUAL CARE PLAN for hyperlipidemia/ cholesterol was established and reinforced today.  The patient's status was assessed using clinical findings on exam, lab and other diagnostic tests. The patient's disease status was assessed based on evidence-based guidelines and found to be fair controlled. MEDICATIONS were reviewed. SELF MANAGEMENT GOALS have been discussed and patient's success at attaining the goal of low cholesterol was assessed. RECOMMENDATION given include regular exercise 3 days a week and low cholesterol/low fat diet. CLINICAL SUMMARY including written plan to identify barriers unique to the patient due to social or economic  reasons was discussed.  Essential hypertension -     CBC with Differential/Platelet -     Comprehensive metabolic panel An individual hypertension care plan was established and reinforced today.  The patient's status was assessed using clinical findings on exam and labs or diagnostic tests. The patient's success at meeting treatment goals on disease specific evidence-based guidelines and found to be well controlled. SELF MANAGEMENT: The patient and I together assessed ways to personally work towards obtaining the recommended goals. RECOMMENDATIONS: avoid decongestants found in common cold remedies, decrease consumption of alcohol, perform routine monitoring of BP with home BP cuff, exercise, reduction of dietary salt, take medicines as prescribed, try not to miss doses and quit smoking.  Regular exercise and maintaining a healthy weight is needed.  Stress reduction may help. A CLINICAL SUMMARY including written plan identify barriers to care unique to individual due to social or financial issues.  We attempt to mutually creat solutions for  individual and family understanding.  Parkinson's disease [facility]) Patient has chronic parkinson's disease and remains under control with medicines.  No dementia or dysphagia.  Chronic combined systolic and diastolic CHF (congestive heart failure) (HCC) An individualized care plan was established and reinforced.  The patient's disease status was assessed using clinical finding son exam today, labs, and/or other diagnostic testing such as x-rays, to determine the patient's success in meeting treatmentgoalsbased on disease-based guidelines and found to beimproving. But not at goal yet. Medications prescriptions no changes Laboratory tests ordered to be performed today include routine. RECOMMENDATIONS: given include cardiology.  Call physician is patient gains 3 lbs in one day or 5 lbs for one week.  Call for progressive PND, orthopnea or increased pedal edema.  Coronary artery disease involving native coronary artery of native heart without angina pectoris Patient's CAD was assessed using history and physical along with other information to maximize treatment.  Evidence based criteria was use in deciding proper management for this disease process.  Patient's CAD is under good control.therapy continue present treatment . Obesity, diabetes, and hypertension syndrome (HCC) An individual care plan for diabetes was established and reinforced today.  The patient's status was assessed using clinical findings on exam, labs and diagnostic testing. Patient success at meeting goals based on disease specific evidence-based guidelines and found to be fair controlled. Medications were assessed and patient's understanding of the medical issues , including barriers were assessed. Recommend adherence to a diabetic di  B12 deficiency AN INDIVIDUAL CARE PLAN for B12 deficiency was established and reinforced today.  The patient's status was assessed using clinical findings on exam, labs, and other diagnostic testing.  Patient's success at meeting treatment goals based on disease specific evidence-bassed guidelines and found  to be in good control. RECOMMENDATIONS include maintaining present medicines and treatment.  Vitamin D deficiency AN INDIVIDUAL CARE PLAN vitamin D deficiency was established and reinforced today.  The patient's status was assessed using clinical findings on exam, labs, and other diagnostic testing. Patient's success at meeting treatment goals based on disease specific evidence-bassed guidelines and found to be in good control. RECOMMENDATIONS include maintining present medicines and treatment.  Long term (current) use of insulin (HCC) Patient is on insulin for DM  Complete heart block [facility]) Patient has complete heart block and has a pacemaker  Pacemaker Pacemaker working for Complete heart block. Patient also has a implanted defibrillator  Osteoporosis with current pathological fracture, unspecified osteoporosis type, initial encounter -     DG DXA BODY COMPOSITION Recent right shoulder fracture- he needs DEXA         I spent 35 minutes dedicated to the care of this patient on the date of this encounter to include face-to-face time with the patient, as well as: records review  Follow-up: Return in about 4 months (around 12/30/2020) for fasting.  An After Visit Summary was printed and given to the patient.  [name], MD [facility] [phone]

## 2020-08-31 DIAGNOSIS — S4291XA Fracture of right shoulder girdle, part unspecified, initial encounter for closed fracture: Secondary | ICD-10-CM | POA: Diagnosis not present

## 2020-08-31 DIAGNOSIS — M6281 Muscle weakness (generalized): Secondary | ICD-10-CM | POA: Diagnosis not present

## 2020-08-31 DIAGNOSIS — M25621 Stiffness of right elbow, not elsewhere classified: Secondary | ICD-10-CM | POA: Diagnosis not present

## 2020-08-31 DIAGNOSIS — G20 Parkinson's disease: Secondary | ICD-10-CM | POA: Diagnosis not present

## 2020-08-31 DIAGNOSIS — M25511 Pain in right shoulder: Secondary | ICD-10-CM | POA: Diagnosis not present

## 2020-08-31 DIAGNOSIS — M25611 Stiffness of right shoulder, not elsewhere classified: Secondary | ICD-10-CM | POA: Diagnosis not present

## 2020-08-31 LAB — LIPID PANEL
Chol/HDL Ratio: 2.6 ratio (ref 0.0–5.0)
Cholesterol, Total: 121 mg/dL (ref 100–199)
HDL: 46 mg/dL (ref >39)
LDL Chol Calc (NIH): 61 mg/dL (ref 0–99)
Triglycerides: 67 mg/dL (ref 0–149)
VLDL Cholesterol Cal: 14 mg/dL (ref 5–40)

## 2020-08-31 LAB — COMPREHENSIVE METABOLIC PANEL
ALT: 4 IU/L (ref 0–44)
AST: 14 IU/L (ref 0–40)
Albumin/Globulin Ratio: 1.9 (ref 1.2–2.2)
Albumin: 4.1 g/dL (ref 3.8–4.8)
Alkaline Phosphatase: 106 IU/L (ref 44–121)
BUN/Creatinine Ratio: 26 — ABNORMAL HIGH (ref 10–24)
BUN: 22 mg/dL (ref 8–27)
Bilirubin Total: 0.7 mg/dL (ref 0.0–1.2)
CO2: 19 mmol/L — ABNORMAL LOW (ref 20–29)
Calcium: 9.1 mg/dL (ref 8.6–10.2)
Chloride: 102 mmol/L (ref 96–106)
Creatinine, Ser: 0.86 mg/dL (ref 0.76–1.27)
Globulin, Total: 2.2 g/dL (ref 1.5–4.5)
Glucose: 335 mg/dL — ABNORMAL HIGH (ref 65–99)
Potassium: 5 mmol/L (ref 3.5–5.2)
Sodium: 136 mmol/L (ref 134–144)
Total Protein: 6.3 g/dL (ref 6.0–8.5)
eGFR: 93 mL/min/{1.73_m2} (ref >59)

## 2020-08-31 LAB — CBC WITH DIFFERENTIAL/PLATELET
Basophils Absolute: 0.1 10*3/uL (ref 0.0–0.2)
Basos: 1 %
EOS (ABSOLUTE): 0.4 10*3/uL (ref 0.0–0.4)
Eos: 5 %
Hematocrit: 40.4 % (ref 37.5–51.0)
Hemoglobin: 13.2 g/dL (ref 13.0–17.7)
Immature Grans (Abs): 0 10*3/uL (ref 0.0–0.1)
Immature Granulocytes: 0 %
Lymphocytes Absolute: 1.3 10*3/uL (ref 0.7–3.1)
Lymphs: 18 %
MCH: 30.9 pg (ref 26.6–33.0)
MCHC: 32.7 g/dL (ref 31.5–35.7)
MCV: 95 fL (ref 79–97)
Monocytes Absolute: 0.4 10*3/uL (ref 0.1–0.9)
Monocytes: 6 %
Neutrophils Absolute: 5 10*3/uL (ref 1.4–7.0)
Neutrophils: 70 %
Platelets: 160 10*3/uL (ref 150–450)
RBC: 4.27 x10E6/uL (ref 4.14–5.80)
RDW: 12.1 % (ref 11.6–15.4)
WBC: 7.1 10*3/uL (ref 3.4–10.8)

## 2020-08-31 LAB — HEMOGLOBIN A1C
Est. average glucose Bld gHb Est-mCnc: 177 mg/dL
Hgb A1c MFr Bld: 7.8 % — ABNORMAL HIGH (ref 4.8–5.6)

## 2020-08-31 LAB — CARDIOVASCULAR RISK ASSESSMENT

## 2020-08-31 NOTE — Progress Notes (Signed)
No changes, take crestor lp

## 2020-08-31 NOTE — Progress Notes (Signed)
Cbc normal, glucose 335, kidney tests normal, liver test normal, LDL cholesterol high at 121, A1c 7.8 OK, recommend statin lp

## 2020-09-03 DIAGNOSIS — G250 Essential tremor: Secondary | ICD-10-CM | POA: Diagnosis not present

## 2020-09-03 DIAGNOSIS — G20 Parkinson's disease: Secondary | ICD-10-CM | POA: Diagnosis not present

## 2020-09-04 DIAGNOSIS — G20 Parkinson's disease: Secondary | ICD-10-CM | POA: Diagnosis not present

## 2020-09-04 DIAGNOSIS — M25611 Stiffness of right shoulder, not elsewhere classified: Secondary | ICD-10-CM | POA: Diagnosis not present

## 2020-09-04 DIAGNOSIS — M6281 Muscle weakness (generalized): Secondary | ICD-10-CM | POA: Diagnosis not present

## 2020-09-04 DIAGNOSIS — M25621 Stiffness of right elbow, not elsewhere classified: Secondary | ICD-10-CM | POA: Diagnosis not present

## 2020-09-04 DIAGNOSIS — M25511 Pain in right shoulder: Secondary | ICD-10-CM | POA: Diagnosis not present

## 2020-09-04 DIAGNOSIS — S4291XA Fracture of right shoulder girdle, part unspecified, initial encounter for closed fracture: Secondary | ICD-10-CM | POA: Diagnosis not present

## 2020-09-06 DIAGNOSIS — M25621 Stiffness of right elbow, not elsewhere classified: Secondary | ICD-10-CM | POA: Diagnosis not present

## 2020-09-06 DIAGNOSIS — M25611 Stiffness of right shoulder, not elsewhere classified: Secondary | ICD-10-CM | POA: Diagnosis not present

## 2020-09-06 DIAGNOSIS — M6281 Muscle weakness (generalized): Secondary | ICD-10-CM | POA: Diagnosis not present

## 2020-09-06 DIAGNOSIS — G20 Parkinson's disease: Secondary | ICD-10-CM | POA: Diagnosis not present

## 2020-09-06 DIAGNOSIS — M25511 Pain in right shoulder: Secondary | ICD-10-CM | POA: Diagnosis not present

## 2020-09-06 DIAGNOSIS — S4291XA Fracture of right shoulder girdle, part unspecified, initial encounter for closed fracture: Secondary | ICD-10-CM | POA: Diagnosis not present

## 2020-09-11 ENCOUNTER — Ambulatory Visit (INDEPENDENT_AMBULATORY_CARE_PROVIDER_SITE_OTHER): Payer: Medicare Other

## 2020-09-11 ENCOUNTER — Other Ambulatory Visit: Payer: Self-pay

## 2020-09-11 DIAGNOSIS — G20 Parkinson's disease: Secondary | ICD-10-CM | POA: Diagnosis not present

## 2020-09-11 DIAGNOSIS — E1169 Type 2 diabetes mellitus with other specified complication: Secondary | ICD-10-CM

## 2020-09-11 DIAGNOSIS — I2510 Atherosclerotic heart disease of native coronary artery without angina pectoris: Secondary | ICD-10-CM

## 2020-09-11 DIAGNOSIS — E782 Mixed hyperlipidemia: Secondary | ICD-10-CM | POA: Diagnosis not present

## 2020-09-11 DIAGNOSIS — M6281 Muscle weakness (generalized): Secondary | ICD-10-CM | POA: Diagnosis not present

## 2020-09-11 DIAGNOSIS — S4291XA Fracture of right shoulder girdle, part unspecified, initial encounter for closed fracture: Secondary | ICD-10-CM | POA: Diagnosis not present

## 2020-09-11 DIAGNOSIS — M25611 Stiffness of right shoulder, not elsewhere classified: Secondary | ICD-10-CM | POA: Diagnosis not present

## 2020-09-11 DIAGNOSIS — M25511 Pain in right shoulder: Secondary | ICD-10-CM | POA: Diagnosis not present

## 2020-09-11 DIAGNOSIS — M25621 Stiffness of right elbow, not elsewhere classified: Secondary | ICD-10-CM | POA: Diagnosis not present

## 2020-09-11 NOTE — Progress Notes (Signed)
Chronic Care Management Pharmacy Note  09/13/2020 Name:  Jeffrey Parsons MRN:  [id number] DOB:  07-31-1950   Plan Recommendations:   Reviewed patient recent blood sugars. Patient is very sensitive to low blood sugar and slight insulin adjustments. Patient's wife is assisting him with monitoring closely. Patient currently tolerating ~19 units of Lantus but blood sugars still fluctuate. Pharmacist encouraged patient to consider Wilder Glade with goal of decreasing insulin dose. Patient is tolerating metformin well at this time. Patient and his wife will look up medication and contact pharmacist with their decision. Pharmacist to coordinate patient assistance if needed.   Subjective: Jeffrey Parsons is an 70 y.o. year old male who is a primary patient of [name], Zeb Comfort, MD.  The CCM team was consulted for assistance with disease management and care coordination needs.    Engaged with patient by telephone for follow up visit in response to provider referral for pharmacy case management and/or care coordination services.   Consent to Services:  The patient was given information about Chronic Care Management services, agreed to services, and gave verbal consent prior to initiation of services.  Please see initial visit note for detailed documentation.   Patient Care Team: [name], MD as PCP - General (Family Medicine) Nahser, Wonda Cheng, MD as PCP - Cardiology (Cardiology) Deboraha Sprang, MD as PCP - Electrophysiology (Cardiology) Burnice Logan, [facility] (Pharmacist)  Recent office visits: [phone] - no changes. Continue Crestor.   Recent consult visits: 09/03/2020 - neuro - refer to PT for PD and balance. Reduce topiramate to 1 tab in the morning. After 2 weeks if no worsening of your tremor, please stop taking altogether. Continue to exercise. Consider lowering bp medications as much as possible. People with PD have low bp and are sensitive to low bp.    Hospital  visits:   Objective:  Lab Results  Component Value Date   CREATININE 0.86 08/30/2020   BUN 22 08/30/2020   GFRNONAA 76 05/03/2020   GFRAA 87 05/03/2020   NA 136 08/30/2020   K 5.0 08/30/2020   CALCIUM 9.1 08/30/2020   CO2 19 (L) 08/30/2020    Lab Results  Component Value Date/Time   HGBA1C 7.8 (H) 08/30/2020 08:47 AM   HGBA1C 8.6 (H) 05/07/2020 10:38 AM   MICROALBUR 80 01/17/2020 08:38 AM    Last diabetic Eye exam: No results found for: HMDIABEYEEXA  Last diabetic Foot exam: No results found for: HMDIABFOOTEX   Lab Results  Component Value Date   CHOL 121 08/30/2020   HDL 46 08/30/2020   LDLCALC 61 08/30/2020   TRIG 67 08/30/2020   CHOLHDL 2.6 08/30/2020    Hepatic Function Latest Ref Rng & Units 08/30/2020 05/03/2020 01/17/2020  Total Protein 6.0 - 8.5 g/dL 6.3 6.5 6.8  Albumin 3.8 - 4.8 g/dL 4.1 4.4 4.1  AST 0 - 40 IU/L 14 14 15   ALT 0 - 44 IU/L 4 2 4   Alk Phosphatase 44 - 121 IU/L 106 121 114  Total Bilirubin 0.0 - 1.2 mg/dL 0.7 1.0 0.7  Bilirubin, Direct 0.00 - 0.40 mg/dL - 0.23 -    Lab Results  Component Value Date/Time   TSH 1.440 05/22/2020 08:49 AM    CBC Latest Ref Rng & Units 08/30/2020 03/29/2020 01/17/2020  WBC 3.4 - 10.8 x10E3/uL 7.1 7.0 8.2  Hemoglobin 13.0 - 17.7 g/dL 13.2 12.8(L) 13.7  Hematocrit 37.5 - 51.0 % 40.4 38.1(L) 40.7  Platelets 150 - 450 x10E3/uL 160 176 165  No results found for: VD25OH  Clinical ASCVD: Yes  The ASCVD Risk score Mikey Bussing DC Jr., et al., 2013) failed to calculate for the following reasons:   The patient has a prior MI or stroke diagnosis    Depression screen [facility] ([facility]) 2/9 01/17/2020  Decreased Interest 0  Down, Depressed, Hopeless 0  PHQ - 2 Score 0  Altered sleeping 0  Tired, decreased energy 1  Change in appetite 0  Feeling bad or failure about yourself  0  Trouble concentrating 0  Moving slowly or fidgety/restless 1  Suicidal thoughts 0  PHQ-9 Score 2  Difficult doing work/chores Not difficult at all      Other: (CHADS2VASc if Afib, MMRC or CAT for COPD, ACT, DEXA)  Social History   Tobacco Use  Smoking Status Never Smoker  Smokeless Tobacco Never Used   BP Readings from Last 3 Encounters:  08/30/20 (!) 120/58  07/30/20 118/62  05/22/20 (!) 124/56   Pulse Readings from Last 3 Encounters:  08/30/20 94  07/30/20 92  05/22/20 (!) 104   Wt Readings from Last 3 Encounters:  08/30/20 151 lb (68.5 kg)  07/30/20 155 lb (70.3 kg)  05/22/20 152 lb 3.2 oz (69 kg)    Assessment/Interventions: Review of patient past medical history, allergies, medications, health status, including review of consultants reports, laboratory and other test data, was performed as part of comprehensive evaluation and provision of chronic care management services.   SDOH:  (Social Determinants of Health) assessments and interventions performed: Yes   CCM Care Plan  Allergies  Allergen Reactions  . Onion     Upset stomach  . Sulfa Antibiotics Other (See Comments)    FLUSH     Medications Reviewed Today    Reviewed by Burnice Logan, [facility] (Pharmacist) on 09/11/20 at 1518  Med List Status: <None>  Medication Order Taking? Sig Documenting Provider Last Dose Status Informant  acetaminophen (TYLENOL) 500 MG tablet [id number] Yes Take 500 mg by mouth daily as needed for moderate pain or headache.  [provider] Taking Active Multiple Informants  albuterol (VENTOLIN HFA) 108 (90 Base) MCG/ACT inhaler [id number] Yes Inhale 2 puffs into the lungs every 6 (six) hours as needed for wheezing or shortness of breath. [provider] Taking Active Multiple Informants  aspirin EC 81 MG tablet [id number] Yes Take 1 tablet (81 mg total) by mouth daily. Richardson Dopp T, PA-C Taking Active Multiple Informants  carbidopa-levodopa (SINEMET CR) 50-200 MG tablet [id number] Yes Take 1 tablet by mouth every evening.  [provider] Taking Active Multiple Informants  carbidopa-levodopa (SINEMET IR) 25-250  MG tablet [id number] Yes Take 1 tablet by mouth 4 (four) times daily.  [provider] Taking Active Multiple Informants  carvedilol (COREG) 12.5 MG tablet [id number] Yes Take 1 tablet (12.5 mg total) by mouth 2 (two) times daily. Nahser, Wonda Cheng, MD Taking Active   Continuous Blood Gluc Sensor (DEXCOM G6 SENSOR) MISC [id number]  1 each by Does not apply route once a week. [name], MD  Consider Medication Status and Discontinue Multiple Informants  Continuous Blood Gluc Sensor ([place]) MISC [id number] Yes by Does not apply route. [provider] Taking Active Multiple Informants  insulin glargine (LANTUS) 100 UNIT/ML Solostar Pen [id number] Yes Inject 19 Units into the skin daily. [provider] Taking Active   Insulin Pen Needle (PEN NEEDLES 5/16") 30G X 8 MM MISC [id number] Yes by Does not apply route. [provider] Taking Active Multiple Informants  metFORMIN (GLUCOPHAGE) 500 MG tablet [id number] Yes Take 2 tablets (1,000 mg total) by mouth 2 (two) times daily with a meal. Start this with new insulin [name], MD Taking Active   Multiple Vitamin (MULTIVITAMIN) tablet [id number] Yes Take 1 tablet by mouth daily. [provider] Taking Active Multiple Informants  rosuvastatin (CRESTOR) 10 MG tablet [id number] Yes Take 10 mg by mouth daily. [provider] Taking Active   sacubitril-valsartan (ENTRESTO) 49-51 MG [id number] Yes Take 1 tablet by mouth 2 (two) times daily. Nahser, Wonda Cheng, MD Taking Active Multiple Informants  topiramate (TOPAMAX) 50 MG tablet [id number] Yes Take 50 mg by mouth daily. [provider] Taking Active Multiple Informants          Patient Active Problem List   Diagnosis Date Noted  . Fatigue 05/22/2020  . BPH with obstruction/lower urinary tract symptoms 05/22/2020  . BMI 23.0-23.9, adult 01/17/2020  . Sick sinus syndrome ([place]) 09/13/2019  . Obesity, diabetes, and  hypertension syndrome ([place]) 09/13/2019  . Secondary dystonia 07/04/2019  . Essential hypertension 06/26/2018  . Complete heart block ([place]) 05/26/2017  . Cardiac device in situ 05/26/2017  . Chronic combined systolic and diastolic CHF (congestive heart failure) ([place]) 05/26/2017  . Ischemic cardiomyopathy 05/26/2017  . Allergy to sulfa drugs 12/04/2016  . S/P CABG x 5 09/12/2016  . Mixed hyperlipidemia 09/12/2016  . Coronary artery disease involving native coronary artery of native heart without angina pectoris 09/12/2016  . Pacemaker 06/06/2016  . Long term (current) use of aspirin 12/06/2015  . Mild obesity 12/06/2015  . Other long term (current) drug therapy 12/06/2015  . Mild intermittent asthma with acute exacerbation 12/07/2014  . Parkinson's disease ([place]) 12/07/2014    Immunization History  Administered Date(s) Administered  . Moderna Sars-Covid-2 Vaccination 08/03/2019, 08/31/2019    Conditions to be addressed/monitored:  Hyperlipidemia and Diabetes  Care Plan : [place]  Updates made by Burnice Logan, [place] since 09/13/2020 12:00 AM    Problem: CAD, Diabetes, Hypertension   Priority: High  Onset Date: 08/07/2020    Long-Range Goal: Disease Management   Start Date: 08/07/2020  Expected End Date: 08/07/2021  Recent Progress: On track  Priority: High  Note:   Current Barriers:  . Unable to achieve control of Diabetes   Pharmacist Clinical Goal(s):  [name] Patient will achieve control of diabetes as evidenced by a1c and blood sugar readings through collaboration with PharmD and provider.   Interventions: . 1:1 collaboration with [name], MD regarding development and update of comprehensive plan of care as evidenced by provider attestation and co-signature . Inter-disciplinary care team collaboration (see longitudinal plan of care) . Comprehensive medication review performed; medication list updated in electronic medical record  Hyperlipidemia:  (LDL goal < 55) -Not ideally controlled -Current treatment: .  rosuvastatin 10 mg daily  -Medications previously tried: atorvastatin  -Current dietary patterns: eating healthy and trying to adhere to diabetic diet -Current exercise habits: [employer] 5 days a week normally and PT 2 days weekly for shoulder. Wife broke her arm and unable to get to gym currently.  -Educated on Cholesterol goals;  Benefits of statin for ASCVD risk reduction; Importance of limiting foods high in cholesterol; Exercise goal of 150 minutes per week; -Counseled on diet and exercise extensively Recommended to continue current medication  Diabetes (A1c goal <7%) -Uncontrolled -Current medications: . Lantus 19 units into the skin daily  . Metformin 1000 mg bid  . Freestyle Libre . Insulin pen  needles  -Medications previously tried: 70/30 insulin  -Current home glucose readings . fasting glucose: 151-300 . post prandial glucose: 50-400 -Reports hypoglycemic/hyperglycemic symptoms -Current meal patterns:  . breakfast: eggs . lunch: trying to eat balanced meals . dinner: meat and vegetables . snacks: ensure/boost . drinks: juice to correct low blood sugar -Current exercise:  [employer] m-f when wife can drive and pt for shoulder -Educated on A1c and blood sugar goals; Complications of diabetes including kidney damage, retinal damage, and cardiovascular disease; Exercise goal of 150 minutes per week; Prevention and management of hypoglycemic episodes; Continuous glucose monitoring; -Counseled to check feet daily and get yearly eye exams -Counseled on diet and exercise extensively Recommended patient consider Wilder Glade for improved blood sugar management, goal of reducing insulin dose and kidney protection. Provided patient and wife with the name of the medication so they can review. Patient and wife reports they will let pharmacist know if they decide to proceed with [place]. Pharmacist will coordinate  patient assistance at that time.   Patient Goals/Self-Care Activities . Patient will:  - take medications as prescribed focus on medication adherence by using pill box check glucose throughout the day with [facility], document, and provide at future appointments target a minimum of 150 minutes of moderate intensity exercise weekly  Follow Up Plan: Telephone follow up appointment with care management team member scheduled for: 10/10/2020      Medication Assistance: Delene Loll and Lantus obtained through [employer] and [employer] medication assistance program.  Enrollment ends 06/29/2021  Patient's preferred pharmacy is:  [id number] [place] [address] [address]Phone: [phone] Fax: [phone] Uses pill box? Yes Pt endorses excellent compliance  We discussed: Benefits of medication synchronization, packaging and delivery as well as enhanced pharmacist oversight with [employer]. Patient decided to: Continue current medication management strategy  Care Plan and Follow Up Patient Decision:  Patient agrees to Care Plan and Follow-up.  Plan: Telephone follow up appointment with care management team member scheduled for:  10/09/2020 @ 3:30 pm

## 2020-09-13 DIAGNOSIS — M6281 Muscle weakness (generalized): Secondary | ICD-10-CM | POA: Diagnosis not present

## 2020-09-13 DIAGNOSIS — M25611 Stiffness of right shoulder, not elsewhere classified: Secondary | ICD-10-CM | POA: Diagnosis not present

## 2020-09-13 DIAGNOSIS — G20 Parkinson's disease: Secondary | ICD-10-CM | POA: Diagnosis not present

## 2020-09-13 DIAGNOSIS — M25511 Pain in right shoulder: Secondary | ICD-10-CM | POA: Diagnosis not present

## 2020-09-13 DIAGNOSIS — M25621 Stiffness of right elbow, not elsewhere classified: Secondary | ICD-10-CM | POA: Diagnosis not present

## 2020-09-13 DIAGNOSIS — S4291XA Fracture of right shoulder girdle, part unspecified, initial encounter for closed fracture: Secondary | ICD-10-CM | POA: Diagnosis not present

## 2020-09-13 NOTE — Patient Instructions (Addendum)
Visit Information  Goals Addressed            This Visit's Progress   . Improve My Heart Health-Coronary Artery Disease       Timeframe:  Long-Range Goal Priority:  High Start Date:           09/11/2020                  Expected End Date: 09/11/2021                       Follow Up Date 10/10/2020    - if I have chest pain, call for help    Why is this important?    Lifestyle changes are key to improving the blood flow to your heart. Think about the things you can change and set a goal to live healthy.   Remember, when the blood vessels to your heart start to get clogged you may not have any symptoms.   Over time, they can get worse.   Don't ignore the signs, like chest pain, and get help right away.     Notes:     [name] Monitor and Manage My Blood Sugar-Diabetes Type 2       Timeframe:  Long-Range Goal Priority:  High Start Date:     09/11/2020                        Expected End Date:       09/11/2021                Follow Up Date 10/10/2020    - check blood sugar at prescribed times - check blood sugar if I feel it is too high or too low - take the blood sugar meter to all doctor visits    Why is this important?    Checking your blood sugar at home helps to keep it from getting very high or very low.   Writing the results in a diary or log helps the doctor know how to care for you.   Your blood sugar log should have the time, date and the results.   Also, write down the amount of insulin or other medicine that you take.   Other information, like what you ate, exercise done and how you were feeling, will also be helpful.     Notes:     . Set My Target A1C-Diabetes Type 2       Timeframe:  Long-Range Goal Priority:  High Start Date:            09/11/2020                 Expected End Date:      09/11/2021                 Follow Up Date 10/10/2020    - set target A1C    Why is this important?    Your target A1C is decided together by you and your doctor.    It is based on several things like your age and other health issues.    Notes:     . Track and Manage Activity and Exertion-Heart Failure       Timeframe:  Long-Range Goal Priority:  High Start Date:                09/11/2020  Expected End Date: 09/11/2021                      Follow Up Date 10/10/2020     - follow activity or exercise plan - meet with physical therapist    Why is this important?    Exercising is very important when managing your heart failure.   It will help your heart get stronger.    Notes:       Patient Care Plan: CCM Pharmacy Care Plan    Problem Identified: CAD, Diabetes, Hypertension   Priority: High  Onset Date: 08/07/2020    Long-Range Goal: Disease Management   Start Date: 08/07/2020  Expected End Date: 08/07/2021  Recent Progress: On track  Priority: High  Note:   Current Barriers:  . Unable to achieve control of Diabetes   Pharmacist Clinical Goal(s):  [name] Patient will achieve control of diabetes as evidenced by a1c and blood sugar readings through collaboration with PharmD and provider.   Interventions: . 1:1 collaboration with Abigail Miyamoto, MD regarding development and update of comprehensive plan of care as evidenced by provider attestation and co-signature . Inter-disciplinary care team collaboration (see longitudinal plan of care) . Comprehensive medication review performed; medication list updated in electronic medical record  Hyperlipidemia: (LDL goal < 55) -Not ideally controlled -Current treatment: .  rosuvastatin 10 mg daily  -Medications previously tried: atorvastatin  -Current dietary patterns: eating healthy and trying to adhere to diabetic diet -Current exercise habits: [employer] 5 days a week normally and PT 2 days weekly for shoulder. Wife broke her arm and unable to get to gym currently.  -Educated on Cholesterol goals;  Benefits of statin for ASCVD risk reduction; Importance of limiting  foods high in cholesterol; Exercise goal of 150 minutes per week; -Counseled on diet and exercise extensively Recommended to continue current medication  Diabetes (A1c goal <7%) -Uncontrolled -Current medications: . Lantus 19 units into the skin daily  . Metformin 1000 mg bid  . Freestyle Libre . Insulin pen needles  -Medications previously tried: 70/30 insulin  -Current home glucose readings . fasting glucose: 151-300 . post prandial glucose: 50-400 -Reports hypoglycemic/hyperglycemic symptoms -Current meal patterns:  . breakfast: eggs . lunch: trying to eat balanced meals . dinner: meat and vegetables . snacks: ensure/boost . drinks: juice to correct low blood sugar -Current exercise:  [employer]-f when wife can drive and pt for shoulder -Educated on A1c and blood sugar goals; Complications of diabetes including kidney damage, retinal damage, and cardiovascular disease; Exercise goal of 150 minutes per week; Prevention and management of hypoglycemic episodes; Continuous glucose monitoring; -Counseled to check feet daily and get yearly eye exams -Counseled on diet and exercise extensively Recommended patient consider [name] for improved blood sugar management, goal of reducing insulin dose and kidney protection. Provided patient and wife with the name of the medication so they can review. Patient and wife reports they will let pharmacist know if they decide to proceed with [place]. Pharmacist will coordinate patient assistance at that time.   Patient Goals/Self-Care Activities . Patient will:  - take medications as prescribed focus on medication adherence by using pill box check glucose throughout the day with [facility], document, and provide at future appointments target a minimum of 150 minutes of moderate intensity exercise weekly  Follow Up Plan: Telephone follow up appointment with care management team member scheduled for: 10/10/2020      The patient  verbalized understanding of instructions, educational  materials, and care plan provided today and declined offer to receive copy of patient instructions, educational materials, and care plan.  Telephone follow up appointment with pharmacy team member scheduled for: 10/10/2020  Jeffrey Parsons, [facility] Dapagliflozin tablets What is this medicine? DAPAGLIFLOZIN (DAP a gli FLOE zin) controls blood sugar in people with diabetes. It is used with lifestyle changes like diet and exercise. It also treats heart failure and kidney disease. It may lower the risk for treatment of heart failure in the hospital or worsened kidney disease. This medicine may be used for other purposes; ask your health care provider or pharmacist if you have questions. COMMON BRAND NAME(S): [name] What should I tell my health care provider before I take this medicine? They need to know if you have any of these conditions:  dehydration  diabetic ketoacidosis  diet low in salt  eating less due to illness, surgery, dieting, or any other reason  having surgery  history of pancreatitis or pancreas problems  history of yeast infection of the penis or vagina  if you often drink alcohol  infection in the bladder, kidneys, or urinary tract  kidney disease  low blood pressure  on dialysis  problems urinating  type 1 diabetes  uncircumcised male  an unusual or allergic reaction to dapagliflozin, other medicines, foods, dyes, or preservatives  pregnant or trying to get pregnant  breast-feeding How should I use this medicine? Take this medicine by mouth with water. Take it as directed on the prescription label at the same time every day. You can take it with or without food. If it upsets your stomach, take it with food. Keep taking it unless your health care provider tells you to stop. A special MedGuide will be given to you by the pharmacist with each prescription and refill. Be sure to read this information carefully  each time. Talk to your health care provider about the use of this medicine in children. Special care may be needed. Overdosage: If you think you have taken too much of this medicine contact a poison control center or emergency room at once. NOTE: This medicine is only for you. Do not share this medicine with others. What if I miss a dose? If you miss a dose, take it as soon as you can. If it is almost time for your next dose, take only that dose. Do not take double or extra doses. What may interact with this medicine? Interactions are not expected. This list may not describe all possible interactions. Give your health care provider a list of all the medicines, herbs, non-prescription drugs, or dietary supplements you use. Also tell them if you smoke, drink alcohol, or use illegal drugs. Some items may interact with your medicine. What should I watch for while using this medicine? Visit your health care provider for regular checks on your progress. Tell your health care provider if your symptoms do not start to get better or if they get worse. This medicine can cause a serious condition in which there is too much acid in the blood. If you develop nausea, vomiting, stomach pain, unusual tiredness, or breathing problems, stop taking this medicine and call your doctor right away. If possible, use a ketone dipstick to check for ketones in your urine. Check with your health care provider if you have severe diarrhea, nausea, and vomiting, or if you sweat a lot. The loss of too much body fluid may make it dangerous for you to take this medicine. A test called  the HbA1C (A1C) will be monitored. This is a simple blood test. It measures your blood sugar control over the last 2 to 3 months. You will receive this test every 3 to 6 months. Learn how to check your blood sugar. Learn the symptoms of low and high blood sugar and how to manage them. Always carry a quick-source of sugar with you in case you have  symptoms of low blood sugar. Examples include hard sugar candy or glucose tablets. Make sure others know that you can choke if you eat or drink when you develop serious symptoms of low blood sugar, such as seizures or unconsciousness. Get medical help at once. Tell your health care provider if you have high blood sugar. You might need to change the dose of your medicine. If you are sick or exercising more than usual, you may need to change the dose of your medicine. Do not skip meals. Ask your health care provider if you should avoid alcohol. Many nonprescription cough and cold products contain sugar or alcohol. These can affect blood sugar. Wear a medical ID bracelet or chain. Carry a card that describes your condition. List the medicines and doses you take on the card. What side effects may I notice from receiving this medicine? Side effects that you should report to your doctor or health care professional as soon as possible:  allergic reactions (skin rash, itching or hives, swelling of the face, lips, or tongue)  breathing problems  dizziness  feeling faint or lightheaded, falls  genital infection (fever; tenderness, redness, or swelling in the genitals or area from the genitals to the back of the rectum)  kidney injury (trouble passing urine or change in the amount of urine)  low blood sugar (feeling anxious; confusion; dizziness; increased hunger; unusually weak or tired; increased sweating; shakiness; cold, clammy skin; irritable; headache; blurred vision; fast heartbeat; loss of consciousness)  muscle weakness  nausea, vomiting, unusual stomach upset or pain  new pain or tenderness, change in skin color, sores or ulcers, or infection in legs or feet  penile discharge, itching, or pain  unusual tiredness  unusual vaginal discharge, itching, or odor  urinary tract infection (fever; chills; a burning feeling when urinating; urgent need to urinate more often; blood in the urine;  back pain) Side effects that usually do not require medical attention (report to your doctor or health care professional if they continue or are bothersome):  mild increase in urination  thirsty This list may not describe all possible side effects. Call your doctor for medical advice about side effects. You may report side effects to FDA at 1-800-FDA-1088. Where should I keep my medicine? Keep out of the reach of children and pets. Store at room temperature between 20 and 25 degrees C (68 and 77 degrees F). Get rid of any unused medicine after the expiration date. To get rid of medicines that are no longer needed or have expired:  Take the medicine to a medicine take-back program. Check with your pharmacy or law enforcement to find a location.  If you cannot return the medicine, check the label or package insert to see if the medicine should be thrown out in the garbage or flushed down the toilet. If you are not sure, ask your health care provider. If it is safe to put it in the trash, take the medicine out of the container. Mix the medicine with cat litter, dirt, coffee grounds, or other unwanted substance. Seal the mixture in a bag or  container. Put it in the trash. NOTE: This sheet is a summary. It may not cover all possible information. If you have questions about this medicine, talk to your doctor, pharmacist, or health care provider.  2021 [employer]/Gold Standard (2019-11-23 13:18:47)

## 2020-09-19 DIAGNOSIS — G20 Parkinson's disease: Secondary | ICD-10-CM | POA: Diagnosis not present

## 2020-09-19 DIAGNOSIS — M25621 Stiffness of right elbow, not elsewhere classified: Secondary | ICD-10-CM | POA: Diagnosis not present

## 2020-09-19 DIAGNOSIS — M6281 Muscle weakness (generalized): Secondary | ICD-10-CM | POA: Diagnosis not present

## 2020-09-19 DIAGNOSIS — M25611 Stiffness of right shoulder, not elsewhere classified: Secondary | ICD-10-CM | POA: Diagnosis not present

## 2020-09-19 DIAGNOSIS — S4291XA Fracture of right shoulder girdle, part unspecified, initial encounter for closed fracture: Secondary | ICD-10-CM | POA: Diagnosis not present

## 2020-09-19 DIAGNOSIS — M25511 Pain in right shoulder: Secondary | ICD-10-CM | POA: Diagnosis not present

## 2020-09-21 DIAGNOSIS — S4291XA Fracture of right shoulder girdle, part unspecified, initial encounter for closed fracture: Secondary | ICD-10-CM | POA: Diagnosis not present

## 2020-09-21 DIAGNOSIS — G20 Parkinson's disease: Secondary | ICD-10-CM | POA: Diagnosis not present

## 2020-09-21 DIAGNOSIS — M25621 Stiffness of right elbow, not elsewhere classified: Secondary | ICD-10-CM | POA: Diagnosis not present

## 2020-09-21 DIAGNOSIS — M25611 Stiffness of right shoulder, not elsewhere classified: Secondary | ICD-10-CM | POA: Diagnosis not present

## 2020-09-21 DIAGNOSIS — M6281 Muscle weakness (generalized): Secondary | ICD-10-CM | POA: Diagnosis not present

## 2020-09-21 DIAGNOSIS — M25511 Pain in right shoulder: Secondary | ICD-10-CM | POA: Diagnosis not present

## 2020-09-25 DIAGNOSIS — G20 Parkinson's disease: Secondary | ICD-10-CM | POA: Diagnosis not present

## 2020-09-25 DIAGNOSIS — S4291XA Fracture of right shoulder girdle, part unspecified, initial encounter for closed fracture: Secondary | ICD-10-CM | POA: Diagnosis not present

## 2020-09-25 DIAGNOSIS — M25621 Stiffness of right elbow, not elsewhere classified: Secondary | ICD-10-CM | POA: Diagnosis not present

## 2020-09-25 DIAGNOSIS — M25511 Pain in right shoulder: Secondary | ICD-10-CM | POA: Diagnosis not present

## 2020-09-25 DIAGNOSIS — M6281 Muscle weakness (generalized): Secondary | ICD-10-CM | POA: Diagnosis not present

## 2020-09-25 DIAGNOSIS — M25611 Stiffness of right shoulder, not elsewhere classified: Secondary | ICD-10-CM | POA: Diagnosis not present

## 2020-09-27 DIAGNOSIS — S4291XA Fracture of right shoulder girdle, part unspecified, initial encounter for closed fracture: Secondary | ICD-10-CM | POA: Diagnosis not present

## 2020-09-27 DIAGNOSIS — M25611 Stiffness of right shoulder, not elsewhere classified: Secondary | ICD-10-CM | POA: Diagnosis not present

## 2020-09-27 DIAGNOSIS — M25621 Stiffness of right elbow, not elsewhere classified: Secondary | ICD-10-CM | POA: Diagnosis not present

## 2020-09-27 DIAGNOSIS — M6281 Muscle weakness (generalized): Secondary | ICD-10-CM | POA: Diagnosis not present

## 2020-09-27 DIAGNOSIS — M25511 Pain in right shoulder: Secondary | ICD-10-CM | POA: Diagnosis not present

## 2020-09-27 DIAGNOSIS — G20 Parkinson's disease: Secondary | ICD-10-CM | POA: Diagnosis not present

## 2020-10-02 DIAGNOSIS — M25611 Stiffness of right shoulder, not elsewhere classified: Secondary | ICD-10-CM | POA: Diagnosis not present

## 2020-10-02 DIAGNOSIS — M6281 Muscle weakness (generalized): Secondary | ICD-10-CM | POA: Diagnosis not present

## 2020-10-02 DIAGNOSIS — R2689 Other abnormalities of gait and mobility: Secondary | ICD-10-CM | POA: Diagnosis not present

## 2020-10-02 DIAGNOSIS — R2681 Unsteadiness on feet: Secondary | ICD-10-CM | POA: Diagnosis not present

## 2020-10-02 DIAGNOSIS — S4291XA Fracture of right shoulder girdle, part unspecified, initial encounter for closed fracture: Secondary | ICD-10-CM | POA: Diagnosis not present

## 2020-10-02 DIAGNOSIS — M25621 Stiffness of right elbow, not elsewhere classified: Secondary | ICD-10-CM | POA: Diagnosis not present

## 2020-10-02 DIAGNOSIS — G20 Parkinson's disease: Secondary | ICD-10-CM | POA: Diagnosis not present

## 2020-10-05 DIAGNOSIS — M6281 Muscle weakness (generalized): Secondary | ICD-10-CM | POA: Diagnosis not present

## 2020-10-05 DIAGNOSIS — M25611 Stiffness of right shoulder, not elsewhere classified: Secondary | ICD-10-CM | POA: Diagnosis not present

## 2020-10-05 DIAGNOSIS — R2689 Other abnormalities of gait and mobility: Secondary | ICD-10-CM | POA: Diagnosis not present

## 2020-10-05 DIAGNOSIS — G20 Parkinson's disease: Secondary | ICD-10-CM | POA: Diagnosis not present

## 2020-10-05 DIAGNOSIS — M25621 Stiffness of right elbow, not elsewhere classified: Secondary | ICD-10-CM | POA: Diagnosis not present

## 2020-10-05 DIAGNOSIS — S4291XA Fracture of right shoulder girdle, part unspecified, initial encounter for closed fracture: Secondary | ICD-10-CM | POA: Diagnosis not present

## 2020-10-08 DIAGNOSIS — M8000XA Age-related osteoporosis with current pathological fracture, unspecified site, initial encounter for fracture: Secondary | ICD-10-CM | POA: Diagnosis not present

## 2020-10-08 DIAGNOSIS — M8008XA Age-related osteoporosis with current pathological fracture, vertebra(e), initial encounter for fracture: Secondary | ICD-10-CM | POA: Diagnosis not present

## 2020-10-08 DIAGNOSIS — M810 Age-related osteoporosis without current pathological fracture: Secondary | ICD-10-CM | POA: Diagnosis not present

## 2020-10-09 ENCOUNTER — Ambulatory Visit (INDEPENDENT_AMBULATORY_CARE_PROVIDER_SITE_OTHER): Payer: Medicare Other

## 2020-10-09 ENCOUNTER — Other Ambulatory Visit: Payer: Self-pay

## 2020-10-09 DIAGNOSIS — E782 Mixed hyperlipidemia: Secondary | ICD-10-CM

## 2020-10-09 DIAGNOSIS — E1169 Type 2 diabetes mellitus with other specified complication: Secondary | ICD-10-CM | POA: Diagnosis not present

## 2020-10-09 NOTE — Patient Instructions (Addendum)
Visit Information  Goals Addressed            This Visit's Progress   . Improve My Heart Health-Coronary Artery Disease   On track    Timeframe:  Long-Range Goal Priority:  High Start Date:           09/11/2020                  Expected End Date: 09/11/2021                       Follow Up Date 10/10/2020    - if I have chest pain, call for help    Why is this important?    Lifestyle changes are key to improving the blood flow to your heart. Think about the things you can change and set a goal to live healthy.   Remember, when the blood vessels to your heart start to get clogged you may not have any symptoms.   Over time, they can get worse.   Don't ignore the signs, like chest pain, and get help right away.     Notes:     [name] Monitor and Manage My Blood Sugar-Diabetes Type 2   On track    Timeframe:  Long-Range Goal Priority:  High Start Date:     09/11/2020                        Expected End Date:       09/11/2021                Follow Up Date 10/10/2020    - check blood sugar at prescribed times - check blood sugar if I feel it is too high or too low - take the blood sugar meter to all doctor visits    Why is this important?    Checking your blood sugar at home helps to keep it from getting very high or very low.   Writing the results in a diary or log helps the doctor know how to care for you.   Your blood sugar log should have the time, date and the results.   Also, write down the amount of insulin or other medicine that you take.   Other information, like what you ate, exercise done and how you were feeling, will also be helpful.     Notes:     . Set My Target A1C-Diabetes Type 2   On track    Timeframe:  Long-Range Goal Priority:  High Start Date:            09/11/2020                 Expected End Date:      09/11/2021                 Follow Up Date 10/10/2020    - set target A1C    Why is this important?    Your target A1C is decided together  by you and your doctor.   It is based on several things like your age and other health issues.    Notes:     . Track and Manage Activity and Exertion-Heart Failure   On track    Timeframe:  Long-Range Goal Priority:  High Start Date:                09/11/2020  Expected End Date: 09/11/2021                      Follow Up Date 10/10/2020     - follow activity or exercise plan - meet with physical therapist    Why is this important?    Exercising is very important when managing your heart failure.   It will help your heart get stronger.    Notes:       Patient Care Plan: [facility] Pharmacy Care Plan    Problem Identified: CAD, Diabetes, Hypertension   Priority: High  Onset Date: 08/07/2020    Long-Range Goal: Disease Management   Start Date: 08/07/2020  Expected End Date: 08/07/2021  Recent Progress: On track  Priority: High  Note:   Current Barriers:  . Unable to achieve control of Diabetes   Pharmacist Clinical Goal(s):  [name] Patient will achieve control of diabetes as evidenced by a1c and blood sugar readings through collaboration with PharmD and provider.   Interventions: . 1:1 collaboration with Abigail Miyamoto, MD regarding development and update of comprehensive plan of care as evidenced by provider attestation and co-signature . Inter-disciplinary care team collaboration (see longitudinal plan of care) . Comprehensive medication review performed; medication list updated in electronic medical record  Hyperlipidemia: (LDL goal < 55) -Not ideally controlled -Current treatment: .  rosuvastatin 10 mg daily  -Medications previously tried: atorvastatin  -Current dietary patterns: eating healthy and trying to adhere to diabetic diet -Current exercise habits: [employer] 5 days a week normally and PT 2 days weekly for shoulder. Wife broke her arm and unable to get to gym currently.  -Educated on Cholesterol goals;  Benefits of statin for ASCVD risk  reduction; Importance of limiting foods high in cholesterol; Exercise goal of 150 minutes per week; -Counseled on diet and exercise extensively Recommended to continue current medication  Diabetes (A1c goal <7%) -Uncontrolled -Current medications: . Lantus 17-18 units into the skin daily  . Metformin 1000 mg bid  . Freestyle Libre . Insulin pen needles  -Medications previously tried: 70/30 insulin  -Current home glucose readings . fasting glucose: 75-173 . post prandial glucose: 107-297 -Reports hypoglycemic/hyperglycemic symptoms -Current meal patterns:  . breakfast: eggs . lunch: trying to eat balanced meals . dinner: meat and vegetables . snacks: ensure/boost, peanut butter crackers, pretzels . drinks: juice to correct low blood sugar -Current exercise:  [employer]-f when wife can drive and pt for shoulder/parkinson's -Educated on A1c and blood sugar goals; Complications of diabetes including kidney damage, retinal damage, and cardiovascular disease; Exercise goal of 150 minutes per week; Prevention and management of hypoglycemic episodes; Continuous glucose monitoring; -Counseled to check feet daily and get yearly eye exams -Counseled on diet and exercise extensively Recommended patient continue to watch blood sugar closely. Patient using 17-18 units o finsulin daily. Denies significant low blood sugar overnight. Will continue to monitor closely and consider adding Farxiga in the future to reduce insulin dose further. Patient is not interested at this time.   Patient Goals/Self-Care Activities . Patient will:  - take medications as prescribed focus on medication adherence by using pill box check glucose throughout the day with [facility], document, and provide at future appointments target a minimum of 150 minutes of moderate intensity exercise weekly  Follow Up Plan: Telephone follow up appointment with care management team member scheduled for: 10/10/2020       Patient verbalizes understanding of instructions provided today and agrees to view in MyChart.  Telephone follow up appointment with pharmacy team member scheduled for: 11/13/2020 @ 3:30 pm   Earvin Hansen, [facility]  Preventing Diabetes Mellitus Complications You can help to prevent or slow down problems that are caused by diabetes (diabetes mellitus). Following your diabetes plan and taking care of yourself can reduce your risk of serious or life-threatening complications. What actions can I take to prevent diabetes complications? Diabetes management  Follow instructions from your health care providers about managing your diabetes. Your diabetes may be managed by a team of health care providers who can teach you how to care for yourself and can answer questions that you have.  Educate yourself about your condition so you can make healthy choices about eating and physical activity.  Know your target range for your blood sugar (glucose), and check your blood glucose level as often as told. Your health care provider will help you decide how often to check your blood glucose level depending on your treatment goals and how well you are meeting them.  Ask your health care provider if you should take low-dose aspirin daily and what dose is recommended for you. Taking low-dose aspirin daily is recommended to help prevent cardiovascular disease.   Controlling your blood pressure and cholesterol Your personal target blood pressure is determined based on:  Your age.  Your medicines.  How long you have had diabetes.  Any other medical conditions you have. To control your blood pressure:  Follow instructions from your health care provider about meal planning, exercise, and medicines.  Make sure your health care provider checks your blood pressure at every medical visit.  Monitor your blood pressure at home as told by your health care provider. To control your cholesterol:  Follow  instructions from your health care provider about meal planning, exercise, and medicines.  Have your cholesterol checked at least once a year.  You may be prescribed medicine to lower cholesterol (statin). If you are not taking a statin, ask your health care provider if you should be. Controlling your cholesterol may:  Help prevent heart disease and stroke. These are the most common health problems for people with diabetes.  Improve your blood flow.   Medical appointments and vaccines Schedule and keep yearly physical exams and eye exams. Your health care provider will tell you how often you need medical visits depending on your diabetes management plan. Keep all follow-up visits as told. This is important so possible problems can be identified early and complications can be avoided or treated.  Every visit with your health care provider should include measuring your: ? Weight. ? Blood pressure. ? Blood glucose control.  Your A1C (hemoglobin A1C) level should be checked: ? At least 2 times a year, if you are meeting your treatment goals. ? 4 times a year, if you are not meeting treatment goals or if your treatment goals have changed.  Your blood lipids (lipid profile) should be checked yearly. You should also be checked yearly for protein in your urine (urine microalbumin).  If you have type 1 diabetes, get an eye exam 3-5 years after you are diagnosed, and then once a year after your first exam.  If you have type 2 diabetes, get an eye exam as soon as you are diagnosed, and then once a year after your first exam. It is also important to keep your vaccines current. It is recommended that you receive:  A flu (influenza) vaccine every year.  A pneumonia (pneumococcal) vaccine and a hepatitis B vaccine.  If you are age 23 or older, you may get the pneumonia vaccine as a series of two separate shots. Ask your health care provider which other vaccines may be recommended. Lifestyle  Do  not use any products that contain nicotine or tobacco, such as cigarettes, e-cigarettes, and chewing tobacco. If you need help quitting, ask your health care provider. By avoiding nicotine and tobacco: ? You will lower your risk for heart attack, stroke, nerve disease, and kidney disease. ? Your cholesterol and blood pressure may improve. ? Your blood circulation will improve.  If you drink alcohol: ? Limit how much you use to:  0-1 drink a day for women who are not pregnant.  0-2 drinks a day for men. ? Be aware of how much alcohol is in your drink. In the [place] one drink equals one 12 oz bottle of beer (355 mL), one 5 oz glass of wine (148 mL), or one 11?2 oz glass of hard liquor (44 mL). Taking care of your feet Diabetes may cause you to have poor blood circulation to your legs and feet. Because of this, taking care of your feet is very important. Diabetes can cause:  The skin on the feet to get thinner, break more easily, and heal more slowly.  Nerve damage in your legs and feet, which results in decreased feeling. You may not notice minor injuries that could lead to serious problems. To avoid foot problems:  Check your skin and feet every day for cuts, bruises, redness, blisters, or sores.  Schedule a foot exam with your health care provider once every year. This exam includes: ? Inspecting the structure and skin of your feet. ? Checking the pulses and sensation in your feet.  Make sure that your health care provider performs a visual foot exam at every medical visit.   Taking care of your teeth People with poorly controlled diabetes are more likely to have gum (periodontal) disease. Diabetes can make periodontal diseases harder to control. If not treated, periodontal diseases can lead to tooth loss. To prevent this:  Brush your teeth twice a day.  Floss at least once a day.  Visit your dentist 2 times a year. Managing stress Living with diabetes can be stressful. When you  are experiencing stress, your blood glucose may be affected in two ways:  Stress hormones may cause your blood glucose to rise.  You may be distracted from taking good care of yourself. Be aware of your stress level and make changes to help you manage challenging situations. To lower your stress levels:  Consider joining a support group.  Do planned relaxation or meditation.  Do a hobby that you enjoy.  Maintain healthy relationships.  Exercise regularly.  Work with your health care provider or a mental health professional. Where to find more information  American Diabetes Association: www.diabetes.org  Association of Diabetes Care and Education Specialists: www.diabeteseducator.org Summary  You can take action to prevent or slow down problems that are caused by diabetes (diabetes mellitus). Following your diabetes plan and taking care of yourself can reduce your risk of serious or life-threatening complications.  Follow instructions from your health care providers about managing your diabetes. Your diabetes may be managed by a team of health care providers who can teach you how to care for yourself and can answer questions that you have.  Know your target range for your blood sugar (glucose), and check your blood glucose levels as often as told. Your health care provider will help you  decide how often you should check your blood glucose level depending on your treatment goals and how well you are meeting them.  Your health care provider will tell you how often you need medical visits depending on your diabetes management plan. Keep all follow-up visits as directed. This is important so possible problems can be identified early and complications can be avoided or treated. This information is not intended to replace advice given to you by your health care provider. Make sure you discuss any questions you have with your health care provider. Document Revised: 08/05/2019 Document  Reviewed: 08/05/2019 [employer] Patient Education  2021 [employer]

## 2020-10-09 NOTE — Progress Notes (Signed)
Chronic Care Management Pharmacy Note  10/09/2020 Name:  Jeffrey Parsons MRN:  [id number] DOB:  04/19/1951   Plan Recommendations:   Reviewed patient recent blood sugars. Patient is pleased with current regimen and would like to avoid Farxiga at this time if possible. Patient denies significant low blood sugar overnight and using 17-18 units of insulin each night. Checking blood sugars often using Freestyle Libre 2.   Patient's has a few boxes of insulin that have frozen in the fridge. Pharmacist advised patient to dispose of the insulin that was frozen. Pharmacist will coordinate refill for [employer] PAP. Patient has 1 box that was unaffected. Patient will contact pharmacist when on last unaffected pen to ensure he does not run out.   Encouraged patient to monitor blood pressure to avoid fall risk with hypotension.   Subjective: Jeffrey Parsons is an 70 y.o. year old male who is a primary patient of [name], Zeb Comfort, MD.  The CCM team was consulted for assistance with disease management and care coordination needs.    Engaged with patient by telephone for follow up visit in response to provider referral for pharmacy case management and/or care coordination services.   Consent to Services:  The patient was given information about Chronic Care Management services, agreed to services, and gave verbal consent prior to initiation of services.  Please see initial visit note for detailed documentation.   Patient Care Team: [name], MD as PCP - General (Family Medicine) Nahser, Wonda Cheng, MD as PCP - Cardiology (Cardiology) Deboraha Sprang, MD as PCP - Electrophysiology (Cardiology) Burnice Logan, [facility] (Pharmacist)  Recent office visits: [id number] - no changes. Continue Crestor.   Recent consult visits: 09/03/2020 - neuro - refer to PT for PD and balance. Reduce topiramate to 1 tab in the morning. After 2 weeks if no worsening of your tremor, please stop taking altogether.  Continue to exercise. Consider lowering bp medications as much as possible. People with PD have low bp and are sensitive to low bp.    Hospital visits:   Objective:  Lab Results  Component Value Date   CREATININE 0.86 08/30/2020   BUN 22 08/30/2020   GFRNONAA 76 05/03/2020   GFRAA 87 05/03/2020   NA 136 08/30/2020   K 5.0 08/30/2020   CALCIUM 9.1 08/30/2020   CO2 19 (L) 08/30/2020    Lab Results  Component Value Date/Time   HGBA1C 7.8 (H) 08/30/2020 08:47 AM   HGBA1C 8.6 (H) 05/07/2020 10:38 AM   MICROALBUR 80 01/17/2020 08:38 AM    Last diabetic Eye exam: No results found for: HMDIABEYEEXA  Last diabetic Foot exam: No results found for: HMDIABFOOTEX   Lab Results  Component Value Date   CHOL 121 08/30/2020   HDL 46 08/30/2020   LDLCALC 61 08/30/2020   TRIG 67 08/30/2020   CHOLHDL 2.6 08/30/2020    Hepatic Function Latest Ref Rng & Units 08/30/2020 05/03/2020 01/17/2020  Total Protein 6.0 - 8.5 g/dL 6.3 6.5 6.8  Albumin 3.8 - 4.8 g/dL 4.1 4.4 4.1  AST 0 - 40 IU/L 14 14 15   ALT 0 - 44 IU/L 4 2 4   Alk Phosphatase 44 - 121 IU/L 106 121 114  Total Bilirubin 0.0 - 1.2 mg/dL 0.7 1.0 0.7  Bilirubin, Direct 0.00 - 0.40 mg/dL - 0.23 -    Lab Results  Component Value Date/Time   TSH 1.440 05/22/2020 08:49 AM    CBC Latest Ref Rng & Units 08/30/2020 03/29/2020 01/17/2020  WBC  3.4 - 10.8 x10E3/uL 7.1 7.0 8.2  Hemoglobin 13.0 - 17.7 g/dL 13.2 12.8(L) 13.7  Hematocrit 37.5 - 51.0 % 40.4 38.1(L) 40.7  Platelets 150 - 450 x10E3/uL 160 176 165    No results found for: VD25OH  Clinical ASCVD: Yes  The ASCVD Risk score Mikey Bussing DC Jr., et al., 2013) failed to calculate for the following reasons:   The patient has a prior MI or stroke diagnosis    Depression screen [facility] 2/9 01/17/2020  Decreased Interest 0  Down, Depressed, Hopeless 0  PHQ - 2 Score 0  Altered sleeping 0  Tired, decreased energy 1  Change in appetite 0  Feeling bad or failure about yourself  0  Trouble  concentrating 0  Moving slowly or fidgety/restless 1  Suicidal thoughts 0  PHQ-9 Score 2  Difficult doing work/chores Not difficult at all     Other: (CHADS2VASc if Afib, MMRC or CAT for COPD, ACT, DEXA)  Social History   Tobacco Use  Smoking Status Never Smoker  Smokeless Tobacco Never Used   BP Readings from Last 3 Encounters:  08/30/20 (!) 120/58  07/30/20 118/62  05/22/20 (!) 124/56   Pulse Readings from Last 3 Encounters:  08/30/20 94  07/30/20 92  05/22/20 (!) 104   Wt Readings from Last 3 Encounters:  08/30/20 151 lb (68.5 kg)  07/30/20 155 lb (70.3 kg)  05/22/20 152 lb 3.2 oz (69 kg)    Assessment/Interventions: Review of patient past medical history, allergies, medications, health status, including review of consultants reports, laboratory and other test data, was performed as part of comprehensive evaluation and provision of chronic care management services.   SDOH:  (Social Determinants of Health) assessments and interventions performed: Yes   CCM Care Plan  Allergies  Allergen Reactions  . Onion     Upset stomach  . Sulfa Antibiotics Other (See Comments)    FLUSH     Medications Reviewed Today    Reviewed by Burnice Logan, [facility] (Pharmacist) on 10/09/20 at [place] List Status: <None>  Medication Order Taking? Sig Documenting Provider Last Dose Status Informant  acetaminophen (TYLENOL) 500 MG tablet [id number] Yes Take 500 mg by mouth daily as needed for moderate pain or headache.  [provider] Taking Active Multiple Informants  albuterol (VENTOLIN HFA) 108 (90 Base) MCG/ACT inhaler [id number] Yes Inhale 2 puffs into the lungs every 6 (six) hours as needed for wheezing or shortness of breath. [provider] Taking Active Multiple Informants  aspirin EC 81 MG tablet [id number] Yes Take 1 tablet (81 mg total) by mouth daily. Richardson Dopp T, PA-C Taking Active Multiple Informants  carbidopa-levodopa (SINEMET CR) 50-200 MG tablet  [id number] Yes Take 1 tablet by mouth every evening.  [provider] Taking Active Multiple Informants  carbidopa-levodopa (SINEMET IR) 25-250 MG tablet [id number] Yes Take 1 tablet by mouth 4 (four) times daily.  [provider] Taking Active Multiple Informants  carvedilol (COREG) 12.5 MG tablet [id number] Yes Take 1 tablet (12.5 mg total) by mouth 2 (two) times daily. Nahser, Wonda Cheng, MD Taking Active   Continuous Blood Gluc Sensor (DEXCOM G6 SENSOR) MISC [id number] Yes 1 each by Does not apply route once a week. [name], MD Taking Active Multiple Informants  Continuous Blood Gluc Sensor (FREESTYLE LIBRE 2 SENSOR) [place] [id number] Yes by Does not apply route. [provider] Taking Active Multiple Informants  insulin glargine (LANTUS) 100 UNIT/ML Solostar Pen [id number] Yes Inject 18 Units into the skin  daily. [provider] Taking Active   Insulin Pen Needle (PEN NEEDLES 5/16") 30G X 8 MM MISC [id number] Yes by Does not apply route. [provider] Taking Active Multiple Informants  metFORMIN (GLUCOPHAGE) 500 MG tablet [id number] Yes Take 2 tablets (1,000 mg total) by mouth 2 (two) times daily with a meal. Start this with new insulin [name], MD Taking Active   Multiple Vitamin (MULTIVITAMIN) tablet [id number] Yes Take 1 tablet by mouth daily. [provider] Taking Active Multiple Informants  rosuvastatin (CRESTOR) 10 MG tablet [id number] Yes Take 10 mg by mouth daily. [provider] Taking Active   sacubitril-valsartan (ENTRESTO) 49-51 MG [id number] Yes Take 1 tablet by mouth 2 (two) times daily. Nahser, Wonda Cheng, MD Taking Active Multiple Informants  topiramate (TOPAMAX) 50 MG tablet [id number] Yes Take 50 mg by mouth daily. [provider] Taking Active Multiple Informants          Patient Active Problem List   Diagnosis Date Noted  . Fatigue 05/22/2020  . BPH with obstruction/lower urinary tract  symptoms 05/22/2020  . BMI 23.0-23.9, adult 01/17/2020  . Sick sinus syndrome ([place]) 09/13/2019  . Obesity, diabetes, and hypertension syndrome ([place]) 09/13/2019  . Secondary dystonia 07/04/2019  . Essential hypertension 06/26/2018  . Complete heart block ([place]) 05/26/2017  . Cardiac device in situ 05/26/2017  . Chronic combined systolic and diastolic CHF (congestive heart failure) ([place]) 05/26/2017  . Ischemic cardiomyopathy 05/26/2017  . Allergy to sulfa drugs 12/04/2016  . S/P CABG x 5 09/12/2016  . Mixed hyperlipidemia 09/12/2016  . Coronary artery disease involving native coronary artery of native heart without angina pectoris 09/12/2016  . Pacemaker 06/06/2016  . Long term (current) use of aspirin 12/06/2015  . Mild obesity 12/06/2015  . Other long term (current) drug therapy 12/06/2015  . Mild intermittent asthma with acute exacerbation 12/07/2014  . Parkinson's disease ([place]) 12/07/2014    Immunization History  Administered Date(s) Administered  . Moderna Sars-Covid-2 Vaccination 08/03/2019, 08/31/2019    Conditions to be addressed/monitored:  Hyperlipidemia and Diabetes  Care Plan : [place]  Updates made by Burnice Logan, [place] since 10/09/2020 12:00 AM    Problem: CAD, Diabetes, Hypertension   Priority: High  Onset Date: 08/07/2020    Long-Range Goal: Disease Management   Start Date: 08/07/2020  Expected End Date: 08/07/2021  Recent Progress: On track  Priority: High  Note:   Current Barriers:  . Unable to achieve control of Diabetes   Pharmacist Clinical Goal(s):  [name] Patient will achieve control of diabetes as evidenced by a1c and blood sugar readings through collaboration with PharmD and provider.   Interventions: . 1:1 collaboration with [name], MD regarding development and update of comprehensive plan of care as evidenced by provider attestation and co-signature . Inter-disciplinary care team collaboration (see longitudinal plan of  care) . Comprehensive medication review performed; medication list updated in electronic medical record  Hyperlipidemia: (LDL goal < 55) -Not ideally controlled -Current treatment: .  rosuvastatin 10 mg daily  -Medications previously tried: atorvastatin  -Current dietary patterns: eating healthy and trying to adhere to diabetic diet -Current exercise habits: [employer] 5 days a week normally and PT 2 days weekly for shoulder. Wife broke her arm and unable to get to gym currently.  -Educated on Cholesterol goals;  Benefits of statin for ASCVD risk reduction; Importance of limiting foods high in cholesterol; Exercise goal of 150 minutes per week; -Counseled on diet and exercise extensively  Recommended to continue current medication  Diabetes (A1c goal <7%) -Uncontrolled -Current medications: . Lantus 17-18 units into the skin daily  . Metformin 1000 mg bid  . Freestyle Libre . Insulin pen needles  -Medications previously tried: 70/30 insulin  -Current home glucose readings . fasting glucose: 75-173 . post prandial glucose: 107-297 -Reports hypoglycemic/hyperglycemic symptoms -Current meal patterns:  . breakfast: eggs . lunch: trying to eat balanced meals . dinner: meat and vegetables . snacks: ensure/boost, peanut butter crackers, pretzels . drinks: juice to correct low blood sugar -Current exercise:  [employer] m-f when wife can drive and pt for shoulder/parkinson's -Educated on A1c and blood sugar goals; Complications of diabetes including kidney damage, retinal damage, and cardiovascular disease; Exercise goal of 150 minutes per week; Prevention and management of hypoglycemic episodes; Continuous glucose monitoring; -Counseled to check feet daily and get yearly eye exams -Counseled on diet and exercise extensively Recommended patient continue to watch blood sugar closely. Patient using 17-18 units o finsulin daily. Denies significant low blood sugar overnight. Will  continue to monitor closely and consider adding Farxiga in the future to reduce insulin dose further. Patient is not interested at this time.   Patient Goals/Self-Care Activities . Patient will:  - take medications as prescribed focus on medication adherence by using pill box check glucose throughout the day with [facility], document, and provide at future appointments target a minimum of 150 minutes of moderate intensity exercise weekly  Follow Up Plan: Telephone follow up appointment with care management team member scheduled for: 10/10/2020      Medication Assistance: Delene Loll and Lantus obtained through [employer] and [employer] medication assistance program.  Enrollment ends 06/29/2021  Patient's preferred pharmacy is:  [id number] [place], [address] [address]Phone: [phone] Fax: [phone] Uses pill box? Yes Pt endorses excellent compliance  We discussed: Benefits of medication synchronization, packaging and delivery as well as enhanced pharmacist oversight with [employer]. Patient decided to: Continue current medication management strategy  Care Plan and Follow Up Patient Decision:  Patient agrees to Care Plan and Follow-up.  Plan: Telephone follow up appointment with care management team member scheduled for:  11/13/2020 @ 3:30 pm

## 2020-10-10 ENCOUNTER — Ambulatory Visit (INDEPENDENT_AMBULATORY_CARE_PROVIDER_SITE_OTHER): Payer: Medicare Other

## 2020-10-10 DIAGNOSIS — M25621 Stiffness of right elbow, not elsewhere classified: Secondary | ICD-10-CM | POA: Diagnosis not present

## 2020-10-10 DIAGNOSIS — I442 Atrioventricular block, complete: Secondary | ICD-10-CM

## 2020-10-10 DIAGNOSIS — G20 Parkinson's disease: Secondary | ICD-10-CM | POA: Diagnosis not present

## 2020-10-10 DIAGNOSIS — M6281 Muscle weakness (generalized): Secondary | ICD-10-CM | POA: Diagnosis not present

## 2020-10-10 DIAGNOSIS — S4291XA Fracture of right shoulder girdle, part unspecified, initial encounter for closed fracture: Secondary | ICD-10-CM | POA: Diagnosis not present

## 2020-10-10 DIAGNOSIS — R2689 Other abnormalities of gait and mobility: Secondary | ICD-10-CM | POA: Diagnosis not present

## 2020-10-10 DIAGNOSIS — M25611 Stiffness of right shoulder, not elsewhere classified: Secondary | ICD-10-CM | POA: Diagnosis not present

## 2020-10-12 LAB — CUP PACEART REMOTE DEVICE CHECK
Battery Remaining Longevity: 67 mo
Battery Voltage: 2.98 V
Brady Statistic AP VP Percent: 0.31 %
Brady Statistic AP VS Percent: 0.01 %
Brady Statistic AS VP Percent: 95.43 %
Brady Statistic AS VS Percent: 4.26 %
Brady Statistic RA Percent Paced: 0.31 %
Brady Statistic RV Percent Paced: 95.22 %
Date Time Interrogation Session: 20220413033622
HighPow Impedance: 53 Ohm
Implantable Lead Implant Date: 20111213
Implantable Lead Implant Date: 20191230
Implantable Lead Implant Date: 20191230
Implantable Lead Location: 753858
Implantable Lead Location: 753859
Implantable Lead Location: 753860
Implantable Lead Model: 5076
Implantable Pulse Generator Implant Date: 20191230
Lead Channel Impedance Value: 194.634
Lead Channel Impedance Value: 203.256
Lead Channel Impedance Value: 218.298
Lead Channel Impedance Value: 224.438
Lead Channel Impedance Value: 235.98 Ohm
Lead Channel Impedance Value: 323 Ohm
Lead Channel Impedance Value: 380 Ohm
Lead Channel Impedance Value: 380 Ohm
Lead Channel Impedance Value: 380 Ohm
Lead Channel Impedance Value: 399 Ohm
Lead Channel Impedance Value: 437 Ohm
Lead Channel Impedance Value: 513 Ohm
Lead Channel Impedance Value: 703 Ohm
Lead Channel Impedance Value: 722 Ohm
Lead Channel Impedance Value: 722 Ohm
Lead Channel Impedance Value: 836 Ohm
Lead Channel Impedance Value: 874 Ohm
Lead Channel Impedance Value: 874 Ohm
Lead Channel Pacing Threshold Amplitude: 0.625 V
Lead Channel Pacing Threshold Amplitude: 0.75 V
Lead Channel Pacing Threshold Amplitude: 0.875 V
Lead Channel Pacing Threshold Pulse Width: 0.4 ms
Lead Channel Pacing Threshold Pulse Width: 0.4 ms
Lead Channel Pacing Threshold Pulse Width: 0.4 ms
Lead Channel Sensing Intrinsic Amplitude: 14.25 mV
Lead Channel Sensing Intrinsic Amplitude: 14.25 mV
Lead Channel Sensing Intrinsic Amplitude: 2.375 mV
Lead Channel Sensing Intrinsic Amplitude: 2.375 mV
Lead Channel Setting Pacing Amplitude: 1.5 V
Lead Channel Setting Pacing Amplitude: 1.5 V
Lead Channel Setting Pacing Amplitude: 2 V
Lead Channel Setting Pacing Pulse Width: 0.4 ms
Lead Channel Setting Pacing Pulse Width: 0.4 ms
Lead Channel Setting Sensing Sensitivity: 0.45 mV

## 2020-10-15 DIAGNOSIS — M25611 Stiffness of right shoulder, not elsewhere classified: Secondary | ICD-10-CM | POA: Diagnosis not present

## 2020-10-15 DIAGNOSIS — S4291XA Fracture of right shoulder girdle, part unspecified, initial encounter for closed fracture: Secondary | ICD-10-CM | POA: Diagnosis not present

## 2020-10-15 DIAGNOSIS — M6281 Muscle weakness (generalized): Secondary | ICD-10-CM | POA: Diagnosis not present

## 2020-10-15 DIAGNOSIS — M25621 Stiffness of right elbow, not elsewhere classified: Secondary | ICD-10-CM | POA: Diagnosis not present

## 2020-10-15 DIAGNOSIS — R2689 Other abnormalities of gait and mobility: Secondary | ICD-10-CM | POA: Diagnosis not present

## 2020-10-15 DIAGNOSIS — G20 Parkinson's disease: Secondary | ICD-10-CM | POA: Diagnosis not present

## 2020-10-17 DIAGNOSIS — S4291XA Fracture of right shoulder girdle, part unspecified, initial encounter for closed fracture: Secondary | ICD-10-CM | POA: Diagnosis not present

## 2020-10-17 DIAGNOSIS — R2689 Other abnormalities of gait and mobility: Secondary | ICD-10-CM | POA: Diagnosis not present

## 2020-10-17 DIAGNOSIS — G20 Parkinson's disease: Secondary | ICD-10-CM | POA: Diagnosis not present

## 2020-10-17 DIAGNOSIS — M25611 Stiffness of right shoulder, not elsewhere classified: Secondary | ICD-10-CM | POA: Diagnosis not present

## 2020-10-17 DIAGNOSIS — M6281 Muscle weakness (generalized): Secondary | ICD-10-CM | POA: Diagnosis not present

## 2020-10-17 DIAGNOSIS — M25621 Stiffness of right elbow, not elsewhere classified: Secondary | ICD-10-CM | POA: Diagnosis not present

## 2020-10-23 DIAGNOSIS — M25621 Stiffness of right elbow, not elsewhere classified: Secondary | ICD-10-CM | POA: Diagnosis not present

## 2020-10-23 DIAGNOSIS — G20 Parkinson's disease: Secondary | ICD-10-CM | POA: Diagnosis not present

## 2020-10-23 DIAGNOSIS — M25611 Stiffness of right shoulder, not elsewhere classified: Secondary | ICD-10-CM | POA: Diagnosis not present

## 2020-10-23 DIAGNOSIS — M6281 Muscle weakness (generalized): Secondary | ICD-10-CM | POA: Diagnosis not present

## 2020-10-23 DIAGNOSIS — R2689 Other abnormalities of gait and mobility: Secondary | ICD-10-CM | POA: Diagnosis not present

## 2020-10-23 DIAGNOSIS — S4291XA Fracture of right shoulder girdle, part unspecified, initial encounter for closed fracture: Secondary | ICD-10-CM | POA: Diagnosis not present

## 2020-10-24 NOTE — Progress Notes (Signed)
Remote ICD transmission.   

## 2020-10-29 ENCOUNTER — Encounter: Payer: Self-pay | Admitting: Cardiology

## 2020-10-29 ENCOUNTER — Ambulatory Visit (INDEPENDENT_AMBULATORY_CARE_PROVIDER_SITE_OTHER): Payer: Medicare Other | Admitting: Cardiology

## 2020-10-29 ENCOUNTER — Other Ambulatory Visit: Payer: Self-pay

## 2020-10-29 VITALS — BP 116/60 | HR 88 | Ht 66.0 in | Wt 150.2 lb

## 2020-10-29 DIAGNOSIS — E1159 Type 2 diabetes mellitus with other circulatory complications: Secondary | ICD-10-CM | POA: Diagnosis not present

## 2020-10-29 DIAGNOSIS — I255 Ischemic cardiomyopathy: Secondary | ICD-10-CM

## 2020-10-29 DIAGNOSIS — I152 Hypertension secondary to endocrine disorders: Secondary | ICD-10-CM | POA: Diagnosis not present

## 2020-10-29 DIAGNOSIS — E669 Obesity, unspecified: Secondary | ICD-10-CM | POA: Diagnosis not present

## 2020-10-29 DIAGNOSIS — I442 Atrioventricular block, complete: Secondary | ICD-10-CM

## 2020-10-29 DIAGNOSIS — E1169 Type 2 diabetes mellitus with other specified complication: Secondary | ICD-10-CM

## 2020-10-29 DIAGNOSIS — I495 Sick sinus syndrome: Secondary | ICD-10-CM

## 2020-10-29 DIAGNOSIS — I2510 Atherosclerotic heart disease of native coronary artery without angina pectoris: Secondary | ICD-10-CM

## 2020-10-29 DIAGNOSIS — I5042 Chronic combined systolic (congestive) and diastolic (congestive) heart failure: Secondary | ICD-10-CM

## 2020-10-29 DIAGNOSIS — I10 Essential (primary) hypertension: Secondary | ICD-10-CM | POA: Diagnosis not present

## 2020-10-29 NOTE — Progress Notes (Signed)
Cardiology Office Note:    Date:  10/29/2020   ID:  Jeffrey Parsons, DOB 10-Apr-1951, MRN [id number]  PCP:  Abigail Miyamoto, MD  Cardiologist:  Kristeen Miss, MD  Electrophysiologist:  Sherryl Manges, MD   Referring MD: Abigail Miyamoto,*   I am doing a lot better  History of Present Illness:    Jeffrey Parsons is a 70 y.o. male with a hx of coronary artery disease status post CABG at age 64 in 2002, diabetes, sick sinus syndrome status post pacemaker, hyperlipidemia, Parkinson's disease and asthma.  The patient usually follows with Dr. Elease Hashimoto and was last seen by him in January 2022 at that time his Sherryll Burger was increased to 49-51 mg twice daily.  And his Lasix was decreased to 20 mg daily. He is here today for follow-up visit.  He offers no complaints at this time today.  His visit today is to establish care here in [place].  He is doing well.  Past Medical History:  Diagnosis Date  . AICD (automatic cardioverter/defibrillator) present   . Complete heart block (HCC) 05/26/2017  . Coronary artery disease involving native coronary artery of native heart without angina pectoris 09/12/2016  . Ischemic cardiomyopathy 05/26/2017  . Pneumonia 2019  . Retinopathy     Past Surgical History:  Procedure Laterality Date  . BI-VENTRICULAR IMPLANTABLE CARDIOVERTER DEFIBRILLATOR UPGRADE  06/25/2018  . BIV UPGRADE N/A 06/25/2018   Procedure: BIV ICD UPGRADE;  Surgeon: Duke Salvia, MD;  Location: [facility] INVASIVE CV LAB;  Service: Cardiovascular;  Laterality: N/A;  . CARDIAC CATHETERIZATION  2001; ~ 2013  . CATARACT EXTRACTION W/ INTRAOCULAR LENS  IMPLANT, BILATERAL Bilateral   . CORONARY ARTERY BYPASS GRAFT  2001   "CABG X5"  . INSERT / REPLACE / REMOVE PACEMAKER  05/2010  . KNEE SURGERY Left    AS A CHILD  . R shoulder surgery  05/2020  . RIGHT/LEFT HEART CATH AND CORONARY/GRAFT ANGIOGRAPHY N/A 06/16/2017   Procedure: RIGHT/LEFT HEART CATH AND CORONARY/GRAFT ANGIOGRAPHY;   Surgeon: Tonny Bollman, MD;  Location: [facility] INVASIVE CV LAB;  Service: Cardiovascular;  Laterality: N/A;  . TONSILLECTOMY      Current Medications: Current Meds  Medication Sig  . acetaminophen (TYLENOL) 500 MG tablet Take 500 mg by mouth daily as needed for moderate pain or headache.   . albuterol (VENTOLIN HFA) 108 (90 Base) MCG/ACT inhaler Inhale 2 puffs into the lungs every 6 (six) hours as needed for wheezing or shortness of breath.  [name] aspirin EC 81 MG tablet Take 1 tablet (81 mg total) by mouth daily.  . carbidopa-levodopa (SINEMET CR) 50-200 MG tablet Take 1 tablet by mouth every evening.   . carbidopa-levodopa (SINEMET IR) 25-250 MG tablet Take 1 tablet by mouth 4 (four) times daily.   . carvedilol (COREG) 12.5 MG tablet Take 1 tablet (12.5 mg total) by mouth 2 (two) times daily.  . Continuous Blood Gluc Sensor (DEXCOM G6 SENSOR) MISC 1 each by Does not apply route once a week.  . Continuous Blood Gluc Sensor (FREESTYLE LIBRE 2 SENSOR) MISC by Does not apply route.  . insulin glargine (LANTUS) 100 UNIT/ML Solostar Pen Inject 18 Units into the skin daily.  . Insulin Pen Needle (PEN NEEDLES 5/16") 30G X 8 MM MISC by Does not apply route.  . metFORMIN (GLUCOPHAGE) 500 MG tablet Take 2 tablets (1,000 mg total) by mouth 2 (two) times daily with a meal. Start this with new insulin  . Multiple Vitamin (MULTIVITAMIN) tablet Take 1  tablet by mouth daily.  . rosuvastatin (CRESTOR) 10 MG tablet Take 10 mg by mouth daily.  . sacubitril-valsartan (ENTRESTO) 49-51 MG Take 1 tablet by mouth 2 (two) times daily.  . [DISCONTINUED] topiramate (TOPAMAX) 50 MG tablet Take 50 mg by mouth daily.     Allergies:   Onion and Sulfa antibiotics   Social History   Socioeconomic History  . Marital status: Married    Spouse name: Not on file  . Number of children: Not on file  . Years of education: Not on file  . Highest education level: Not on file  Occupational History  . Not on file  Tobacco Use  .  Smoking status: Never Smoker  . Smokeless tobacco: Never Used  Vaping Use  . Vaping Use: Never used  Substance and Sexual Activity  . Alcohol use: Never  . Drug use: Never  . Sexual activity: Yes  Other Topics Concern  . Not on file  Social History Narrative  . Not on file   Social Determinants of Health   Financial Resource Strain: Not on file  Food Insecurity: No Food Insecurity  . Worried About Programme researcher, broadcasting/film/video in the Last Year: Never true  . Ran Out of Food in the Last Year: Never true  Transportation Needs: No Transportation Needs  . Lack of Transportation (Medical): No  . Lack of Transportation (Non-Medical): No  Physical Activity: Insufficiently Active  . Days of Exercise per Week: 3 days  . Minutes of Exercise per Session: 30 min  Stress: Not on file  Social Connections: Socially Integrated  . Frequency of Communication with Friends and Family: More than three times a week  . Frequency of Social Gatherings with Friends and Family: Twice a week  . Attends Religious Services: More than 4 times per year  . Active Member of Clubs or Organizations: Yes  . Attends Banker Meetings: More than 4 times per year  . Marital Status: Married     Family History: The patient's family history includes Bronchitis in his mother; Coronary artery disease in his father; Heart disease in his father; Hypertension in his brother and sister.  ROS:   Review of Systems  Constitution: Negative for decreased appetite, fever and weight gain.  HENT: Negative for congestion, ear discharge, hoarse voice and sore throat.   Eyes: Negative for discharge, redness, vision loss in right eye and visual halos.  Cardiovascular: Negative for chest pain, dyspnea on exertion, leg swelling, orthopnea and palpitations.  Respiratory: Negative for cough, hemoptysis, shortness of breath and snoring.   Endocrine: Negative for heat intolerance and polyphagia.  Hematologic/Lymphatic: Negative for  bleeding problem. Does not bruise/bleed easily.  Skin: Negative for flushing, nail changes, rash and suspicious lesions.  Musculoskeletal: Negative for arthritis, joint pain, muscle cramps, myalgias, neck pain and stiffness.  Gastrointestinal: Negative for abdominal pain, bowel incontinence, diarrhea and excessive appetite.  Genitourinary: Negative for decreased libido, genital sores and incomplete emptying.  Neurological: Negative for brief paralysis, focal weakness, headaches and loss of balance.  Psychiatric/Behavioral: Negative for altered mental status, depression and suicidal ideas.  Allergic/Immunologic: Negative for HIV exposure and persistent infections.    EKGs/Labs/Other Studies Reviewed:    The following studies were reviewed today:   EKG: None   Echocardiogram done at [facility] on June 23, 2020 shows EF is improved moderately to severe 30 to 35%.  There is akinesis of the inferior, inferolateral and apical inferior walls, hypokinesis in the remaining microwatt segment.  Grade  1 diastolic dysfunction.  Right ventricle was normal in size and function.  Left atrium was mildly dilated by volume.  The right atrium is normal in size and function.  Pacemaker was noted.  Aortic valve was trileaflet and moderately thickened.  Trace mitral regurgitation was present.  Mild tricuspid regurgitation was present.  Recent Labs: 05/22/2020: TSH 1.440 08/30/2020: ALT 4; BUN 22; Creatinine, Ser 0.86; Hemoglobin 13.2; Platelets 160; Potassium 5.0; Sodium 136  Recent Lipid Panel    Component Value Date/Time   CHOL 121 08/30/2020 0847   TRIG 67 08/30/2020 0847   HDL 46 08/30/2020 0847   CHOLHDL 2.6 08/30/2020 0847   LDLCALC 61 08/30/2020 0847    Physical Exam:    VS:  BP 116/60   Pulse 88   Ht  (1.676 m)   Wt 150 lb 3.2 oz (68.1 kg)   SpO2 98%   BMI 24.24 kg/m     Wt Readings from Last 3 Encounters:  10/29/20 150 lb 3.2 oz (68.1 kg)  08/30/20 151 lb (68.5 kg)   07/30/20 155 lb (70.3 kg)     GEN: Well nourished, well developed in no acute distress HEENT: Normal NECK: No JVD; No carotid bruits LYMPHATICS: No lymphadenopathy CARDIAC: S1S2 noted,RRR, no murmurs, rubs, gallops RESPIRATORY:  Clear to auscultation without rales, wheezing or rhonchi  ABDOMEN: Soft, non-tender, non-distended, +bowel sounds, no guarding. EXTREMITIES: No edema, No cyanosis, no clubbing MUSCULOSKELETAL:  No deformity  SKIN: Warm and dry NEUROLOGIC:  Alert and oriented x 3, non-focal PSYCHIATRIC:  Normal affect, good insight  ASSESSMENT:    1. Coronary artery disease involving native coronary artery of native heart without angina pectoris   2. Complete heart block (HCC)   3. Chronic combined systolic and diastolic CHF (congestive heart failure) (HCC)   4. Ischemic cardiomyopathy   5. Essential hypertension   6. Sick sinus syndrome (HCC)   7. Obesity, diabetes, and hypertension syndrome (HCC)    PLAN:     He stable from a coronary artery disease standpoint.  No changes will be made to his medication regimen in terms of his coronary artery disease per He does have ischemic cardiomyopathy his EF in December 2021 was 30 to 35% which was an increase from his previous EF.  He is now on Entresto his doses have been increased to 49-51 twice daily and carvedilol 6.25 mg twice a day.  Ideally he should be placed on Aldactone as well as [place].  His blood pressure today systolic is low so we will hold off on adding his Aldactone. He does have diabetes he is on insulin I am going to talk to his PCP as I would really like to add [name] to his regimen.  He follows with Dr. Graciela Husbands for EP.  I had a discussion with the patient his follow-up visit in 6 months.  He prefers not to be seen by general cardiology in [place].  In 6 months I am going to be transitioning to our [place] office therefore the patient will be transferred to another one of the partners in her office here  in [place].  The patient is in agreement with the above plan. The patient left the office in stable condition.  The patient will follow up in 6 months or sooner if needed.   Medication Adjustments/Labs and Tests Ordered: Current medicines are reviewed at length with the patient today.  Concerns regarding medicines are outlined above.  No orders of the defined types were placed in this  encounter.  No orders of the defined types were placed in this encounter.   Patient Instructions  Medication Instructions:  Your physician recommends that you continue on your current medications as directed. Please refer to the Current Medication list given to you today.  *If you need a refill on your cardiac medications before your next appointment, please call your pharmacy*   Lab Work: None If you have labs (blood work) drawn today and your tests are completely normal, you will receive your results only by: [name] MyChart Message (if you have MyChart) OR . A paper copy in the mail If you have any lab test that is abnormal or we need to change your treatment, we will call you to review the results.   Testing/Procedures: None   Follow-Up: At [facility], you and your health needs are our priority.  As part of our continuing mission to provide you with exceptional heart care, we have created designated Provider Care Teams.  These Care Teams include your primary Cardiologist (physician) and Advanced Practice Providers (APPs -  Physician Assistants and Nurse Practitioners) who all work together to provide you with the care you need, when you need it.  We recommend signing up for the patient portal called "MyChart".  Sign up information is provided on this After Visit Summary.  MyChart is used to connect with patients for Virtual Visits (Telemedicine).  Patients are able to view lab/test results, encounter notes, upcoming appointments, etc.  Non-urgent messages can be sent to your provider as well.   To  learn more about what you can do with MyChart, go to [email or web address].    Your next appointment:   6 month(s)  The format for your next appointment:   In Person  Provider:   Norman Herrlich, MD   Other Instructions      Adopting a Healthy Lifestyle.  Know what a healthy weight is for you (roughly BMI <25) and aim to maintain this   Aim for 7+ servings of fruits and vegetables daily   65-80+ fluid ounces of water or unsweet tea for healthy kidneys   Limit to max 1 drink of alcohol per day; avoid smoking/tobacco   Limit animal fats in diet for cholesterol and heart health - choose grass fed whenever available   Avoid highly processed foods, and foods high in saturated/trans fats   Aim for low stress - take time to unwind and care for your mental health   Aim for 150 min of moderate intensity exercise weekly for heart health, and weights twice weekly for bone health   Aim for 7-9 hours of sleep daily   When it comes to diets, agreement about the perfect plan isnt easy to find, even among the experts. Experts at the [facility] [employer] developed an idea known as the Healthy Eating Plate. Just imagine a plate divided into logical, healthy portions.   The emphasis is on diet quality:   Load up on vegetables and fruits - one-half of your plate: Aim for color and variety, and remember that potatoes dont count.   Go for whole grains - one-quarter of your plate: Whole wheat, barley, wheat berries, quinoa, oats, brown rice, and foods made with them. If you want pasta, go with whole wheat pasta.   Protein power - one-quarter of your plate: Fish, chicken, beans, and nuts are all healthy, versatile protein sources. Limit red meat.   The diet, however, does go beyond the plate, offering a few other suggestions.  Use healthy plant oils, such as olive, canola, soy, corn, sunflower and peanut. Check the labels, and avoid partially hydrogenated oil, which have  unhealthy trans fats.   If youre thirsty, drink water. Coffee and tea are good in moderation, but skip sugary drinks and limit milk and dairy products to one or two daily servings.   The type of carbohydrate in the diet is more important than the amount. Some sources of carbohydrates, such as vegetables, fruits, whole grains, and beans-are healthier than others.   Finally, stay active  Signed, Thomasene RippleKardie Fabrizio Filip, DO  10/29/2020 11:11 AM    [facility]

## 2020-10-29 NOTE — Patient Instructions (Signed)

## 2020-11-08 NOTE — Progress Notes (Signed)
Chronic Care Management Pharmacy Note  11/16/2020 Name:  Jeffrey Parsons MRN:  [id number] DOB:  1950-07-07   Plan Recommendations:   Patient reports frequent blood sugar fluctuations and overnight low blood sugar. Pharmacist counseled on the risks associated with low blood sugar and suggested patient reconsider recommendation to begin Farxiga 5 mg daily for 4 weeks then increase to 10 mg daily if well tolerated. Samples provided for Farxiga 5 mg. Consulted with Dr. [name] who approved reducing Lantus dose to 10 units daily once Farxiga 5 mg started and suggest further reduction to 5 units when patient increases to 10 mg daily with ultimate goal to discontinue basal insulin if blood sugar stabilizes. Patient reports understanding. Pharmacist coordinating patient assistance form for [place]. Patient continues to experience weight loss which makes GLP-1 not the ideal choice. Counseled patient on continuing Metformin 1000 mg bid.   Subjective: Jeffrey Parsons is an 70 y.o. year old male who is a primary patient of [name], Zeb Comfort, MD.  The CCM team was consulted for assistance with disease management and care coordination needs.    Engaged with patient by telephone for follow up visit in response to provider referral for pharmacy case management and/or care coordination services.   Consent to Services:  The patient was given information about Chronic Care Management services, agreed to services, and gave verbal consent prior to initiation of services.  Please see initial visit note for detailed documentation.   Patient Care Team: [name], MD as PCP - General (Family Medicine) Nahser, Wonda Cheng, MD as PCP - Cardiology (Cardiology) Deboraha Sprang, MD as PCP - Electrophysiology (Cardiology) Burnice Logan, [facility] (Pharmacist)  Recent office visits: [phone] - no changes. Continue Crestor.   Recent consult visits: 09/03/2020 - neuro - refer to PT for PD and balance. Reduce  topiramate to 1 tab in the morning. After 2 weeks if no worsening of your tremor, please stop taking altogether. Continue to exercise. Consider lowering bp medications as much as possible. People with PD have low bp and are sensitive to low bp.    Hospital visits:   Objective:  Lab Results  Component Value Date   CREATININE 0.86 08/30/2020   BUN 22 08/30/2020   GFRNONAA 76 05/03/2020   GFRAA 87 05/03/2020   NA 136 08/30/2020   K 5.0 08/30/2020   CALCIUM 9.1 08/30/2020   CO2 19 (L) 08/30/2020    Lab Results  Component Value Date/Time   HGBA1C 7.8 (H) 08/30/2020 08:47 AM   HGBA1C 8.6 (H) 05/07/2020 10:38 AM   MICROALBUR 80 01/17/2020 08:38 AM    Last diabetic Eye exam: No results found for: HMDIABEYEEXA  Last diabetic Foot exam: No results found for: HMDIABFOOTEX   Lab Results  Component Value Date   CHOL 121 08/30/2020   HDL 46 08/30/2020   LDLCALC 61 08/30/2020   TRIG 67 08/30/2020   CHOLHDL 2.6 08/30/2020    Hepatic Function Latest Ref Rng & Units 08/30/2020 05/03/2020 01/17/2020  Total Protein 6.0 - 8.5 g/dL 6.3 6.5 6.8  Albumin 3.8 - 4.8 g/dL 4.1 4.4 4.1  AST 0 - 40 IU/L 14 14 15   ALT 0 - 44 IU/L 4 2 4   Alk Phosphatase 44 - 121 IU/L 106 121 114  Total Bilirubin 0.0 - 1.2 mg/dL 0.7 1.0 0.7  Bilirubin, Direct 0.00 - 0.40 mg/dL - 0.23 -    Lab Results  Component Value Date/Time   TSH 1.440 05/22/2020 08:49 AM    CBC Latest  Ref Rng & Units 08/30/2020 03/29/2020 01/17/2020  WBC 3.4 - 10.8 x10E3/uL 7.1 7.0 8.2  Hemoglobin 13.0 - 17.7 g/dL 13.2 12.8(L) 13.7  Hematocrit 37.5 - 51.0 % 40.4 38.1(L) 40.7  Platelets 150 - 450 x10E3/uL 160 176 165    No results found for: VD25OH  Clinical ASCVD: Yes  The ASCVD Risk score Mikey Bussing DC Jr., et al., 2013) failed to calculate for the following reasons:   The patient has a prior MI or stroke diagnosis    Depression screen [facility] 2/9 01/17/2020  Decreased Interest 0  Down, Depressed, Hopeless 0  PHQ - 2 Score 0  Altered sleeping  0  Tired, decreased energy 1  Change in appetite 0  Feeling bad or failure about yourself  0  Trouble concentrating 0  Moving slowly or fidgety/restless 1  Suicidal thoughts 0  PHQ-9 Score 2  Difficult doing work/chores Not difficult at all     Other: (CHADS2VASc if Afib, MMRC or CAT for COPD, ACT, DEXA)  Social History   Tobacco Use  Smoking Status Never Smoker  Smokeless Tobacco Never Used   BP Readings from Last 3 Encounters:  10/29/20 116/60  08/30/20 (!) 120/58  07/30/20 118/62   Pulse Readings from Last 3 Encounters:  10/29/20 88  08/30/20 94  07/30/20 92   Wt Readings from Last 3 Encounters:  10/29/20 150 lb 3.2 oz (68.1 kg)  08/30/20 151 lb (68.5 kg)  07/30/20 155 lb (70.3 kg)    Assessment/Interventions: Review of patient past medical history, allergies, medications, health status, including review of consultants reports, laboratory and other test data, was performed as part of comprehensive evaluation and provision of chronic care management services.   SDOH:  (Social Determinants of Health) assessments and interventions performed: Yes   CCM Care Plan  Allergies  Allergen Reactions  . Onion     Upset stomach  . Sulfa Antibiotics Other (See Comments)    FLUSH     Medications Reviewed Today    Reviewed by Burnice Logan, [facility] (Pharmacist) on 11/16/20 at 1325  Med List Status: <None>  Medication Order Taking? Sig Documenting Provider Last Dose Status Informant  acetaminophen (TYLENOL) 500 MG tablet [id number] Yes Take 500 mg by mouth daily as needed for moderate pain or headache.  [provider] Taking Active Multiple Informants  albuterol (VENTOLIN HFA) 108 (90 Base) MCG/ACT inhaler [id number] Yes Inhale 2 puffs into the lungs every 6 (six) hours as needed for wheezing or shortness of breath. [provider] Taking Active Multiple Informants  aspirin EC 81 MG tablet [id number] Yes Take 1 tablet (81 mg total) by mouth daily. Richardson Dopp  T, PA-C Taking Active Multiple Informants  carbidopa-levodopa (SINEMET CR) 50-200 MG tablet [id number] Yes Take 1 tablet by mouth every evening.  [provider] Taking Active Multiple Informants  carbidopa-levodopa (SINEMET IR) 25-250 MG tablet [id number] Yes Take 1 tablet by mouth 4 (four) times daily.  [provider] Taking Active Multiple Informants  carvedilol (COREG) 12.5 MG tablet [id number] Yes Take 1 tablet (12.5 mg total) by mouth 2 (two) times daily. Nahser, Wonda Cheng, MD Taking Active   Continuous Blood Gluc Sensor (DEXCOM G6 SENSOR) MISC [id number] No 1 each by Does not apply route once a week.  Patient not taking: Reported on 11/16/2020   [name], MD Not Taking Consider Medication Status and Discontinue Multiple Informants  Continuous Blood Gluc Sensor ([place]) MISC [id number] Yes by Does not apply route. [provider] Taking Active Multiple Informants  insulin glargine (LANTUS) 100 UNIT/ML Solostar Pen [id number] Yes Inject 18 Units into the skin daily. [provider] Taking Active            Med Note Owens Shark, [place] B   Fri Nov 16, 2020  1:24 PM) Recommend reducing dose to 10 units daily with addition of Farxiga 5 mg daily   Insulin Pen Needle (PEN NEEDLES 5/16") 30G X 8 MM MISC [id number] Yes by Does not apply route. [provider] Taking Active Multiple Informants  metFORMIN (GLUCOPHAGE) 500 MG tablet [id number] Yes Take 2 tablets (1,000 mg total) by mouth 2 (two) times daily with a meal. Start this with new insulin [name], MD Taking Active   Multiple Vitamin (MULTIVITAMIN) tablet [id number] Yes Take 1 tablet by mouth daily. [provider] Taking Active Multiple Informants  rosuvastatin (CRESTOR) 10 MG tablet [id number] Yes Take 10 mg by mouth daily. [provider] Taking Active   sacubitril-valsartan (ENTRESTO) 49-51 MG [id number] Yes Take 1 tablet by mouth 2 (two) times daily.  Nahser, Wonda Cheng, MD Taking Active Multiple Informants          Patient Active Problem List   Diagnosis Date Noted  . Fatigue 05/22/2020  . BPH with obstruction/lower urinary tract symptoms 05/22/2020  . BMI 23.0-23.9, adult 01/17/2020  . Sick sinus syndrome ([place]) 09/13/2019  . Obesity, diabetes, and hypertension syndrome ([place]) 09/13/2019  . Secondary dystonia 07/04/2019  . Essential hypertension 06/26/2018  . Complete heart block ([place]) 05/26/2017  . Cardiac device in situ 05/26/2017  . Chronic combined systolic and diastolic CHF (congestive heart failure) ([place]) 05/26/2017  . Ischemic cardiomyopathy 05/26/2017  . Allergy to sulfa drugs 12/04/2016  . S/P CABG x 5 09/12/2016  . Mixed hyperlipidemia 09/12/2016  . Coronary artery disease involving native coronary artery of native heart without angina pectoris 09/12/2016  . Pacemaker 06/06/2016  . Long term (current) use of aspirin 12/06/2015  . Mild obesity 12/06/2015  . Other long term (current) drug therapy 12/06/2015  . Mild intermittent asthma with acute exacerbation 12/07/2014  . Parkinson's disease ([place]) 12/07/2014    Immunization History  Administered Date(s) Administered  . Moderna Sars-Covid-2 Vaccination 08/03/2019, 08/31/2019    Conditions to be addressed/monitored:  Hyperlipidemia and Diabetes  Care Plan : [place]  Updates made by Burnice Logan, [place] since 11/16/2020 12:00 AM    Problem: CAD, Diabetes, Hypertension   Priority: High  Onset Date: 08/07/2020    Long-Range Goal: Disease Management   Start Date: 08/07/2020  Expected End Date: 08/07/2021  Recent Progress: On track  Priority: High  Note:   Current Barriers:  . Unable to achieve control of Diabetes   Pharmacist Clinical Goal(s):  [name] Patient will achieve control of diabetes as evidenced by a1c and blood sugar readings through collaboration with PharmD and provider.   Interventions: . 1:1 collaboration with [name], MD  regarding development and update of comprehensive plan of care as evidenced by provider attestation and co-signature . Inter-disciplinary care team collaboration (see longitudinal plan of care) . Comprehensive medication review performed; medication list updated in electronic medical record  Hyperlipidemia: (LDL goal < 55) -Not ideally controlled -Current treatment: .  rosuvastatin 10 mg daily  -Medications previously tried: atorvastatin  -Current dietary patterns: eating healthy and trying to adhere to diabetic diet -Current exercise habits: [employer] 5 days a week normally and PT 2 days weekly for shoulder. Wife broke her arm and  unable to get to gym currently.  -Educated on Cholesterol goals;  Benefits of statin for ASCVD risk reduction; Importance of limiting foods high in cholesterol; Exercise goal of 150 minutes per week; -Counseled on diet and exercise extensively Recommended to continue current medication  Diabetes (A1c goal <7%) -Uncontrolled -Current medications: . Lantus 10 units into the skin daily  . Metformin 1000 mg bid  . Freestyle Libre . Insulin pen needles  . Farxiga 5 mg daily  -Medications previously tried: 70/30 insulin  -Current home glucose readings . fasting glucose: 75-173 . post prandial glucose: 107-300s -Reports hypoglycemic/hyperglycemic symptoms -Current meal patterns:  . breakfast: eggs . lunch: trying to eat balanced meals . dinner: meat and vegetables . snacks: ensure/boost, peanut butter crackers, pretzels . drinks: juice to correct low blood sugar -Current exercise:  [employer] m-f when wife can drive and pt for shoulder/parkinson's -Educated on A1c and blood sugar goals; Complications of diabetes including kidney damage, retinal damage, and cardiovascular disease; Exercise goal of 150 minutes per week; Prevention and management of hypoglycemic episodes; Continuous glucose monitoring; -Counseled to check feet daily and get yearly  eye exams -Counseled on diet and exercise extensively Recommended patient continue to watch blood sugar closely. Start Farxiga 5 mg daily for 4 weeks with goal to increase to 10 mg daily thereafter. Reduce Lantus dose to 10 units with Farxiga 5 mg dose with further goal to decrease Lantus 5 units daily with Farxiga 10 mg daily. Ultimate goal to discontinue Lantus if blood sugar stabilizes.   Patient Goals/Self-Care Activities . Patient will:  - take medications as prescribed focus on medication adherence by using pill box check glucose throughout the day with [facility], document, and provide at future appointments target a minimum of 150 minutes of moderate intensity exercise weekly  Follow Up Plan: Telephone follow up appointment with care management team member scheduled for: 11/20/2020      Medication Assistance: Delene Loll and Lantus obtained through [employer] and [employer] medication assistance program.  Enrollment ends 70/48/8891  Application submitted for Farxiga 5 mg daily. Expected start date 11/28/2020.   Patient's preferred pharmacy is:  [id number] [address] [address]Phone: [phone] Fax: [phone] Uses pill box? Yes Pt endorses excellent compliance  We discussed: Benefits of medication synchronization, packaging and delivery as well as enhanced pharmacist oversight with [employer]. Patient decided to: Continue current medication management strategy  Care Plan and Follow Up Patient Decision:  Patient agrees to Care Plan and Follow-up.  Plan: Telephone follow up appointment with care management team member scheduled for:  11/20/2020 @ 3:30 pm

## 2020-11-13 ENCOUNTER — Ambulatory Visit (INDEPENDENT_AMBULATORY_CARE_PROVIDER_SITE_OTHER): Payer: Medicare Other

## 2020-11-13 ENCOUNTER — Other Ambulatory Visit: Payer: Self-pay

## 2020-11-13 DIAGNOSIS — M8000XA Age-related osteoporosis with current pathological fracture, unspecified site, initial encounter for fracture: Secondary | ICD-10-CM

## 2020-11-13 DIAGNOSIS — I5042 Chronic combined systolic (congestive) and diastolic (congestive) heart failure: Secondary | ICD-10-CM

## 2020-11-13 DIAGNOSIS — E1169 Type 2 diabetes mellitus with other specified complication: Secondary | ICD-10-CM

## 2020-11-13 DIAGNOSIS — E782 Mixed hyperlipidemia: Secondary | ICD-10-CM | POA: Diagnosis not present

## 2020-11-13 DIAGNOSIS — I2510 Atherosclerotic heart disease of native coronary artery without angina pectoris: Secondary | ICD-10-CM | POA: Diagnosis not present

## 2020-11-16 MED ORDER — DAPAGLIFLOZIN PROPANEDIOL 5 MG PO TABS: 5.0000 mg | ORAL_TABLET | Freq: Every day | ORAL | 0 refills | Status: DC

## 2020-11-16 NOTE — Patient Instructions (Addendum)
Visit Information  Goals Addressed   None    Patient Care Plan: CCM Pharmacy Care Plan    Problem Identified: CAD, Diabetes, Hypertension   Priority: High  Onset Date: 08/07/2020    Long-Range Goal: Disease Management   Start Date: 08/07/2020  Expected End Date: 08/07/2021  Recent Progress: On track  Priority: High  Note:   Current Barriers:  . Unable to achieve control of Diabetes   Pharmacist Clinical Goal(s):  [name] Patient will achieve control of diabetes as evidenced by a1c and blood sugar readings through collaboration with PharmD and provider.   Interventions: . 1:1 collaboration with Abigail Miyamoto, MD regarding development and update of comprehensive plan of care as evidenced by provider attestation and co-signature . Inter-disciplinary care team collaboration (see longitudinal plan of care) . Comprehensive medication review performed; medication list updated in electronic medical record  Hyperlipidemia: (LDL goal < 55) -Not ideally controlled -Current treatment: .  rosuvastatin 10 mg daily  -Medications previously tried: atorvastatin  -Current dietary patterns: eating healthy and trying to adhere to diabetic diet -Current exercise habits: [employer] 5 days a week normally and PT 2 days weekly for shoulder. Wife broke her arm and unable to get to gym currently.  -Educated on Cholesterol goals;  Benefits of statin for ASCVD risk reduction; Importance of limiting foods high in cholesterol; Exercise goal of 150 minutes per week; -Counseled on diet and exercise extensively Recommended to continue current medication  Diabetes (A1c goal <7%) -Uncontrolled -Current medications: . Lantus 10 units into the skin daily  . Metformin 1000 mg bid  . Freestyle Libre . Insulin pen needles  . Farxiga 5 mg daily  -Medications previously tried: 70/30 insulin  -Current home glucose readings . fasting glucose: 75-173 . post prandial glucose: 107-300s -Reports  hypoglycemic/hyperglycemic symptoms -Current meal patterns:  . breakfast: eggs . lunch: trying to eat balanced meals . dinner: meat and vegetables . snacks: ensure/boost, peanut butter crackers, pretzels . drinks: juice to correct low blood sugar -Current exercise:  [employer] m-f when wife can drive and pt for shoulder/parkinson's -Educated on A1c and blood sugar goals; Complications of diabetes including kidney damage, retinal damage, and cardiovascular disease; Exercise goal of 150 minutes per week; Prevention and management of hypoglycemic episodes; Continuous glucose monitoring; -Counseled to check feet daily and get yearly eye exams -Counseled on diet and exercise extensively Recommended patient continue to watch blood sugar closely. Start Farxiga 5 mg daily for 4 weeks with goal to increase to 10 mg daily thereafter. Reduce Lantus dose to 10 units with Farxiga 5 mg dose with further goal to decrease Lantus 5 units daily with Farxiga 10 mg daily. Ultimate goal to discontinue Lantus if blood sugar stabilizes.   Patient Goals/Self-Care Activities . Patient will:  - take medications as prescribed focus on medication adherence by using pill box check glucose throughout the day with [facility], document, and provide at future appointments target a minimum of 150 minutes of moderate intensity exercise weekly  Follow Up Plan: Telephone follow up appointment with care management team member scheduled for: 11/20/2020      The patient verbalized understanding of instructions, educational materials, and care plan provided today and declined offer to receive copy of patient instructions, educational materials, and care plan.  Telephone follow up appointment with pharmacy team member scheduled for: 11/20/2020  Earvin Hansen, [facility]     Dapagliflozin tablets What is this medicine? DAPAGLIFLOZIN (DAP a gli FLOE zin) controls blood sugar in people  with diabetes. It is used with  lifestyle changes like diet and exercise. It also treats heart failure and kidney disease. It may lower the risk for treatment of heart failure in the hospital or worsened kidney disease. This medicine may be used for other purposes; ask your health care provider or pharmacist if you have questions. COMMON BRAND NAME(S): [name] What should I tell my health care provider before I take this medicine? They need to know if you have any of these conditions:  dehydration  diabetic ketoacidosis  diet low in salt  eating less due to illness, surgery, dieting, or any other reason  having surgery  history of pancreatitis or pancreas problems  history of yeast infection of the penis or vagina  if you often drink alcohol  infection in the bladder, kidneys, or urinary tract  kidney disease  low blood pressure  on dialysis  problems urinating  type 1 diabetes  uncircumcised male  an unusual or allergic reaction to dapagliflozin, other medicines, foods, dyes, or preservatives  pregnant or trying to get pregnant  breast-feeding How should I use this medicine? Take this medicine by mouth with water. Take it as directed on the prescription label at the same time every day. You can take it with or without food. If it upsets your stomach, take it with food. Keep taking it unless your health care provider tells you to stop. A special MedGuide will be given to you by the pharmacist with each prescription and refill. Be sure to read this information carefully each time. Talk to your health care provider about the use of this medicine in children. Special care may be needed. Overdosage: If you think you have taken too much of this medicine contact a poison control center or emergency room at once. NOTE: This medicine is only for you. Do not share this medicine with others. What if I miss a dose? If you miss a dose, take it as soon as you can. If it is almost time for your next dose, take  only that dose. Do not take double or extra doses. What may interact with this medicine? Interactions are not expected. This list may not describe all possible interactions. Give your health care provider a list of all the medicines, herbs, non-prescription drugs, or dietary supplements you use. Also tell them if you smoke, drink alcohol, or use illegal drugs. Some items may interact with your medicine. What should I watch for while using this medicine? Visit your health care provider for regular checks on your progress. Tell your health care provider if your symptoms do not start to get better or if they get worse. This medicine can cause a serious condition in which there is too much acid in the blood. If you develop nausea, vomiting, stomach pain, unusual tiredness, or breathing problems, stop taking this medicine and call your doctor right away. If possible, use a ketone dipstick to check for ketones in your urine. Check with your health care provider if you have severe diarrhea, nausea, and vomiting, or if you sweat a lot. The loss of too much body fluid may make it dangerous for you to take this medicine. A test called the HbA1C (A1C) will be monitored. This is a simple blood test. It measures your blood sugar control over the last 2 to 3 months. You will receive this test every 3 to 6 months. Learn how to check your blood sugar. Learn the symptoms of low and high blood sugar and how  to manage them. Always carry a quick-source of sugar with you in case you have symptoms of low blood sugar. Examples include hard sugar candy or glucose tablets. Make sure others know that you can choke if you eat or drink when you develop serious symptoms of low blood sugar, such as seizures or unconsciousness. Get medical help at once. Tell your health care provider if you have high blood sugar. You might need to change the dose of your medicine. If you are sick or exercising more than usual, you may need to change  the dose of your medicine. Do not skip meals. Ask your health care provider if you should avoid alcohol. Many nonprescription cough and cold products contain sugar or alcohol. These can affect blood sugar. Wear a medical ID bracelet or chain. Carry a card that describes your condition. List the medicines and doses you take on the card. What side effects may I notice from receiving this medicine? Side effects that you should report to your doctor or health care professional as soon as possible:  allergic reactions (skin rash, itching or hives, swelling of the face, lips, or tongue)  breathing problems  dizziness  feeling faint or lightheaded, falls  genital infection (fever; tenderness, redness, or swelling in the genitals or area from the genitals to the back of the rectum)  kidney injury (trouble passing urine or change in the amount of urine)  low blood sugar (feeling anxious; confusion; dizziness; increased hunger; unusually weak or tired; increased sweating; shakiness; cold, clammy skin; irritable; headache; blurred vision; fast heartbeat; loss of consciousness)  muscle weakness  nausea, vomiting, unusual stomach upset or pain  new pain or tenderness, change in skin color, sores or ulcers, or infection in legs or feet  penile discharge, itching, or pain  unusual tiredness  unusual vaginal discharge, itching, or odor  urinary tract infection (fever; chills; a burning feeling when urinating; urgent need to urinate more often; blood in the urine; back pain) Side effects that usually do not require medical attention (report to your doctor or health care professional if they continue or are bothersome):  mild increase in urination  thirsty This list may not describe all possible side effects. Call your doctor for medical advice about side effects. You may report side effects to FDA at 1-800-FDA-1088. Where should I keep my medicine? Keep out of the reach of children and  pets. Store at room temperature between 20 and 25 degrees C (68 and 77 degrees F). Get rid of any unused medicine after the expiration date. To get rid of medicines that are no longer needed or have expired:  Take the medicine to a medicine take-back program. Check with your pharmacy or law enforcement to find a location.  If you cannot return the medicine, check the label or package insert to see if the medicine should be thrown out in the garbage or flushed down the toilet. If you are not sure, ask your health care provider. If it is safe to put it in the trash, take the medicine out of the container. Mix the medicine with cat litter, dirt, coffee grounds, or other unwanted substance. Seal the mixture in a bag or container. Put it in the trash. NOTE: This sheet is a summary. It may not cover all possible information. If you have questions about this medicine, talk to your doctor, pharmacist, or health care provider.  2021 Elsevier/Gold Standard (2019-11-23 13:18:47)

## 2020-11-19 NOTE — Progress Notes (Signed)
Chronic Care Management Pharmacy Note  11/22/2020 Name:  Jeffrey Parsons MRN:  [id number] DOB:  Oct 03, 1950   Plan Recommendations:   Patient's blood sugar is stabilizing with addition of Farxiga 5 mg daily. Patient has not had overnight low blood sugar since reducing lantus to 10 units and introducing Farxiga 5 mg daily. Recommend considering increase to 10 mg and reducing Lantus to 5 units after 4 weeks. Pharmacist will follow-up to ensure tolerating Farxiga prior to increase.   Patient mentions that he has continued weight loss and notes about 15 lbs in the last month. Pharmacist coordinates visit with Dr. [name] to address.   Subjective: Jeffrey Parsons is an 70 y.o. year old male who is a primary patient of [name], Zeb Comfort, MD.  The CCM team was consulted for assistance with disease management and care coordination needs.    Engaged with patient by telephone for follow up visit in response to provider referral for pharmacy case management and/or care coordination services.   Consent to Services:  The patient was given information about Chronic Care Management services, agreed to services, and gave verbal consent prior to initiation of services.  Please see initial visit note for detailed documentation.   Patient Care Team: [name], MD as PCP - General (Family Medicine) Nahser, Wonda Cheng, MD as PCP - Cardiology (Cardiology) Deboraha Sprang, MD as PCP - Electrophysiology (Cardiology) Burnice Logan, [facility] (Pharmacist)  Recent office visits: [id number] - no changes. Continue Crestor.   Recent consult visits: 09/03/2020 - neuro - refer to PT for PD and balance. Reduce topiramate to 1 tab in the morning. After 2 weeks if no worsening of your tremor, please stop taking altogether. Continue to exercise. Consider lowering bp medications as much as possible. People with PD have low bp and are sensitive to low bp.    Hospital visits:   Objective:  Lab Results   Component Value Date   CREATININE 0.86 08/30/2020   BUN 22 08/30/2020   GFRNONAA 76 05/03/2020   GFRAA 87 05/03/2020   NA 136 08/30/2020   K 5.0 08/30/2020   CALCIUM 9.1 08/30/2020   CO2 19 (L) 08/30/2020    Lab Results  Component Value Date/Time   HGBA1C 7.8 (H) 08/30/2020 08:47 AM   HGBA1C 8.6 (H) 05/07/2020 10:38 AM   MICROALBUR 80 01/17/2020 08:38 AM    Last diabetic Eye exam: No results found for: HMDIABEYEEXA  Last diabetic Foot exam: No results found for: HMDIABFOOTEX   Lab Results  Component Value Date   CHOL 121 08/30/2020   HDL 46 08/30/2020   LDLCALC 61 08/30/2020   TRIG 67 08/30/2020   CHOLHDL 2.6 08/30/2020    Hepatic Function Latest Ref Rng & Units 08/30/2020 05/03/2020 01/17/2020  Total Protein 6.0 - 8.5 g/dL 6.3 6.5 6.8  Albumin 3.8 - 4.8 g/dL 4.1 4.4 4.1  AST 0 - 40 IU/L 14 14 15   ALT 0 - 44 IU/L 4 2 4   Alk Phosphatase 44 - 121 IU/L 106 121 114  Total Bilirubin 0.0 - 1.2 mg/dL 0.7 1.0 0.7  Bilirubin, Direct 0.00 - 0.40 mg/dL - 0.23 -    Lab Results  Component Value Date/Time   TSH 1.440 05/22/2020 08:49 AM    CBC Latest Ref Rng & Units 08/30/2020 03/29/2020 01/17/2020  WBC 3.4 - 10.8 x10E3/uL 7.1 7.0 8.2  Hemoglobin 13.0 - 17.7 g/dL 13.2 12.8(L) 13.7  Hematocrit 37.5 - 51.0 % 40.4 38.1(L) 40.7  Platelets 150 - 450 x10E3/uL  160 176 165    No results found for: VD25OH  Clinical ASCVD: Yes  The ASCVD Risk score Mikey Bussing DC Jr., et al., 2013) failed to calculate for the following reasons:   The patient has a prior MI or stroke diagnosis    Depression screen [facility] 2/9 01/17/2020  Decreased Interest 0  Down, Depressed, Hopeless 0  PHQ - 2 Score 0  Altered sleeping 0  Tired, decreased energy 1  Change in appetite 0  Feeling bad or failure about yourself  0  Trouble concentrating 0  Moving slowly or fidgety/restless 1  Suicidal thoughts 0  PHQ-9 Score 2  Difficult doing work/chores Not difficult at all     Other: (CHADS2VASc if Afib, MMRC or CAT  for COPD, ACT, DEXA)  Social History   Tobacco Use  Smoking Status Never Smoker  Smokeless Tobacco Never Used   BP Readings from Last 3 Encounters:  10/29/20 116/60  08/30/20 (!) 120/58  07/30/20 118/62   Pulse Readings from Last 3 Encounters:  10/29/20 88  08/30/20 94  07/30/20 92   Wt Readings from Last 3 Encounters:  10/29/20 150 lb 3.2 oz (68.1 kg)  08/30/20 151 lb (68.5 kg)  07/30/20 155 lb (70.3 kg)    Assessment/Interventions: Review of patient past medical history, allergies, medications, health status, including review of consultants reports, laboratory and other test data, was performed as part of comprehensive evaluation and provision of chronic care management services.   SDOH:  (Social Determinants of Health) assessments and interventions performed: Yes   CCM Care Plan  Allergies  Allergen Reactions  . Onion     Upset stomach  . Sulfa Antibiotics Other (See Comments)    FLUSH     Medications Reviewed Today    Reviewed by Burnice Logan, [facility] (Pharmacist) on 11/22/20 at [place] List Status: <None>  Medication Order Taking? Sig Documenting Provider Last Dose Status Informant  acetaminophen (TYLENOL) 500 MG tablet [id number] Yes Take 500 mg by mouth daily as needed for moderate pain or headache.  [provider] Taking Active Multiple Informants  albuterol (VENTOLIN HFA) 108 (90 Base) MCG/ACT inhaler [id number] Yes Inhale 2 puffs into the lungs every 6 (six) hours as needed for wheezing or shortness of breath. [provider] Taking Active Multiple Informants  aspirin EC 81 MG tablet [id number] Yes Take 1 tablet (81 mg total) by mouth daily. Richardson Dopp T, PA-C Taking Active Multiple Informants  carbidopa-levodopa (SINEMET CR) 50-200 MG tablet [id number] Yes Take 1 tablet by mouth every evening.  [provider] Taking Active Multiple Informants  carbidopa-levodopa (SINEMET IR) 25-250 MG tablet [id number] Yes Take 1 tablet by mouth 4  (four) times daily.  [provider] Taking Active Multiple Informants  carvedilol (COREG) 12.5 MG tablet [id number] Yes Take 1 tablet (12.5 mg total) by mouth 2 (two) times daily. Nahser, Wonda Cheng, MD Taking Active   Continuous Blood Gluc Sensor (FREESTYLE LIBRE 2 SENSOR) [place] [id number] Yes by Does not apply route. [provider] Taking Active Multiple Informants  dapagliflozin propanediol (FARXIGA) 5 MG TABS tablet [id number] Yes Take 1 tablet (5 mg total) by mouth daily before breakfast. [name], MD Taking Active   insulin glargine (LANTUS) 100 UNIT/ML Solostar Pen [id number] Yes Inject 10 Units into the skin daily. [provider] Taking Active            Med Note Owens Shark, [place] B   Fri Nov 16, 2020  1:24 PM) Recommend reducing dose  to 10 units daily with addition of Farxiga 5 mg daily   Insulin Pen Needle (PEN NEEDLES 5/16") 30G X 8 MM MISC [id number] Yes by Does not apply route. [provider] Taking Active Multiple Informants  metFORMIN (GLUCOPHAGE) 500 MG tablet [id number] Yes Take 2 tablets (1,000 mg total) by mouth 2 (two) times daily with a meal. Start this with new insulin [name], MD Taking Active   Multiple Vitamin (MULTIVITAMIN) tablet [id number] Yes Take 1 tablet by mouth daily. [provider] Taking Active Multiple Informants  rosuvastatin (CRESTOR) 10 MG tablet [id number] Yes Take 10 mg by mouth daily. [provider] Taking Active   sacubitril-valsartan (ENTRESTO) 49-51 MG [id number] Yes Take 1 tablet by mouth 2 (two) times daily. Nahser, Wonda Cheng, MD Taking Active Multiple Informants          Patient Active Problem List   Diagnosis Date Noted  . Fatigue 05/22/2020  . BPH with obstruction/lower urinary tract symptoms 05/22/2020  . BMI 23.0-23.9, adult 01/17/2020  . Sick sinus syndrome ([place]) 09/13/2019  . Obesity, diabetes, and hypertension syndrome ([place]) 09/13/2019  . Secondary dystonia 07/04/2019   . Essential hypertension 06/26/2018  . Complete heart block ([place]) 05/26/2017  . Cardiac device in situ 05/26/2017  . Chronic combined systolic and diastolic CHF (congestive heart failure) ([place]) 05/26/2017  . Ischemic cardiomyopathy 05/26/2017  . Allergy to sulfa drugs 12/04/2016  . S/P CABG x 5 09/12/2016  . Mixed hyperlipidemia 09/12/2016  . Coronary artery disease involving native coronary artery of native heart without angina pectoris 09/12/2016  . Pacemaker 06/06/2016  . Long term (current) use of aspirin 12/06/2015  . Mild obesity 12/06/2015  . Other long term (current) drug therapy 12/06/2015  . Mild intermittent asthma with acute exacerbation 12/07/2014  . Parkinson's disease ([place]) 12/07/2014    Immunization History  Administered Date(s) Administered  . Moderna Sars-Covid-2 Vaccination 08/03/2019, 08/31/2019    Conditions to be addressed/monitored:  Hyperlipidemia and Diabetes  Care Plan : [place]  Updates made by Burnice Logan, [place] since 11/22/2020 12:00 AM    Problem: CAD, Diabetes, Hypertension   Priority: High  Onset Date: 08/07/2020    Long-Range Goal: Disease Management   Start Date: 08/07/2020  Expected End Date: 08/07/2021  Recent Progress: On track  Priority: High  Note:   Current Barriers:  . Unable to achieve control of Diabetes   Pharmacist Clinical Goal(s):  [name] Patient will achieve control of diabetes as evidenced by a1c and blood sugar readings through collaboration with PharmD and provider.   Interventions: . 1:1 collaboration with [name], MD regarding development and update of comprehensive plan of care as evidenced by provider attestation and co-signature . Inter-disciplinary care team collaboration (see longitudinal plan of care) . Comprehensive medication review performed; medication list updated in electronic medical record  Hyperlipidemia: (LDL goal < 55) -Not ideally controlled -Current treatment: .   rosuvastatin 10 mg daily  -Medications previously tried: atorvastatin  -Current dietary patterns: eating healthy and trying to adhere to diabetic diet -Current exercise habits: [employer] 5 days a week normally and PT 2 days weekly for shoulder. Wife broke her arm and unable to get to gym currently.  -Educated on Cholesterol goals;  Benefits of statin for ASCVD risk reduction; Importance of limiting foods high in cholesterol; Exercise goal of 150 minutes per week; -Counseled on diet and exercise extensively Recommended to continue current medication  Diabetes (A1c goal <7%) -Uncontrolled -Current medications: . Lantus 10  units into the skin daily  . Metformin 1000 mg bid  . Freestyle Libre . Insulin pen needles  . Farxiga 5 mg daily  -Medications previously tried: 70/30 insulin  -Current home glucose readings . fasting glucose: 149-235 . post prandial glucose: 132-201 -Reports hypoglycemic/hyperglycemic symptoms -Current meal patterns:  . breakfast: eggs . lunch: trying to eat balanced meals . dinner: meat and vegetables . snacks: ensure/boost, peanut butter crackers, pretzels . drinks: juice to correct low blood sugar -Current exercise:  [employer] m-f when wife can drive and pt for shoulder/parkinson's -Educated on A1c and blood sugar goals; Complications of diabetes including kidney damage, retinal damage, and cardiovascular disease; Exercise goal of 150 minutes per week; Prevention and management of hypoglycemic episodes; Continuous glucose monitoring; -Counseled to check feet daily and get yearly eye exams -Counseled on diet and exercise extensively Recommended patient continue to watch blood sugar closely. Reduce Lantus dose to 10 units with Farxiga 5 mg dose with further goal to decrease Lantus 5 units daily with Farxiga 10 mg daily. Ultimate goal to discontinue Lantus if blood sugar stabilizes. Patient reports weight loss of 15 lbs in last month. Pharmacist  coordinated visit with Dr. [name].   Patient Goals/Self-Care Activities . Patient will:  - take medications as prescribed focus on medication adherence by using pill box check glucose throughout the day with [facility], document, and provide at future appointments target a minimum of 150 minutes of moderate intensity exercise weekly  Follow Up Plan: Telephone follow up appointment with care management team member scheduled for: 12/04/2020      Medication Assistance: Delene Loll and Lantus obtained through [employer] and [employer] medication assistance program.  Enrollment ends 11/65/7903  Application submitted for Farxiga 5 mg daily. Expected start date 11/28/2020.   Patient's preferred pharmacy is:  [id number] [place] [address] [place] [address]Phone: [phone] Fax: [phone] Uses pill box? Yes Pt endorses excellent compliance  We discussed: Benefits of medication synchronization, packaging and delivery as well as enhanced pharmacist oversight with [employer]. Patient decided to: Continue current medication management strategy  Care Plan and Follow Up Patient Decision:  Patient agrees to Care Plan and Follow-up.  Plan: Telephone follow up appointment with care management team member scheduled for:  12/04/2020 @ 12 pm

## 2020-11-20 ENCOUNTER — Other Ambulatory Visit: Payer: Self-pay

## 2020-11-20 ENCOUNTER — Ambulatory Visit: Payer: Medicare Other

## 2020-11-20 DIAGNOSIS — I5042 Chronic combined systolic (congestive) and diastolic (congestive) heart failure: Secondary | ICD-10-CM

## 2020-11-20 DIAGNOSIS — I2510 Atherosclerotic heart disease of native coronary artery without angina pectoris: Secondary | ICD-10-CM

## 2020-11-20 DIAGNOSIS — E1169 Type 2 diabetes mellitus with other specified complication: Secondary | ICD-10-CM | POA: Diagnosis not present

## 2020-11-20 DIAGNOSIS — E782 Mixed hyperlipidemia: Secondary | ICD-10-CM

## 2020-11-21 ENCOUNTER — Telehealth: Payer: Self-pay

## 2020-11-21 NOTE — Telephone Encounter (Signed)
Per Maralyn Sago, [facility] "Patient needs an acute visit per [facility] to assess continued weight loss. Do you have anything sooner than end of June? He left me a message about getting in sooner." I left a message on the number listed in his chart requesting him to call the office back about getting an appointment.

## 2020-11-22 NOTE — Patient Instructions (Addendum)
Visit Information  Goals Addressed   None    Patient Care Plan: CCM Pharmacy Care Plan    Problem Identified: CAD, Diabetes, Hypertension   Priority: High  Onset Date: 08/07/2020    Long-Range Goal: Disease Management   Start Date: 08/07/2020  Expected End Date: 08/07/2021  Recent Progress: On track  Priority: High  Note:   Current Barriers:  . Unable to achieve control of Diabetes   Pharmacist Clinical Goal(s):  [name] Patient will achieve control of diabetes as evidenced by a1c and blood sugar readings through collaboration with PharmD and provider.   Interventions: . 1:1 collaboration with Abigail Miyamoto, MD regarding development and update of comprehensive plan of care as evidenced by provider attestation and co-signature . Inter-disciplinary care team collaboration (see longitudinal plan of care) . Comprehensive medication review performed; medication list updated in electronic medical record  Hyperlipidemia: (LDL goal < 55) -Not ideally controlled -Current treatment: .  rosuvastatin 10 mg daily  -Medications previously tried: atorvastatin  -Current dietary patterns: eating healthy and trying to adhere to diabetic diet -Current exercise habits: [employer] 5 days a week normally and PT 2 days weekly for shoulder. Wife broke her arm and unable to get to gym currently.  -Educated on Cholesterol goals;  Benefits of statin for ASCVD risk reduction; Importance of limiting foods high in cholesterol; Exercise goal of 150 minutes per week; -Counseled on diet and exercise extensively Recommended to continue current medication  Diabetes (A1c goal <7%) -Uncontrolled -Current medications: . Lantus 10 units into the skin daily  . Metformin 1000 mg bid  . Freestyle Libre . Insulin pen needles  . Farxiga 5 mg daily  -Medications previously tried: 70/30 insulin  -Current home glucose readings . fasting glucose: 149-235 . post prandial glucose: 132-201 -Reports  hypoglycemic/hyperglycemic symptoms -Current meal patterns:  . breakfast: eggs . lunch: trying to eat balanced meals . dinner: meat and vegetables . snacks: ensure/boost, peanut butter crackers, pretzels . drinks: juice to correct low blood sugar -Current exercise:  [employer] m-f when wife can drive and pt for shoulder/parkinson's -Educated on A1c and blood sugar goals; Complications of diabetes including kidney damage, retinal damage, and cardiovascular disease; Exercise goal of 150 minutes per week; Prevention and management of hypoglycemic episodes; Continuous glucose monitoring; -Counseled to check feet daily and get yearly eye exams -Counseled on diet and exercise extensively Recommended patient continue to watch blood sugar closely. Reduce Lantus dose to 10 units with Farxiga 5 mg dose with further goal to decrease Lantus 5 units daily with Farxiga 10 mg daily. Ultimate goal to discontinue Lantus if blood sugar stabilizes. Patient reports weight loss of 15 lbs in last month. Pharmacist coordinated visit with Dr. Marina Goodell.   Patient Goals/Self-Care Activities . Patient will:  - take medications as prescribed focus on medication adherence by using pill box check glucose throughout the day with [facility], document, and provide at future appointments target a minimum of 150 minutes of moderate intensity exercise weekly  Follow Up Plan: Telephone follow up appointment with care management team member scheduled for: 12/04/2020      Patient verbalizes understanding of instructions provided today and agrees to view in MyChart.  Telephone follow up appointment with pharmacy team member scheduled for: 12/04/2020  Earvin Hansen, [facility]  Blood Glucose Monitoring, Adult Monitoring your blood sugar (glucose) is an important part of managing your diabetes. Blood glucose monitoring involves checking your blood glucose as often as directed and keeping a log or record  of your results over  time. Checking your blood glucose regularly and keeping a blood glucose log can:  Help you and your health care provider adjust your diabetes management plan as needed, including your medicines or insulin.  Help you understand how food, exercise, illnesses, and medicines affect your blood glucose.  Let you know what your blood glucose is at any time. You can quickly find out if you have low blood glucose (hypoglycemia) or high blood glucose (hyperglycemia). Your health care provider will set individualized treatment goals for you. Your goals will be based on your age, other medical conditions you have, and how you respond to diabetes treatment. Generally, the goal of treatment is to maintain the following blood glucose levels:  Before meals (preprandial): 80-130 mg/dL (0.8-1.4 mmol/L).  After meals (postprandial): below 180 mg/dL (10 mmol/L).  A1C level: less than 7%. Supplies needed:  Blood glucose meter.  Test strips for your meter. Each meter has its own strips. You must use the strips that came with your meter.  A needle to prick your finger (lancet). Do not use a lancet more than one time.  A device that holds the lancet (lancing device).  A journal or log book to write down your results. How to check your blood glucose Checking your blood glucose 1. Wash your hands for at least 20 seconds with soap and water. 2. Prick the side of your finger (not the tip) with the lancet. Do not use the same finger consecutively. 3. Gently rub the finger until a small drop of blood appears. 4. Follow instructions that come with your meter for inserting the test strip, applying blood to the strip, and using your blood glucose meter. 5. Write down your result and any notes in your log.   Using alternative sites Some meters allow you to use areas of your body other than your finger (alternative sites) to test your blood. The most common alternative sites are the forearm, the thigh, and the palm  of your hand. Alternative sites may not be as accurate as the fingers because blood flow is slower in those areas. This means that the result you get may be delayed, and it may be different from the result that you would get from your finger. Use the finger only, and do not use alternative sites, if:  You think you have hypoglycemia.  You sometimes do not know that your blood glucose is getting low (hypoglycemia unawareness). General tips and recommendations Blood glucose log  Every time you check your blood glucose, write down your result. Also write down any notes about things that may be affecting your blood glucose, such as your diet and exercise for the day. This information can help you and your health care provider: ? Look for patterns in your blood glucose over time. ? Adjust your diabetes management plan as needed.  Check if your meter allows you to download your records to a computer or if there is an app for the meter. Most glucose meters store a record of glucose readings in the meter.   If you have type 1 diabetes:  Check your blood glucose 4 or more times a day if you are on intensive insulin therapy with multiple daily injections (MDI) or if you are using an insulin pump. Check your blood glucose: ? Before every meal and snack. ? Before bedtime.  Also check your blood glucose: ? If you have symptoms of hypoglycemia. ? After treating low blood glucose. ? Before doing activities  that create a risk for injury, like driving or using machinery. ? Before and after exercise. ? Two hours after a meal. ? Occasionally between 2:00 a.m. and 3:00 a.m., as directed.  You may need to check your blood glucose more often, 6-10 times per day, if: ? You have diabetes that is not well controlled. ? You are ill. ? You have a history of severe hypoglycemia. ? You have hypoglycemia unawareness. If you have type 2 diabetes:  Check your blood glucose 2 or more times a day if you take  insulin or other diabetes medicines.  Check your blood glucose 4 or more times a day if you are on intensive insulin therapy. Occasionally, you may also need to check your glucose between 2:00 a.m. and 3:00 a.m., as directed.  Also check your blood glucose: ? Before and after exercise. ? Before doing activities that create a risk for injury, like driving or using machinery.  You may need to check your blood glucose more often if: ? Your medicine is being adjusted. ? Your diabetes is not well controlled. ? You are ill. General tips  Make sure you always have your supplies with you.  After you use a few boxes of test strips, adjust (calibrate) your blood glucose meter by following instructions that came with your meter.  If you have questions or need help, all blood glucose meters have a 24-hour hotline phone number available that you can call. Also contact your health care provider with questions or concerns you may have. Where to find more information  The American Diabetes Association: www.diabetes.org  The Association of Diabetes Care & Education Specialists: www.diabeteseducator.org Contact a health care provider if:  Your blood glucose is at or above 240 mg/dL (75.1 mmol/L) for 2 days in a row.  You have been sick or have had a fever for 2 days or longer, and you are not getting better.  You have any of the following problems for more than 6 hours: ? You cannot eat or drink. ? You have nausea or vomiting. ? You have diarrhea. Get help right away if:  Your blood glucose is lower than 54 mg/dL (3 mmol/L).  You become confused, or you have trouble thinking clearly.  You have difficulty breathing.  You have moderate or large ketone levels in your urine. These symptoms may represent a serious problem that is an emergency. Do not wait to see if the symptoms will go away. Get medical help right away. Call your local emergency services (911 in the [place] Do not drive yourself  to the hospital. Summary  Monitoring your blood glucose is an important part of managing your diabetes.  Blood glucose monitoring involves checking your blood glucose as often as directed and keeping a log or record of your results over time.  Your health care provider will set individualized treatment goals for you. Your goals will be based on your age, other medical conditions you have, and how you respond to diabetes treatment.  Every time you check your blood glucose, write down your result. Also, write down any notes about things that may be affecting your blood glucose, such as your diet and exercise for the day. This information is not intended to replace advice given to you by your health care provider. Make sure you discuss any questions you have with your health care provider. Document Revised: 03/14/2020 Document Reviewed: 03/14/2020 Elsevier Patient Education  2021 [employer].

## 2020-11-23 ENCOUNTER — Telehealth: Payer: Self-pay | Admitting: Legal Medicine

## 2020-11-23 ENCOUNTER — Encounter: Payer: Self-pay | Admitting: Legal Medicine

## 2020-11-23 ENCOUNTER — Other Ambulatory Visit: Payer: Self-pay

## 2020-11-23 ENCOUNTER — Ambulatory Visit (INDEPENDENT_AMBULATORY_CARE_PROVIDER_SITE_OTHER): Payer: Medicare Other | Admitting: Legal Medicine

## 2020-11-23 VITALS — BP 124/64 | HR 93 | Temp 97.6°F | Resp 16 | Ht 66.0 in | Wt 142.0 lb

## 2020-11-23 DIAGNOSIS — E669 Obesity, unspecified: Secondary | ICD-10-CM | POA: Diagnosis not present

## 2020-11-23 DIAGNOSIS — E1169 Type 2 diabetes mellitus with other specified complication: Secondary | ICD-10-CM

## 2020-11-23 DIAGNOSIS — I152 Hypertension secondary to endocrine disorders: Secondary | ICD-10-CM | POA: Diagnosis not present

## 2020-11-23 DIAGNOSIS — R634 Abnormal weight loss: Secondary | ICD-10-CM | POA: Diagnosis not present

## 2020-11-23 DIAGNOSIS — E119 Type 2 diabetes mellitus without complications: Secondary | ICD-10-CM | POA: Diagnosis not present

## 2020-11-23 DIAGNOSIS — E1159 Type 2 diabetes mellitus with other circulatory complications: Secondary | ICD-10-CM | POA: Diagnosis not present

## 2020-11-23 DIAGNOSIS — G20 Parkinson's disease: Secondary | ICD-10-CM

## 2020-11-23 NOTE — Progress Notes (Signed)
Subjective:  Patient ID: Jeffrey Parsons, male    DOB: [birth year]  Age: 70 y.o. MRN: [id number]  Chief Complaint  Patient presents with  . Weight Loss    Patient noticed lost 15 pounds since 2 months ago according with his scale.    HPI: patient losing weight 156 one year ago, 150 last visit , 151 in march and 142 today. He was started on farxiga recently. He has parkinson and CAD, he recenty got a pacemaker.  He has fracture in December. Appetite good.  He is using boost between meals  Diabetes bs high yesterday.recently on farxiga plan to stop for now.   Current Outpatient Medications on File Prior to Visit  Medication Sig Dispense Refill  . acetaminophen (TYLENOL) 500 MG tablet Take 500 mg by mouth daily as needed for moderate pain or headache.     . albuterol (VENTOLIN HFA) 108 (90 Base) MCG/ACT inhaler Inhale 2 puffs into the lungs every 6 (six) hours as needed for wheezing or shortness of breath.    [name] aspirin EC 81 MG tablet Take 1 tablet (81 mg total) by mouth daily.    . carbidopa-levodopa (SINEMET CR) 50-200 MG tablet Take 1 tablet by mouth every evening.     . carbidopa-levodopa (SINEMET IR) 25-250 MG tablet Take 1 tablet by mouth 4 (four) times daily.     . carvedilol (COREG) 12.5 MG tablet Take 1 tablet (12.5 mg total) by mouth 2 (two) times daily. 180 tablet 3  . Continuous Blood Gluc Sensor (FREESTYLE LIBRE 2 SENSOR) MISC by Does not apply route.    . insulin glargine (LANTUS) 100 UNIT/ML Solostar Pen Inject 10 Units into the skin daily.    . Insulin Pen Needle (PEN NEEDLES 5/16") 30G X 8 MM MISC by Does not apply route.    . metFORMIN (GLUCOPHAGE) 500 MG tablet Take 2 tablets (1,000 mg total) by mouth 2 (two) times daily with a meal. Start this with new insulin 360 tablet 2  . Multiple Vitamin (MULTIVITAMIN) tablet Take 1 tablet by mouth daily.    . rosuvastatin (CRESTOR) 10 MG tablet Take 10 mg by mouth daily.    . sacubitril-valsartan (ENTRESTO) 49-51 MG Take 1 tablet  by mouth 2 (two) times daily. 180 tablet 3   No current facility-administered medications on file prior to visit.   Past Medical History:  Diagnosis Date  . AICD (automatic cardioverter/defibrillator) present   . Complete heart block (HCC) 05/26/2017  . Coronary artery disease involving native coronary artery of native heart without angina pectoris 09/12/2016  . Ischemic cardiomyopathy 05/26/2017  . Pneumonia 2019  . Retinopathy    Past Surgical History:  Procedure Laterality Date  . BI-VENTRICULAR IMPLANTABLE CARDIOVERTER DEFIBRILLATOR UPGRADE  06/25/2018  . BIV UPGRADE N/A 06/25/2018   Procedure: BIV ICD UPGRADE;  Surgeon: Duke Salvia, MD;  Location: [facility] INVASIVE CV LAB;  Service: Cardiovascular;  Laterality: N/A;  . CARDIAC CATHETERIZATION  2001; ~ 2013  . CATARACT EXTRACTION W/ INTRAOCULAR LENS  IMPLANT, BILATERAL Bilateral   . CORONARY ARTERY BYPASS GRAFT  2001   "CABG X5"  . INSERT / REPLACE / REMOVE PACEMAKER  05/2010  . KNEE SURGERY Left    AS A CHILD  . R shoulder surgery  05/2020  . RIGHT/LEFT HEART CATH AND CORONARY/GRAFT ANGIOGRAPHY N/A 06/16/2017   Procedure: RIGHT/LEFT HEART CATH AND CORONARY/GRAFT ANGIOGRAPHY;  Surgeon: Tonny Bollman, MD;  Location: [place] [facility] INVASIVE CV LAB;  Service: Cardiovascular;  Laterality: N/A;  . TONSILLECTOMY  Family History  Problem Relation Age of Onset  . Bronchitis Mother   . Coronary artery disease Father   . Heart disease Father   . Hypertension Sister   . Hypertension Brother    Social History   Socioeconomic History  . Marital status: Married    Spouse name: Not on file  . Number of children: Not on file  . Years of education: Not on file  . Highest education level: Not on file  Occupational History  . Not on file  Tobacco Use  . Smoking status: Never Smoker  . Smokeless tobacco: Never Used  Vaping Use  . Vaping Use: Never used  Substance and Sexual Activity  . Alcohol use: Never  . Drug use: Never  . Sexual  activity: Yes  Other Topics Concern  . Not on file  Social History Narrative  . Not on file   Social Determinants of Health   Financial Resource Strain: Not on file  Food Insecurity: No Food Insecurity  . Worried About Programme researcher, broadcasting/film/video in the Last Year: Never true  . Ran Out of Food in the Last Year: Never true  Transportation Needs: No Transportation Needs  . Lack of Transportation (Medical): No  . Lack of Transportation (Non-Medical): No  Physical Activity: Insufficiently Active  . Days of Exercise per Week: 3 days  . Minutes of Exercise per Session: 30 min  Stress: Not on file  Social Connections: Socially Integrated  . Frequency of Communication with Friends and Family: More than three times a week  . Frequency of Social Gatherings with Friends and Family: Twice a week  . Attends Religious Services: More than 4 times per year  . Active Member of Clubs or Organizations: Yes  . Attends Banker Meetings: More than 4 times per year  . Marital Status: Married    Review of Systems  Constitutional: Positive for unexpected weight change. Negative for activity change and appetite change.  HENT: Negative for congestion and rhinorrhea.   Eyes: Negative for visual disturbance.  Respiratory: Negative for chest tightness and shortness of breath.   Cardiovascular: Negative for chest pain, palpitations and leg swelling.  Gastrointestinal: Negative for abdominal distention and abdominal pain.  Endocrine: Positive for polyuria.  Genitourinary: Negative for difficulty urinating and urgency.  Musculoskeletal: Negative for arthralgias and back pain.  Skin: Negative.   Neurological: Negative.   Psychiatric/Behavioral: Negative.      Objective:  BP 124/64   Pulse 93   Temp 97.6 F (36.4 C)   Resp 16   Ht 5\' 6"  (1.676 m)   Wt 142 lb (64.4 kg)   SpO2 94%   BMI 22.92 kg/m   BP/Weight 11/23/2020 10/29/2020 08/30/2020  Systolic BP 124 116 120  Diastolic BP 64 60 58  Wt.  (Lbs) 142 150.2 151  BMI 22.92 24.24 22.96    Physical Exam Vitals reviewed.  Constitutional:      Appearance: Normal appearance. He is ill-appearing.  HENT:     Right Ear: Tympanic membrane normal.     Left Ear: Tympanic membrane normal.  Eyes:     Extraocular Movements: Extraocular movements intact.     Conjunctiva/sclera: Conjunctivae normal.     Pupils: Pupils are equal, round, and reactive to light.  Cardiovascular:     Rate and Rhythm: Normal rate.     Pulses: Normal pulses.     Heart sounds: Normal heart sounds. No murmur heard. No gallop.   Pulmonary:  Effort: Pulmonary effort is normal.     Breath sounds: Normal breath sounds.  Abdominal:     General: Abdomen is flat. Bowel sounds are normal. There is no distension.     Palpations: Abdomen is soft.     Tenderness: There is no abdominal tenderness.  Musculoskeletal:     Cervical back: Normal range of motion and neck supple.     Comments: Muscle loss  Skin:    General: Skin is warm.     Capillary Refill: Capillary refill takes less than 2 seconds.  Neurological:     General: No focal deficit present.     Mental Status: He is alert.     Motor: Weakness present.     Gait: Gait abnormal.     Comments: Parkinson tremor, slow gait, mask like facies       Lab Results  Component Value Date   WBC 5.9 11/23/2020   HGB 13.9 11/23/2020   HCT 41.2 11/23/2020   PLT 172 11/23/2020   GLUCOSE 264 (H) 11/23/2020   CHOL 135 11/23/2020   TRIG 114 11/23/2020   HDL 45 11/23/2020   LDLCALC 69 11/23/2020   ALT 5 11/23/2020   AST 12 11/23/2020   NA 140 11/23/2020   K 5.0 11/23/2020   CL 102 11/23/2020   CREATININE 1.01 11/23/2020   BUN 28 (H) 11/23/2020   CO2 17 (L) 11/23/2020   TSH 2.320 11/23/2020   HGBA1C 8.5 (H) 11/23/2020   MICROALBUR 80 01/17/2020      Assessment & Plan:   Diagnoses and all orders for this visit: Parkinson's disease (HCC) Patient's parkinsons is well controlled.  He says he is eating  well.  Abnormal weight loss -     DG Chest 2 View -     CT Abdomen Pelvis Wo Contrast -     CBC with Differential/Platelet -     Comprehensive metabolic panel -     Hemoglobin A1c -     Lipid panel -     TSH Patient losing weight, check metabolic labs and CXR and CT abdomen  Obesity, diabetes, and hypertension syndrome (HCC) -     Lipid panel An individual care plan for diabetes was established and reinforced today.  The patient's status was assessed using clinical findings on exam, labs and diagnostic testing. Patient success at meeting goals based on disease specific evidence-based guidelines and found to be fair controlled. Medications were assessed and patient's understanding of the medical issues , including barriers were assessed. Recommend adherence to a diabetic diet, a graduated exercise program, HgbA1c level is checked quarterly, and urine microalbumin performed yearly .  Annual mono-filament sensation testing performed. Lower blood pressure and control hyperlipidemia is important. Get annual eye exams and annual flu shots and smoking cessation discussed.  Self management goals were discussed.  Unstable diabetes mellitus (HCC) -     Hemoglobin A1c An individual care plan for diabetes was established and reinforced today.  The patient's status was assessed using clinical findings on exam, labs and diagnostic testing. Patient success at meeting goals based on disease specific evidence-based guidelines and found to be fair controlled. Medications were assessed and patient's understanding of the medical issues , including barriers were assessed. Recommend adherence to a diabetic diet, a graduated exercise program, HgbA1c level is checked quarterly, and urine microalbumin performed yearly .  Annual mono-filament sensation testing performed. Lower blood pressure and control hyperlipidemia is important. Get annual eye exams and annual flu shots and smoking cessation  discussed.  Self  management goals were discussed. Other orders -     Cardiovascular Risk Assessment        Follow-up: Return in about 1 month (around 12/24/2020).  An After Visit Summary was printed and given to the patient.  [name], MD [facility] [phone]

## 2020-11-23 NOTE — Telephone Encounter (Signed)
   Jeffrey Parsons has been scheduled for the following appointment:  WHAT: Abdomen/Pelvis CT WHERE: [facility] DATE: 11/29/20 TIME: 11:00 am  Patient has been made aware of appointment and to pick up prep kit by 6/1 @ 4:30, also no solid foods 2 hrs prior to test.

## 2020-11-24 LAB — COMPREHENSIVE METABOLIC PANEL
ALT: 5 IU/L (ref 0–44)
AST: 12 IU/L (ref 0–40)
Albumin/Globulin Ratio: 2.2 (ref 1.2–2.2)
Albumin: 4.6 g/dL (ref 3.8–4.8)
Alkaline Phosphatase: 91 IU/L (ref 44–121)
BUN/Creatinine Ratio: 28 — ABNORMAL HIGH (ref 10–24)
BUN: 28 mg/dL — ABNORMAL HIGH (ref 8–27)
Bilirubin Total: 1 mg/dL (ref 0.0–1.2)
CO2: 17 mmol/L — ABNORMAL LOW (ref 20–29)
Calcium: 9.2 mg/dL (ref 8.6–10.2)
Chloride: 102 mmol/L (ref 96–106)
Creatinine, Ser: 1.01 mg/dL (ref 0.76–1.27)
Globulin, Total: 2.1 g/dL (ref 1.5–4.5)
Glucose: 264 mg/dL — ABNORMAL HIGH (ref 65–99)
Potassium: 5 mmol/L (ref 3.5–5.2)
Sodium: 140 mmol/L (ref 134–144)
Total Protein: 6.7 g/dL (ref 6.0–8.5)
eGFR: 80 mL/min/{1.73_m2} (ref >59)

## 2020-11-24 LAB — CBC WITH DIFFERENTIAL/PLATELET
Basophils Absolute: 0.1 10*3/uL (ref 0.0–0.2)
Basos: 1 %
EOS (ABSOLUTE): 0.3 10*3/uL (ref 0.0–0.4)
Eos: 5 %
Hematocrit: 41.2 % (ref 37.5–51.0)
Hemoglobin: 13.9 g/dL (ref 13.0–17.7)
Immature Grans (Abs): 0 10*3/uL (ref 0.0–0.1)
Immature Granulocytes: 0 %
Lymphocytes Absolute: 1.2 10*3/uL (ref 0.7–3.1)
Lymphs: 20 %
MCH: 32.3 pg (ref 26.6–33.0)
MCHC: 33.7 g/dL (ref 31.5–35.7)
MCV: 96 fL (ref 79–97)
Monocytes Absolute: 0.3 10*3/uL (ref 0.1–0.9)
Monocytes: 5 %
Neutrophils Absolute: 4.1 10*3/uL (ref 1.4–7.0)
Neutrophils: 69 %
Platelets: 172 10*3/uL (ref 150–450)
RBC: 4.31 x10E6/uL (ref 4.14–5.80)
RDW: 11.2 % — ABNORMAL LOW (ref 11.6–15.4)
WBC: 5.9 10*3/uL (ref 3.4–10.8)

## 2020-11-24 LAB — LIPID PANEL
Chol/HDL Ratio: 3 ratio (ref 0.0–5.0)
Cholesterol, Total: 135 mg/dL (ref 100–199)
HDL: 45 mg/dL (ref >39)
LDL Chol Calc (NIH): 69 mg/dL (ref 0–99)
Triglycerides: 114 mg/dL (ref 0–149)
VLDL Cholesterol Cal: 21 mg/dL (ref 5–40)

## 2020-11-24 LAB — CARDIOVASCULAR RISK ASSESSMENT

## 2020-11-24 LAB — TSH: TSH: 2.32 u[IU]/mL (ref 0.450–4.500)

## 2020-11-24 LAB — HEMOGLOBIN A1C
Est. average glucose Bld gHb Est-mCnc: 197 mg/dL
Hgb A1c MFr Bld: 8.5 % — ABNORMAL HIGH (ref 4.8–5.6)

## 2020-11-25 NOTE — Progress Notes (Signed)
CBC normal, glucose 264, kidney tests OK but may need to hydrate more, liver test OK, A1c 8.5 we need better control,want A1c <8.0,  watch diet, Cholesterol good, TSH 2.32 normal,  lp

## 2020-11-29 DIAGNOSIS — K8020 Calculus of gallbladder without cholecystitis without obstruction: Secondary | ICD-10-CM | POA: Diagnosis not present

## 2020-11-29 DIAGNOSIS — R634 Abnormal weight loss: Secondary | ICD-10-CM | POA: Diagnosis not present

## 2020-12-04 ENCOUNTER — Other Ambulatory Visit: Payer: Self-pay

## 2020-12-04 ENCOUNTER — Ambulatory Visit (INDEPENDENT_AMBULATORY_CARE_PROVIDER_SITE_OTHER): Payer: Medicare Other

## 2020-12-04 DIAGNOSIS — E1169 Type 2 diabetes mellitus with other specified complication: Secondary | ICD-10-CM

## 2020-12-04 NOTE — Progress Notes (Addendum)
Chronic Care Management Pharmacy Note  12/04/2020 Name:  Jeffrey Parsons MRN:  [id number] DOB:  December 24, 1950   Plan Recommendations:  Patient's blood sugar has been fluctuating more since holding [place]. He reports readings in upper 200-300s with Lantus 18-20 units daily. Patient would like to know if Dr. [name] would consider resuming Farxiga 5 mg daily with Lantus 10 units.   Subjective: Jeffrey Parsons is an 70 y.o. year old male who is a primary patient of [name], Zeb Comfort, MD.  The CCM team was consulted for assistance with disease management and care coordination needs.    Engaged with patient by telephone for follow up visit in response to provider referral for pharmacy case management and/or care coordination services.   Consent to Services:  The patient was given information about Chronic Care Management services, agreed to services, and gave verbal consent prior to initiation of services.  Please see initial visit note for detailed documentation.   Patient Care Team: [name], MD as PCP - General (Family Medicine) Nahser, Wonda Cheng, MD as PCP - Cardiology (Cardiology) Deboraha Sprang, MD as PCP - Electrophysiology (Cardiology) Burnice Logan, [facility] (Pharmacist)  Recent office visits: 11/23/2020 - Dr. [name] ordering tests to assess weight loss. Hold [place] for now. Hydrate and watch diet for improved blood sugar management.  [id number] - no changes. Continue Crestor.   Recent consult visits: 09/03/2020 - neuro - refer to PT for PD and balance. Reduce topiramate to 1 tab in the morning. After 2 weeks if no worsening of your tremor, please stop taking altogether. Continue to exercise. Consider lowering bp medications as much as possible. People with PD have low bp and are sensitive to low bp.    Hospital visits:   Objective:  Lab Results  Component Value Date   CREATININE 1.01 11/23/2020   BUN 28 (H) 11/23/2020   GFRNONAA 76 05/03/2020   GFRAA 87  05/03/2020   NA 140 11/23/2020   K 5.0 11/23/2020   CALCIUM 9.2 11/23/2020   CO2 17 (L) 11/23/2020    Lab Results  Component Value Date/Time   HGBA1C 8.5 (H) 11/23/2020 09:30 AM   HGBA1C 7.8 (H) 08/30/2020 08:47 AM   MICROALBUR 80 01/17/2020 08:38 AM    Last diabetic Eye exam: No results found for: HMDIABEYEEXA  Last diabetic Foot exam: No results found for: HMDIABFOOTEX   Lab Results  Component Value Date   CHOL 135 11/23/2020   HDL 45 11/23/2020   LDLCALC 69 11/23/2020   TRIG 114 11/23/2020   CHOLHDL 3.0 11/23/2020    Hepatic Function Latest Ref Rng & Units 11/23/2020 08/30/2020 05/03/2020  Total Protein 6.0 - 8.5 g/dL 6.7 6.3 6.5  Albumin 3.8 - 4.8 g/dL 4.6 4.1 4.4  AST 0 - 40 IU/L 12 14 14   ALT 0 - 44 IU/L 5 4 2   Alk Phosphatase 44 - 121 IU/L 91 106 121  Total Bilirubin 0.0 - 1.2 mg/dL 1.0 0.7 1.0  Bilirubin, Direct 0.00 - 0.40 mg/dL - - 0.23    Lab Results  Component Value Date/Time   TSH 2.320 11/23/2020 09:30 AM   TSH 1.440 05/22/2020 08:49 AM    CBC Latest Ref Rng & Units 11/23/2020 08/30/2020 03/29/2020  WBC 3.4 - 10.8 x10E3/uL 5.9 7.1 7.0  Hemoglobin 13.0 - 17.7 g/dL 13.9 13.2 12.8(L)  Hematocrit 37.5 - 51.0 % 41.2 40.4 38.1(L)  Platelets 150 - 450 x10E3/uL 172 160 176    No results found for: VD25OH  Clinical  ASCVD: Yes  The ASCVD Risk score Mikey Bussing DC Jr., et al., 2013) failed to calculate for the following reasons:   The patient has a prior MI or stroke diagnosis    Depression screen [facility] 2/9 01/17/2020  Decreased Interest 0  Down, Depressed, Hopeless 0  PHQ - 2 Score 0  Altered sleeping 0  Tired, decreased energy 1  Change in appetite 0  Feeling bad or failure about yourself  0  Trouble concentrating 0  Moving slowly or fidgety/restless 1  Suicidal thoughts 0  PHQ-9 Score 2  Difficult doing work/chores Not difficult at all     Other: (CHADS2VASc if Afib, MMRC or CAT for COPD, ACT, DEXA)  Social History   Tobacco Use  Smoking Status Never  Smoker  Smokeless Tobacco Never Used   BP Readings from Last 3 Encounters:  11/23/20 124/64  10/29/20 116/60  08/30/20 (!) 120/58   Pulse Readings from Last 3 Encounters:  11/23/20 93  10/29/20 88  08/30/20 94   Wt Readings from Last 3 Encounters:  11/23/20 142 lb (64.4 kg)  10/29/20 150 lb 3.2 oz (68.1 kg)  08/30/20 151 lb (68.5 kg)    Assessment/Interventions: Review of patient past medical history, allergies, medications, health status, including review of consultants reports, laboratory and other test data, was performed as part of comprehensive evaluation and provision of chronic care management services.   SDOH:  (Social Determinants of Health) assessments and interventions performed: Yes   CCM Care Plan  Allergies  Allergen Reactions   Onion     Upset stomach   Sulfa Antibiotics Other (See Comments)    FLUSH     Medications Reviewed Today     Reviewed by Augustin Coupe, CMA (Certified Medical Assistant) on 11/23/20 at 0907  Med List Status: <None>   Medication Order Taking? Sig Documenting Provider Last Dose Status Informant  acetaminophen (TYLENOL) 500 MG tablet [id number] Yes Take 500 mg by mouth daily as needed for moderate pain or headache.  [provider] Taking Active Multiple Informants  albuterol (VENTOLIN HFA) 108 (90 Base) MCG/ACT inhaler [id number] Yes Inhale 2 puffs into the lungs every 6 (six) hours as needed for wheezing or shortness of breath. [provider] Taking Active Multiple Informants  aspirin EC 81 MG tablet [id number] Yes Take 1 tablet (81 mg total) by mouth daily. Richardson Dopp T, PA-C Taking Active Multiple Informants  carbidopa-levodopa (SINEMET CR) 50-200 MG tablet [id number] Yes Take 1 tablet by mouth every evening.  [provider] Taking Active Multiple Informants  carbidopa-levodopa (SINEMET IR) 25-250 MG tablet [id number] Yes Take 1 tablet by mouth 4 (four) times daily.  [provider] Taking  Active Multiple Informants  carvedilol (COREG) 12.5 MG tablet [id number] Yes Take 1 tablet (12.5 mg total) by mouth 2 (two) times daily. Nahser, Wonda Cheng, MD Taking Active   Continuous Blood Gluc Sensor (FREESTYLE LIBRE 2 SENSOR) [place] [id number] Yes by Does not apply route. [provider] Taking Active Multiple Informants  dapagliflozin propanediol (FARXIGA) 5 MG TABS tablet [id number] Yes Take 1 tablet (5 mg total) by mouth daily before breakfast. [name], MD Taking Active   insulin glargine (LANTUS) 100 UNIT/ML Solostar Pen [id number] Yes Inject 10 Units into the skin daily. [provider] Taking Active            Med Note Owens Shark, [place] B   Fri Nov 16, 2020  1:24 PM) Recommend reducing dose to 10 units daily with addition of Farxiga 5  mg daily   Insulin Pen Needle (PEN NEEDLES 5/16") 30G X 8 MM MISC [id number] Yes by Does not apply route. [provider] Taking Active Multiple Informants  metFORMIN (GLUCOPHAGE) 500 MG tablet [id number] Yes Take 2 tablets (1,000 mg total) by mouth 2 (two) times daily with a meal. Start this with new insulin [name], MD Taking Active   Multiple Vitamin (MULTIVITAMIN) tablet [id number] Yes Take 1 tablet by mouth daily. [provider] Taking Active Multiple Informants  rosuvastatin (CRESTOR) 10 MG tablet [id number] Yes Take 10 mg by mouth daily. [provider] Taking Active   sacubitril-valsartan (ENTRESTO) 49-51 MG [id number] Yes Take 1 tablet by mouth 2 (two) times daily. Nahser, Wonda Cheng, MD Taking Active Multiple Informants            Patient Active Problem List   Diagnosis Date Noted   Abnormal weight loss 11/23/2020   Fatigue 05/22/2020   BPH with obstruction/lower urinary tract symptoms 05/22/2020   BMI 23.0-23.9, adult 01/17/2020   Sick sinus syndrome ([place]) 09/13/2019   Obesity, diabetes, and hypertension syndrome ([place]) 09/13/2019   Secondary dystonia 07/04/2019   Essential  hypertension 06/26/2018   Complete heart block ([place]) 05/26/2017   Cardiac device in situ 05/26/2017   Chronic combined systolic and diastolic CHF (congestive heart failure) ([place]) 05/26/2017   Ischemic cardiomyopathy 05/26/2017   Allergy to sulfa drugs 12/04/2016   S/P CABG x 5 09/12/2016   Mixed hyperlipidemia 09/12/2016   Coronary artery disease involving native coronary artery of native heart without angina pectoris 09/12/2016   Pacemaker 06/06/2016   Long term (current) use of aspirin 12/06/2015   Mild obesity 12/06/2015   Other long term (current) drug therapy 12/06/2015   Mild intermittent asthma with acute exacerbation 12/07/2014   Parkinson's disease ([place]) 12/07/2014    Immunization History  Administered Date(s) Administered   Moderna Sars-Covid-2 Vaccination 08/03/2019, 08/31/2019    Conditions to be addressed/monitored:  Hyperlipidemia and Diabetes  There are no care plans that you recently modified to display for this patient.    Medication Assistance:  Delene Loll and Lantus obtained through [employer] and [employer] medication assistance program.  Enrollment ends 60/67/7034   Application submitted for Farxiga 5 mg daily. Expected start date 11/28/2020.   Patient's preferred pharmacy is:  [id number] [address] [address]Phone: [phone] Fax: [phone] Uses pill box? Yes Pt endorses excellent compliance  We discussed: Benefits of medication synchronization, packaging and delivery as well as enhanced pharmacist oversight with [employer]. Patient decided to: Continue current medication management strategy  Care Plan and Follow Up Patient Decision:  Patient agrees to Care Plan and Follow-up.  Plan: Telephone follow up appointment with care management team member scheduled for:  12/2020

## 2020-12-06 NOTE — Patient Instructions (Signed)
Visit Information   Goals Addressed   None    Patient Care Plan: CCM Pharmacy Care Plan     Problem Identified: CAD, Diabetes, Hypertension   Priority: High  Onset Date: 08/07/2020     Long-Range Goal: Disease Management   Start Date: 08/07/2020  Expected End Date: 08/07/2021  Recent Progress: On track  Priority: High  Note:   Current Barriers:  Unable to achieve control of Diabetes   Pharmacist Clinical Goal(s):  Patient will achieve control of diabetes as evidenced by a1c and blood sugar readings through collaboration with PharmD and provider.   Interventions: 1:1 collaboration with Abigail Miyamoto, MD regarding development and update of comprehensive plan of care as evidenced by provider attestation and co-signature Inter-disciplinary care team collaboration (see longitudinal plan of care) Comprehensive medication review performed; medication list updated in electronic medical record  Hyperlipidemia: (LDL goal < 55) -Not ideally controlled -Current treatment:  rosuvastatin 10 mg daily  -Medications previously tried: atorvastatin  -Current dietary patterns: eating healthy and trying to adhere to diabetic diet -Current exercise habits: [employer] 5 days a week  -Educated on Cholesterol goals;  Benefits of statin for ASCVD risk reduction; Importance of limiting foods high in cholesterol; Exercise goal of 150 minutes per week; -Counseled on diet and exercise extensively Recommend continuing current medication   Diabetes (A1c goal <7%) -Uncontrolled -Current medications: Lantus 10 units into the skin daily - using 18-20 units since Farxiga held  Metformin 1000 mg bid  Freestyle Libre Insulin pen needles  Farxiga 5 mg daily - held currently  -Medications previously tried: 70/30 insulin  -Current home glucose readings fasting glucose: 89-279 post prandial glucose: 137-330 -Reports hypoglycemic/hyperglycemic symptoms -Current meal patterns:  breakfast:  eggs lunch: trying to eat balanced meals dinner: meat and vegetables snacks: ensure/boost, peanut butter crackers, pretzels drinks: juice to correct low blood sugar -Current exercise:  [employer] m-f when wife can drive and pt for shoulder/parkinson's -Educated on A1c and blood sugar goals; Complications of diabetes including kidney damage, retinal damage, and cardiovascular disease; Exercise goal of 150 minutes per week; Prevention and management of hypoglycemic episodes; Continuous glucose monitoring; -Counseled to check feet daily and get yearly eye exams -Counseled on diet and exercise extensively Recommended patient continue to watch blood sugar closely. Recommend Dr. Marina Goodell consider resuming Farxiga 5 mg daily and Lantus 10 units daily and monitor for weight loss.   Patient Goals/Self-Care Activities Patient will:  - take medications as prescribed focus on medication adherence by using pill box check glucose throughout the day with [facility], document, and provide at future appointments target a minimum of 150 minutes of moderate intensity exercise weekly  Follow Up Plan: Telephone follow up appointment with care management team member scheduled for: 12/04/2020      Patient verbalizes understanding of instructions provided today and agrees to view in MyChart.  Telephone follow up appointment with pharmacy team member scheduled for: 12/2020 and once results of recommendation to Dr. Sampson Si, [facility]

## 2020-12-13 DIAGNOSIS — Z8781 Personal history of (healed) traumatic fracture: Secondary | ICD-10-CM | POA: Diagnosis not present

## 2020-12-13 DIAGNOSIS — Z9889 Other specified postprocedural states: Secondary | ICD-10-CM | POA: Diagnosis not present

## 2020-12-13 DIAGNOSIS — S42211A Unspecified displaced fracture of surgical neck of right humerus, initial encounter for closed fracture: Secondary | ICD-10-CM | POA: Diagnosis not present

## 2020-12-13 NOTE — Progress Notes (Signed)
Pharmacist reached out to patient to discuss resuming Farxiga 5 mg daily and reduce dose of Lantus to 10 units per Dr. Lamar Sprinkles approval. Patient reports understanding and indicates he needs a refill of Lantus through [facility] patient assistance. Pharmacist called to coordinate refill.   Patient and pharmacist discussed upcoming visits 1 month follow-up and regular fasting appointment. Pharmacist reached out to Dr. Lamar Sprinkles nurse to confirm that patient can cancel end of June visit and complete July 7th visit only. Message sent to patient to ensure patient aware.   Juliane Lack, PharmD, [facility] Clinical Pharmacist [facility] [phone] (office) [phone] (mobile)

## 2020-12-24 ENCOUNTER — Ambulatory Visit: Payer: Medicare Other | Admitting: Legal Medicine

## 2020-12-25 ENCOUNTER — Telehealth: Payer: Self-pay

## 2020-12-25 NOTE — Telephone Encounter (Signed)
Patient assistance received today for Lantus, medication has been placed in samples refrigerator. Patient has not been notified. 

## 2020-12-25 NOTE — Telephone Encounter (Signed)
Contacted patient.

## 2021-01-01 ENCOUNTER — Ambulatory Visit: Payer: Medicare Other | Admitting: Legal Medicine

## 2021-01-03 ENCOUNTER — Telehealth: Payer: Medicare Other | Admitting: Legal Medicine

## 2021-01-08 ENCOUNTER — Other Ambulatory Visit: Payer: Self-pay

## 2021-01-08 ENCOUNTER — Ambulatory Visit (INDEPENDENT_AMBULATORY_CARE_PROVIDER_SITE_OTHER): Payer: Medicare Other

## 2021-01-08 DIAGNOSIS — E1169 Type 2 diabetes mellitus with other specified complication: Secondary | ICD-10-CM

## 2021-01-09 ENCOUNTER — Ambulatory Visit (INDEPENDENT_AMBULATORY_CARE_PROVIDER_SITE_OTHER): Payer: Medicare Other

## 2021-01-09 DIAGNOSIS — I442 Atrioventricular block, complete: Secondary | ICD-10-CM | POA: Diagnosis not present

## 2021-01-10 ENCOUNTER — Ambulatory Visit (INDEPENDENT_AMBULATORY_CARE_PROVIDER_SITE_OTHER): Payer: Medicare Other | Admitting: Legal Medicine

## 2021-01-10 ENCOUNTER — Encounter: Payer: Self-pay | Admitting: Legal Medicine

## 2021-01-10 ENCOUNTER — Other Ambulatory Visit: Payer: Self-pay

## 2021-01-10 VITALS — BP 100/60 | HR 88 | Temp 97.5°F | Resp 16 | Ht 66.0 in | Wt 137.0 lb

## 2021-01-10 DIAGNOSIS — N138 Other obstructive and reflux uropathy: Secondary | ICD-10-CM

## 2021-01-10 DIAGNOSIS — R634 Abnormal weight loss: Secondary | ICD-10-CM | POA: Diagnosis not present

## 2021-01-10 DIAGNOSIS — N401 Enlarged prostate with lower urinary tract symptoms: Secondary | ICD-10-CM | POA: Diagnosis not present

## 2021-01-10 DIAGNOSIS — I700 Atherosclerosis of aorta: Secondary | ICD-10-CM | POA: Diagnosis not present

## 2021-01-10 LAB — CUP PACEART REMOTE DEVICE CHECK
Battery Remaining Longevity: 66 mo
Battery Voltage: 2.97 V
Brady Statistic AP VP Percent: 0.7 %
Brady Statistic AP VS Percent: 0.01 %
Brady Statistic AS VP Percent: 94.53 %
Brady Statistic AS VS Percent: 4.76 %
Brady Statistic RA Percent Paced: 0.71 %
Brady Statistic RV Percent Paced: 93.96 %
Date Time Interrogation Session: 20220713012502
HighPow Impedance: 53 Ohm
Implantable Lead Implant Date: 20111213
Implantable Lead Implant Date: 20191230
Implantable Lead Implant Date: 20191230
Implantable Lead Location: 753858
Implantable Lead Location: 753859
Implantable Lead Location: 753860
Implantable Lead Model: 5076
Implantable Pulse Generator Implant Date: 20191230
Lead Channel Impedance Value: 223.149
Lead Channel Impedance Value: 223.149
Lead Channel Impedance Value: 243.712
Lead Channel Impedance Value: 249.509
Lead Channel Impedance Value: 249.509
Lead Channel Impedance Value: 342 Ohm
Lead Channel Impedance Value: 399 Ohm
Lead Channel Impedance Value: 437 Ohm
Lead Channel Impedance Value: 456 Ohm
Lead Channel Impedance Value: 456 Ohm
Lead Channel Impedance Value: 456 Ohm
Lead Channel Impedance Value: 551 Ohm
Lead Channel Impedance Value: 722 Ohm
Lead Channel Impedance Value: 760 Ohm
Lead Channel Impedance Value: 779 Ohm
Lead Channel Impedance Value: 893 Ohm
Lead Channel Impedance Value: 893 Ohm
Lead Channel Impedance Value: 931 Ohm
Lead Channel Pacing Threshold Amplitude: 0.5 V
Lead Channel Pacing Threshold Amplitude: 0.875 V
Lead Channel Pacing Threshold Amplitude: 0.875 V
Lead Channel Pacing Threshold Pulse Width: 0.4 ms
Lead Channel Pacing Threshold Pulse Width: 0.4 ms
Lead Channel Pacing Threshold Pulse Width: 0.4 ms
Lead Channel Sensing Intrinsic Amplitude: 14.125 mV
Lead Channel Sensing Intrinsic Amplitude: 14.125 mV
Lead Channel Sensing Intrinsic Amplitude: 2.25 mV
Lead Channel Sensing Intrinsic Amplitude: 2.25 mV
Lead Channel Setting Pacing Amplitude: 1.5 V
Lead Channel Setting Pacing Amplitude: 1.75 V
Lead Channel Setting Pacing Amplitude: 2 V
Lead Channel Setting Pacing Pulse Width: 0.4 ms
Lead Channel Setting Pacing Pulse Width: 0.4 ms
Lead Channel Setting Sensing Sensitivity: 0.45 mV

## 2021-01-10 NOTE — Progress Notes (Signed)
Established Patient Office Visit  Subjective:  Patient ID: Jeffrey Parsons, male    DOB: 03/05/1951  Age: 70 y.o. MRN: [id number]  CC:  Chief Complaint  Patient presents with   Weight Loss    HPI Jeffrey Parsons presents for weight loss,lost 5 lbs since last visit on 11/24/2019.  CT showed enlarged prostate and benign gall stones.    His parkinson's is under control, no dysphagia.  He has polyuria on farxiga.  Past Medical History:  Diagnosis Date   AICD (automatic cardioverter/defibrillator) present    Complete heart block ([place]) 05/26/2017   Coronary artery disease involving native coronary artery of native heart without angina pectoris 09/12/2016   Ischemic cardiomyopathy 05/26/2017   Pneumonia 2019   Retinopathy     Past Surgical History:  Procedure Laterality Date   BI-VENTRICULAR IMPLANTABLE CARDIOVERTER DEFIBRILLATOR UPGRADE  06/25/2018   BIV UPGRADE N/A 06/25/2018   Procedure: BIV ICD UPGRADE;  Surgeon: Deboraha Sprang, MD;  Location: [place] [facility];  Service: Cardiovascular;  Laterality: N/A;   CARDIAC CATHETERIZATION  2001; ~ 2013   CATARACT EXTRACTION W/ INTRAOCULAR LENS  IMPLANT, BILATERAL Bilateral    CORONARY ARTERY BYPASS GRAFT  2001   "CABG X5"   INSERT / REPLACE / REMOVE PACEMAKER  05/2010   KNEE SURGERY Left    AS A CHILD   R shoulder surgery  05/2020   RIGHT/LEFT HEART CATH AND CORONARY/GRAFT ANGIOGRAPHY N/A 06/16/2017   Procedure: RIGHT/LEFT HEART CATH AND CORONARY/GRAFT ANGIOGRAPHY;  Surgeon: Sherren Mocha, MD;  Location: [place] [facility];  Service: Cardiovascular;  Laterality: N/A;   TONSILLECTOMY      Family History  Problem Relation Age of Onset   Bronchitis Mother    Coronary artery disease Father    Heart disease Father    Hypertension Sister    Hypertension Brother     Social History   Socioeconomic History   Marital status: Married    Spouse name: Not on file   Number of children: Not on file   Years of education: Not on  file   Highest education level: Not on file  Occupational History   Not on file  Tobacco Use   Smoking status: Never   Smokeless tobacco: Never  Vaping Use   Vaping Use: Never used  Substance and Sexual Activity   Alcohol use: Never   Drug use: Never   Sexual activity: Yes  Other Topics Concern   Not on file  Social History Narrative   Not on file   Social Determinants of Health   Financial Resource Strain: Not on file  Food Insecurity: No Food Insecurity   Worried About Running Out of Food in the Last Year: Never true   [place] in the Last Year: Never true  Transportation Needs: No Transportation Needs   Lack of Transportation (Medical): No   Lack of Transportation (Non-Medical): No  Physical Activity: Insufficiently Active   Days of Exercise per Week: 3 days   Minutes of Exercise per Session: 30 min  Stress: Not on file  Social Connections: Socially Integrated   Frequency of Communication with Friends and Family: More than three times a week   Frequency of Social Gatherings with Friends and Family: Twice a week   Attends Religious Services: More than 4 times per year   Active Member of [employer] or Organizations: Yes   Attends Music therapist: More than 4 times per year   Marital Status: Married  Intimate  Partner Violence: Not on file    Outpatient Medications Prior to Visit  Medication Sig Dispense Refill   acetaminophen (TYLENOL) 500 MG tablet Take 500 mg by mouth daily as needed for moderate pain or headache.      albuterol (VENTOLIN HFA) 108 (90 Base) MCG/ACT inhaler Inhale 2 puffs into the lungs every 6 (six) hours as needed for wheezing or shortness of breath.     aspirin EC 81 MG tablet Take 1 tablet (81 mg total) by mouth daily.     carbidopa-levodopa (SINEMET CR) 50-200 MG tablet Take 1 tablet by mouth every evening.      carbidopa-levodopa (SINEMET IR) 25-250 MG tablet Take 1 tablet by mouth 4 (four) times daily.      carvedilol (COREG)  12.5 MG tablet Take 1 tablet (12.5 mg total) by mouth 2 (two) times daily. 180 tablet 3   Continuous Blood Gluc Sensor (FREESTYLE LIBRE 2 SENSOR) MISC by Does not apply route.     dapagliflozin propanediol (FARXIGA) 5 MG TABS tablet Take 5 mg by mouth in the morning.     insulin glargine (LANTUS) 100 UNIT/ML Solostar Pen Inject 10 Units into the skin daily.     Insulin Pen Needle (PEN NEEDLES 5/16") 30G X 8 MM MISC by Does not apply route.     metFORMIN (GLUCOPHAGE) 500 MG tablet Take 2 tablets (1,000 mg total) by mouth 2 (two) times daily with a meal. Start this with new insulin 360 tablet 2   Multiple Vitamin (MULTIVITAMIN) tablet Take 1 tablet by mouth daily.     rosuvastatin (CRESTOR) 10 MG tablet Take 10 mg by mouth daily.     sacubitril-valsartan (ENTRESTO) 49-51 MG Take 1 tablet by mouth 2 (two) times daily. 180 tablet 3   No facility-administered medications prior to visit.    Allergies  Allergen Reactions   Onion     Upset stomach   Sulfa Antibiotics Other (See Comments)    FLUSH     ROS Review of Systems  Constitutional:  Positive for unexpected weight change. Negative for chills, fatigue and fever.  HENT:  Negative for congestion, ear pain and sore throat.   Respiratory:  Negative for cough and shortness of breath.   Cardiovascular:  Negative for chest pain.  Gastrointestinal:  Negative for abdominal pain, constipation, diarrhea, nausea and vomiting.  Endocrine: Negative for polydipsia, polyphagia and polyuria.  Genitourinary:  Negative for dysuria and frequency.  Musculoskeletal:  Negative for arthralgias and myalgias.  Neurological:  Negative for dizziness and headaches.  Psychiatric/Behavioral:  Negative for dysphoric mood.        No dysphoria     Objective:    Physical Exam Vitals reviewed.  Constitutional:      Appearance: He is ill-appearing.  HENT:     Right Ear: Tympanic membrane normal.     Left Ear: Tympanic membrane normal.     Nose: Nose normal.      Mouth/Throat:     Mouth: Mucous membranes are moist.  Eyes:     Extraocular Movements: Extraocular movements intact.     Conjunctiva/sclera: Conjunctivae normal.     Pupils: Pupils are equal, round, and reactive to light.  Cardiovascular:     Rate and Rhythm: Normal rate and regular rhythm.     Pulses: Normal pulses.     Heart sounds: No murmur heard.   No gallop.  Pulmonary:     Effort: Pulmonary effort is normal. No respiratory distress.     Breath sounds: Normal  breath sounds. No wheezing.  Abdominal:     General: Abdomen is flat. Bowel sounds are normal. There is no distension.     Palpations: Abdomen is soft.     Tenderness: There is no abdominal tenderness.  Genitourinary:    Comments: Prostate enlarged and firm. Musculoskeletal:        General: Normal range of motion.     Cervical back: Normal range of motion and neck supple.  Skin:    General: Skin is warm.     Capillary Refill: Capillary refill takes less than 2 seconds.  Neurological:     Mental Status: He is alert.    BP 100/60   Pulse 88   Temp (!) 97.5 F (36.4 C)   Resp 16   Ht 5' 6"  (1.676 m)   Wt 137 lb (62.1 kg)   SpO2 98%   BMI 22.11 kg/m  Wt Readings from Last 3 Encounters:  01/10/21 137 lb (62.1 kg)  11/23/20 142 lb (64.4 kg)  10/29/20 150 lb 3.2 oz (68.1 kg)     Health Maintenance Due  Topic Date Due   Hepatitis C Screening  Never done   TETANUS/TDAP  Never done   Zoster Vaccines- Shingrix (1 of 2) Never done   PNA vac Low Risk Adult (1 of 2 - PCV13) Never done   COVID-19 Vaccine (3 - Moderna risk series) 09/28/2019    There are no preventive care reminders to display for this patient.  Lab Results  Component Value Date   TSH 2.320 11/23/2020   Lab Results  Component Value Date   WBC 5.9 11/23/2020   HGB 13.9 11/23/2020   HCT 41.2 11/23/2020   MCV 96 11/23/2020   PLT 172 11/23/2020   Lab Results  Component Value Date   NA 140 11/23/2020   K 5.0 11/23/2020   CO2 17 (L)  11/23/2020   GLUCOSE 264 (H) 11/23/2020   BUN 28 (H) 11/23/2020   CREATININE 1.01 11/23/2020   BILITOT 1.0 11/23/2020   ALKPHOS 91 11/23/2020   AST 12 11/23/2020   ALT 5 11/23/2020   PROT 6.7 11/23/2020   ALBUMIN 4.6 11/23/2020   CALCIUM 9.2 11/23/2020   ANIONGAP 12 03/29/2020   EGFR 80 11/23/2020   Lab Results  Component Value Date   CHOL 135 11/23/2020   Lab Results  Component Value Date   HDL 45 11/23/2020   Lab Results  Component Value Date   LDLCALC 69 11/23/2020   Lab Results  Component Value Date   TRIG 114 11/23/2020   Lab Results  Component Value Date   CHOLHDL 3.0 11/23/2020   Lab Results  Component Value Date   HGBA1C 8.5 (H) 11/23/2020      Assessment & Plan:   Problem List Items Addressed This Visit       Cardiovascular and Mediastinum   Atherosclerosis of aorta ([place]) Aortic atherosclerosis is present on abdominal CT.  He has large prostate     Other   Abnormal weight loss - Primary Continued to lose weight , ct has chronic gall stone and kidney stone and enlarged prostate.  I suspect prostate cancer as cause of weight loss. Refer to urology for workup      Follow-up: Return in about 1 month (around 02/10/2021) for weight loss.    [name], MD

## 2021-01-11 LAB — PSA: Prostate Specific Ag, Serum: 0.9 ng/mL (ref 0.0–4.0)

## 2021-01-11 MED ORDER — DAPAGLIFLOZIN PROPANEDIOL 5 MG PO TABS: 5.0000 mg | ORAL_TABLET | Freq: Every morning | ORAL | 1 refills | Status: DC

## 2021-01-11 NOTE — Progress Notes (Signed)
Chronic Care Management Pharmacy Note  01/11/2021 Name:  Jeffrey Parsons MRN:  [id number] DOB:  11-Dec-1950   Plan Recommendations:  Patient has resumed Farxiga 5 mg. His blood sugar with Farxiga 5 mg and Lantus 10 units range from 110-225. He held [place] for 3 days his blood sugar ranged from 189-250 mg/dL. Patient had a bottle of Farxiga 10 mg from patient assistance and did not realize. He took 10 mg for a few days but had increased urination and decreased blood sugar numbers. He is now taking 5 mg daily again.  Subjective: Jeffrey Parsons is an 70 y.o. year old male who is a primary patient of [name], Zeb Comfort, MD.  The CCM team was consulted for assistance with disease management and care coordination needs.    Engaged with patient by telephone for follow up visit in response to provider referral for pharmacy case management and/or care coordination services.   Consent to Services:  The patient was given information about Chronic Care Management services, agreed to services, and gave verbal consent prior to initiation of services.  Please see initial visit note for detailed documentation.   Patient Care Team: [name], MD as PCP - General (Family Medicine) Nahser, Wonda Cheng, MD as PCP - Cardiology (Cardiology) Deboraha Sprang, MD as PCP - Electrophysiology (Cardiology) Burnice Logan, [facility] (Pharmacist)  Recent office visits: 11/23/2020 - Dr. [name] ordering tests to assess weight loss. Hold [place] for now. Hydrate and watch diet for improved blood sugar management.  [id number] - no changes. Continue Crestor.   Recent consult visits: 09/03/2020 - neuro - refer to PT for PD and balance. Reduce topiramate to 1 tab in the morning. After 2 weeks if no worsening of your tremor, please stop taking altogether. Continue to exercise. Consider lowering bp medications as much as possible. People with PD have low bp and are sensitive to low bp.    Hospital  visits:   Objective:  Lab Results  Component Value Date   CREATININE 1.01 11/23/2020   BUN 28 (H) 11/23/2020   GFRNONAA 76 05/03/2020   GFRAA 87 05/03/2020   NA 140 11/23/2020   K 5.0 11/23/2020   CALCIUM 9.2 11/23/2020   CO2 17 (L) 11/23/2020    Lab Results  Component Value Date/Time   HGBA1C 8.5 (H) 11/23/2020 09:30 AM   HGBA1C 7.8 (H) 08/30/2020 08:47 AM   MICROALBUR 80 01/17/2020 08:38 AM    Last diabetic Eye exam: No results found for: HMDIABEYEEXA  Last diabetic Foot exam: No results found for: HMDIABFOOTEX   Lab Results  Component Value Date   CHOL 135 11/23/2020   HDL 45 11/23/2020   LDLCALC 69 11/23/2020   TRIG 114 11/23/2020   CHOLHDL 3.0 11/23/2020    Hepatic Function Latest Ref Rng & Units 11/23/2020 08/30/2020 05/03/2020  Total Protein 6.0 - 8.5 g/dL 6.7 6.3 6.5  Albumin 3.8 - 4.8 g/dL 4.6 4.1 4.4  AST 0 - 40 IU/L _0 ALT 0 - 44 IU/L _1 Alk Phosphatase 44 - 121 IU/L 91 106 121  Total Bilirubin 0.0 - 1.2 mg/dL 1.0 0.7 1.0  Bilirubin, Direct 0.00 - 0.40 mg/dL - - 0.23    Lab Results  Component Value Date/Time   TSH 2.320 11/23/2020 09:30 AM   TSH 1.440 05/22/2020 08:49 AM    CBC Latest Ref Rng & Units 11/23/2020 08/30/2020 03/29/2020  WBC 3.4 - 10.8 x10E3/uL 5.9 7.1 7.0  Hemoglobin 13.0 - 17.7  g/dL 13.9 13.2 12.8(L)  Hematocrit 37.5 - 51.0 % 41.2 40.4 38.1(L)  Platelets 150 - 450 x10E3/uL 172 160 176    No results found for: VD25OH  Clinical ASCVD: Yes  The ASCVD Risk score Mikey Bussing DC Jr., et al., 2013) failed to calculate for the following reasons:   The patient has a prior MI or stroke diagnosis    Depression screen [facility] 2/9 01/17/2020  Decreased Interest 0  Down, Depressed, Hopeless 0  PHQ - 2 Score 0  Altered sleeping 0  Tired, decreased energy 1  Change in appetite 0  Feeling bad or failure about yourself  0  Trouble concentrating 0  Moving slowly or fidgety/restless 1  Suicidal thoughts 0  PHQ-9 Score 2  Difficult doing  work/chores Not difficult at all     Other: (CHADS2VASc if Afib, MMRC or CAT for COPD, ACT, DEXA)  Social History   Tobacco Use  Smoking Status Never  Smokeless Tobacco Never   BP Readings from Last 3 Encounters:  01/10/21 100/60  11/23/20 124/64  10/29/20 116/60   Pulse Readings from Last 3 Encounters:  01/10/21 88  11/23/20 93  10/29/20 88   Wt Readings from Last 3 Encounters:  01/10/21 137 lb (62.1 kg)  11/23/20 142 lb (64.4 kg)  10/29/20 150 lb 3.2 oz (68.1 kg)    Assessment/Interventions: Review of patient past medical history, allergies, medications, health status, including review of consultants reports, laboratory and other test data, was performed as part of comprehensive evaluation and provision of chronic care management services.   SDOH:  (Social Determinants of Health) assessments and interventions performed: Yes   CCM Care Plan  Allergies  Allergen Reactions   Onion     Upset stomach   Sulfa Antibiotics Other (See Comments)    FLUSH     Medications Reviewed Today     Reviewed by Burnice Logan, [facility] (Pharmacist) on 01/11/21 at 0807  Med List Status: <None>   Medication Order Taking? Sig Documenting Provider Last Dose Status Informant  acetaminophen (TYLENOL) 500 MG tablet [id number] Yes Take 500 mg by mouth daily as needed for moderate pain or headache.  [provider] Taking Active Multiple Informants  albuterol (VENTOLIN HFA) 108 (90 Base) MCG/ACT inhaler [id number] Yes Inhale 2 puffs into the lungs every 6 (six) hours as needed for wheezing or shortness of breath. [provider] Taking Active Multiple Informants  aspirin EC 81 MG tablet [id number] Yes Take 1 tablet (81 mg total) by mouth daily. Richardson Dopp T, PA-C Taking Active Multiple Informants  carbidopa-levodopa (SINEMET CR) 50-200 MG tablet [id number] Yes Take 1 tablet by mouth every evening.  [provider] Taking Active Multiple Informants  carbidopa-levodopa  (SINEMET IR) 25-250 MG tablet [id number] Yes Take 1 tablet by mouth 4 (four) times daily.  [provider] Taking Active Multiple Informants  carvedilol (COREG) 12.5 MG tablet [id number] Yes Take 1 tablet (12.5 mg total) by mouth 2 (two) times daily. Nahser, Wonda Cheng, MD Taking Active   Continuous Blood Gluc Sensor (FREESTYLE LIBRE 2 SENSOR) [place] [id number] Yes by Does not apply route. [provider] Taking Active Multiple Informants  dapagliflozin propanediol (FARXIGA) 5 MG TABS tablet [id number] Yes Take 5 mg by mouth in the morning. [provider] Taking Active   insulin glargine (LANTUS) 100 UNIT/ML Solostar Pen [id number] Yes Inject 10 Units into the skin daily. [provider] Taking Active            Med Note Owens Shark,  SARA B   Thu Dec 13, 2020  4:40 PM) Reduced back to 10 units while resuming farxiga 5 mg daily.   Insulin Pen Needle (PEN NEEDLES 5/16") 30G X 8 MM MISC [id number] Yes by Does not apply route. [provider] Taking Active Multiple Informants  metFORMIN (GLUCOPHAGE) 500 MG tablet [id number] Yes Take 2 tablets (1,000 mg total) by mouth 2 (two) times daily with a meal. Start this with new insulin [name], MD Taking Active   Multiple Vitamin (MULTIVITAMIN) tablet [id number] Yes Take 1 tablet by mouth daily. [provider] Taking Active Multiple Informants  rosuvastatin (CRESTOR) 10 MG tablet [id number] Yes Take 10 mg by mouth daily. [provider] Taking Active   sacubitril-valsartan (ENTRESTO) 49-51 MG [id number] Yes Take 1 tablet by mouth 2 (two) times daily. Nahser, Wonda Cheng, MD Taking Active Multiple Informants            Patient Active Problem List   Diagnosis Date Noted   Atherosclerosis of aorta ([place]) 01/10/2021   Abnormal weight loss 11/23/2020   Fatigue 05/22/2020   BPH with obstruction/lower urinary tract symptoms 05/22/2020   BMI 23.0-23.9, adult 01/17/2020   Sick sinus syndrome ([place])  09/13/2019   Obesity, diabetes, and hypertension syndrome ([place]) 09/13/2019   Secondary dystonia 07/04/2019   Essential hypertension 06/26/2018   Complete heart block ([place]) 05/26/2017   Cardiac device in situ 05/26/2017   Chronic combined systolic and diastolic CHF (congestive heart failure) ([place]) 05/26/2017   Ischemic cardiomyopathy 05/26/2017   Allergy to sulfa drugs 12/04/2016   S/P CABG x 5 09/12/2016   Mixed hyperlipidemia 09/12/2016   Coronary artery disease involving native coronary artery of native heart without angina pectoris 09/12/2016   Pacemaker 06/06/2016   Long term (current) use of aspirin 12/06/2015   Mild obesity 12/06/2015   Other long term (current) drug therapy 12/06/2015   Mild intermittent asthma with acute exacerbation 12/07/2014   Parkinson's disease ([place]) 12/07/2014    Immunization History  Administered Date(s) Administered   Moderna Sars-Covid-2 Vaccination 08/03/2019, 08/31/2019    Conditions to be addressed/monitored:  Hyperlipidemia and Diabetes  Care Plan : [place]  Updates made by Burnice Logan, [place] since 01/11/2021 12:00 AM     Problem: CAD, Diabetes, Hypertension   Priority: High  Onset Date: 08/07/2020     Long-Range Goal: Disease Management   Start Date: 08/07/2020  Expected End Date: 08/07/2021  Recent Progress: On track  Priority: High  Note:   Current Barriers:  Unable to achieve control of Diabetes   Pharmacist Clinical Goal(s):  Patient will achieve control of diabetes as evidenced by a1c and blood sugar readings through collaboration with PharmD and provider.   Interventions: 1:1 collaboration with [name], MD regarding development and update of comprehensive plan of care as evidenced by provider attestation and co-signature Inter-disciplinary care team collaboration (see longitudinal plan of care) Comprehensive medication review performed; medication list updated in electronic medical  record  Hyperlipidemia: (LDL goal < 55) -Not ideally controlled -Current treatment:  rosuvastatin 10 mg daily  -Medications previously tried: atorvastatin  -Current dietary patterns: eating healthy and trying to adhere to diabetic diet -Current exercise habits: [employer] 5 days a week  -Educated on Cholesterol goals;  Benefits of statin for ASCVD risk reduction; Importance of limiting foods high in cholesterol; Exercise goal of 150 minutes per week; -Counseled on diet and exercise extensively Recommend continuing current medication   Diabetes (A1c goal <7%) -Uncontrolled -Current medications:  Lantus 10 units into the skin daily - using 18-20 units since Farxiga held  Metformin 1000 mg bid  Freestyle Libre Insulin pen needles  Farxiga 5 mg daily - held currently  -Medications previously tried: 70/30 insulin  -Current home glucose readings fasting glucose: 61 (with Farxiga 10 mg) -119 mg/dL post prandial glucose: 140-225 mg/dL -Reports hypoglycemic/hyperglycemic symptoms -Current meal patterns:  breakfast: eggs lunch: trying to eat balanced meals dinner: meat and vegetables snacks: ensure/boost, peanut butter crackers, pretzels drinks: juice to correct low blood sugar -Current exercise:  [employer] m-f when wife can drive and pt for shoulder/parkinson's -Educated on A1c and blood sugar goals; Complications of diabetes including kidney damage, retinal damage, and cardiovascular disease; Exercise goal of 150 minutes per week; Prevention and management of hypoglycemic episodes; Continuous glucose monitoring; -Counseled to check feet daily and get yearly eye exams -Counseled on diet and exercise extensively Recommended patient continue to watch blood sugar closely. Patient to resume Farxiga 5 mg daily with Lantus 10 mg. Patient will call pharmacist with any concerns or questions. Pharmacist sending order for Farxiga 5 mg to [facility].   Patient Goals/Self-Care  Activities Patient will:  - take medications as prescribed focus on medication adherence by using pill box check glucose throughout the day with [facility], document, and provide at future appointments target a minimum of 150 minutes of moderate intensity exercise weekly  Follow Up Plan: Telephone follow up appointment with care management team member scheduled for: 02/2021       Medication Assistance:  Delene Loll and Lantus obtained through [employer] and [employer] medication assistance program.  Enrollment ends 62/69/4854   Application submitted for Farxiga 5 mg daily. Expected start date 11/28/2020.   Patient's [facility]  [id number] [address] [id number]: [phone] Fax: [phone] Uses pill box? Yes Pt endorses excellent compliance  We discussed: Benefits of medication synchronization, packaging and delivery as well as enhanced pharmacist oversight with [employer]. Patient decided to: Continue current medication management strategy  Care Plan and Follow Up Patient Decision:  Patient agrees to Care Plan and Follow-up.  Plan: Telephone follow up appointment with care management team member scheduled for:  01/2021

## 2021-01-11 NOTE — Progress Notes (Signed)
PSA 0.9 normal,  lp

## 2021-01-11 NOTE — Patient Instructions (Signed)
Visit Information   Goals Addressed   None    Patient Care Plan: CCM Pharmacy Care Plan     Problem Identified: CAD, Diabetes, Hypertension   Priority: High  Onset Date: 08/07/2020     Long-Range Goal: Disease Management   Start Date: 08/07/2020  Expected End Date: 08/07/2021  Recent Progress: On track  Priority: High  Note:   Current Barriers:  Unable to achieve control of Diabetes   Pharmacist Clinical Goal(s):  Patient will achieve control of diabetes as evidenced by a1c and blood sugar readings through collaboration with PharmD and provider.   Interventions: 1:1 collaboration with Abigail Miyamoto, MD regarding development and update of comprehensive plan of care as evidenced by provider attestation and co-signature Inter-disciplinary care team collaboration (see longitudinal plan of care) Comprehensive medication review performed; medication list updated in electronic medical record  Hyperlipidemia: (LDL goal < 55) -Not ideally controlled -Current treatment:  rosuvastatin 10 mg daily  -Medications previously tried: atorvastatin  -Current dietary patterns: eating healthy and trying to adhere to diabetic diet -Current exercise habits: [employer] 5 days a week  -Educated on Cholesterol goals;  Benefits of statin for ASCVD risk reduction; Importance of limiting foods high in cholesterol; Exercise goal of 150 minutes per week; -Counseled on diet and exercise extensively Recommend continuing current medication   Diabetes (A1c goal <7%) -Uncontrolled -Current medications: Lantus 10 units into the skin daily - using 18-20 units since Farxiga held  Metformin 1000 mg bid  Freestyle Libre Insulin pen needles  Farxiga 5 mg daily - held currently  -Medications previously tried: 70/30 insulin  -Current home glucose readings fasting glucose: 61 (with Farxiga 10 mg) -119 mg/dL post prandial glucose: 140-225 mg/dL -Reports hypoglycemic/hyperglycemic symptoms -Current  meal patterns:  breakfast: eggs lunch: trying to eat balanced meals dinner: meat and vegetables snacks: ensure/boost, peanut butter crackers, pretzels drinks: juice to correct low blood sugar -Current exercise:  [employer] m-f when wife can drive and pt for shoulder/parkinson's -Educated on A1c and blood sugar goals; Complications of diabetes including kidney damage, retinal damage, and cardiovascular disease; Exercise goal of 150 minutes per week; Prevention and management of hypoglycemic episodes; Continuous glucose monitoring; -Counseled to check feet daily and get yearly eye exams -Counseled on diet and exercise extensively Recommended patient continue to watch blood sugar closely. Patient to resume Farxiga 5 mg daily with Lantus 10 mg. Patient will call pharmacist with any concerns or questions. Pharmacist sending order for Farxiga 5 mg to [facility].   Patient Goals/Self-Care Activities Patient will:  - take medications as prescribed focus on medication adherence by using pill box check glucose throughout the day with [facility], document, and provide at future appointments target a minimum of 150 minutes of moderate intensity exercise weekly  Follow Up Plan: Telephone follow up appointment with care management team member scheduled for: 02/2021      Patient verbalizes understanding of instructions provided today and agrees to view in MyChart.  Telephone follow up appointment with pharmacy team member scheduled for:  Earvin Hansen, [facility]

## 2021-01-17 ENCOUNTER — Telehealth: Payer: Self-pay | Admitting: Cardiology

## 2021-01-17 NOTE — Telephone Encounter (Signed)
New message:     The patient is calling requesting to see Dr. Dulce Sellar.

## 2021-02-01 ENCOUNTER — Other Ambulatory Visit: Payer: Self-pay | Admitting: Legal Medicine

## 2021-02-01 DIAGNOSIS — E1169 Type 2 diabetes mellitus with other specified complication: Secondary | ICD-10-CM

## 2021-02-01 DIAGNOSIS — E669 Obesity, unspecified: Secondary | ICD-10-CM

## 2021-02-01 NOTE — Progress Notes (Signed)
Remote ICD transmission.   

## 2021-02-07 ENCOUNTER — Ambulatory Visit (INDEPENDENT_AMBULATORY_CARE_PROVIDER_SITE_OTHER): Payer: Medicare Other

## 2021-02-07 DIAGNOSIS — E1169 Type 2 diabetes mellitus with other specified complication: Secondary | ICD-10-CM

## 2021-02-07 NOTE — Progress Notes (Signed)
Chronic Care Management Pharmacy Note  02/27/2021 Name:  Jeffrey Parsons MRN:  [id number] DOB:  [birth year]   Plan Summary/Recommendations:   Stopped farxiga due to frequent urinations and harder stools. Stopped Wilder Glade 01/17/2021. Blood sugar numbers increased to 200s 07/24. He has increase to insulin back up to 18 units daily. Continue to monitor blood sugar and work to include bedtime snack to avoid overnight hypoglycemia.  07/26: 152 07/29: 128 am 79 lunch 112 bedtime 07/30/: 106 am  07/31: 89 am under 100 the rest of the day 08/01: 64 am 140 at lunch and under 100 the rest of the day 08/05: 108 am suppertime 58 mg/dL 08/11: 111 am 285 mg/dL bedtime  Left lower abdomen pain for 1 week. Comes and goes. Typically present in the morning and goes away after breakfast and morning medications. He thought it was gas and has been treating with anti-gas medications without resolution. Has had an ulcer in high school.  Recommended seeing Dr. [name] for this.  Improved appetite. Increased weight since stopping [place].     Subjective: Jeffrey Parsons is an 70 y.o. year old male who is a primary patient of [name], Zeb Comfort, MD.  The CCM team was consulted for assistance with disease management and care coordination needs.    Engaged with patient by telephone for follow up visit in response to provider referral for pharmacy case management and/or care coordination services.   Consent to Services:  The patient was given information about Chronic Care Management services, agreed to services, and gave verbal consent prior to initiation of services.  Please see initial visit note for detailed documentation.   Patient Care Team: [name], MD as PCP - General (Family Medicine) Nahser, Wonda Cheng, MD as PCP - Cardiology (Cardiology) Deboraha Sprang, MD as PCP - Electrophysiology (Cardiology) Burnice Logan, [facility] (Pharmacist)  Recent office visits: 01/10/2021 - refer to urology  for workup on enlarged prostate.   11/23/2020 - Dr. [name] ordering tests to assess weight loss. Hold [place] for now. Hydrate and watch diet for improved blood sugar management.  [id number] - no changes. Continue Crestor.   Recent consult visits: 09/03/2020 - neuro - refer to PT for PD and balance. Reduce topiramate to 1 tab in the morning. After 2 weeks if no worsening of your tremor, please stop taking altogether. Continue to exercise. Consider lowering bp medications as much as possible. People with PD have low bp and are sensitive to low bp.    Hospital visits:   Objective:  Lab Results  Component Value Date   CREATININE 0.76 02/14/2021   BUN 20 02/14/2021   GFRNONAA 76 05/03/2020   GFRAA 87 05/03/2020   NA 136 02/14/2021   K 4.9 02/14/2021   CALCIUM 8.9 02/14/2021   CO2 22 02/14/2021    Lab Results  Component Value Date/Time   HGBA1C 8.5 (H) 11/23/2020 09:30 AM   HGBA1C 7.8 (H) 08/30/2020 08:47 AM   MICROALBUR 80 01/17/2020 08:38 AM    Last diabetic Eye exam: No results found for: HMDIABEYEEXA  Last diabetic Foot exam: No results found for: HMDIABFOOTEX   Lab Results  Component Value Date   CHOL 135 11/23/2020   HDL 45 11/23/2020   LDLCALC 69 11/23/2020   TRIG 114 11/23/2020   CHOLHDL 3.0 11/23/2020    Hepatic Function Latest Ref Rng & Units 11/23/2020 08/30/2020 05/03/2020  Total Protein 6.0 - 8.5 g/dL 6.7 6.3 6.5  Albumin 3.8 - 4.8 g/dL 4.6 4.1 4.4  AST 0 - 40 IU/L _0 ALT 0 - 44 IU/L _1 Alk Phosphatase 44 - 121 IU/L 91 106 121  Total Bilirubin 0.0 - 1.2 mg/dL 1.0 0.7 1.0  Bilirubin, Direct 0.00 - 0.40 mg/dL - - 0.23    Lab Results  Component Value Date/Time   TSH 2.320 11/23/2020 09:30 AM   TSH 1.440 05/22/2020 08:49 AM    CBC Latest Ref Rng & Units 11/23/2020 08/30/2020 03/29/2020  WBC 3.4 - 10.8 x10E3/uL 5.9 7.1 7.0  Hemoglobin 13.0 - 17.7 g/dL 13.9 13.2 12.8(L)  Hematocrit 37.5 - 51.0 % 41.2 40.4 38.1(L)  Platelets 150 - 450 x10E3/uL 172 160  176    No results found for: VD25OH  Clinical ASCVD: Yes  The ASCVD Risk score Mikey Bussing DC Jr., et al., 2013) failed to calculate for the following reasons:   The patient has a prior MI or stroke diagnosis    Depression screen [facility] 2/9 01/17/2020  Decreased Interest 0  Down, Depressed, Hopeless 0  PHQ - 2 Score 0  Altered sleeping 0  Tired, decreased energy 1  Change in appetite 0  Feeling bad or failure about yourself  0  Trouble concentrating 0  Moving slowly or fidgety/restless 1  Suicidal thoughts 0  PHQ-9 Score 2  Difficult doing work/chores Not difficult at all     Other: (CHADS2VASc if Afib, MMRC or CAT for COPD, ACT, DEXA)  Social History   Tobacco Use  Smoking Status Never  Smokeless Tobacco Never   BP Readings from Last 3 Encounters:  02/14/21 110/60  01/10/21 100/60  11/23/20 124/64   Pulse Readings from Last 3 Encounters:  02/14/21 92  01/10/21 88  11/23/20 93   Wt Readings from Last 3 Encounters:  02/14/21 147 lb (66.7 kg)  01/10/21 137 lb (62.1 kg)  11/23/20 142 lb (64.4 kg)    Assessment/Interventions: Review of patient past medical history, allergies, medications, health status, including review of consultants reports, laboratory and other test data, was performed as part of comprehensive evaluation and provision of chronic care management services.   SDOH:  (Social Determinants of Health) assessments and interventions performed: Yes   CCM Care Plan  Allergies  Allergen Reactions   Onion     Upset stomach   Sulfa Antibiotics Other (See Comments)    FLUSH     Medications Reviewed Today     Reviewed by Burnice Logan, [facility] (Pharmacist) on 02/27/21 at 0950  Med List Status: <None>   Medication Order Taking? Sig Documenting Provider Last Dose Status Informant  acetaminophen (TYLENOL) 500 MG tablet [id number] Yes Take 500 mg by mouth daily as needed for moderate pain or headache.  [provider] Taking Active Multiple Informants   albuterol (VENTOLIN HFA) 108 (90 Base) MCG/ACT inhaler [id number] Yes Inhale 2 puffs into the lungs every 6 (six) hours as needed for wheezing or shortness of breath. [provider] Taking Active Multiple Informants  aspirin EC 81 MG tablet [id number] Yes Take 1 tablet (81 mg total) by mouth daily. Richardson Dopp T, PA-C Taking Active Multiple Informants  carbidopa-levodopa (SINEMET CR) 50-200 MG tablet [id number] Yes Take 1 tablet by mouth every evening.  [provider] Taking Active Multiple Informants  carbidopa-levodopa (SINEMET IR) 25-250 MG tablet [id number] Yes Take 1 tablet by mouth 4 (four) times daily.  [provider] Taking Active Multiple Informants  carvedilol (COREG) 12.5 MG tablet [id number] Yes Take 1 tablet (12.5 mg total) by mouth 2 (  two) times daily. Nahser, Wonda Cheng, MD Taking Active   Continuous Blood Gluc Sensor (FREESTYLE LIBRE 2 SENSOR) [place] [id number] Yes by Does not apply route. [provider] Taking Active Multiple Informants  insulin glargine (LANTUS) 100 UNIT/ML Solostar Pen [id number] Yes Inject 18 Units into the skin daily. [provider] Taking Active            Med Note Owens Shark, [place] B   Thu Dec 13, 2020  4:40 PM) Reduced back to 10 units while resuming farxiga 5 mg daily.   Insulin Pen Needle (PEN NEEDLES 5/16") 30G X 8 MM MISC [id number] Yes by Does not apply route. [provider] Taking Active Multiple Informants  metFORMIN (GLUCOPHAGE) 500 MG tablet [id number] Yes TAKE 2 TABLETS (1,000 MG TOTAL) 2 (TWO) TIMES DAILY WITH A MEAL. START THIS WITH NEW INSULIN [name], MD Taking Active   Multiple Vitamin (MULTIVITAMIN) tablet [id number] Yes Take 1 tablet by mouth daily. [provider] Taking Active Multiple Informants  rosuvastatin (CRESTOR) 10 MG tablet [id number] Yes Take 10 mg by mouth daily. [provider] Taking Active   sacubitril-valsartan (ENTRESTO) 49-51 MG [id number] Yes Take 1  tablet by mouth 2 (two) times daily. Nahser, Wonda Cheng, MD Taking Active Multiple Informants            Patient Active Problem List   Diagnosis Date Noted   Atherosclerosis of aorta ([place]) 01/10/2021   Abnormal weight loss 11/23/2020   Fatigue 05/22/2020   BPH with obstruction/lower urinary tract symptoms 05/22/2020   BMI 23.0-23.9, adult 01/17/2020   Sick sinus syndrome ([place]) 09/13/2019   Obesity, diabetes, and hypertension syndrome ([place]) 09/13/2019   Secondary dystonia 07/04/2019   Essential hypertension 06/26/2018   Complete heart block ([place]) 05/26/2017   Cardiac device in situ 05/26/2017   Chronic combined systolic and diastolic CHF (congestive heart failure) ([place]) 05/26/2017   Ischemic cardiomyopathy 05/26/2017   Allergy to sulfa drugs 12/04/2016   S/P CABG x 5 09/12/2016   Mixed hyperlipidemia 09/12/2016   Coronary artery disease involving native coronary artery of native heart without angina pectoris 09/12/2016   Pacemaker 06/06/2016   Long term (current) use of aspirin 12/06/2015   Mild obesity 12/06/2015   Other long term (current) drug therapy 12/06/2015   Mild intermittent asthma with acute exacerbation 12/07/2014   Parkinson's disease ([place]) 12/07/2014    Immunization History  Administered Date(s) Administered   Moderna Sars-Covid-2 Vaccination 08/03/2019, 08/31/2019    Conditions to be addressed/monitored:  Hyperlipidemia and Diabetes  Care Plan : [place]  Updates made by Burnice Logan, [place] since 02/27/2021 12:00 AM     Problem: CAD, Diabetes, Hypertension   Priority: High  Onset Date: 08/07/2020     Long-Range Goal: Disease Management   Start Date: 08/07/2020  Expected End Date: 08/07/2021  Recent Progress: On track  Priority: High  Note:   Current Barriers:  Unable to achieve control of Diabetes   Pharmacist Clinical Goal(s):  Patient will achieve control of diabetes as evidenced by a1c and blood sugar readings through collaboration  with PharmD and provider.   Interventions: 1:1 collaboration with [name], MD regarding development and update of comprehensive plan of care as evidenced by provider attestation and co-signature Inter-disciplinary care team collaboration (see longitudinal plan of care) Comprehensive medication review performed; medication list updated in electronic medical record  Hyperlipidemia: (LDL goal < 55) -Not ideally controlled -Current treatment:  rosuvastatin 10 mg daily  -Medications previously tried: atorvastatin  -  Current dietary patterns: eating healthy and trying to adhere to diabetic diet -Current exercise habits: [employer] 5 days a week  -Educated on Cholesterol goals;  Benefits of statin for ASCVD risk reduction; Importance of limiting foods high in cholesterol; Exercise goal of 150 minutes per week; -Counseled on diet and exercise extensively Recommend continuing current medication   Diabetes (A1c goal <7.5%) -Uncontrolled -Current medications: Lantus 18 units daily  Metformin 1000 mg bid  Freestyle Libre Insulin pen needles  -Medications previously tried: 70/30 insulin, Farxiga -Current home glucose readings fasting glucose: 64-152 mg/dL post prandial glucose: 140-225 mg/dL -Reports hypoglycemic/hyperglycemic symptoms -Current meal patterns:  breakfast: eggs lunch: trying to eat balanced meals dinner: meat and vegetables snacks: ensure/boost, peanut butter crackers, pretzels drinks: juice to correct low blood sugar -Current exercise:  [employer] m-f when wife can drive and pt for shoulder/parkinson's -Educated on A1c and blood sugar goals; Complications of diabetes including kidney damage, retinal damage, and cardiovascular disease; Exercise goal of 150 minutes per week; Prevention and management of hypoglycemic episodes; Continuous glucose monitoring; -Counseled to check feet daily and get yearly eye exams -Counseled on diet and exercise  extensively Recommended patient continue to watch blood sugar closely. Patient to resume Farxiga 5 mg daily with Lantus 10 mg. Patient will call pharmacist with any concerns or questions. Pharmacist sending order for Farxiga 5 mg to [facility].   Patient Goals/Self-Care Activities Patient will:  - take medications as prescribed focus on medication adherence by using pill box check glucose throughout the day with [facility], document, and provide at future appointments target a minimum of 150 minutes of moderate intensity exercise weekly  Follow Up Plan: Telephone follow up appointment with care management team member scheduled for: 02/2021        Medication Assistance:  Delene Loll and Lantus obtained through [employer] and [employer] medication assistance program.  Enrollment ends 41/58/3094   Application submitted for Farxiga 5 mg daily. Expected start date 11/28/2020.   Patient's preferred pharmacy is:  [id number] [place] [address] [address]Phone: [phone] Fax: [address] [address] [place]Phone: [phone]: [phone] Uses pill box? Yes Pt endorses excellent compliance  We discussed: Benefits of medication synchronization, packaging and delivery as well as enhanced pharmacist oversight with [employer]. Patient decided to: Continue current medication management strategy  Care Plan and Follow Up Patient Decision:  Patient agrees to Care Plan and Follow-up.  Plan: Telephone follow up appointment with care management team member scheduled for:  02/2021

## 2021-02-13 DIAGNOSIS — E113293 Type 2 diabetes mellitus with mild nonproliferative diabetic retinopathy without macular edema, bilateral: Secondary | ICD-10-CM | POA: Diagnosis not present

## 2021-02-13 LAB — HM DIABETES EYE EXAM

## 2021-02-14 ENCOUNTER — Encounter: Payer: Self-pay | Admitting: Legal Medicine

## 2021-02-14 ENCOUNTER — Ambulatory Visit (INDEPENDENT_AMBULATORY_CARE_PROVIDER_SITE_OTHER): Payer: Medicare Other | Admitting: Legal Medicine

## 2021-02-14 ENCOUNTER — Other Ambulatory Visit: Payer: Self-pay

## 2021-02-14 VITALS — BP 110/60 | HR 92 | Temp 97.7°F | Ht 66.0 in | Wt 147.0 lb

## 2021-02-14 DIAGNOSIS — E1159 Type 2 diabetes mellitus with other circulatory complications: Secondary | ICD-10-CM | POA: Diagnosis not present

## 2021-02-14 DIAGNOSIS — E669 Obesity, unspecified: Secondary | ICD-10-CM

## 2021-02-14 DIAGNOSIS — R634 Abnormal weight loss: Secondary | ICD-10-CM | POA: Diagnosis not present

## 2021-02-14 DIAGNOSIS — I152 Hypertension secondary to endocrine disorders: Secondary | ICD-10-CM | POA: Diagnosis not present

## 2021-02-14 DIAGNOSIS — E1169 Type 2 diabetes mellitus with other specified complication: Secondary | ICD-10-CM | POA: Diagnosis not present

## 2021-02-14 NOTE — Progress Notes (Signed)
Established Patient Office Visit  Subjective:  Patient ID: Jeffrey Parsons, male    DOB: 1950-08-29  Age: 70 y.o. MRN: [id number]  CC:  Chief Complaint  Patient presents with   Weight Check   Diabetes    HPI Jeffrey Parsons presents for WEIGHT LOSS, HE LOST 10 LBS ON FARXIGA, HE STOPPED AND HAS GAINED BACK 10 lbs and feels better, glucose has risen.  Past Medical History:  Diagnosis Date   AICD (automatic cardioverter/defibrillator) present    Complete heart block ([place]) 05/26/2017   Coronary artery disease involving native coronary artery of native heart without angina pectoris 09/12/2016   Ischemic cardiomyopathy 05/26/2017   Pneumonia 2019   Retinopathy     Past Surgical History:  Procedure Laterality Date   BI-VENTRICULAR IMPLANTABLE CARDIOVERTER DEFIBRILLATOR UPGRADE  06/25/2018   BIV UPGRADE N/A 06/25/2018   Procedure: BIV ICD UPGRADE;  Surgeon: Deboraha Sprang, MD;  Location: [facility];  Service: Cardiovascular;  Laterality: N/A;   CARDIAC CATHETERIZATION  2001; ~ 2013   CATARACT EXTRACTION W/ INTRAOCULAR LENS  IMPLANT, BILATERAL Bilateral    CORONARY ARTERY BYPASS GRAFT  2001   "CABG X5"   INSERT / REPLACE / REMOVE PACEMAKER  05/2010   KNEE SURGERY Left    AS A CHILD   R shoulder surgery  05/2020   RIGHT/LEFT HEART CATH AND CORONARY/GRAFT ANGIOGRAPHY N/A 06/16/2017   Procedure: RIGHT/LEFT HEART CATH AND CORONARY/GRAFT ANGIOGRAPHY;  Surgeon: Sherren Mocha, MD;  Location: [facility];  Service: Cardiovascular;  Laterality: N/A;   TONSILLECTOMY      Family History  Problem Relation Age of Onset   Bronchitis Mother    Coronary artery disease Father    Heart disease Father    Hypertension Sister    Hypertension Brother     Social History   Socioeconomic History   Marital status: Married    Spouse name: Not on file   Number of children: Not on file   Years of education: Not on file   Highest education level: Not on file  Occupational  History   Not on file  Tobacco Use   Smoking status: Never   Smokeless tobacco: Never  Vaping Use   Vaping Use: Never used  Substance and Sexual Activity   Alcohol use: Never   Drug use: Never   Sexual activity: Yes  Other Topics Concern   Not on file  Social History Narrative   Not on file   Social Determinants of Health   Financial Resource Strain: Not on file  Food Insecurity: Not on file  Transportation Needs: Not on file  Physical Activity: Not on file  Stress: Not on file  Social Connections: Socially Integrated   Frequency of Communication with Friends and Family: More than three times a week   Frequency of Social Gatherings with Friends and Family: Twice a week   Attends Religious Services: More than 4 times per year   Active Member of [employer] or Organizations: Yes   Attends Music therapist: More than 4 times per year   Marital Status: Married  Human resources officer Violence: Not on file    Outpatient Medications Prior to Visit  Medication Sig Dispense Refill   acetaminophen (TYLENOL) 500 MG tablet Take 500 mg by mouth daily as needed for moderate pain or headache.      albuterol (VENTOLIN HFA) 108 (90 Base) MCG/ACT inhaler Inhale 2 puffs into the lungs every 6 (six) hours as needed for wheezing or  shortness of breath.     aspirin EC 81 MG tablet Take 1 tablet (81 mg total) by mouth daily.     carbidopa-levodopa (SINEMET CR) 50-200 MG tablet Take 1 tablet by mouth every evening.      carbidopa-levodopa (SINEMET IR) 25-250 MG tablet Take 1 tablet by mouth 4 (four) times daily.      carvedilol (COREG) 12.5 MG tablet Take 1 tablet (12.5 mg total) by mouth 2 (two) times daily. 180 tablet 3   Continuous Blood Gluc Sensor (FREESTYLE LIBRE 2 SENSOR) MISC by Does not apply route.     insulin glargine (LANTUS) 100 UNIT/ML Solostar Pen Inject 18 Units into the skin daily.     Insulin Pen Needle (PEN NEEDLES 5/16") 30G X 8 MM MISC by Does not apply route.     metFORMIN  (GLUCOPHAGE) 500 MG tablet TAKE 2 TABLETS (1,000 MG TOTAL) 2 (TWO) TIMES DAILY WITH A MEAL. START THIS WITH NEW INSULIN 360 tablet 2   Multiple Vitamin (MULTIVITAMIN) tablet Take 1 tablet by mouth daily.     rosuvastatin (CRESTOR) 10 MG tablet Take 10 mg by mouth daily.     sacubitril-valsartan (ENTRESTO) 49-51 MG Take 1 tablet by mouth 2 (two) times daily. 180 tablet 3   dapagliflozin propanediol (FARXIGA) 5 MG TABS tablet Take 1 tablet (5 mg total) by mouth in the morning. (Patient not taking: Reported on 02/14/2021) 90 tablet 1   No facility-administered medications prior to visit.    Allergies  Allergen Reactions   Onion     Upset stomach   Sulfa Antibiotics Other (See Comments)    FLUSH     ROS Review of Systems  Constitutional:  Negative for chills, fatigue and fever.  HENT:  Negative for congestion, ear pain and sore throat.   Respiratory:  Negative for cough and shortness of breath.   Cardiovascular:  Negative for chest pain.  Gastrointestinal:  Negative for abdominal pain, constipation, diarrhea, nausea and vomiting.  Endocrine: Negative for polydipsia, polyphagia and polyuria.  Genitourinary:  Negative for dysuria and frequency.  Musculoskeletal:  Negative for arthralgias and myalgias.  Neurological:  Negative for dizziness and headaches.  Psychiatric/Behavioral:  Negative for dysphoric mood.        No dysphoria     Objective:    Physical Exam Vitals reviewed.  Constitutional:      Appearance: Normal appearance.  HENT:     Head: Normocephalic.     Right Ear: Tympanic membrane, ear canal and external ear normal.     Left Ear: Tympanic membrane, ear canal and external ear normal.     Mouth/Throat:     Mouth: Mucous membranes are moist.     Pharynx: Oropharynx is clear.  Eyes:     Conjunctiva/sclera: Conjunctivae normal.     Pupils: Pupils are equal, round, and reactive to light.  Cardiovascular:     Rate and Rhythm: Normal rate and regular rhythm.     Pulses:  Normal pulses.     Heart sounds: Normal heart sounds. No murmur heard.   No gallop.  Pulmonary:     Effort: Pulmonary effort is normal. No respiratory distress.     Breath sounds: Normal breath sounds. No wheezing.  Abdominal:     General: Abdomen is flat. Bowel sounds are normal. There is no distension.     Tenderness: There is no abdominal tenderness.  Skin:    General: Skin is warm.     Capillary Refill: Capillary refill takes less than 2  seconds.  Neurological:     General: No focal deficit present.     Mental Status: He is alert and oriented to person, place, and time. Mental status is at baseline.    BP 110/60   Pulse 92   Temp 97.7 F (36.5 C)   Ht 5' 6"  (1.676 m)   Wt 147 lb (66.7 kg)   SpO2 97%   BMI 23.73 kg/m  Wt Readings from Last 3 Encounters:  02/14/21 147 lb (66.7 kg)  01/10/21 137 lb (62.1 kg)  11/23/20 142 lb (64.4 kg)     Health Maintenance Due  Topic Date Due   Hepatitis C Screening  Never done   TETANUS/TDAP  Never done   Zoster Vaccines- Shingrix (1 of 2) Never done   PNA vac Low Risk Adult (1 of 2 - PCV13) Never done   COVID-19 Vaccine (3 - Moderna risk series) 09/28/2019   INFLUENZA VACCINE  01/28/2021   OPHTHALMOLOGY EXAM  02/08/2021    There are no preventive care reminders to display for this patient.  Lab Results  Component Value Date   TSH 2.320 11/23/2020   Lab Results  Component Value Date   WBC 5.9 11/23/2020   HGB 13.9 11/23/2020   HCT 41.2 11/23/2020   MCV 96 11/23/2020   PLT 172 11/23/2020   Lab Results  Component Value Date   NA 140 11/23/2020   K 5.0 11/23/2020   CO2 17 (L) 11/23/2020   GLUCOSE 264 (H) 11/23/2020   BUN 28 (H) 11/23/2020   CREATININE 1.01 11/23/2020   BILITOT 1.0 11/23/2020   ALKPHOS 91 11/23/2020   AST 12 11/23/2020   ALT 5 11/23/2020   PROT 6.7 11/23/2020   ALBUMIN 4.6 11/23/2020   CALCIUM 9.2 11/23/2020   ANIONGAP 12 03/29/2020   EGFR 80 11/23/2020   Lab Results  Component Value Date    CHOL 135 11/23/2020   Lab Results  Component Value Date   HDL 45 11/23/2020   Lab Results  Component Value Date   LDLCALC 69 11/23/2020   Lab Results  Component Value Date   TRIG 114 11/23/2020   Lab Results  Component Value Date   CHOLHDL 3.0 11/23/2020   Lab Results  Component Value Date   HGBA1C 8.5 (H) 11/23/2020      Assessment & Plan:  Diagnoses and all orders for this visit: Obesity, diabetes, and hypertension syndrome ([place]) -     Basic Metabolic Panel Glucose has climbed with stopping farxiga but he is feeling improved and regained 10 lbs.  Abnormal weight loss Patient is now gaining weight after stopping [place].  Follow BS       Follow-up: Return in about 4 months (around 06/16/2021) for chronic visit.    [name], MD

## 2021-02-15 LAB — BASIC METABOLIC PANEL
BUN/Creatinine Ratio: 26 — ABNORMAL HIGH (ref 10–24)
BUN: 20 mg/dL (ref 8–27)
CO2: 22 mmol/L (ref 20–29)
Calcium: 8.9 mg/dL (ref 8.6–10.2)
Chloride: 97 mmol/L (ref 96–106)
Creatinine, Ser: 0.76 mg/dL (ref 0.76–1.27)
Glucose: 266 mg/dL — ABNORMAL HIGH (ref 65–99)
Potassium: 4.9 mmol/L (ref 3.5–5.2)
Sodium: 136 mmol/L (ref 134–144)
eGFR: 97 mL/min/{1.73_m2} (ref >59)

## 2021-02-15 NOTE — Progress Notes (Signed)
Glucose 266, mild dehydration increase fluids lp

## 2021-02-21 DIAGNOSIS — R339 Retention of urine, unspecified: Secondary | ICD-10-CM | POA: Diagnosis not present

## 2021-02-21 DIAGNOSIS — R351 Nocturia: Secondary | ICD-10-CM | POA: Diagnosis not present

## 2021-02-21 DIAGNOSIS — N401 Enlarged prostate with lower urinary tract symptoms: Secondary | ICD-10-CM | POA: Diagnosis not present

## 2021-02-26 ENCOUNTER — Telehealth: Payer: Self-pay

## 2021-02-26 NOTE — Telephone Encounter (Signed)
Patient assistance for Lantus received today. Medication placed in sample refrigerator. Patient has not been notified.

## 2021-02-27 NOTE — Patient Instructions (Signed)
Visit Information   Goals Addressed             This Visit's Progress    Improve My Heart Health-Coronary Artery Disease   On track    Timeframe:  Long-Range Goal Priority:  High Start Date:           09/11/2020                  Expected End Date: 09/11/2021                       Follow Up Date 10/10/2020    - if I have chest pain, call for help    Why is this important?   Lifestyle changes are key to improving the blood flow to your heart. Think about the things you can change and set a goal to live healthy.  Remember, when the blood vessels to your heart start to get clogged you may not have any symptoms.  Over time, they can get worse.  Don't ignore the signs, like chest pain, and get help right away.     Notes:      Monitor and Manage My Blood Sugar-Diabetes Type 2   On track    Timeframe:  Long-Range Goal Priority:  High Start Date:     09/11/2020                        Expected End Date:       09/11/2021                Follow Up Date 10/10/2020    - check blood sugar at prescribed times - check blood sugar if I feel it is too high or too low - take the blood sugar meter to all doctor visits    Why is this important?   Checking your blood sugar at home helps to keep it from getting very high or very low.  Writing the results in a diary or log helps the doctor know how to care for you.  Your blood sugar log should have the time, date and the results.  Also, write down the amount of insulin or other medicine that you take.  Other information, like what you ate, exercise done and how you were feeling, will also be helpful.     Notes:      Set My Target A1C-Diabetes Type 2   On track    Timeframe:  Long-Range Goal Priority:  High Start Date:            09/11/2020                 Expected End Date:      09/11/2021                 Follow Up Date 10/10/2020    - set target A1C    Why is this important?   Your target A1C is decided together by you and your  doctor.  It is based on several things like your age and other health issues.    Notes:      Track and Manage Activity and Exertion-Heart Failure   On track    Timeframe:  Long-Range Goal Priority:  High Start Date:                09/11/2020             Expected End Date: 09/11/2021  Follow Up Date 10/10/2020     - follow activity or exercise plan - meet with physical therapist    Why is this important?   Exercising is very important when managing your heart failure.  It will help your heart get stronger.    Notes:        Patient Care Plan: CCM Pharmacy Care Plan     Problem Identified: CAD, Diabetes, Hypertension   Priority: High  Onset Date: 08/07/2020     Long-Range Goal: Disease Management   Start Date: 08/07/2020  Expected End Date: 08/07/2021  Recent Progress: On track  Priority: High  Note:   Current Barriers:  Unable to achieve control of Diabetes   Pharmacist Clinical Goal(s):  Patient will achieve control of diabetes as evidenced by a1c and blood sugar readings through collaboration with PharmD and provider.   Interventions: 1:1 collaboration with Abigail Miyamoto, MD regarding development and update of comprehensive plan of care as evidenced by provider attestation and co-signature Inter-disciplinary care team collaboration (see longitudinal plan of care) Comprehensive medication review performed; medication list updated in electronic medical record  Hyperlipidemia: (LDL goal < 55) -Not ideally controlled -Current treatment:  rosuvastatin 10 mg daily  -Medications previously tried: atorvastatin  -Current dietary patterns: eating healthy and trying to adhere to diabetic diet -Current exercise habits: [employer] 5 days a week  -Educated on Cholesterol goals;  Benefits of statin for ASCVD risk reduction; Importance of limiting foods high in cholesterol; Exercise goal of 150 minutes per week; -Counseled on diet and exercise  extensively Recommend continuing current medication   Diabetes (A1c goal <7.5%) -Uncontrolled -Current medications: Lantus 18 units daily  Metformin 1000 mg bid  Freestyle Libre Insulin pen needles  -Medications previously tried: 70/30 insulin  -Current home glucose readings fasting glucose: 64-152 mg/dL post prandial glucose: 140-225 mg/dL -Reports hypoglycemic/hyperglycemic symptoms -Current meal patterns:  breakfast: eggs lunch: trying to eat balanced meals dinner: meat and vegetables snacks: ensure/boost, peanut butter crackers, pretzels drinks: juice to correct low blood sugar -Current exercise:  [employer] m-f when wife can drive and pt for shoulder/parkinson's -Educated on A1c and blood sugar goals; Complications of diabetes including kidney damage, retinal damage, and cardiovascular disease; Exercise goal of 150 minutes per week; Prevention and management of hypoglycemic episodes; Continuous glucose monitoring; -Counseled to check feet daily and get yearly eye exams -Counseled on diet and exercise extensively Recommended patient continue to watch blood sugar closely. Patient to resume Farxiga 5 mg daily with Lantus 10 mg. Patient will call pharmacist with any concerns or questions. Pharmacist sending order for Farxiga 5 mg to [facility].   Patient Goals/Self-Care Activities Patient will:  - take medications as prescribed focus on medication adherence by using pill box check glucose throughout the day with [facility], document, and provide at future appointments target a minimum of 150 minutes of moderate intensity exercise weekly  Follow Up Plan: Telephone follow up appointment with care management team member scheduled for: 02/2021      Patient verbalizes understanding of instructions provided today and agrees to view in MyChart.  Telephone follow up appointment with pharmacy team member scheduled for: 02/2021  Earvin Hansen, [facility]

## 2021-03-14 ENCOUNTER — Telehealth: Payer: Medicare Other

## 2021-03-25 ENCOUNTER — Telehealth: Payer: Self-pay

## 2021-03-25 NOTE — Chronic Care Management (AMB) (Signed)
Chronic Care Management Pharmacy Assistant   Name: Jeffrey Parsons  MRN: [id number] DOB: 1951-04-13   Reason for Encounter: Disease State call for DM and Reschedule 9/15 appt   Recent office visits:  02/14/21 Jeffrey Bulla MD. Seen for DM, HTN, and Obesity. D/C Farxiga 5 mg daily. Follow up in 4 months.  Recent consult visits:  None since 02/07/21 Visit   Hospital visits:  None in previous 6 months  Medications: Outpatient Encounter Medications as of 03/25/2021  Medication Sig Note   acetaminophen (TYLENOL) 500 MG tablet Take 500 mg by mouth daily as needed for moderate pain or headache.     albuterol (VENTOLIN HFA) 108 (90 Base) MCG/ACT inhaler Inhale 2 puffs into the lungs every 6 (six) hours as needed for wheezing or shortness of breath.    aspirin EC 81 MG tablet Take 1 tablet (81 mg total) by mouth daily.    carbidopa-levodopa (SINEMET CR) 50-200 MG tablet Take 1 tablet by mouth every evening.     carbidopa-levodopa (SINEMET IR) 25-250 MG tablet Take 1 tablet by mouth 4 (four) times daily.     carvedilol (COREG) 12.5 MG tablet Take 1 tablet (12.5 mg total) by mouth 2 (two) times daily.    Continuous Blood Gluc Sensor (FREESTYLE LIBRE 2 SENSOR) MISC by Does not apply route.    insulin glargine (LANTUS) 100 UNIT/ML Solostar Pen Inject 18 Units into the skin daily. 02/27/2021: Resumed 18 units when stopping Farxiga   Insulin Pen Needle (PEN NEEDLES 5/16") 30G X 8 MM MISC by Does not apply route.    metFORMIN (GLUCOPHAGE) 500 MG tablet TAKE 2 TABLETS (1,000 MG TOTAL) 2 (TWO) TIMES DAILY WITH A MEAL. START THIS WITH NEW INSULIN    Multiple Vitamin (MULTIVITAMIN) tablet Take 1 tablet by mouth daily.    rosuvastatin (CRESTOR) 10 MG tablet Take 10 mg by mouth daily.    sacubitril-valsartan (ENTRESTO) 49-51 MG Take 1 tablet by mouth 2 (two) times daily.    No facility-administered encounter medications on file as of 03/25/2021.   Recent Relevant Labs: Lab Results  Component Value  Date/Time   HGBA1C 8.5 (H) 11/23/2020 09:30 AM   HGBA1C 7.8 (H) 08/30/2020 08:47 AM   MICROALBUR 80 01/17/2020 08:38 AM    Kidney Function Lab Results  Component Value Date/Time   CREATININE 0.76 02/14/2021 01:41 PM   CREATININE 1.01 11/23/2020 09:30 AM   GFRNONAA 76 05/03/2020 10:04 AM   GFRAA 87 05/03/2020 10:04 AM     Current antihyperglycemic regimen:  Metformin 500 mg 2 tablets two times daily Lantus 100unit Inject 18 units in skin daily     Patient verbally confirms he is taking the above medications as directed.   What recent interventions/DTPs have been made to improve glycemic control:  Pt states no changes   Have there been any recent hospitalizations or ED visits since last visit with CPP? Pt states he has not been in the ER  Patient denies hypoglycemic symptoms, including None  Patient denies hyperglycemic symptoms, including none  How often are you checking your blood sugar? He checks them 4-5 daily   What are your blood sugars ranging? (129-203) 03/25/21 before meal  163 03/24/21 before meal 159  On insulin? Yes How many units: 18 units daily   During the week, how often does your blood glucose drop below 70? Pt states since 03/19/21 his sugars have fallen below 70 twice . 03/16/21 53 during the night 03/01/21 56 during the night 02/26/21 53  during the night   Are you checking your feet daily/regularly? Pt states he is checking his feet daily when he gets out of bed.   Adherence Review: Is the patient currently on a STATIN medication? Yes Is the patient currently on ACE/ARB medication? Yes Does the patient have >5 day gap between last estimated fill dates? No, pt states he has enough of week left and is due for one soon   Care Gaps: Last eye exam / Retinopathy Screening? Overdue since 02/08/21 Last Annual Wellness Visit? Done on 01/25/19 Last Diabetic Foot Exam? Next due on 08/30/21   Star Rating Drugs:  Medication:  Last Fill: Day Supply Metformin    02/03/21  90ds Rosuvastatin   01/07/21 90ds  Pt states the patient assistance keeps sending him [name] in the mail and wants this to stop since he is no longer taking this medication. Sent a message to the provider.   Roxana Hires CMA  [facility]

## 2021-03-26 ENCOUNTER — Telehealth: Payer: Self-pay

## 2021-03-26 NOTE — Chronic Care Management (AMB) (Signed)
    Chronic Care Management Pharmacy Assistant   Name: Jeffrey Parsons  MRN: [id number] DOB: June 08, 1951   Reason for Encounter: [name] Patient Assistance Documentation    Medications: Outpatient Encounter Medications as of 03/26/2021  Medication Sig Note   acetaminophen (TYLENOL) 500 MG tablet Take 500 mg by mouth daily as needed for moderate pain or headache.     albuterol (VENTOLIN HFA) 108 (90 Base) MCG/ACT inhaler Inhale 2 puffs into the lungs every 6 (six) hours as needed for wheezing or shortness of breath.    aspirin EC 81 MG tablet Take 1 tablet (81 mg total) by mouth daily.    carbidopa-levodopa (SINEMET CR) 50-200 MG tablet Take 1 tablet by mouth every evening.     carbidopa-levodopa (SINEMET IR) 25-250 MG tablet Take 1 tablet by mouth 4 (four) times daily.     carvedilol (COREG) 12.5 MG tablet Take 1 tablet (12.5 mg total) by mouth 2 (two) times daily.    Continuous Blood Gluc Sensor (FREESTYLE LIBRE 2 SENSOR) MISC by Does not apply route.    insulin glargine (LANTUS) 100 UNIT/ML Solostar Pen Inject 18 Units into the skin daily. 02/27/2021: Resumed 18 units when stopping Farxiga   Insulin Pen Needle (PEN NEEDLES 5/16") 30G X 8 MM MISC by Does not apply route.    metFORMIN (GLUCOPHAGE) 500 MG tablet TAKE 2 TABLETS (1,000 MG TOTAL) 2 (TWO) TIMES DAILY WITH A MEAL. START THIS WITH NEW INSULIN    Multiple Vitamin (MULTIVITAMIN) tablet Take 1 tablet by mouth daily.    rosuvastatin (CRESTOR) 10 MG tablet Take 10 mg by mouth daily.    sacubitril-valsartan (ENTRESTO) 49-51 MG Take 1 tablet by mouth 2 (two) times daily.    No facility-administered encounter medications on file as of 03/26/2021.    I spoke with the pt and he stated that he continues getting [name] in the mail from patient assistance. I don't see in his chart where that was started and it looks like its been noted as d/c on the patient assistance log. I spoke with pt today and advised if he continues getting them in the  mail to let me know. He stated his last fill that was sent to him was over a month ago. Pt understood and advised he will keep me informed.    Roxana Hires CMA  [facility]

## 2021-03-26 NOTE — Telephone Encounter (Signed)
Patient experiencing hypoglycemia, will alert PCP and defer to them

## 2021-04-04 ENCOUNTER — Other Ambulatory Visit: Payer: Self-pay | Admitting: Cardiovascular Disease

## 2021-04-10 ENCOUNTER — Ambulatory Visit (INDEPENDENT_AMBULATORY_CARE_PROVIDER_SITE_OTHER): Payer: Medicare Other

## 2021-04-10 DIAGNOSIS — I442 Atrioventricular block, complete: Secondary | ICD-10-CM

## 2021-04-12 ENCOUNTER — Telehealth: Payer: Self-pay

## 2021-04-12 LAB — CUP PACEART REMOTE DEVICE CHECK
Battery Remaining Longevity: 57 mo
Battery Voltage: 2.97 V
Brady Statistic AP VP Percent: 0.25 %
Brady Statistic AP VS Percent: 0.01 %
Brady Statistic AS VP Percent: 95.95 %
Brady Statistic AS VS Percent: 3.8 %
Brady Statistic RA Percent Paced: 0.25 %
Brady Statistic RV Percent Paced: 95.55 %
Date Time Interrogation Session: 20221012052826
HighPow Impedance: 46 Ohm
Implantable Lead Implant Date: 20111213
Implantable Lead Implant Date: 20191230
Implantable Lead Implant Date: 20191230
Implantable Lead Location: 753858
Implantable Lead Location: 753859
Implantable Lead Location: 753860
Implantable Lead Model: 5076
Implantable Pulse Generator Implant Date: 20191230
Lead Channel Impedance Value: 190 Ohm
Lead Channel Impedance Value: 190 Ohm
Lead Channel Impedance Value: 214.783
Lead Channel Impedance Value: 214.783
Lead Channel Impedance Value: 214.783
Lead Channel Impedance Value: 285 Ohm
Lead Channel Impedance Value: 342 Ohm
Lead Channel Impedance Value: 380 Ohm
Lead Channel Impedance Value: 380 Ohm
Lead Channel Impedance Value: 380 Ohm
Lead Channel Impedance Value: 380 Ohm
Lead Channel Impedance Value: 494 Ohm
Lead Channel Impedance Value: 646 Ohm
Lead Channel Impedance Value: 646 Ohm
Lead Channel Impedance Value: 646 Ohm
Lead Channel Impedance Value: 760 Ohm
Lead Channel Impedance Value: 760 Ohm
Lead Channel Impedance Value: 779 Ohm
Lead Channel Pacing Threshold Amplitude: 0.5 V
Lead Channel Pacing Threshold Amplitude: 0.75 V
Lead Channel Pacing Threshold Amplitude: 1 V
Lead Channel Pacing Threshold Pulse Width: 0.4 ms
Lead Channel Pacing Threshold Pulse Width: 0.4 ms
Lead Channel Pacing Threshold Pulse Width: 0.4 ms
Lead Channel Sensing Intrinsic Amplitude: 2 mV
Lead Channel Sensing Intrinsic Amplitude: 2 mV
Lead Channel Sensing Intrinsic Amplitude: 23 mV
Lead Channel Sensing Intrinsic Amplitude: 23 mV
Lead Channel Setting Pacing Amplitude: 1.5 V
Lead Channel Setting Pacing Amplitude: 1.5 V
Lead Channel Setting Pacing Amplitude: 2 V
Lead Channel Setting Pacing Pulse Width: 0.4 ms
Lead Channel Setting Pacing Pulse Width: 0.4 ms
Lead Channel Setting Sensing Sensitivity: 0.45 mV

## 2021-04-12 MED ORDER — FUROSEMIDE 40 MG PO TABS: ORAL_TABLET | ORAL | 0 refills | Status: DC

## 2021-04-12 NOTE — Telephone Encounter (Signed)
Spoke with pt and advised per Jeffrey Saber, PA-C pt should take Furosemide 40mg  - 1 tablet by mouth daily x 3 days and then stop.  Will re-evaluate at his appointment on 10/20 with 11/20, PA-C and obtain lab work.  Pt verbalizes understanding and agrees with current plan.

## 2021-04-12 NOTE — Telephone Encounter (Signed)
Spoke with pt and appointment scheduled for 04/18/2021  with Francis Dowse, PA-C.  Pt reports swelling of his feet and ankles.  Denies SOB or CP at this time.  He is taking medications as prescribed.  He states his wife has some Furosemide on hand and would like to take some of hers.  Pt advised he should not take someone else's medication.  Encouraged pt to elevate legs as much as possible when sitting and to decrease Na+ intake to 2000mg  or less daily. Will forward to , PA-C for review and recommendation of diuretic as Dr Otilio Saber is out of the office.  Pt verbalizes understanding and agrees with current plan.

## 2021-04-12 NOTE — Telephone Encounter (Signed)
Scheduled remote reviewed. Normal device function.  Optivol crossed threshold 8/15 and is ongoing, route to triage. A couple of the VSE appear to be runs of NSVT Next remote 91 days.   Patient reports of increased leg swelling since Tuesday/Wednesday this week. Denies other fluid complaints including shortness of breath. Reports compliance with medications on file including coreg 12.5 mg BID, Entresto 49-51 mg BID.   Patient is overdue with MD/APP in office. Message sent to scheduler to schedule.

## 2021-04-16 ENCOUNTER — Ambulatory Visit (INDEPENDENT_AMBULATORY_CARE_PROVIDER_SITE_OTHER): Payer: Medicare Other

## 2021-04-16 ENCOUNTER — Other Ambulatory Visit: Payer: Self-pay

## 2021-04-16 DIAGNOSIS — E1169 Type 2 diabetes mellitus with other specified complication: Secondary | ICD-10-CM

## 2021-04-16 DIAGNOSIS — E782 Mixed hyperlipidemia: Secondary | ICD-10-CM

## 2021-04-16 DIAGNOSIS — E119 Type 2 diabetes mellitus without complications: Secondary | ICD-10-CM

## 2021-04-16 DIAGNOSIS — I700 Atherosclerosis of aorta: Secondary | ICD-10-CM

## 2021-04-16 NOTE — Patient Instructions (Signed)
Visit Information   Goals Addressed   None    Patient Care Plan: CCM Pharmacy Care Plan     Problem Identified: CAD, Diabetes, Hypertension   Priority: High  Onset Date: 08/07/2020     Long-Range Goal: Disease Management   Start Date: 08/07/2020  Expected End Date: 08/07/2021  Recent Progress: On track  Priority: High  Note:   Current Barriers:  Unable to achieve control of Diabetes   Pharmacist Clinical Goal(s):  Patient will achieve control of diabetes as evidenced by a1c and blood sugar readings through collaboration with PharmD and provider.   Interventions: 1:1 collaboration with [name], MD regarding development and update of comprehensive plan of care as evidenced by provider attestation and co-signature Inter-disciplinary care team collaboration (see longitudinal plan of care) Comprehensive medication review performed; medication list updated in electronic medical record  Hyperlipidemia: (LDL goal < 55) -Not ideally controlled -Current treatment:  rosuvastatin 10 mg daily  -Medications previously tried: atorvastatin  -Current dietary patterns: eating healthy and trying to adhere to diabetic diet -Current exercise habits: [employer] 5 days a week  -Educated on Cholesterol goals;  Benefits of statin for ASCVD risk reduction; Importance of limiting foods high in cholesterol; Exercise goal of 150 minutes per week; -Counseled on diet and exercise extensively Recommend continuing current medication   Diabetes (A1c goal <7.5%) Lab Results  Component Value Date   HGBA1C 8.5 (H) 11/23/2020   HGBA1C 7.8 (H) 08/30/2020   HGBA1C 8.6 (H) 05/07/2020   Lab Results  Component Value Date   MICROALBUR 80 01/17/2020   LDLCALC 69 11/23/2020   CREATININE 0.76 02/14/2021   Lab Results  Component Value Date   NA 136 02/14/2021   K 4.9 02/14/2021   CREATININE 0.76 02/14/2021   EGFR 97 02/14/2021   GFRNONAA 76 05/03/2020   GLUCOSE 266 (H) 02/14/2021    Lab Results  Component Value Date   WBC 5.9 11/23/2020   HGB 13.9 11/23/2020   HCT 41.2 11/23/2020   MCV 96 11/23/2020   PLT 172 11/23/2020  -Uncontrolled -Current medications: Lantus 16-18 SS units AM Metformin 1000 mg bid  Freestyle Libre (PAP) Insulin pen needles  -Medications previously tried: 70/30 insulin, Farxiga -Current home glucose readings fasting glucose: 64-152 mg/dL post prandial glucose: 140-225 mg/dL -Reports hypoglycemic/hyperglycemic symptoms -Current meal patterns:  breakfast: eggs lunch: trying to eat balanced meals dinner: meat and vegetables snacks: ensure/boost, peanut butter crackers, pretzels drinks: juice to correct low blood sugar -Current exercise:  [employer] m-f when wife can drive and pt for shoulder/parkinson's -Educated on A1c and blood sugar goals; Complications of diabetes including kidney damage, retinal damage, and cardiovascular disease; Exercise goal of 150 minutes per week; Prevention and management of hypoglycemic episodes; Continuous glucose monitoring; -Counseled to check feet daily and get yearly eye exams -Counseled on diet and exercise extensively August 2022: Recommended patient continue to watch blood sugar closely. Patient to resume Farxiga 5 mg daily with Lantus 10 mg. Patient will call pharmacist with any concerns or questions. Pharmacist sending order for Farxiga 5 mg to [facility].  October 2022: Patient stopped Farxiga and sugars are swinging too high and low. He states that with his dose of insulin in the morning he is chasing it all day and sometimes the dose is too strong and he drops too low at night. There are a few options,  1: Add Short acting insulin so he isn't running hypo multiple times during the day due to the lantus B: Replace  lantus with GLP (Which has less hypoglycemia). Then taper down insulin.  Will ask PCP if option B is agreeable, if so, will start PAP ASAP because his insurance won't cover it -Also,  Libre PAP needs to be renewed, he told me the company will be reaching out to [place] so we can fax his note Hx  Cardio -Managed by Cardio -Controlled -Current treatment  Entresto 49/51 (PAP) Furosemide PRN 27m -Medications previously tried: N/A  -Recommended to continue current medication   Patient Goals/Self-Care Activities Patient will:  - take medications as prescribed focus on medication adherence by using pill box check glucose throughout the day [facility], and provide at future appointments target a minimum of 150 minutes of moderate intensity exercise weekly  Follow Up Plan: Telephone follow up appointment with care management team member scheduled for: April 2023      The patient verbalized understanding of instructions, educational materials, and care plan provided today and declined offer to receive copy of patient instructions, educational materials, and care plan.  The pharmacy team will reach out to the patient again over the next 90 days.   NLane Hacker [facility]

## 2021-04-16 NOTE — Progress Notes (Signed)
Chronic Care Management Pharmacy Note  04/16/2021 Name:  Jeffrey Parsons MRN:  [id number] DOB:  10/14/1950   Plan Summary/Recommendations:   Stopped farxiga due to frequent urinations and harder stools. Stopped [place]ade 01/17/2021. Blood sugar numbers increased to 200s 07/24. He has increase to insulin back up to 18 units daily. His sugars are running either too high or too low and he's chasing them all day (And evening in the middle of the night).  The Lantus lasts too long for him to be able to guess what he's going to be eating/doing that day. Could we try a GLP instead and hopefully taper down off the Lantus. Ideally, if he could get him controlled on GLP and Metformin only and an A1c<8, I think he'd be fairly content. If so, please send me a msg and I'll start the process of a PAP, his insurance won't cover GLP, thank you    Subjective: Jeffrey Parsons is an 70 y.o. year old male who is a primary patient of [name], Zeb Comfort, MD.  The CCM team was consulted for assistance with disease management and care coordination needs.    Engaged with patient by telephone for follow up visit in response to provider referral for pharmacy case management and/or care coordination services.   Consent to Services:  The patient was given information about Chronic Care Management services, agreed to services, and gave verbal consent prior to initiation of services.  Please see initial visit note for detailed documentation.   Patient Care Team: [name], MD as PCP - General (Family Medicine) Nahser, Wonda Cheng, MD as PCP - Cardiology (Cardiology) Deboraha Sprang, MD as PCP - Electrophysiology (Cardiology) Burnice Logan, [facility] (Inactive) (Pharmacist)  Recent office visits: 01/10/2021 - refer to urology for workup on enlarged prostate.   11/23/2020 - Dr. [name] ordering tests to assess weight loss. Hold [place] for now. Hydrate and watch diet for improved blood sugar management.   [id number] - no changes. Continue Crestor.   Recent consult visits: 09/03/2020 - neuro - refer to PT for PD and balance. Reduce topiramate to 1 tab in the morning. After 2 weeks if no worsening of your tremor, please stop taking altogether. Continue to exercise. Consider lowering bp medications as much as possible. People with PD have low bp and are sensitive to low bp.    Hospital visits:   Objective:  Lab Results  Component Value Date   CREATININE 0.76 02/14/2021   BUN 20 02/14/2021   GFRNONAA 76 05/03/2020   GFRAA 87 05/03/2020   NA 136 02/14/2021   K 4.9 02/14/2021   CALCIUM 8.9 02/14/2021   CO2 22 02/14/2021    Lab Results  Component Value Date/Time   HGBA1C 8.5 (H) 11/23/2020 09:30 AM   HGBA1C 7.8 (H) 08/30/2020 08:47 AM   MICROALBUR 80 01/17/2020 08:38 AM    Last diabetic Eye exam: No results found for: HMDIABEYEEXA  Last diabetic Foot exam: No results found for: HMDIABFOOTEX   Lab Results  Component Value Date   CHOL 135 11/23/2020   HDL 45 11/23/2020   LDLCALC 69 11/23/2020   TRIG 114 11/23/2020   CHOLHDL 3.0 11/23/2020    Hepatic Function Latest Ref Rng & Units 11/23/2020 08/30/2020 05/03/2020  Total Protein 6.0 - 8.5 g/dL 6.7 6.3 6.5  Albumin 3.8 - 4.8 g/dL 4.6 4.1 4.4  AST 0 - 40 IU/L 12 14 14   ALT 0 - 44 IU/L 5 4 2   Alk Phosphatase 44 - 121 IU/L  91 106 121  Total Bilirubin 0.0 - 1.2 mg/dL 1.0 0.7 1.0  Bilirubin, Direct 0.00 - 0.40 mg/dL - - 0.23    Lab Results  Component Value Date/Time   TSH 2.320 11/23/2020 09:30 AM   TSH 1.440 05/22/2020 08:49 AM    CBC Latest Ref Rng & Units 11/23/2020 08/30/2020 03/29/2020  WBC 3.4 - 10.8 x10E3/uL 5.9 7.1 7.0  Hemoglobin 13.0 - 17.7 g/dL 13.9 13.2 12.8(L)  Hematocrit 37.5 - 51.0 % 41.2 40.4 38.1(L)  Platelets 150 - 450 x10E3/uL 172 160 176    No results found for: VD25OH  Clinical ASCVD: Yes  The ASCVD Risk score (Arnett DK, et al., 2019) failed to calculate for the following reasons:   The patient has a  prior MI or stroke diagnosis    Depression screen [facility] 2/9 01/17/2020  Decreased Interest 0  Down, Depressed, Hopeless 0  PHQ - 2 Score 0  Altered sleeping 0  Tired, decreased energy 1  Change in appetite 0  Feeling bad or failure about yourself  0  Trouble concentrating 0  Moving slowly or fidgety/restless 1  Suicidal thoughts 0  PHQ-9 Score 2  Difficult doing work/chores Not difficult at all     Other: (CHADS2VASc if Afib, MMRC or CAT for COPD, ACT, DEXA)  Social History   Tobacco Use  Smoking Status Never  Smokeless Tobacco Never   BP Readings from Last 3 Encounters:  02/14/21 110/60  01/10/21 100/60  11/23/20 124/64   Pulse Readings from Last 3 Encounters:  02/14/21 92  01/10/21 88  11/23/20 93   Wt Readings from Last 3 Encounters:  02/14/21 147 lb (66.7 kg)  01/10/21 137 lb (62.1 kg)  11/23/20 142 lb (64.4 kg)    Assessment/Interventions: Review of patient past medical history, allergies, medications, health status, including review of consultants reports, laboratory and other test data, was performed as part of comprehensive evaluation and provision of chronic care management services.   SDOH:  (Social Determinants of Health) assessments and interventions performed: Yes   CCM Care Plan  Allergies  Allergen Reactions   Farxiga [Dapagliflozin] Other (See Comments)    Patient lost weight.   Onion     Upset stomach   Sulfa Antibiotics Other (See Comments)    FLUSH     Medications Reviewed Today     Reviewed by Lane Hacker, [facility] (Pharmacist) on 04/16/21 at 1446  Med List Status: <None>   Medication Order Taking? Sig Documenting Provider Last Dose Status Informant  acetaminophen (TYLENOL) 500 MG tablet [id number]  Take 500 mg by mouth daily as needed for moderate pain or headache.  [provider]  Active Multiple Informants  albuterol (VENTOLIN HFA) 108 (90 Base) MCG/ACT inhaler [id number]  Inhale 2 puffs into the lungs every 6 (six) hours  as needed for wheezing or shortness of breath. [provider]  Active Multiple Informants  aspirin EC 81 MG tablet [id number] Yes Take 1 tablet (81 mg total) by mouth daily. Richardson Dopp T, PA-C Taking Active Multiple Informants  carbidopa-levodopa (SINEMET CR) 50-200 MG tablet [id number] Yes Take 1 tablet by mouth every evening.  [provider] Taking Active Multiple Informants  carbidopa-levodopa (SINEMET IR) 25-250 MG tablet [id number] Yes Take 1 tablet by mouth 4 (four) times daily.  [provider] Taking Active Multiple Informants  carvedilol (COREG) 12.5 MG tablet [id number] Yes Take 1 tablet (12.5 mg total) by mouth 2 (two) times daily. Nahser, Wonda Cheng, MD Taking Active   Continuous  Blood Gluc Sensor (FREESTYLE LIBRE 2 SENSOR) MISC [id number] Yes by Does not apply route. [provider] Taking Active Multiple Informants  furosemide (LASIX) 40 MG tablet [id number] Yes Take 1 tablet by mouth daily x 3 days. Shirley Friar, PA-C Taking Active   insulin glargine (LANTUS) 100 UNIT/ML Solostar Pen [id number] Yes Inject 18 Units into the skin daily. [provider] Taking Active            Med Note Owens Shark, [place]   Wed Feb 27, 2021  9:50 AM) Resumed 18 units when stopping Farxiga  Insulin Pen Needle (PEN NEEDLES 5/16") 30G X 8 MM MISC [id number]  by Does not apply route. [provider]  Active Multiple Informants  metFORMIN (GLUCOPHAGE) 500 MG tablet [id number] Yes TAKE 2 TABLETS (1,000 MG TOTAL) 2 (TWO) TIMES DAILY WITH A MEAL. START THIS WITH NEW INSULIN [name], MD Taking Active   Multiple Vitamin (MULTIVITAMIN) tablet [id number]  Take 1 tablet by mouth daily. [provider]  Active Multiple Informants  rosuvastatin (CRESTOR) 10 MG tablet [id number] Yes TAKE 1 TABLET BY MOUTH EVERY DAY Nahser, Wonda Cheng, MD Taking Active   sacubitril-valsartan [facility]) 49-51 MG [id number] Yes Take 1 tablet by mouth 2 (two) times  daily. Nahser, Wonda Cheng, MD Taking Active Multiple Informants            Patient Active Problem List   Diagnosis Date Noted   Atherosclerosis of aorta ([place]) 01/10/2021   Abnormal weight loss 11/23/2020   Fatigue 05/22/2020   BPH with obstruction/lower urinary tract symptoms 05/22/2020   BMI 23.0-23.9, adult 01/17/2020   Sick sinus syndrome ([place]) 09/13/2019   Obesity, diabetes, and hypertension syndrome ([place]) 09/13/2019   Secondary dystonia 07/04/2019   Essential hypertension 06/26/2018   Complete heart block ([place]) 05/26/2017   Cardiac device in situ 05/26/2017   Chronic combined systolic and diastolic CHF (congestive heart failure) ([place]) 05/26/2017   Ischemic cardiomyopathy 05/26/2017   Allergy to sulfa drugs 12/04/2016   S/P CABG x 5 09/12/2016   Mixed hyperlipidemia 09/12/2016   Coronary artery disease involving native coronary artery of native heart without angina pectoris 09/12/2016   Pacemaker 06/06/2016   Long term (current) use of aspirin 12/06/2015   Mild obesity 12/06/2015   Other long term (current) drug therapy 12/06/2015   Mild intermittent asthma with acute exacerbation 12/07/2014   Parkinson's disease ([place]) 12/07/2014    Immunization History  Administered Date(s) Administered   Moderna Sars-Covid-2 Vaccination 08/03/2019, 08/31/2019    Conditions to be addressed/monitored:  Hyperlipidemia and Diabetes  Care Plan : [place]  Updates made by Lane Hacker, RPH since 04/16/2021 12:00 AM     Problem: CAD, Diabetes, Hypertension   Priority: High  Onset Date: 08/07/2020     Long-Range Goal: Disease Management   Start Date: 08/07/2020  Expected End Date: 08/07/2021  Recent Progress: On track  Priority: High  Note:   Current Barriers:  Unable to achieve control of Diabetes   Pharmacist Clinical Goal(s):  Patient will achieve control of diabetes as evidenced by a1c and blood sugar readings through collaboration with PharmD and provider.    Interventions: 1:1 collaboration with [name], MD regarding development and update of comprehensive plan of care as evidenced by provider attestation and co-signature Inter-disciplinary care team collaboration (see longitudinal plan of care) Comprehensive medication review performed; medication list updated in electronic medical record  Hyperlipidemia: (LDL goal < 55) -Not ideally controlled -Current treatment:  rosuvastatin  10 mg daily  -Medications previously tried: atorvastatin  -Current dietary patterns: eating healthy and trying to adhere to diabetic diet -Current exercise habits: [employer] 5 days a week  -Educated on Cholesterol goals;  Benefits of statin for ASCVD risk reduction; Importance of limiting foods high in cholesterol; Exercise goal of 150 minutes per week; -Counseled on diet and exercise extensively Recommend continuing current medication   Diabetes (A1c goal <7.5%) Lab Results  Component Value Date   HGBA1C 8.5 (H) 11/23/2020   HGBA1C 7.8 (H) 08/30/2020   HGBA1C 8.6 (H) 05/07/2020   Lab Results  Component Value Date   MICROALBUR 80 01/17/2020   LDLCALC 69 11/23/2020   CREATININE 0.76 02/14/2021   Lab Results  Component Value Date   NA 136 02/14/2021   K 4.9 02/14/2021   CREATININE 0.76 02/14/2021   EGFR 97 02/14/2021   GFRNONAA 76 05/03/2020   GLUCOSE 266 (H) 02/14/2021   Lab Results  Component Value Date   WBC 5.9 11/23/2020   HGB 13.9 11/23/2020   HCT 41.2 11/23/2020   MCV 96 11/23/2020   PLT 172 11/23/2020  -Uncontrolled -Current medications: Lantus 16-18 SS units AM Metformin 1000 mg bid  Freestyle Libre (PAP) Insulin pen needles  -Medications previously tried: 70/30 insulin, Farxiga -Current home glucose readings fasting glucose: 64-152 mg/dL post prandial glucose: 140-225 mg/dL -Reports hypoglycemic/hyperglycemic symptoms -Current meal patterns:  breakfast: eggs lunch: trying to eat balanced meals dinner:  meat and vegetables snacks: ensure/boost, peanut butter crackers, pretzels drinks: juice to correct low blood sugar -Current exercise:  [employer] m-f when wife can drive and pt for shoulder/parkinson's -Educated on A1c and blood sugar goals; Complications of diabetes including kidney damage, retinal damage, and cardiovascular disease; Exercise goal of 150 minutes per week; Prevention and management of hypoglycemic episodes; Continuous glucose monitoring; -Counseled to check feet daily and get yearly eye exams -Counseled on diet and exercise extensively August 2022: Recommended patient continue to watch blood sugar closely. Patient to resume Farxiga 5 mg daily with Lantus 10 mg. Patient will call pharmacist with any concerns or questions. Pharmacist sending order for Farxiga 5 mg to [facility].  October 2022: Patient stopped Farxiga and sugars are swinging too high and low. He states that with his dose of insulin in the morning he is chasing it all day and sometimes the dose is too strong and he drops too low at night. There are a few options,  1: Add Short acting insulin so he isn't running hypo multiple times during the day due to the lantus B: Replace lantus with GLP (Which has less hypoglycemia). Then taper down insulin.  Will ask PCP if option B is agreeable, if so, will start PAP ASAP because his insurance won't cover it -Also, Libre PAP needs to be renewed, he told me the company will be reaching out to [place] so we can fax his note Hx  Cardio -Managed by Cardio -Controlled -Current treatment  Entresto 49/51 (PAP) Furosemide PRN 12m -Medications previously tried: N/A  -Recommended to continue current medication   Patient Goals/Self-Care Activities Patient will:  - take medications as prescribed focus on medication adherence by using pill box check glucose throughout the day [facility] document, and provide at future appointments target a minimum of 150 minutes of  moderate intensity exercise weekly  Follow Up Plan: Telephone follow up appointment with care management team member scheduled for: April 2023        Medication Assistance:  EDelene [place]ol[place] Lantus obtained  through [employer] and [employer] medication assistance program.  Enrollment ends 15/40/0867   Application submitted for Farxiga 5 mg daily. Expected start date 11/28/2020.  -Stopped Farxiga October 2022 -Also needs help with [facility] -Potential application for GLP PAP based on PCP's approval starting October 2022  Patient's preferred pharmacy is:  [id number] [address] [address]Phone: [phone] Fax: [address]: [phone]: [phone] Uses pill box? Yes Pt endorses excellent compliance  We discussed: Benefits of medication synchronization, packaging and delivery as well as enhanced pharmacist oversight with [employer]. Patient decided to: Continue current medication management strategy  Care Plan and Follow Up Patient Decision:  Patient agrees to Care Plan and Follow-up.  Plan: Telephone follow up appointment with care management team member scheduled for:  01/23

## 2021-04-17 NOTE — Progress Notes (Signed)
Cardiology Office Note Date:  04/18/2021  Patient ID:  Jeffrey Parsons, Jeffrey Parsons January 10, 1951, MRN [id number] PCP:  Abigail Miyamoto, MD  Cardiologist:  Dr. Elease Hashimoto >>> Dr. Servando Salina (Mady Haagensen was better for him) >> pending Dr. Dulce Sellar next month Electrophysiologist: Dr. Graciela Husbands     Chief Complaint:  over due annual visit, OptiVol increasing  History of Present Illness: Alik Mawson is a 70 y.o. male with history of CHB w/ PPM > CRT D, parkinson's disease, CAD (CABG 2001), ICM, Chronic CHF(systolic), HLD  He comes in today to be seen for Dr. Graciela Husbands, last seen by him Oct 2021, discussed had an ER visit with symptomatic bradycardia 2/2 T wave oversensing and was able to program to resolve.  More recently saw Dr. Servando Salina to establish care at the [facility] clinic (previously followed by Dr. Elease Hashimoto), he was doing well.  Noted again, BP would be a limiting factor for GDMT.  Planned to transition to another [place] cardiologist for further follow up.  TODAY He is accompanied by his wife. She is very knowledgeable regarding the patient's medicines and care She follows his device checks and saw the OptiVol curve high the timing correlates with his swelling. He did take 3 days of lasix as advised via phone with improvement in his swelling but not resolved. All in all though doing OK He denies any CP or SOB No symptoms of PND or orthopnea. His wife mentions that he had lost weight and of late have bee trying to get him to eat better and get some weight back on, thi has included more deli meats/sandwiches of late. No dizzy spells, near syncope or syncope  He has stopped the Crestor, says is expensive and did not think he needed it   Device information CRT-D, implanted 06/28/2018, RA lead is from prior PPM system implanted 06/11/2010 He has an abandoned RV pacing lead in place   Past Medical History:  Diagnosis Date   AICD (automatic cardioverter/defibrillator) present    Complete heart block (HCC)  05/26/2017   Coronary artery disease involving native coronary artery of native heart without angina pectoris 09/12/2016   Ischemic cardiomyopathy 05/26/2017   Pneumonia 2019   Retinopathy     Past Surgical History:  Procedure Laterality Date   BI-VENTRICULAR IMPLANTABLE CARDIOVERTER DEFIBRILLATOR UPGRADE  06/25/2018   BIV UPGRADE N/A 06/25/2018   Procedure: BIV ICD UPGRADE;  Surgeon: Duke Salvia, MD;  Location: [facility] INVASIVE CV LAB;  Service: Cardiovascular;  Laterality: N/A;   CARDIAC CATHETERIZATION  2001; ~ 2013   CATARACT EXTRACTION W/ INTRAOCULAR LENS  IMPLANT, BILATERAL Bilateral    CORONARY ARTERY BYPASS GRAFT  2001   "CABG X5"   INSERT / REPLACE / REMOVE PACEMAKER  05/2010   KNEE SURGERY Left    AS A CHILD   R shoulder surgery  05/2020   RIGHT/LEFT HEART CATH AND CORONARY/GRAFT ANGIOGRAPHY N/A 06/16/2017   Procedure: RIGHT/LEFT HEART CATH AND CORONARY/GRAFT ANGIOGRAPHY;  Surgeon: Tonny Bollman, MD;  Location: [facility] INVASIVE CV LAB;  Service: Cardiovascular;  Laterality: N/A;   TONSILLECTOMY      Current Outpatient Medications  Medication Sig Dispense Refill   acetaminophen (TYLENOL) 500 MG tablet Take 500 mg by mouth daily as needed for moderate pain or headache.      albuterol (VENTOLIN HFA) 108 (90 Base) MCG/ACT inhaler Inhale 2 puffs into the lungs every 6 (six) hours as needed for wheezing or shortness of breath.     aspirin EC 81 MG tablet Take 1 tablet (81  mg total) by mouth daily.     carbidopa-levodopa (SINEMET CR) 50-200 MG tablet Take 1 tablet by mouth every evening.      carbidopa-levodopa (SINEMET IR) 25-250 MG tablet Take 1 tablet by mouth 4 (four) times daily.      carvedilol (COREG) 12.5 MG tablet Take 1 tablet (12.5 mg total) by mouth 2 (two) times daily. 180 tablet 3   Continuous Blood Gluc Sensor (FREESTYLE LIBRE 2 SENSOR) MISC by Does not apply route.     furosemide (LASIX) 20 MG tablet Take 1 tablet by mouth daily 90 tablet 1   insulin glargine (LANTUS)  100 UNIT/ML Solostar Pen Inject 18 Units into the skin daily.     Insulin Pen Needle (PEN NEEDLES 5/16") 30G X 8 MM MISC by Does not apply route.     metFORMIN (GLUCOPHAGE) 500 MG tablet TAKE 2 TABLETS (1,000 MG TOTAL) 2 (TWO) TIMES DAILY WITH A MEAL. START THIS WITH NEW INSULIN 360 tablet 2   Multiple Vitamin (MULTIVITAMIN) tablet Take 1 tablet by mouth daily.     sacubitril-valsartan (ENTRESTO) 49-51 MG Take 1 tablet by mouth 2 (two) times daily. 180 tablet 3   rosuvastatin (CRESTOR) 10 MG tablet TAKE 1 TABLET BY MOUTH EVERY DAY (Patient not taking: Reported on 04/18/2021) 90 tablet 0   No current facility-administered medications for this visit.    Allergies:   Farxiga [dapagliflozin], Onion, and Sulfa antibiotics   Social History:  The patient  reports that he has never smoked. He has never used smokeless tobacco. He reports that he does not drink alcohol and does not use drugs.   Family History:  The patient's family history includes Bronchitis in his mother; Coronary artery disease in his father; Heart disease in his father; Hypertension in his brother and sister.  ROS:  Please see the history of present illness.    All other systems are reviewed and otherwise negative.   PHYSICAL EXAM:  VS:  BP 120/68   Pulse 87   Ht 5\' 7"  (1.702 m)   Wt 161 lb (73 kg)   SpO2 98%   BMI 25.22 kg/m  BMI: Body mass index is 25.22 kg/m. Well nourished, well developed, in no acute distress HEENT: normocephalic, atraumatic Neck: no JVD, carotid bruits or masses Cardiac:  RRR; no significant murmurs, no rubs, or gallops Lungs:  CTA b/l, no wheezing, rhonchi or rales Abd: soft, nontender MS: no deformity or atrophy Ext: 1++ edema to mid shin Skin: warm and dry, no rash Neuro:  No gross deficits appreciated, b/l hand tremor Psych: euthymic mood, full affect  ICD site is stable, no tethering or discomfort   EKG:  done today and reviewed by myself SR, V paced, PVCs, 87bpm  Device  interrogation done today and reviewed by myself:  Battery and lead measurements are good One NSVT that is old 95.3%VP 93.9 effective  No R waves at 40   06/23/2020: TTE LVEF 30-35%, + WMA Impaired relaxation Trace MR, mild TR    06/16/2017: LHC Prox RCA to Mid RCA lesion is 100% stenosed. Ost Cx to Mid Cx lesion is 100% stenosed. Ost LM to Dist LM lesion is 75% stenosed. Ost 1st Diag to 1st Diag lesion is 90% stenosed. Prox LAD-1 lesion is 80% stenosed. Prox LAD-2 lesion is 100% stenosed. LIMA graft was visualized by angiography and is normal in caliber. The graft exhibits no disease. Left radial artery graft was visualized by angiography and is normal in caliber. The graft exhibits no  disease. SVG graft was visualized by angiography. The graft exhibits mild diffuse disease. Prox Graft lesion is 100% stenosed. SVG. SVG. Origin to Prox Graft lesion is 100% stenosed.   1. Severe native 3 vessel CAD with total occlusion of the RCA, LCx, and LAD 2. S/P CABG with continued patency of the LIMA-LAD, SVG-diagonal, and free radial-OM grafts 3. Chronic occlusion of the SVG-PDA and SVG-PLA, with the distal RCA receiving late antegrade filling and faint collaterals from the left coronary 4. Elevated LVEDP (21 mmHg), preserved cardiac output, and normal R heart pressures   Ongoing medical therapy is recommended.     Recent Labs: 11/23/2020: ALT 5; Hemoglobin 13.9; Platelets 172; TSH 2.320 04/18/2021: BUN 22; Creatinine, Ser 0.83; Potassium 4.0; Sodium 140  11/23/2020: Chol/HDL Ratio 3.0; Cholesterol, Total 135; HDL 45; LDL Chol Calc (NIH) 69; Triglycerides 114   Estimated Creatinine Clearance: 77.4 mL/min (by C-G formula based on SCr of 0.83 mg/dL).   Wt Readings from Last 3 Encounters:  04/18/21 161 lb (73 kg)  02/14/21 147 lb (66.7 kg)  01/10/21 137 lb (62.1 kg)     Other studies reviewed: Additional studies/records reviewed today include: summarized above  ASSESSMENT  AND PLAN:  CRT-D Intact function No programming changes made  ICM Chronic CHF (systolic) 93.9 effective CRT pacing % He has edema Discussed low salt options at the deli, try to reduce sodium intake Lungs are clear Optivol is way up but has started to level off and trend down Start lasix 20mg  daily Discussed if edema does not improve to let know BMET today and at his visit with dr. [place]  CAD No anginal sounding symptoms On ASA, BB Self stopped statin He sees Dr. Dulce Sellar in a couple weeks, will defer to their conversation    Disposition: F/u with remotes as usual, Dr. Dulce Sellar as scheduled, EP otherwise in 1 year, sooner if needed  Current medicines are reviewed at length with the patient today.  The patient did not have any concerns regarding medicines.  Dulce Sellar, PA-C 04/18/2021 6:35 PM     [facility] [address] [place] [phone] (office)  [phone] (fax)

## 2021-04-18 ENCOUNTER — Ambulatory Visit (INDEPENDENT_AMBULATORY_CARE_PROVIDER_SITE_OTHER): Payer: Medicare Other | Admitting: Physician Assistant

## 2021-04-18 ENCOUNTER — Encounter: Payer: Self-pay | Admitting: Physician Assistant

## 2021-04-18 ENCOUNTER — Telehealth: Payer: Self-pay

## 2021-04-18 ENCOUNTER — Other Ambulatory Visit: Payer: Self-pay

## 2021-04-18 VITALS — BP 120/68 | HR 87 | Ht 67.0 in | Wt 161.0 lb

## 2021-04-18 DIAGNOSIS — I2510 Atherosclerotic heart disease of native coronary artery without angina pectoris: Secondary | ICD-10-CM | POA: Diagnosis not present

## 2021-04-18 DIAGNOSIS — I442 Atrioventricular block, complete: Secondary | ICD-10-CM | POA: Diagnosis not present

## 2021-04-18 DIAGNOSIS — I255 Ischemic cardiomyopathy: Secondary | ICD-10-CM

## 2021-04-18 DIAGNOSIS — Z79899 Other long term (current) drug therapy: Secondary | ICD-10-CM | POA: Diagnosis not present

## 2021-04-18 DIAGNOSIS — I5023 Acute on chronic systolic (congestive) heart failure: Secondary | ICD-10-CM | POA: Diagnosis not present

## 2021-04-18 DIAGNOSIS — Z95810 Presence of automatic (implantable) cardiac defibrillator: Secondary | ICD-10-CM

## 2021-04-18 LAB — CUP PACEART INCLINIC DEVICE CHECK
Battery Remaining Longevity: 56 mo
Battery Voltage: 2.96 V
Brady Statistic AP VP Percent: 0.4 %
Brady Statistic AP VS Percent: 0.01 %
Brady Statistic AS VP Percent: 95.67 %
Brady Statistic AS VS Percent: 3.93 %
Brady Statistic RA Percent Paced: 0.41 %
Brady Statistic RV Percent Paced: 95.28 %
Date Time Interrogation Session: 20221020191632
HighPow Impedance: 43 Ohm
Implantable Lead Implant Date: 20111213
Implantable Lead Implant Date: 20191230
Implantable Lead Implant Date: 20191230
Implantable Lead Location: 753858
Implantable Lead Location: 753859
Implantable Lead Location: 753860
Implantable Lead Model: 5076
Implantable Pulse Generator Implant Date: 20191230
Lead Channel Impedance Value: 184.154
Lead Channel Impedance Value: 184.154
Lead Channel Impedance Value: 202.091
Lead Channel Impedance Value: 220.723
Lead Channel Impedance Value: 220.723
Lead Channel Impedance Value: 285 Ohm
Lead Channel Impedance Value: 342 Ohm
Lead Channel Impedance Value: 380 Ohm
Lead Channel Impedance Value: 399 Ohm
Lead Channel Impedance Value: 399 Ohm
Lead Channel Impedance Value: 399 Ohm
Lead Channel Impedance Value: 494 Ohm
Lead Channel Impedance Value: 665 Ohm
Lead Channel Impedance Value: 665 Ohm
Lead Channel Impedance Value: 722 Ohm
Lead Channel Impedance Value: 817 Ohm
Lead Channel Impedance Value: 836 Ohm
Lead Channel Impedance Value: 836 Ohm
Lead Channel Pacing Threshold Amplitude: 0.5 V
Lead Channel Pacing Threshold Amplitude: 0.875 V
Lead Channel Pacing Threshold Amplitude: 1.125 V
Lead Channel Pacing Threshold Pulse Width: 0.4 ms
Lead Channel Pacing Threshold Pulse Width: 0.4 ms
Lead Channel Pacing Threshold Pulse Width: 0.4 ms
Lead Channel Sensing Intrinsic Amplitude: 2.25 mV
Lead Channel Sensing Intrinsic Amplitude: 2.375 mV
Lead Channel Sensing Intrinsic Amplitude: 26 mV
Lead Channel Sensing Intrinsic Amplitude: 26 mV
Lead Channel Setting Pacing Amplitude: 1.75 V
Lead Channel Setting Pacing Amplitude: 1.75 V
Lead Channel Setting Pacing Amplitude: 2 V
Lead Channel Setting Pacing Pulse Width: 0.4 ms
Lead Channel Setting Pacing Pulse Width: 0.4 ms
Lead Channel Setting Sensing Sensitivity: 0.45 mV

## 2021-04-18 LAB — BASIC METABOLIC PANEL
BUN/Creatinine Ratio: 27 — ABNORMAL HIGH (ref 10–24)
BUN: 22 mg/dL (ref 8–27)
CO2: 26 mmol/L (ref 20–29)
Calcium: 8.7 mg/dL (ref 8.6–10.2)
Chloride: 101 mmol/L (ref 96–106)
Creatinine, Ser: 0.83 mg/dL (ref 0.76–1.27)
Glucose: 116 mg/dL — ABNORMAL HIGH (ref 70–99)
Potassium: 4 mmol/L (ref 3.5–5.2)
Sodium: 140 mmol/L (ref 134–144)
eGFR: 94 mL/min/{1.73_m2} (ref >59)

## 2021-04-18 MED ORDER — FUROSEMIDE 20 MG PO TABS: ORAL_TABLET | ORAL | 1 refills | Status: DC

## 2021-04-18 NOTE — Patient Instructions (Addendum)
Medication Instructions:   START TAKING LASIX 20 MG ONCE A DAY   *If you need a refill on your cardiac medications before your next appointment, please call your pharmacy*   Lab Work:  BMET  TODAY    AND RETURN FOR  BMET LABS IN 10 DAY ON SAME DR [facility]   If you have labs (blood work) drawn today and your tests are completely normal, you will receive your results only by: MyChart Message (if you have MyChart) OR A paper copy in the mail If you have any lab test that is abnormal or we need to change your treatment, we will call you to review the results.   Testing/Procedures: NONE ORDERED  TODAY    Follow-Up: At [facility], you and your health needs are our priority.  As part of our continuing mission to provide you with exceptional heart care, we have created designated Provider Care Teams.  These Care Teams include your primary Cardiologist (physician) and Advanced Practice Providers (APPs -  Physician Assistants and Nurse Practitioners) who all work together to provide you with the care you need, when you need it.  We recommend signing up for the patient portal called "MyChart".  Sign up information is provided on this After Visit Summary.  MyChart is used to connect with patients for Virtual Visits (Telemedicine).  Patients are able to view lab/test results, encounter notes, upcoming appointments, etc.  Non-urgent messages can be sent to your provider as well.   To learn more about what you can do with MyChart, go to [email or web address].    Your next appointment:   1 year(s)  The format for your next appointment:   In Person  Provider:   Sherryl Manges, MD   Other Instructions

## 2021-04-18 NOTE — Progress Notes (Signed)
Remote ICD transmission.   

## 2021-04-18 NOTE — Chronic Care Management (AMB) (Signed)
    Chronic Care Management Pharmacy Assistant   Name: Jeffrey Parsons  MRN: [id number] DOB: Jan 07, 1951  Reason for Encounter: Patient Assistance Coordination  04/18/2021- Patient assistance application updated for 2023 renewal. Mailing application to patient to fill out and sign, patient will need updated income information before returning to PCP office for Dr. Marina Goodell to sign and fax.    Medications: Outpatient Encounter Medications as of 04/18/2021  Medication Sig Note   acetaminophen (TYLENOL) 500 MG tablet Take 500 mg by mouth daily as needed for moderate pain or headache.     albuterol (VENTOLIN HFA) 108 (90 Base) MCG/ACT inhaler Inhale 2 puffs into the lungs every 6 (six) hours as needed for wheezing or shortness of breath.    aspirin EC 81 MG tablet Take 1 tablet (81 mg total) by mouth daily.    carbidopa-levodopa (SINEMET CR) 50-200 MG tablet Take 1 tablet by mouth every evening.     carbidopa-levodopa (SINEMET IR) 25-250 MG tablet Take 1 tablet by mouth 4 (four) times daily.     carvedilol (COREG) 12.5 MG tablet Take 1 tablet (12.5 mg total) by mouth 2 (two) times daily.    Continuous Blood Gluc Sensor (FREESTYLE LIBRE 2 SENSOR) MISC by Does not apply route.    furosemide (LASIX) 20 MG tablet Take 1 tablet by mouth daily    insulin glargine (LANTUS) 100 UNIT/ML Solostar Pen Inject 18 Units into the skin daily. 02/27/2021: Resumed 18 units when stopping Farxiga   Insulin Pen Needle (PEN NEEDLES 5/16") 30G X 8 MM MISC by Does not apply route.    metFORMIN (GLUCOPHAGE) 500 MG tablet TAKE 2 TABLETS (1,000 MG TOTAL) 2 (TWO) TIMES DAILY WITH A MEAL. START THIS WITH NEW INSULIN    Multiple Vitamin (MULTIVITAMIN) tablet Take 1 tablet by mouth daily.    rosuvastatin (CRESTOR) 10 MG tablet TAKE 1 TABLET BY MOUTH EVERY DAY (Patient not taking: Reported on 04/18/2021)    sacubitril-valsartan (ENTRESTO) 49-51 MG Take 1 tablet by mouth 2 (two) times daily.    No facility-administered encounter  medications on file as of 04/18/2021.   Billee Cashing, CMA Clinical Pharmacist Assistant [phone]

## 2021-04-19 ENCOUNTER — Other Ambulatory Visit: Payer: Self-pay | Admitting: *Deleted

## 2021-04-19 DIAGNOSIS — R350 Frequency of micturition: Secondary | ICD-10-CM | POA: Diagnosis not present

## 2021-04-19 DIAGNOSIS — Z79899 Other long term (current) drug therapy: Secondary | ICD-10-CM

## 2021-04-19 DIAGNOSIS — G250 Essential tremor: Secondary | ICD-10-CM | POA: Diagnosis not present

## 2021-04-19 DIAGNOSIS — G20 Parkinson's disease: Secondary | ICD-10-CM | POA: Diagnosis not present

## 2021-04-19 DIAGNOSIS — R351 Nocturia: Secondary | ICD-10-CM | POA: Diagnosis not present

## 2021-04-19 MED ORDER — POTASSIUM CHLORIDE ER 10 MEQ PO TBCR: 10.0000 meq | EXTENDED_RELEASE_TABLET | Freq: Every day | ORAL | 1 refills | Status: DC

## 2021-04-22 NOTE — Addendum Note (Signed)
Addended by: Bernell List on: 04/22/2021 02:06 PM   Modules accepted: Orders

## 2021-04-25 DIAGNOSIS — R351 Nocturia: Secondary | ICD-10-CM | POA: Diagnosis not present

## 2021-04-25 DIAGNOSIS — N401 Enlarged prostate with lower urinary tract symptoms: Secondary | ICD-10-CM | POA: Diagnosis not present

## 2021-04-25 DIAGNOSIS — R339 Retention of urine, unspecified: Secondary | ICD-10-CM | POA: Diagnosis not present

## 2021-04-29 DIAGNOSIS — E1169 Type 2 diabetes mellitus with other specified complication: Secondary | ICD-10-CM | POA: Diagnosis not present

## 2021-04-29 DIAGNOSIS — E782 Mixed hyperlipidemia: Secondary | ICD-10-CM

## 2021-04-29 DIAGNOSIS — E119 Type 2 diabetes mellitus without complications: Secondary | ICD-10-CM

## 2021-04-30 ENCOUNTER — Other Ambulatory Visit: Payer: Self-pay

## 2021-05-01 ENCOUNTER — Other Ambulatory Visit: Payer: Self-pay

## 2021-05-01 ENCOUNTER — Encounter: Payer: Self-pay | Admitting: Cardiology

## 2021-05-01 ENCOUNTER — Ambulatory Visit (INDEPENDENT_AMBULATORY_CARE_PROVIDER_SITE_OTHER): Payer: Medicare Other | Admitting: Cardiology

## 2021-05-01 VITALS — BP 116/56 | HR 90 | Ht 66.6 in | Wt 157.6 lb

## 2021-05-01 DIAGNOSIS — I255 Ischemic cardiomyopathy: Secondary | ICD-10-CM | POA: Diagnosis not present

## 2021-05-01 DIAGNOSIS — I110 Hypertensive heart disease with heart failure: Secondary | ICD-10-CM

## 2021-05-01 DIAGNOSIS — Z95810 Presence of automatic (implantable) cardiac defibrillator: Secondary | ICD-10-CM

## 2021-05-01 DIAGNOSIS — E782 Mixed hyperlipidemia: Secondary | ICD-10-CM

## 2021-05-01 DIAGNOSIS — I2510 Atherosclerotic heart disease of native coronary artery without angina pectoris: Secondary | ICD-10-CM | POA: Diagnosis not present

## 2021-05-01 DIAGNOSIS — I442 Atrioventricular block, complete: Secondary | ICD-10-CM | POA: Diagnosis not present

## 2021-05-01 NOTE — Progress Notes (Signed)
Cardiology Office Note:    Date:  05/01/2021   ID:  Jeffrey Parsons, DOB 04/16/1951, MRN [id number]  PCP:  Abigail Miyamoto, MD  Cardiologist:  Norman Herrlich, MD    Referring MD: Abigail Miyamoto,*    ASSESSMENT:    1. Coronary artery disease involving native coronary artery of native heart without angina pectoris   2. Ischemic cardiomyopathy   3. Complete heart block (HCC)   4. Biventricular implantable cardioverter-defibrillator in situ   5. Hypertensive heart disease with heart failure (HCC)   6. Mixed hyperlipidemia    PLAN:    In order of problems listed above:  [name] is doing well with his CAD after remote CABG having no anginal discomfort New [place] Heart Association class I we will continue treatment including aspirin beta-blocker and his high intensity statin Stable he had mild heart failure cleared with a low dose of loop diuretic recheck renal function proBNP and continue his treatment he is having good regimen of maximally tolerated Entresto carvedilol intolerant of SGLT2 inhibitor and I will look at his renal function decide about starting MRA.  He has biventricular pacemaker. Stable heart block pacemaker followed by device clinic Continue his current statin LDL is at target   Next appointment: 4 months   Medication Adjustments/Labs and Tests Ordered: Current medicines are reviewed at length with the patient today.  Concerns regarding medicines are outlined above.  No orders of the defined types were placed in this encounter.  No orders of the defined types were placed in this encounter.   Chief Complaint  Patient presents with   Follow-up   Cardiomyopathy     History of Present Illness:    Jeffrey Parsons is a 70 y.o. male with a hx of CAD with CABG age 41 in 2002 type 2 diabetes mellitus hyperlipidemia sick sinus syndrome with permanent pacemaker CRT/D and Parkinson's disease.  He was last seen 10/29/2020.  He was noted to have  cardiomyopathy and ejection fraction December 2021 severely reduced 30 to 35% and was on good guideline directed therapy including beta-blocker and Entresto and follows with EP for pacemaker.  He was not initiated on MRA or SGLT2 inhibitor with borderline blood pressure last visit  Compliance with diet, lifestyle and medications: Yes  He received an alert for heart failure and was put on furosemide he said in retrospect he was low short of breath and edema has cleared and he asked me to check his kidney function today He was placed on SGLT2 inhibitor and he had bowel dysfunction urinary frequency was stopped Overall he is pleased with the quality of his life he is not having edema shortness of breath chest pain palpitation or syncope and tolerates his cardiac medications without orthostatic drop in blood pressure that can be seen with Parkinson's.  His last remote pacemaker check 04/18/2021 showed to be biventricular paced 96% of the time projected battery life 56 months with normal parameters Past Medical History:  Diagnosis Date   AICD (automatic cardioverter/defibrillator) present    Complete heart block (HCC) 05/26/2017   Coronary artery disease involving native coronary artery of native heart without angina pectoris 09/12/2016   Ischemic cardiomyopathy 05/26/2017   Pneumonia 2019   Retinopathy     Past Surgical History:  Procedure Laterality Date   BI-VENTRICULAR IMPLANTABLE CARDIOVERTER DEFIBRILLATOR UPGRADE  06/25/2018   BIV UPGRADE N/A 06/25/2018   Procedure: BIV ICD UPGRADE;  Surgeon: Duke Salvia, MD;  Location: [facility] INVASIVE CV LAB;  Service: Cardiovascular;  Laterality: N/A;   CARDIAC CATHETERIZATION  2001; ~ 2013   CATARACT EXTRACTION W/ INTRAOCULAR LENS  IMPLANT, BILATERAL Bilateral    CORONARY ARTERY BYPASS GRAFT  2001   "CABG X5"   INSERT / REPLACE / REMOVE PACEMAKER  05/2010   KNEE SURGERY Left    AS A CHILD   R shoulder surgery  05/2020   RIGHT/LEFT HEART CATH AND  CORONARY/GRAFT ANGIOGRAPHY N/A 06/16/2017   Procedure: RIGHT/LEFT HEART CATH AND CORONARY/GRAFT ANGIOGRAPHY;  Surgeon: Tonny Bollman, MD;  Location: [facility] INVASIVE CV LAB;  Service: Cardiovascular;  Laterality: N/A;   TONSILLECTOMY      Current Medications: Current Meds  Medication Sig   acetaminophen (TYLENOL) 500 MG tablet Take 500 mg by mouth daily as needed for moderate pain or headache.    albuterol (VENTOLIN HFA) 108 (90 Base) MCG/ACT inhaler Inhale 2 puffs into the lungs every 6 (six) hours as needed for wheezing or shortness of breath.   aspirin EC 81 MG tablet Take 1 tablet (81 mg total) by mouth daily.   carbidopa-levodopa (SINEMET CR) 50-200 MG tablet Take 1 tablet by mouth every evening.    carbidopa-levodopa (SINEMET IR) 25-250 MG tablet Take 1 tablet by mouth 4 (four) times daily.    carvedilol (COREG) 12.5 MG tablet Take 1 tablet (12.5 mg total) by mouth 2 (two) times daily.   furosemide (LASIX) 20 MG tablet Take 1 tablet by mouth daily   insulin glargine (LANTUS) 100 UNIT/ML Solostar Pen Inject 18 Units into the skin daily.   metFORMIN (GLUCOPHAGE) 500 MG tablet Take 1,000 mg by mouth 2 (two) times daily with a meal.   Multiple Vitamin (MULTIVITAMIN) tablet Take 1 tablet by mouth daily.   potassium chloride (KLOR-CON) 10 MEQ tablet Take 1 tablet (10 mEq total) by mouth daily.   rosuvastatin (CRESTOR) 10 MG tablet TAKE 1 TABLET BY MOUTH EVERY DAY   sacubitril-valsartan (ENTRESTO) 49-51 MG Take 1 tablet by mouth 2 (two) times daily.     Allergies:   Farxiga [dapagliflozin], Onion, and Sulfa antibiotics   Social History   Socioeconomic History   Marital status: Married    Spouse name: Not on file   Number of children: Not on file   Years of education: Not on file   Highest education level: Not on file  Occupational History   Not on file  Tobacco Use   Smoking status: Never   Smokeless tobacco: Never  Vaping Use   Vaping Use: Never used  Substance and Sexual  Activity   Alcohol use: Never   Drug use: Never   Sexual activity: Yes  Other Topics Concern   Not on file  Social History Narrative   Not on file   Social Determinants of Health   Financial Resource Strain: Not on file  Food Insecurity: Not on file  Transportation Needs: Not on file  Physical Activity: Not on file  Stress: Not on file  Social Connections: Socially Integrated   Frequency of Communication with Friends and Family: More than three times a week   Frequency of Social Gatherings with Friends and Family: Twice a week   Attends Religious Services: More than 4 times per year   Active Member of [employer] or Organizations: Yes   Attends Engineer, structural: More than 4 times per year   Marital Status: Married     Family History: The patient's family history includes Bronchitis in his mother; Coronary artery disease in his father; Heart disease in his father; Hypertension  in his brother and sister. ROS:   Please see the history of present illness.    All other systems reviewed and are negative.  EKGs/Labs/Other Studies Reviewed:    The following studies were reviewed today:  EKG:  EKG performed 04/18/2021 shows paced rhythm with QRS morphology is not typical of a biventricular pacemaker  Recent Labs: 11/23/2020: ALT 5; Hemoglobin 13.9; Platelets 172; TSH 2.320 04/18/2021: BUN 22; Creatinine, Ser 0.83; Potassium 4.0; Sodium 140  Recent Lipid Panel    Component Value Date/Time   CHOL 135 11/23/2020 0930   TRIG 114 11/23/2020 0930   HDL 45 11/23/2020 0930   CHOLHDL 3.0 11/23/2020 0930   LDLCALC 69 11/23/2020 0930    Physical Exam:    VS:  BP (!) 116/56   Pulse 90   Ht 5' 6.6" (1.692 m)   Wt 157 lb 9.6 oz (71.5 kg)   SpO2 97%   BMI 24.98 kg/m     Wt Readings from Last 3 Encounters:  05/01/21 157 lb 9.6 oz (71.5 kg)  04/18/21 161 lb (73 kg)  02/14/21 147 lb (66.7 kg)     GEN:  Well nourished, well developed in no acute distress HEENT:  Normal NECK: No JVD; No carotid bruits LYMPHATICS: No lymphadenopathy CARDIAC: RRR, no murmurs, rubs, gallops RESPIRATORY:  Clear to auscultation without rales, wheezing or rhonchi  ABDOMEN: Soft, non-tender, non-distended MUSCULOSKELETAL:  No edema; No deformity  SKIN: Warm and dry NEUROLOGIC:  Alert and oriented x 3 PSYCHIATRIC:  Normal affect    Signed, Norman Herrlich, MD  05/01/2021 3:16 PM    [facility]

## 2021-05-01 NOTE — Patient Instructions (Signed)
Medication Instructions:  Your physician recommends that you continue on your current medications as directed. Please refer to the Current Medication list given to you today.  *If you need a refill on your cardiac medications before your next appointment, please call your pharmacy*   Lab Work: Your physician recommends that you return for lab work in: TODAY BMP, ProBNP If you have labs (blood work) drawn today and your tests are completely normal, you will receive your results only by: . MyChart Message (if you have MyChart) OR . A paper copy in the mail If you have any lab test that is abnormal or we need to change your treatment, we will call you to review the results.   Testing/Procedures: None   Follow-Up: At [facility], you and your health needs are our priority.  As part of our continuing mission to provide you with exceptional heart care, we have created designated Provider Care Teams.  These Care Teams include your primary Cardiologist (physician) and Advanced Practice Providers (APPs -  Physician Assistants and Nurse Practitioners) who all work together to provide you with the care you need, when you need it.  We recommend signing up for the patient portal called "MyChart".  Sign up information is provided on this After Visit Summary.  MyChart is used to connect with patients for Virtual Visits (Telemedicine).  Patients are able to view lab/test results, encounter notes, upcoming appointments, etc.  Non-urgent messages can be sent to your provider as well.   To learn more about what you can do with MyChart, go to [email or web address].    Your next appointment:   4 month(s)  The format for your next appointment:   In Person  Provider:   Brian Munley, MD   Other Instructions   

## 2021-05-02 ENCOUNTER — Telehealth: Payer: Self-pay

## 2021-05-02 DIAGNOSIS — I10 Essential (primary) hypertension: Secondary | ICD-10-CM

## 2021-05-02 LAB — BASIC METABOLIC PANEL
BUN/Creatinine Ratio: 24 (ref 10–24)
BUN: 18 mg/dL (ref 8–27)
CO2: 25 mmol/L (ref 20–29)
Calcium: 9 mg/dL (ref 8.6–10.2)
Chloride: 100 mmol/L (ref 96–106)
Creatinine, Ser: 0.75 mg/dL — ABNORMAL LOW (ref 0.76–1.27)
Glucose: 164 mg/dL — ABNORMAL HIGH (ref 70–99)
Potassium: 4.6 mmol/L (ref 3.5–5.2)
Sodium: 139 mmol/L (ref 134–144)
eGFR: 97 mL/min/{1.73_m2} (ref >59)

## 2021-05-02 LAB — PRO B NATRIURETIC PEPTIDE: NT-Pro BNP: 3313 pg/mL — ABNORMAL HIGH (ref 0–376)

## 2021-05-02 MED ORDER — SPIRONOLACTONE 25 MG PO TABS: 25.0000 mg | ORAL_TABLET | Freq: Every day | ORAL | 3 refills | Status: DC

## 2021-05-02 NOTE — Telephone Encounter (Signed)
Spoke with patient regarding results and recommendation.  Patient verbalizes understanding and is agreeable to plan of care. Advised patient to call back with any issues or concerns.  

## 2021-05-02 NOTE — Telephone Encounter (Signed)
-----   Message from Baldo Daub, MD sent at 05/02/2021  2:12 PM EDT ----- Good results  I think he would benefit from adding spironolactone 25 mg daily as his proBNP level a marker of heart failure remains elevated  If he is willing to start and check BMP in about 2 weeks

## 2021-05-03 ENCOUNTER — Telehealth: Payer: Self-pay | Admitting: Cardiology

## 2021-05-03 NOTE — Telephone Encounter (Signed)
Pt c/o medication issue:  1. Name of Medication: spironolactone (ALDACTONE) 25 MG tablet  2. How are you currently taking this medication (dosage and times per day)? Patient has not started taking it yet  3. Are you having a reaction (difficulty breathing--STAT)? no  4. What is your medication issue? Wife of patient wants to know if the patient is supposed to be taking potassium with this medication or not. The wife read that the two medications should not be taken together and she just wants to be careful   The patient's Urologist prescribed  Tamsulosin  but it was left off his medication list. She also wanted to know if it is safe for him to take both

## 2021-05-03 NOTE — Telephone Encounter (Signed)
Wife of patient wants to know if the patient is supposed to be taking potassium with this medication (spironolactone) or not. The wife read that the two medications should not be taken together and she just wants to be careful    The patient's Urologist prescribed  Tamsulosin  but it was left off his medication list. She also wanted to know if it is safe for him to take both    Will check with Dr.Munley.

## 2021-05-03 NOTE — Telephone Encounter (Signed)
Spoke to patient. He will hold potassium for now.

## 2021-05-12 ENCOUNTER — Other Ambulatory Visit: Payer: Self-pay | Admitting: Family Medicine

## 2021-05-12 MED ORDER — FREESTYLE LIBRE 2 SENSOR MISC: 2.0000 | 3 refills | Status: DC

## 2021-05-12 MED ORDER — INSULIN GLARGINE 100 UNIT/ML SOLOSTAR PEN: 18.0000 [IU] | PEN_INJECTOR | Freq: Every day | SUBCUTANEOUS | 3 refills | Status: DC

## 2021-05-13 ENCOUNTER — Telehealth: Payer: Self-pay

## 2021-05-13 NOTE — Chronic Care Management (AMB) (Signed)
    Chronic Care Management Pharmacy Assistant   Name: Jeffrey Parsons  MRN: [id number] DOB: August 30, 1950  Reason for Encounter: Patient Assistance Coordination  05/14/2021- Faxed [employer] application for 2023 re-enrollment for [employer].  Checking with CMA regarding fax number to DME supplier to send [facility] prescription.    Medications: Outpatient Encounter Medications as of 05/13/2021  Medication Sig   acetaminophen (TYLENOL) 500 MG tablet Take 500 mg by mouth daily as needed for moderate pain or headache.    albuterol (VENTOLIN HFA) 108 (90 Base) MCG/ACT inhaler Inhale 2 puffs into the lungs every 6 (six) hours as needed for wheezing or shortness of breath. (Patient not taking: Reported on 05/27/2021)   aspirin EC 81 MG tablet Take 1 tablet (81 mg total) by mouth daily.   carbidopa-levodopa (SINEMET CR) 50-200 MG tablet Take 1 tablet by mouth every evening.    carbidopa-levodopa (SINEMET IR) 25-250 MG tablet Take 1 tablet by mouth 4 (four) times daily.    carvedilol (COREG) 12.5 MG tablet Take 1 tablet (12.5 mg total) by mouth 2 (two) times daily.   Continuous Blood Gluc Sensor (FREESTYLE LIBRE 2 SENSOR) MISC 2 each by Does not apply route every 14 (fourteen) days.   furosemide (LASIX) 20 MG tablet Take 1 tablet by mouth daily   insulin glargine (LANTUS) 100 UNIT/ML Solostar Pen Inject 18 Units into the skin daily.   metFORMIN (GLUCOPHAGE) 500 MG tablet Take 1,000 mg by mouth 2 (two) times daily with a meal.   Multiple Vitamin (MULTIVITAMIN) tablet Take 1 tablet by mouth daily.   potassium chloride (KLOR-CON) 10 MEQ tablet Take 1 tablet (10 mEq total) by mouth daily.   rosuvastatin (CRESTOR) 10 MG tablet TAKE 1 TABLET BY MOUTH EVERY DAY   sacubitril-valsartan (ENTRESTO) 49-51 MG Take 1 tablet by mouth 2 (two) times daily.   spironolactone (ALDACTONE) 25 MG tablet Take 1 tablet (25 mg total) by mouth daily.   No facility-administered encounter medications on file as of  05/13/2021.   Billee Cashing, CMA Clinical Pharmacist Assistant [phone]

## 2021-05-27 ENCOUNTER — Telehealth: Payer: Self-pay

## 2021-05-27 ENCOUNTER — Encounter: Payer: Self-pay | Admitting: Legal Medicine

## 2021-05-27 ENCOUNTER — Ambulatory Visit (INDEPENDENT_AMBULATORY_CARE_PROVIDER_SITE_OTHER): Payer: Medicare Other | Admitting: Legal Medicine

## 2021-05-27 VITALS — BP 110/50 | HR 94 | Temp 97.6°F | Resp 18 | Ht 67.0 in | Wt 148.2 lb

## 2021-05-27 DIAGNOSIS — R051 Acute cough: Secondary | ICD-10-CM

## 2021-05-27 DIAGNOSIS — J029 Acute pharyngitis, unspecified: Secondary | ICD-10-CM

## 2021-05-27 LAB — POC COVID19 BINAXNOW: SARS Coronavirus 2 Ag: NEGATIVE

## 2021-05-27 LAB — POCT INFLUENZA A/B
Influenza A, POC: NEGATIVE
Influenza B, POC: NEGATIVE

## 2021-05-27 MED ORDER — CHERATUSSIN AC 100-10 MG/5ML PO SOLN: 5.0000 mL | Freq: Three times a day (TID) | ORAL | 0 refills | Status: DC | PRN

## 2021-05-27 MED ORDER — DOXYCYCLINE HYCLATE 100 MG PO TABS
100.0000 mg | ORAL_TABLET | Freq: Two times a day (BID) | ORAL | 0 refills | Status: DC
Start: 2021-05-27 — End: 2021-06-17

## 2021-05-27 NOTE — Chronic Care Management (AMB) (Signed)
Chronic Care Management Pharmacy Assistant   Name: Jeffrey Parsons  MRN: [id number] DOB: 1950/09/24   Reason for Encounter: Disease State call for DM    Recent office visits:  None   Recent consult visits:  114/22 Advice Only (Cardiology) Norman Herrlich MD. Instructed to hold potassium.  05/02/21 (Cardiology) Delorse Limber RN. Started Spironolactone 25 mg daily.   05/01/21 (Cardiology) Norman Herrlich MD. Seen for CAD. No med changes.   04/30/21 (Cardiology) Mayer Camel CMA. D/C Metformin 500 mg.   04/19/21 (Cardiology) Shea Evans CMA. Ordered Postassium 10 MEQ daily.   04/19/21 (Neurology) Morene Rankins MD. Seen for Parkinson's Disease. No med changes.   04/18/21 (Cardiology) Sheilah Pigeon PA-C. Seen for Medication Management. Start lasix 20 mg daily.   Hospital visits:  None   Medications: Outpatient Encounter Medications as of 05/27/2021  Medication Sig   acetaminophen (TYLENOL) 500 MG tablet Take 500 mg by mouth daily as needed for moderate pain or headache.    albuterol (VENTOLIN HFA) 108 (90 Base) MCG/ACT inhaler Inhale 2 puffs into the lungs every 6 (six) hours as needed for wheezing or shortness of breath.   aspirin EC 81 MG tablet Take 1 tablet (81 mg total) by mouth daily.   carbidopa-levodopa (SINEMET CR) 50-200 MG tablet Take 1 tablet by mouth every evening.    carbidopa-levodopa (SINEMET IR) 25-250 MG tablet Take 1 tablet by mouth 4 (four) times daily.    carvedilol (COREG) 12.5 MG tablet Take 1 tablet (12.5 mg total) by mouth 2 (two) times daily.   Continuous Blood Gluc Sensor (FREESTYLE LIBRE 2 SENSOR) MISC 2 each by Does not apply route every 14 (fourteen) days.   furosemide (LASIX) 20 MG tablet Take 1 tablet by mouth daily   insulin glargine (LANTUS) 100 UNIT/ML Solostar Pen Inject 18 Units into the skin daily.   metFORMIN (GLUCOPHAGE) 500 MG tablet Take 1,000 mg by mouth 2 (two) times daily with a meal.   Multiple Vitamin (MULTIVITAMIN)  tablet Take 1 tablet by mouth daily.   potassium chloride (KLOR-CON) 10 MEQ tablet Take 1 tablet (10 mEq total) by mouth daily.   rosuvastatin (CRESTOR) 10 MG tablet TAKE 1 TABLET BY MOUTH EVERY DAY   sacubitril-valsartan (ENTRESTO) 49-51 MG Take 1 tablet by mouth 2 (two) times daily.   spironolactone (ALDACTONE) 25 MG tablet Take 1 tablet (25 mg total) by mouth daily.   No facility-administered encounter medications on file as of 05/27/2021.   Recent Relevant Labs: Lab Results  Component Value Date/Time   HGBA1C 8.5 (H) 11/23/2020 09:30 AM   HGBA1C 7.8 (H) 08/30/2020 08:47 AM   MICROALBUR 80 01/17/2020 08:38 AM    Kidney Function Lab Results  Component Value Date/Time   CREATININE 0.75 (L) 05/01/2021 03:27 PM   CREATININE 0.83 04/18/2021 01:06 PM   GFRNONAA 76 05/03/2020 10:04 AM   GFRAA 87 05/03/2020 10:04 AM     Current antihyperglycemic regimen:  Lantus 100 units. Inject 16-18 units daily Metformin 500 mg Take 1000 mg two times day  Patient verbally confirms he is taking the above medications as directed. Yes  What recent interventions/DTPs have been made to improve glycemic control:  Pt stated no changes have been made   Have there been any recent hospitalizations or ED visits since last visit with CPP? No recent visits   Patient denies hypoglycemic symptoms, including None  Patient denies hyperglycemic symptoms, including none  How often are you checking your blood sugar? at bedtime  What are  your blood sugars ranging? 180-230 05/26/21 362 05/23/21 201 05/22/21 181 Pt stated he has been sick in the last few days and his sugars have gone up. Pt stated he is not taking any medicine for his cold but is being seen by the doctors this afternoon.   On insulin? Yes How many units:16-18 units daily   During the week, how often does your blood glucose drop below 70? Never  Are you checking your feet daily/regularly? Yes  Adherence Review: Is the patient currently on  a STATIN medication? Yes Is the patient currently on ACE/ARB medication? Yes Does the patient have >5 day gap between last estimated fill dates? No  Care Gaps: Last eye exam / Retinopathy Screening? 02/09/20 Last Annual Wellness Visit? None noted  Last Diabetic Foot Exam? 08/30/20   Star Rating Drugs:  Medication:  Last Fill: [facility] Metformin   05/04/21 90ds Rosuvastatin   04/04/21 90ds    Duwayne Heck Gerringer CMA Clinical Pharmacist Assitant [phone]

## 2021-05-27 NOTE — Progress Notes (Signed)
Established Patient Office Visit  Subjective:  Patient ID: Jeffrey Parsons, male    DOB: 10-02-1950  Age: 70 y.o. MRN: [id number]  CC:  Chief Complaint  Patient presents with   Cough   Sore Throat   chest congestion    HPI Wylie Coon presents for illness 4 days ago.wife has sore throat last week.  Any fever none.he has dry cogh  Past Medical History:  Diagnosis Date   AICD (automatic cardioverter/defibrillator) present    Complete heart block ([place]) 05/26/2017   Coronary artery disease involving native coronary artery of native heart without angina pectoris 09/12/2016   Ischemic cardiomyopathy 05/26/2017   Pneumonia 2019   Retinopathy     Past Surgical History:  Procedure Laterality Date   BI-VENTRICULAR IMPLANTABLE CARDIOVERTER DEFIBRILLATOR UPGRADE  06/25/2018   BIV UPGRADE N/A 06/25/2018   Procedure: BIV ICD UPGRADE;  Surgeon: Deboraha Sprang, MD;  Location: [facility];  Service: Cardiovascular;  Laterality: N/A;   CARDIAC CATHETERIZATION  2001; ~ 2013   CATARACT EXTRACTION W/ INTRAOCULAR LENS  IMPLANT, BILATERAL Bilateral    CORONARY ARTERY BYPASS GRAFT  2001   "CABG X5"   INSERT / REPLACE / REMOVE PACEMAKER  05/2010   KNEE SURGERY Left    AS A CHILD   R shoulder surgery  05/2020   RIGHT/LEFT HEART CATH AND CORONARY/GRAFT ANGIOGRAPHY N/A 06/16/2017   Procedure: RIGHT/LEFT HEART CATH AND CORONARY/GRAFT ANGIOGRAPHY;  Surgeon: Sherren Mocha, MD;  Location: [facility];  Service: Cardiovascular;  Laterality: N/A;   TONSILLECTOMY      Family History  Problem Relation Age of Onset   Bronchitis Mother    Coronary artery disease Father    Heart disease Father    Hypertension Sister    Hypertension Brother     Social History   Socioeconomic History   Marital status: Married    Spouse name: Not on file   Number of children: Not on file   Years of education: Not on file   Highest education level: Not on file  Occupational History   Not on  file  Tobacco Use   Smoking status: Never   Smokeless tobacco: Never  Vaping Use   Vaping Use: Never used  Substance and Sexual Activity   Alcohol use: Never   Drug use: Never   Sexual activity: Yes  Other Topics Concern   Not on file  Social History Narrative   Not on file   Social Determinants of Health   Financial Resource Strain: Not on file  Food Insecurity: Not on file  Transportation Needs: Not on file  Physical Activity: Not on file  Stress: Not on file  Social Connections: Not on file  Intimate Partner Violence: Not on file    Outpatient Medications Prior to Visit  Medication Sig Dispense Refill   acetaminophen (TYLENOL) 500 MG tablet Take 500 mg by mouth daily as needed for moderate pain or headache.      albuterol (VENTOLIN HFA) 108 (90 Base) MCG/ACT inhaler Inhale 2 puffs into the lungs every 6 (six) hours as needed for wheezing or shortness of breath. (Patient not taking: Reported on 05/27/2021)     aspirin EC 81 MG tablet Take 1 tablet (81 mg total) by mouth daily.     carbidopa-levodopa (SINEMET CR) 50-200 MG tablet Take 1 tablet by mouth every evening.      carbidopa-levodopa (SINEMET IR) 25-250 MG tablet Take 1 tablet by mouth 4 (four) times daily.  carvedilol (COREG) 12.5 MG tablet Take 1 tablet (12.5 mg total) by mouth 2 (two) times daily. 180 tablet 3   Continuous Blood Gluc Sensor (FREESTYLE LIBRE 2 SENSOR) MISC 2 each by Does not apply route every 14 (fourteen) days. 6 each 3   furosemide (LASIX) 20 MG tablet Take 1 tablet by mouth daily 90 tablet 1   insulin glargine (LANTUS) 100 UNIT/ML Solostar Pen Inject 18 Units into the skin daily. 18 mL 3   metFORMIN (GLUCOPHAGE) 500 MG tablet Take 1,000 mg by mouth 2 (two) times daily with a meal.     Multiple Vitamin (MULTIVITAMIN) tablet Take 1 tablet by mouth daily.     potassium chloride (KLOR-CON) 10 MEQ tablet Take 1 tablet (10 mEq total) by mouth daily. 90 tablet 1   rosuvastatin (CRESTOR) 10 MG tablet  TAKE 1 TABLET BY MOUTH EVERY DAY 90 tablet 0   sacubitril-valsartan (ENTRESTO) 49-51 MG Take 1 tablet by mouth 2 (two) times daily. 180 tablet 3   spironolactone (ALDACTONE) 25 MG tablet Take 1 tablet (25 mg total) by mouth daily. 90 tablet 3   No facility-administered medications prior to visit.    Allergies  Allergen Reactions   Farxiga [Dapagliflozin] Other (See Comments)    Patient lost weight.   Onion     Upset stomach   Sulfa Antibiotics Other (See Comments)    FLUSH     ROS Review of Systems  Constitutional:  Negative for activity change and appetite change.  HENT:  Positive for congestion.   Eyes:  Negative for visual disturbance.  Respiratory:  Positive for cough.   Cardiovascular:  Negative for chest pain, palpitations and leg swelling.  Gastrointestinal:  Negative for abdominal distention and abdominal pain.  Genitourinary:  Negative for difficulty urinating and dysuria.  Musculoskeletal:  Negative for arthralgias and back pain.  Skin: Negative.   Neurological: Negative.   Psychiatric/Behavioral: Negative.       Objective:    Physical Exam Vitals reviewed.  Constitutional:      Appearance: Normal appearance.  HENT:     Head: Normocephalic.     Right Ear: Tympanic membrane normal.     Left Ear: Tympanic membrane normal.  Eyes:     Extraocular Movements: Extraocular movements intact.     Conjunctiva/sclera: Conjunctivae normal.     Pupils: Pupils are equal, round, and reactive to light.  Cardiovascular:     Rate and Rhythm: Normal rate and regular rhythm.     Pulses: Normal pulses.     Heart sounds: Normal heart sounds. No murmur heard.   No gallop.  Pulmonary:     Effort: Pulmonary effort is normal. No respiratory distress.     Breath sounds: Normal breath sounds. No wheezing.  Abdominal:     General: Abdomen is flat. Bowel sounds are normal. There is no distension.     Palpations: Abdomen is soft.     Tenderness: There is no abdominal tenderness.   Musculoskeletal:     Cervical back: Normal range of motion.  Skin:    General: Skin is warm.     Capillary Refill: Capillary refill takes less than 2 seconds.  Neurological:     General: No focal deficit present.     Mental Status: He is alert and oriented to person, place, and time. Mental status is at baseline.  Psychiatric:        Mood and Affect: Mood normal.        Behavior: Behavior normal.  Thought Content: Thought content normal.    BP (!) 110/50   Pulse 94   Temp 97.6 F (36.4 C)   Resp 18   Ht 5' 7"  (1.702 m)   Wt 148 lb 3.2 oz (67.2 kg)   SpO2 97%   BMI 23.21 kg/m  Wt Readings from Last 3 Encounters:  05/27/21 148 lb 3.2 oz (67.2 kg)  05/01/21 157 lb 9.6 oz (71.5 kg)  04/18/21 161 lb (73 kg)     Health Maintenance Due  Topic Date Due   Pneumonia Vaccine 21+ Years old (1 - PCV) Never done   Hepatitis C Screening  Never done   TETANUS/TDAP  Never done   Zoster Vaccines- Shingrix (1 of 2) Never done   COVID-19 Vaccine (3 - Moderna risk series) 09/28/2019   INFLUENZA VACCINE  Never done   OPHTHALMOLOGY EXAM  02/08/2021   HEMOGLOBIN A1C  05/26/2021    There are no preventive care reminders to display for this patient.  Lab Results  Component Value Date   TSH 2.320 11/23/2020   Lab Results  Component Value Date   WBC 5.9 11/23/2020   HGB 13.9 11/23/2020   HCT 41.2 11/23/2020   MCV 96 11/23/2020   PLT 172 11/23/2020   Lab Results  Component Value Date   NA 139 05/01/2021   K 4.6 05/01/2021   CO2 25 05/01/2021   GLUCOSE 164 (H) 05/01/2021   BUN 18 05/01/2021   CREATININE 0.75 (L) 05/01/2021   BILITOT 1.0 11/23/2020   ALKPHOS 91 11/23/2020   AST 12 11/23/2020   ALT 5 11/23/2020   PROT 6.7 11/23/2020   ALBUMIN 4.6 11/23/2020   CALCIUM 9.0 05/01/2021   ANIONGAP 12 03/29/2020   EGFR 97 05/01/2021   Lab Results  Component Value Date   CHOL 135 11/23/2020   Lab Results  Component Value Date   HDL 45 11/23/2020   Lab Results   Component Value Date   LDLCALC 69 11/23/2020   Lab Results  Component Value Date   TRIG 114 11/23/2020   Lab Results  Component Value Date   CHOLHDL 3.0 11/23/2020   Lab Results  Component Value Date   HGBA1C 8.5 (H) 11/23/2020      Assessment & Plan:   Problem List Items Addressed This Visit   None Visit Diagnoses     Acute cough    -  Primary   Relevant Orders   POC COVID-19- negative   Influenza A/B- negative   Pharyngitis, unspecified etiology       Relevant Medications   doxycycline (VIBRA-TABS) 100 MG tablet   guaiFENesin-codeine (CHERATUSSIN AC) 100-10 MG/5ML syrup  Treat with doxycycline and cheratusin       Follow-up: Return if symptoms worsen or fail to improve.    [name], MD

## 2021-05-29 NOTE — Telephone Encounter (Signed)
Sugars still elevated but per wife, patient is feeling better. They are having issues renewing CGM. Wife told me,  [facility] has been trying to contact the office for a while. Every 6 months they need a faxed chart, A1c, and BMP sent to [phone]  Will coordinate with Marla to get that sent.  Also, patient stated his sugars are still all over the place. Haven't heard back from PCP regarding note from visit in October. Will f/u with PCP

## 2021-06-13 ENCOUNTER — Other Ambulatory Visit: Payer: Self-pay

## 2021-06-13 DIAGNOSIS — E1169 Type 2 diabetes mellitus with other specified complication: Secondary | ICD-10-CM

## 2021-06-13 DIAGNOSIS — I10 Essential (primary) hypertension: Secondary | ICD-10-CM

## 2021-06-13 DIAGNOSIS — E782 Mixed hyperlipidemia: Secondary | ICD-10-CM

## 2021-06-14 ENCOUNTER — Other Ambulatory Visit: Payer: Self-pay

## 2021-06-14 ENCOUNTER — Other Ambulatory Visit: Payer: Medicare Other

## 2021-06-14 DIAGNOSIS — I10 Essential (primary) hypertension: Secondary | ICD-10-CM | POA: Diagnosis not present

## 2021-06-14 DIAGNOSIS — E782 Mixed hyperlipidemia: Secondary | ICD-10-CM | POA: Diagnosis not present

## 2021-06-14 DIAGNOSIS — E1169 Type 2 diabetes mellitus with other specified complication: Secondary | ICD-10-CM

## 2021-06-15 ENCOUNTER — Encounter: Payer: Self-pay | Admitting: Legal Medicine

## 2021-06-15 LAB — CBC WITH DIFFERENTIAL/PLATELET
Basophils Absolute: 0.1 10*3/uL (ref 0.0–0.2)
Basos: 1 %
EOS (ABSOLUTE): 0.4 10*3/uL (ref 0.0–0.4)
Eos: 7 %
Hematocrit: 39.5 % (ref 37.5–51.0)
Hemoglobin: 13.1 g/dL (ref 13.0–17.7)
Immature Grans (Abs): 0 10*3/uL (ref 0.0–0.1)
Immature Granulocytes: 0 %
Lymphocytes Absolute: 1.2 10*3/uL (ref 0.7–3.1)
Lymphs: 20 %
MCH: 30.1 pg (ref 26.6–33.0)
MCHC: 33.2 g/dL (ref 31.5–35.7)
MCV: 91 fL (ref 79–97)
Monocytes Absolute: 0.3 10*3/uL (ref 0.1–0.9)
Monocytes: 5 %
Neutrophils Absolute: 3.9 10*3/uL (ref 1.4–7.0)
Neutrophils: 67 %
Platelets: 199 10*3/uL (ref 150–450)
RBC: 4.35 x10E6/uL (ref 4.14–5.80)
RDW: 11.4 % — ABNORMAL LOW (ref 11.6–15.4)
WBC: 5.9 10*3/uL (ref 3.4–10.8)

## 2021-06-15 LAB — COMPREHENSIVE METABOLIC PANEL
ALT: 4 IU/L (ref 0–44)
AST: 12 IU/L (ref 0–40)
Albumin/Globulin Ratio: 1.9 (ref 1.2–2.2)
Albumin: 4.2 g/dL (ref 3.8–4.8)
Alkaline Phosphatase: 135 IU/L — ABNORMAL HIGH (ref 44–121)
BUN/Creatinine Ratio: 23 (ref 10–24)
BUN: 19 mg/dL (ref 8–27)
Bilirubin Total: 0.8 mg/dL (ref 0.0–1.2)
CO2: 25 mmol/L (ref 20–29)
Calcium: 9.2 mg/dL (ref 8.6–10.2)
Chloride: 98 mmol/L (ref 96–106)
Creatinine, Ser: 0.83 mg/dL (ref 0.76–1.27)
Globulin, Total: 2.2 g/dL (ref 1.5–4.5)
Glucose: 203 mg/dL — ABNORMAL HIGH (ref 70–99)
Potassium: 4.8 mmol/L (ref 3.5–5.2)
Sodium: 138 mmol/L (ref 134–144)
Total Protein: 6.4 g/dL (ref 6.0–8.5)
eGFR: 94 mL/min/{1.73_m2} (ref >59)

## 2021-06-15 LAB — HEMOGLOBIN A1C
Est. average glucose Bld gHb Est-mCnc: 200 mg/dL
Hgb A1c MFr Bld: 8.6 % — ABNORMAL HIGH (ref 4.8–5.6)

## 2021-06-15 LAB — CARDIOVASCULAR RISK ASSESSMENT

## 2021-06-15 LAB — LIPID PANEL
Chol/HDL Ratio: 3.1 ratio (ref 0.0–5.0)
Cholesterol, Total: 150 mg/dL (ref 100–199)
HDL: 48 mg/dL (ref >39)
LDL Chol Calc (NIH): 86 mg/dL (ref 0–99)
Triglycerides: 87 mg/dL (ref 0–149)
VLDL Cholesterol Cal: 16 mg/dL (ref 5–40)

## 2021-06-15 NOTE — Progress Notes (Signed)
Subjective:  Patient ID: Jeffrey Parsons, male    DOB: 06-03-1951  Age: 70 y.o. MRN: [id number]  Chief Complaint  Patient presents with   Diabetes   Hypertension   Hyperlipidemia    HPI: chronic visit  Patient present with type 2 diabetes.  Specifically, this is type 2, insulin requiring diabetes, complicated by hypertension, hypercholesterol.  Compliance with treatment has been good; patient take medicines as directed, maintains diet and exercise regimen, follows up as directed, and is keeping glucose diary.  Date of  diagnosis 2010.  Depression screen has been performed.Tobacco screen nonsmoker. Current medicines for diabetes lantus 18 units, metformin.  Patient is on valsartan for renal protection and crestor for cholesterol control.  Patient performs foot exams daily and last ophthalmologic exam was got eye exam.   Patient presents with HFPEF  that is stable. Diagnosis made 2020.  The course of the disease is stable.  Current medicines include entresto, spironolactone, coreg. Patient follows a low cholesterol diet and maintains a weight diary.  Patient is on low salt, low cholesterol diet and avoids alcohol.  Patient denies adverse effects of medicines. Patient is monitoring weight and has los of weight.  Patient is having no pedal edema, no PND and no PND.  Patient is continuing to see cardiology.   Parkinson on sinemet- stable   Current Outpatient Medications on File Prior to Visit  Medication Sig Dispense Refill   acetaminophen (TYLENOL) 500 MG tablet Take 500 mg by mouth daily as needed for moderate pain or headache.      aspirin EC 81 MG tablet Take 1 tablet (81 mg total) by mouth daily.     carbidopa-levodopa (SINEMET CR) 50-200 MG tablet Take 1 tablet by mouth every evening.      carbidopa-levodopa (SINEMET IR) 25-250 MG tablet Take 1 tablet by mouth 4 (four) times daily.      carvedilol (COREG) 12.5 MG tablet Take 1 tablet (12.5 mg total) by mouth 2 (two) times daily. 180  tablet 3   Continuous Blood Gluc Sensor (FREESTYLE LIBRE 2 SENSOR) MISC 2 each by Does not apply route every 14 (fourteen) days. 6 each 3   furosemide (LASIX) 20 MG tablet Take 1 tablet by mouth daily 90 tablet 1   insulin glargine (LANTUS) 100 UNIT/ML Solostar Pen Inject 18 Units into the skin daily. 18 mL 3   metFORMIN (GLUCOPHAGE) 500 MG tablet Take 1,000 mg by mouth 2 (two) times daily with a meal.     Multiple Vitamin (MULTIVITAMIN) tablet Take 1 tablet by mouth daily.     potassium chloride (KLOR-CON) 10 MEQ tablet Take 1 tablet (10 mEq total) by mouth daily. 90 tablet 1   rosuvastatin (CRESTOR) 10 MG tablet TAKE 1 TABLET BY MOUTH EVERY DAY 90 tablet 0   sacubitril-valsartan (ENTRESTO) 49-51 MG Take 1 tablet by mouth 2 (two) times daily. 180 tablet 3   Semaglutide 3 MG TABS Take by mouth.     spironolactone (ALDACTONE) 25 MG tablet Take 1 tablet (25 mg total) by mouth daily. 90 tablet 3   No current facility-administered medications on file prior to visit.   Past Medical History:  Diagnosis Date   AICD (automatic cardioverter/defibrillator) present    Allergy to sulfa drugs 12/04/2016   Complete heart block ([place]) 05/26/2017   Coronary artery disease involving native coronary artery of native heart without angina pectoris 09/12/2016   Ischemic cardiomyopathy 05/26/2017   Pneumonia 2019   Retinopathy    Past Surgical History:  Procedure Laterality Date   BI-VENTRICULAR IMPLANTABLE CARDIOVERTER DEFIBRILLATOR UPGRADE  06/25/2018   BIV UPGRADE N/A 06/25/2018   Procedure: BIV ICD UPGRADE;  Surgeon: Duke Salvia, MD;  Location: [facility] INVASIVE CV LAB;  Service: Cardiovascular;  Laterality: N/A;   CARDIAC CATHETERIZATION  2001; ~ 2013   CATARACT EXTRACTION W/ INTRAOCULAR LENS  IMPLANT, BILATERAL Bilateral    CORONARY ARTERY BYPASS GRAFT  2001   "CABG X5"   INSERT / REPLACE / REMOVE PACEMAKER  05/2010   KNEE SURGERY Left    AS A CHILD   R shoulder surgery  05/2020   RIGHT/LEFT HEART  CATH AND CORONARY/GRAFT ANGIOGRAPHY N/A 06/16/2017   Procedure: RIGHT/LEFT HEART CATH AND CORONARY/GRAFT ANGIOGRAPHY;  Surgeon: Tonny Bollman, MD;  Location: [facility] INVASIVE CV LAB;  Service: Cardiovascular;  Laterality: N/A;   TONSILLECTOMY      Family History  Problem Relation Age of Onset   Bronchitis Mother    Coronary artery disease Father    Heart disease Father    Hypertension Sister    Hypertension Brother    Social History   Socioeconomic History   Marital status: Married    Spouse name: Not on file   Number of children: Not on file   Years of education: Not on file   Highest education level: Not on file  Occupational History   Not on file  Tobacco Use   Smoking status: Never   Smokeless tobacco: Never  Vaping Use   Vaping Use: Never used  Substance and Sexual Activity   Alcohol use: Never   Drug use: Never   Sexual activity: Yes  Other Topics Concern   Not on file  Social History Narrative   Not on file   Social Determinants of Health   Financial Resource Strain: Not on file  Food Insecurity: Not on file  Transportation Needs: Not on file  Physical Activity: Not on file  Stress: Not on file  Social Connections: Not on file    Review of Systems  Constitutional:  Negative for chills, fatigue, fever and unexpected weight change.  HENT:  Negative for congestion, ear pain, sinus pain and sore throat.   Eyes:  Negative for visual disturbance.  Respiratory:  Negative for cough and shortness of breath.   Cardiovascular:  Negative for chest pain and palpitations.  Gastrointestinal:  Negative for abdominal pain, blood in stool, constipation, diarrhea, nausea and vomiting.  Endocrine: Negative for polydipsia.  Genitourinary:  Negative for dysuria.  Musculoskeletal:  Negative for back pain.  Skin:  Negative for rash.  Neurological:  Negative for headaches.  Psychiatric/Behavioral: Negative.      Objective:  BP (!) 90/50    Pulse 90    Temp (!) 97.2 F (36.2  C)    Resp 16    Ht 5\' 7"  (1.702 m)    Wt 146 lb (66.2 kg)    SpO2 95%    BMI 22.87 kg/m   BP/Weight 06/17/2021 05/27/2021 05/01/2021  Systolic BP 90 110 116  Diastolic BP 50 50 56  Wt. (Lbs) 146 148.2 157.6  BMI 22.87 23.21 24.98    Physical Exam Vitals reviewed.  Constitutional:      General: He is not in acute distress.    Appearance: Normal appearance.  HENT:     Right Ear: Tympanic membrane, ear canal and external ear normal.     Left Ear: Tympanic membrane and ear canal normal.     Nose: Nose normal.     Mouth/Throat:  Mouth: Mucous membranes are moist.     Pharynx: Oropharynx is clear.  Eyes:     Extraocular Movements: Extraocular movements intact.     Conjunctiva/sclera: Conjunctivae normal.     Pupils: Pupils are equal, round, and reactive to light.  Cardiovascular:     Rate and Rhythm: Normal rate and regular rhythm.     Pulses: Normal pulses.     Heart sounds: Normal heart sounds. No murmur heard.   No gallop.  Pulmonary:     Effort: Pulmonary effort is normal. No respiratory distress.     Breath sounds: No wheezing.  Abdominal:     General: Abdomen is flat. Bowel sounds are normal. There is no distension.     Palpations: Abdomen is soft.     Tenderness: There is no abdominal tenderness.  Musculoskeletal:     Cervical back: Normal range of motion and neck supple.  Neurological:     Mental Status: He is alert.    Diabetic Foot Exam - Simple   Simple Foot Form Diabetic Foot exam was performed with the following findings: Yes 06/17/2021  9:18 AM  Visual Inspection Sensation Testing Intact to touch and monofilament testing bilaterally: Yes Pulse Check Posterior Tibialis and Dorsalis pulse intact bilaterally: Yes Comments Bunion and hammer toes,       Lab Results  Component Value Date   WBC 5.9 06/14/2021   HGB 13.1 06/14/2021   HCT 39.5 06/14/2021   PLT 199 06/14/2021   GLUCOSE 203 (H) 06/14/2021   CHOL 150 06/14/2021   TRIG 87 06/14/2021    HDL 48 06/14/2021   LDLCALC 86 06/14/2021   ALT 4 06/14/2021   AST 12 06/14/2021   NA 138 06/14/2021   K 4.8 06/14/2021   CL 98 06/14/2021   CREATININE 0.83 06/14/2021   BUN 19 06/14/2021   CO2 25 06/14/2021   TSH 2.320 11/23/2020   HGBA1C 8.6 (H) 06/14/2021   MICROALBUR 80 01/17/2020      Assessment & Plan:   Problem List Items Addressed This Visit       Cardiovascular and Mediastinum   Coronary artery disease involving native coronary artery of native heart without angina pectoris An individual plan was formulated based on patient history and exam, labs and evidence based data. Patient has not had recent angina or nitroglycerin use. continue present treatment.     Chronic combined systolic and diastolic CHF (congestive heart failure) ([place]) An individualized care plan was established and reinforced.  The patient's disease status was assessed using clinical finding son exam today, labs, and/or other diagnostic testing such as x-rays, to determine the patient's success in meeting treatmentgoalsbased on disease-based guidelines and found to be improving. But not at goal yet. Medications prescriptions change Laboratory tests ordered to be performed today include routine. RECOMMENDATIONS: given include see cardiology.  Call physician is patient gains 3 lbs in one day or 5 lbs for one week.  Call for progressive PND, orthopnea or increased pedal edema.     Ischemic cardiomyopathy N chest pain of DOE    Essential hypertension An individual hypertension care plan was established and reinforced today.  The patient's status was assessed using clinical findings on exam and labs or diagnostic tests. The patient's success at meeting treatment goals on disease specific evidence-based guidelines and found to be well controlled. SELF MANAGEMENT: The patient and I together assessed ways to personally work towards obtaining the recommended goals. RECOMMENDATIONS: avoid decongestants found in  common cold remedies, decrease consumption of  alcohol, perform routine monitoring of BP with home BP cuff, exercise, reduction of dietary salt, take medicines as prescribed, try not to miss doses and quit smoking.  Regular exercise and maintaining a healthy weight is needed.  Stress reduction may help. A CLINICAL SUMMARY including written plan identify barriers to care unique to individual due to social or financial issues.  We attempt to mutually creat solutions for individual and family understanding.     Obesity, diabetes, and hypertension syndrome ([place]) - Primary   Relevant Medications   Semaglutide (RYBELSUS) 7 MG TABS   Semaglutide 3 MG TABS samples An individual care plan for diabetes was established and reinforced today.  The patient's status was assessed using clinical findings on exam, labs and diagnostic testing. Patient success at meeting goals based on disease specific evidence-based guidelines and found to be good controlled. Medications were assessed and patient's understanding of the medical issues , including barriers were assessed. Recommend adherence to a diabetic diet, a graduated exercise program, HgbA1c level is checked quarterly, and urine microalbumin performed yearly .  Annual mono-filament sensation testing performed. Lower blood pressure and control hyperlipidemia is important. Get annual eye exams and annual flu shots and smoking cessation discussed.  Self management goals were discussed.     Atherosclerosis of aorta [facility]) Patient is on crestor     Nervous and Auditory  Parkinson is under control and he is seeing neurologist 30 minute visit and review of old records    Follow-up: Return in about 4 months (around 10/16/2021) for fasting.  An After Visit Summary was printed and given to the patient.  [name], MD [facility] [phone]

## 2021-06-16 NOTE — Progress Notes (Signed)
Cbc normal, glucose 203, kidney tests normal, liver tests normal, cholesterol normal, A1c 8.6 very high- need less than 8, need to start jardiance lp

## 2021-06-17 ENCOUNTER — Encounter: Payer: Self-pay | Admitting: Legal Medicine

## 2021-06-17 ENCOUNTER — Encounter: Payer: Self-pay | Admitting: Cardiology

## 2021-06-17 ENCOUNTER — Ambulatory Visit (INDEPENDENT_AMBULATORY_CARE_PROVIDER_SITE_OTHER): Payer: Medicare Other | Admitting: Legal Medicine

## 2021-06-17 ENCOUNTER — Other Ambulatory Visit: Payer: Self-pay

## 2021-06-17 VITALS — BP 90/50 | HR 90 | Temp 97.2°F | Resp 16 | Ht 67.0 in | Wt 146.0 lb

## 2021-06-17 DIAGNOSIS — N401 Enlarged prostate with lower urinary tract symptoms: Secondary | ICD-10-CM | POA: Diagnosis not present

## 2021-06-17 DIAGNOSIS — E1169 Type 2 diabetes mellitus with other specified complication: Secondary | ICD-10-CM | POA: Diagnosis not present

## 2021-06-17 DIAGNOSIS — E669 Obesity, unspecified: Secondary | ICD-10-CM

## 2021-06-17 DIAGNOSIS — Z959 Presence of cardiac and vascular implant and graft, unspecified: Secondary | ICD-10-CM | POA: Diagnosis not present

## 2021-06-17 DIAGNOSIS — I255 Ischemic cardiomyopathy: Secondary | ICD-10-CM | POA: Diagnosis not present

## 2021-06-17 DIAGNOSIS — I700 Atherosclerosis of aorta: Secondary | ICD-10-CM

## 2021-06-17 DIAGNOSIS — N138 Other obstructive and reflux uropathy: Secondary | ICD-10-CM

## 2021-06-17 DIAGNOSIS — Z6822 Body mass index (BMI) 22.0-22.9, adult: Secondary | ICD-10-CM

## 2021-06-17 DIAGNOSIS — I5042 Chronic combined systolic (congestive) and diastolic (congestive) heart failure: Secondary | ICD-10-CM | POA: Diagnosis not present

## 2021-06-17 DIAGNOSIS — I10 Essential (primary) hypertension: Secondary | ICD-10-CM

## 2021-06-17 DIAGNOSIS — I2510 Atherosclerotic heart disease of native coronary artery without angina pectoris: Secondary | ICD-10-CM | POA: Diagnosis not present

## 2021-06-17 DIAGNOSIS — G20 Parkinson's disease: Secondary | ICD-10-CM

## 2021-06-17 DIAGNOSIS — I152 Hypertension secondary to endocrine disorders: Secondary | ICD-10-CM

## 2021-06-17 DIAGNOSIS — E782 Mixed hyperlipidemia: Secondary | ICD-10-CM

## 2021-06-17 DIAGNOSIS — E1159 Type 2 diabetes mellitus with other circulatory complications: Secondary | ICD-10-CM

## 2021-06-17 MED ORDER — RYBELSUS 7 MG PO TABS: 7.0000 mg | ORAL_TABLET | Freq: Every day | ORAL | 6 refills | Status: DC

## 2021-06-25 ENCOUNTER — Other Ambulatory Visit: Payer: Self-pay | Admitting: Cardiovascular Disease

## 2021-07-10 ENCOUNTER — Ambulatory Visit (INDEPENDENT_AMBULATORY_CARE_PROVIDER_SITE_OTHER): Payer: Medicare Other

## 2021-07-10 DIAGNOSIS — I442 Atrioventricular block, complete: Secondary | ICD-10-CM | POA: Diagnosis not present

## 2021-07-10 LAB — CUP PACEART REMOTE DEVICE CHECK
Battery Remaining Longevity: 50 mo
Battery Voltage: 2.97 V
Brady Statistic AP VP Percent: 0.23 %
Brady Statistic AP VS Percent: 0.01 %
Brady Statistic AS VP Percent: 97.23 %
Brady Statistic AS VS Percent: 2.53 %
Brady Statistic RA Percent Paced: 0.24 %
Brady Statistic RV Percent Paced: 97.19 %
Date Time Interrogation Session: 20230111012305
HighPow Impedance: 49 Ohm
Implantable Lead Implant Date: 20111213
Implantable Lead Implant Date: 20191230
Implantable Lead Implant Date: 20191230
Implantable Lead Location: 753858
Implantable Lead Location: 753859
Implantable Lead Location: 753860
Implantable Lead Model: 5076
Implantable Pulse Generator Implant Date: 20191230
Lead Channel Impedance Value: 190 Ohm
Lead Channel Impedance Value: 194.634
Lead Channel Impedance Value: 224.898
Lead Channel Impedance Value: 224.898
Lead Channel Impedance Value: 231.42 Ohm
Lead Channel Impedance Value: 285 Ohm
Lead Channel Impedance Value: 380 Ohm
Lead Channel Impedance Value: 380 Ohm
Lead Channel Impedance Value: 380 Ohm
Lead Channel Impedance Value: 399 Ohm
Lead Channel Impedance Value: 399 Ohm
Lead Channel Impedance Value: 551 Ohm
Lead Channel Impedance Value: 646 Ohm
Lead Channel Impedance Value: 646 Ohm
Lead Channel Impedance Value: 665 Ohm
Lead Channel Impedance Value: 874 Ohm
Lead Channel Impedance Value: 874 Ohm
Lead Channel Impedance Value: 893 Ohm
Lead Channel Pacing Threshold Amplitude: 0.625 V
Lead Channel Pacing Threshold Amplitude: 0.75 V
Lead Channel Pacing Threshold Amplitude: 1 V
Lead Channel Pacing Threshold Pulse Width: 0.4 ms
Lead Channel Pacing Threshold Pulse Width: 0.4 ms
Lead Channel Pacing Threshold Pulse Width: 0.4 ms
Lead Channel Sensing Intrinsic Amplitude: 2.25 mV
Lead Channel Sensing Intrinsic Amplitude: 2.25 mV
Lead Channel Sensing Intrinsic Amplitude: 26.875 mV
Lead Channel Sensing Intrinsic Amplitude: 26.875 mV
Lead Channel Setting Pacing Amplitude: 1.5 V
Lead Channel Setting Pacing Amplitude: 1.5 V
Lead Channel Setting Pacing Amplitude: 2 V
Lead Channel Setting Pacing Pulse Width: 0.4 ms
Lead Channel Setting Pacing Pulse Width: 0.4 ms
Lead Channel Setting Sensing Sensitivity: 0.45 mV

## 2021-07-11 ENCOUNTER — Telehealth: Payer: Self-pay

## 2021-07-11 NOTE — Progress Notes (Signed)
Pt confirmed appt on 1/18 with CPP.  ° °Danielle Gerringer, CMA °Clinical Pharmacist Assistant  °[phone]  °

## 2021-07-17 ENCOUNTER — Ambulatory Visit (INDEPENDENT_AMBULATORY_CARE_PROVIDER_SITE_OTHER): Payer: Medicare Other

## 2021-07-17 ENCOUNTER — Other Ambulatory Visit: Payer: Self-pay

## 2021-07-17 DIAGNOSIS — E1169 Type 2 diabetes mellitus with other specified complication: Secondary | ICD-10-CM

## 2021-07-17 DIAGNOSIS — I10 Essential (primary) hypertension: Secondary | ICD-10-CM

## 2021-07-17 DIAGNOSIS — E782 Mixed hyperlipidemia: Secondary | ICD-10-CM

## 2021-07-17 MED ORDER — CARVEDILOL 12.5 MG PO TABS: 12.5000 mg | ORAL_TABLET | Freq: Two times a day (BID) | ORAL | 3 refills | Status: DC

## 2021-07-17 NOTE — Patient Instructions (Signed)
Visit Information   Goals Addressed   None    Patient Care Plan: CCM Pharmacy Care Plan     Problem Identified: CAD, Diabetes, Hypertension   Priority: High  Onset Date: 08/07/2020     Long-Range Goal: Disease Management   Start Date: 08/07/2020  Expected End Date: 08/07/2021  Recent Progress: On track  Priority: High  Note:   Current Barriers:  Unable to achieve control of Diabetes   Pharmacist Clinical Goal(s):  Patient will achieve control of diabetes as evidenced by a1c and blood sugar readings through collaboration with PharmD and provider.   Interventions: 1:1 collaboration with [name], MD regarding development and update of comprehensive plan of care as evidenced by provider attestation and co-signature Inter-disciplinary care team collaboration (see longitudinal plan of care) Comprehensive medication review performed; medication list updated in electronic medical record  CAD: (LDL goal < 55) -Managed by Dr. Bettina Gavia The ASCVD Risk score (Arnett DK, et al., 2019) failed to calculate for the following reasons:   The patient has a prior MI or stroke diagnosis Lab Results  Component Value Date   CHOL 150 06/14/2021   CHOL 135 11/23/2020   CHOL 121 08/30/2020   Lab Results  Component Value Date   HDL 48 06/14/2021   HDL 45 11/23/2020   HDL 46 08/30/2020   Lab Results  Component Value Date   LDLCALC 86 06/14/2021   LDLCALC 69 11/23/2020   LDLCALC 61 08/30/2020   Lab Results  Component Value Date   TRIG 87 06/14/2021   TRIG 114 11/23/2020   TRIG 67 08/30/2020   Lab Results  Component Value Date   CHOLHDL 3.1 06/14/2021   CHOLHDL 3.0 11/23/2020   CHOLHDL 2.6 08/30/2020  No results found for: LDLDIRECT -Controlled -Current treatment:  rosuvastatin 10 mg daily Appropriate, Effective, Safe, Accessible -Medications previously tried: atorvastatin  -Current dietary patterns: eating healthy and trying to adhere to diabetic diet -Current exercise  habits: [employer] 5 days a week  -Educated on Cholesterol goals;  Benefits of statin for ASCVD risk reduction; Importance of limiting foods high in cholesterol; Exercise goal of 150 minutes per week; -Counseled on diet and exercise extensively Recommend continuing current medication   Diabetes (A1c goal <7.5%) Lab Results  Component Value Date   HGBA1C 8.5 (H) 11/23/2020   HGBA1C 7.8 (H) 08/30/2020   HGBA1C 8.6 (H) 05/07/2020   Lab Results  Component Value Date   MICROALBUR 80 01/17/2020   LDLCALC 69 11/23/2020   CREATININE 0.76 02/14/2021   Lab Results  Component Value Date   NA 136 02/14/2021   K 4.9 02/14/2021   CREATININE 0.76 02/14/2021   EGFR 97 02/14/2021   GFRNONAA 76 05/03/2020   GLUCOSE 266 (H) 02/14/2021   Lab Results  Component Value Date   WBC 5.9 11/23/2020   HGB 13.9 11/23/2020   HCT 41.2 11/23/2020   MCV 96 11/23/2020   PLT 172 11/23/2020  -Uncontrolled -Current medications: Lantus 16-18 SS units AM Appropriate, Query effective, Query Safe, Accessible Metformin 1000 mg bid Appropriate, Query effective, Query Safe, Accessible Freestyle Libre (PAP) Appropriate, Effective, Safe, Accessible Insulin pen needles Appropriate, Effective, Safe, Accessible Rybelsus 18m (Patient never started) Appropriate, Query effective, Query Safe, Query accessible -Medications previously tried: 70/30 insulin, Farxiga -Current home glucose readings fasting glucose:  October 2022: 64-152 mg/dL Jan 2023: 50-220 post prandial glucose: 140-225 mg/dL -Reports hypoglycemic/hyperglycemic symptoms -Current meal patterns:  breakfast: eggs lunch: trying to eat balanced meals dinner: meat and vegetables snacks:  ensure/boost, peanut butter crackers, pretzels drinks: juice to correct low blood sugar -Current exercise:  [employer] m-f when wife can drive and pt for shoulder/parkinson's -Educated on A1c and blood sugar goals; Complications of diabetes including kidney  damage, retinal damage, and cardiovascular disease; Exercise goal of 150 minutes per week; Prevention and management of hypoglycemic episodes; Continuous glucose monitoring; -Counseled to check feet daily and get yearly eye exams -Counseled on diet and exercise extensively August 2022: Recommended patient continue to watch blood sugar closely. Patient to resume Farxiga 5 mg daily with Lantus 10 mg. Patient will call pharmacist with any concerns or questions. Pharmacist sending order for Farxiga 5 mg to [facility].  October 2022: Patient stopped Farxiga and sugars are swinging too high and low. He states that with his dose of insulin in the morning he is chasing it all day and sometimes the dose is too strong and he drops too low at night. There are a few options,  1: Add Short acting insulin so he isn't running hypo multiple times during the day due to the lantus B: Replace lantus with GLP (Which has less hypoglycemia). Then taper down insulin.  Will ask PCP if option B is agreeable, if so, will start PAP ASAP because his insurance won't cover it -Also, Libre PAP needs to be renewed, he told me the company will be reaching out to [place] so we can fax his note Hx Jan 2023: Sugars are too high and too low per patient. Below 80 5x/month but sometimes is in 200's as well. Tends to range 80-150. Dr. [name] gave patient Rybelsus last month but patient never started due to fear of ADR's. Counseled extensively about risks/rewards, he agreed to try it for 2 weeks. F/U then and if he's content on therapy, will start PAP for it. Also counsled that he may have to decrease insulin after starting new med and to call us if he drops too low so we can adjust insulin  CHF -Managed by Dr. Bettina Gavia -ejection fraction December 2021 severely reduced 30 to 35% BP Readings from Last 3 Encounters:  06/17/21 (!) 90/50  05/27/21 (!) 110/50  05/01/21 (!) 116/56  -Managed by Cardio -Controlled -Current treatment  Entresto 49/51  (PAP) Appropriate, Effective, Safe, Accessible Furosemide PRN 3m Appropriate, Effective, Safe, Accessible -Medications previously tried: N/A  -Recommended to continue current medication   Patient Goals/Self-Care Activities Patient will:  - take medications as prescribed focus on medication adherence by using pill box check glucose throughout the day with [facility] document, and provide at future appointments target a minimum of 150 minutes of moderate intensity exercise weekly  Follow Up Plan: Telephone follow up appointment with care management team member scheduled for: Feb 2023  NArizona Constable Pharm.D. - [phone]       The patient verbalized understanding of instructions, educational materials, and care plan provided today and declined offer to receive copy of patient instructions, educational materials, and care plan.  The pharmacy team will reach out to the patient again over the next 30 days.   NLane Hacker RScottsdale Endoscopy Center

## 2021-07-17 NOTE — Progress Notes (Signed)
Chronic Care Management Pharmacy Note  07/17/2021 Name:  Jeffrey Parsons MRN:  [id number] DOB:  1951/04/15   Plan Summary/Recommendations:   Patient never started Rybelsus 81m, will do trial for 2 weeks. CPP will f/u in Feb, assess compliance, and will start PAP if patient is compliant with therapy Sugars below 80 about 5x/month, will let PCP know and defer to them   Subjective: [name] an 71y.o. year old male who is a primary patient of PHenrene Parsons [name].  The CCM team was consulted for assistance with disease management and care coordination needs.    Engaged with patient by telephone for follow up visit in response to provider referral for pharmacy case management and/or care coordination services.   Consent to Services:  The patient was given information about Chronic Care Management services, agreed to services, and gave verbal consent prior to initiation of services.  Please see initial visit note for detailed documentation.   Patient Care Team: PLillard Anes MD as PCP - General (Family Medicine) Nahser, PWonda Cheng MD as PCP - Cardiology (Cardiology) KDeboraha Sprang MD as PCP - Electrophysiology (Cardiology) KLane Hacker [facility](Pharmacist)  Recent office visits: 01/10/2021 - refer to urology for workup on enlarged prostate.   11/23/2020 - Dr. PHenrene Pastorordering tests to assess weight loss. Hold FPanolafor now. Hydrate and watch diet for improved blood sugar management.  [id number]- no changes. Continue Crestor.   Recent consult visits: 09/03/2020 - neuro - refer to PT for PD and balance. Reduce topiramate to 1 tab in the morning. After 2 weeks if no worsening of your tremor, please stop taking altogether. Continue to exercise. Consider lowering bp medications as much as possible. People with PD have low bp and are sensitive to low bp.    Hospital visits:   Objective:  Lab Results  Component Value Date   CREATININE 0.83 06/14/2021    BUN 19 06/14/2021   GFRNONAA 76 05/03/2020   GFRAA 87 05/03/2020   NA 138 06/14/2021   K 4.8 06/14/2021   CALCIUM 9.2 06/14/2021   CO2 25 06/14/2021    Lab Results  Component Value Date/Time   HGBA1C 8.6 (H) 06/14/2021 08:24 AM   HGBA1C 8.5 (H) 11/23/2020 09:30 AM   MICROALBUR 80 01/17/2020 08:38 AM    Last diabetic Eye exam: No results found for: HMDIABEYEEXA  Last diabetic Foot exam: No results found for: HMDIABFOOTEX   Lab Results  Component Value Date   CHOL 150 06/14/2021   HDL 48 06/14/2021   LDLCALC 86 06/14/2021   TRIG 87 06/14/2021   CHOLHDL 3.1 06/14/2021    Hepatic Function Latest Ref Rng & Units 06/14/2021 11/23/2020 08/30/2020  Total Protein 6.0 - 8.5 g/dL 6.4 6.7 6.3  Albumin 3.8 - 4.8 g/dL 4.2 4.6 4.1  AST 0 - 40 IU/L _0 ALT 0 - 44 IU/L _1 Alk Phosphatase 44 - 121 IU/L 135(H) 91 106  Total Bilirubin 0.0 - 1.2 mg/dL 0.8 1.0 0.7  Bilirubin, Direct 0.00 - 0.40 mg/dL - - -    Lab Results  Component Value Date/Time   TSH 2.320 11/23/2020 09:30 AM   TSH 1.440 05/22/2020 08:49 AM    CBC Latest Ref Rng & Units 06/14/2021 11/23/2020 08/30/2020  WBC 3.4 - 10.8 x10E3/uL 5.9 5.9 7.1  Hemoglobin 13.0 - 17.7 g/dL 13.1 13.9 13.2  Hematocrit 37.5 - 51.0 % 39.5 41.2 40.4  Platelets 150 - 450 x10E3/uL 199  172 160    No results found for: VD25OH  Clinical ASCVD: Yes  The ASCVD Risk score (Arnett DK, et al., 2019) failed to calculate for the following reasons:   The patient has a prior MI or stroke diagnosis    Depression screen [facility] 2/9 06/17/2021 01/17/2020  Decreased Interest 0 0  Down, Depressed, Hopeless 0 0  PHQ - 2 Score 0 0  Altered sleeping - 0  Tired, decreased energy - 1  Change in appetite - 0  Feeling bad or failure about yourself  - 0  Trouble concentrating - 0  Moving slowly or fidgety/restless - 1  Suicidal thoughts - 0  PHQ-9 Score - 2  Difficult doing work/chores - Not difficult at all     Other: (CHADS2VASc if Afib, MMRC or CAT  for COPD, ACT, DEXA)  Social History   Tobacco Use  Smoking Status Never  Smokeless Tobacco Never   BP Readings from Last 3 Encounters:  06/17/21 (!) 90/50  05/27/21 (!) 110/50  05/01/21 (!) 116/56   Pulse Readings from Last 3 Encounters:  06/17/21 90  05/27/21 94  05/01/21 90   Wt Readings from Last 3 Encounters:  06/17/21 146 lb (66.2 kg)  05/27/21 148 lb 3.2 oz (67.2 kg)  05/01/21 157 lb 9.6 oz (71.5 kg)    Assessment/Interventions: Review of patient past medical history, allergies, medications, health status, including review of consultants reports, laboratory and other test data, was performed as part of comprehensive evaluation and provision of chronic care management services.   SDOH:  (Social Determinants of Health) assessments and interventions performed: Yes SDOH Interventions    Flowsheet Row Most Recent Value  SDOH Interventions   Financial Strain Interventions Other (Comment)  [PAP's for multiple meds]       CCM Care Plan  Allergies  Allergen Reactions   Farxiga [Dapagliflozin] Other (See Comments)    Patient lost weight.   Onion     Upset stomach   Sulfa Antibiotics Other (See Comments)    FLUSH     Medications Reviewed Today     Reviewed by Lane Hacker, [facility] (Pharmacist) on 07/17/21 at 1446  Med List Status: <None>   Medication Order Taking? Sig Documenting Provider Last Dose Status Informant  acetaminophen (TYLENOL) 500 MG tablet [id number]  Take 500 mg by mouth daily as needed for moderate pain or headache.  [provider]  Active Multiple Informants  aspirin EC 81 MG tablet [id number]  Take 1 tablet (81 mg total) by mouth daily. Richardson Dopp T, PA-C  Active Multiple Informants  carbidopa-levodopa (SINEMET CR) 50-200 MG tablet [id number]  Take 1 tablet by mouth every evening.  [provider]  Active Multiple Informants  carbidopa-levodopa (SINEMET IR) 25-250 MG tablet [id number]  Take 1 tablet by mouth 4 (four) times  daily.  [provider]  Active Multiple Informants  carvedilol (COREG) 12.5 MG tablet [id number]  TAKE 1 TABLET BY MOUTH 2 TIMES DAILY. Richardo Priest, MD  Active   Continuous Blood Gluc Sensor (FREESTYLE LIBRE 2 SENSOR) [place] [id number]  2 each by Does not apply route every 14 (fourteen) days. Cox, Kirsten, MD  Active   furosemide (LASIX) 20 MG tablet [id number]  Take 1 tablet by mouth daily Baldwin [place], PA-C  Active   insulin glargine (LANTUS) 100 UNIT/ML Solostar Pen [id number]  Inject 18 Units into the skin daily. Cox, Kirsten, MD  Active   metFORMIN (GLUCOPHAGE) 500 MG tablet [id number]  Take 1,000 mg by  mouth 2 (two) times daily with a meal. [provider]  Active   Multiple Vitamin (MULTIVITAMIN) tablet [id number]  Take 1 tablet by mouth daily. [provider]  Active Multiple Informants  potassium chloride (KLOR-CON) 10 MEQ tablet [id number]  Take 1 tablet (10 mEq total) by mouth daily. Baldwin Jamaica, PA-C  Active   rosuvastatin (CRESTOR) 10 MG tablet [id number]  TAKE 1 TABLET BY MOUTH EVERY DAY Nahser, Wonda Cheng, MD  Active   sacubitril-valsartan (ENTRESTO) 49-51 MG [id number]  Take 1 tablet by mouth 2 (two) times daily. Nahser, Wonda Cheng, MD  Active Multiple Informants  Semaglutide (RYBELSUS) 7 MG TABS [id number]  Take 7 mg by mouth daily. [name], MD  Active   Semaglutide 3 MG TABS [id number] No Take by mouth.  Patient not taking: Reported on 07/17/2021   [provider] Not Taking Active   spironolactone (ALDACTONE) 25 MG tablet [id number]  Take 1 tablet (25 mg total) by mouth daily. Richardo Priest, MD  Active             Patient Active Problem List   Diagnosis Date Noted   Atherosclerosis of aorta ([place]) 01/10/2021   Abnormal weight loss 11/23/2020   BPH with obstruction/lower urinary tract symptoms 05/22/2020   BMI 22.0-22.9, adult 01/17/2020   Sick sinus syndrome ([place]) 09/13/2019   Obesity, diabetes, and hypertension syndrome  ([place]) 09/13/2019   Secondary dystonia 07/04/2019   Essential hypertension 06/26/2018   Complete heart block ([place]) 05/26/2017   Cardiac device in situ 05/26/2017   Chronic combined systolic and diastolic CHF (congestive heart failure) ([place]) 05/26/2017   Ischemic cardiomyopathy 05/26/2017   S/P CABG x 5 09/12/2016   Mixed hyperlipidemia 09/12/2016   Coronary artery disease involving native coronary artery of native heart without angina pectoris 09/12/2016   Pacemaker 06/06/2016   Long term (current) use of aspirin 12/06/2015   Mild obesity 12/06/2015   Other long term (current) drug therapy 12/06/2015   Mild intermittent asthma with acute exacerbation 12/07/2014   Parkinson's disease ([place]) 12/07/2014    Immunization History  Administered Date(s) Administered   Moderna Sars-Covid-2 Vaccination 08/03/2019, 08/31/2019    Conditions to be addressed/monitored:  Hyperlipidemia and Diabetes  Care Plan : [place]  Updates made by Lane Hacker, RPH since 07/17/2021 12:00 AM     Problem: CAD, Diabetes, Hypertension   Priority: High  Onset Date: 08/07/2020     Long-Range Goal: Disease Management   Start Date: 08/07/2020  Expected End Date: 08/07/2021  Recent Progress: On track  Priority: High  Note:   Current Barriers:  Unable to achieve control of Diabetes   Pharmacist Clinical Goal(s):  Patient will achieve control of diabetes as evidenced by a1c and blood sugar readings through collaboration with PharmD and provider.   Interventions: 1:1 collaboration with [name], MD regarding development and update of comprehensive plan of care as evidenced by provider attestation and co-signature Inter-disciplinary care team collaboration (see longitudinal plan of care) Comprehensive medication review performed; medication list updated in electronic medical record  CAD: (LDL goal < 55) -Managed by Dr. Bettina Gavia The ASCVD Risk score (Arnett DK, et al., 2019)  failed to calculate for the following reasons:   The patient has a prior MI or stroke diagnosis Lab Results  Component Value Date   CHOL 150 06/14/2021   CHOL 135 11/23/2020   CHOL 121 08/30/2020   Lab Results  Component Value Date   HDL 48 06/14/2021  HDL 45 11/23/2020   HDL 46 08/30/2020   Lab Results  Component Value Date   LDLCALC 86 06/14/2021   LDLCALC 69 11/23/2020   LDLCALC 61 08/30/2020   Lab Results  Component Value Date   TRIG 87 06/14/2021   TRIG 114 11/23/2020   TRIG 67 08/30/2020   Lab Results  Component Value Date   CHOLHDL 3.1 06/14/2021   CHOLHDL 3.0 11/23/2020   CHOLHDL 2.6 08/30/2020  No results found for: LDLDIRECT -Controlled -Current treatment:  rosuvastatin 10 mg daily Appropriate, Effective, Safe, Accessible -Medications previously tried: atorvastatin  -Current dietary patterns: eating healthy and trying to adhere to diabetic diet -Current exercise habits: [employer] 5 days a week  -Educated on Cholesterol goals;  Benefits of statin for ASCVD risk reduction; Importance of limiting foods high in cholesterol; Exercise goal of 150 minutes per week; -Counseled on diet and exercise extensively Recommend continuing current medication   Diabetes (A1c goal <7.5%) Lab Results  Component Value Date   HGBA1C 8.5 (H) 11/23/2020   HGBA1C 7.8 (H) 08/30/2020   HGBA1C 8.6 (H) 05/07/2020   Lab Results  Component Value Date   MICROALBUR 80 01/17/2020   LDLCALC 69 11/23/2020   CREATININE 0.76 02/14/2021   Lab Results  Component Value Date   NA 136 02/14/2021   K 4.9 02/14/2021   CREATININE 0.76 02/14/2021   EGFR 97 02/14/2021   GFRNONAA 76 05/03/2020   GLUCOSE 266 (H) 02/14/2021   Lab Results  Component Value Date   WBC 5.9 11/23/2020   HGB 13.9 11/23/2020   HCT 41.2 11/23/2020   MCV 96 11/23/2020   PLT 172 11/23/2020  -Uncontrolled -Current medications: Lantus 16-18 SS units AM Appropriate, Query effective, Query Safe,  Accessible Metformin 1000 mg bid Appropriate, Query effective, Query Safe, Accessible Freestyle Libre (PAP) Appropriate, Effective, Safe, Accessible Insulin pen needles Appropriate, Effective, Safe, Accessible Rybelsus 53m (Patient never started) Appropriate, Query effective, Query Safe, Query accessible -Medications previously tried: 70/30 insulin, Farxiga -Current home glucose readings fasting glucose:  October 2022: 64-152 mg/dL Jan 2023: 50-220 post prandial glucose: 140-225 mg/dL -Reports hypoglycemic/hyperglycemic symptoms -Current meal patterns:  breakfast: eggs lunch: trying to eat balanced meals dinner: meat and vegetables snacks: ensure/boost, peanut butter crackers, pretzels drinks: juice to correct low blood sugar -Current exercise:  [employer] m-f when wife can drive and pt for shoulder/parkinson's -Educated on A1c and blood sugar goals; Complications of diabetes including kidney damage, retinal damage, and cardiovascular disease; Exercise goal of 150 minutes per week; Prevention and management of hypoglycemic episodes; Continuous glucose monitoring; -Counseled to check feet daily and get yearly eye exams -Counseled on diet and exercise extensively August 2022: Recommended patient continue to watch blood sugar closely. Patient to resume Farxiga 5 mg daily with Lantus 10 mg. Patient will call pharmacist with any concerns or questions. Pharmacist sending order for Farxiga 5 mg to [facility].  October 2022: Patient stopped Farxiga and sugars are swinging too high and low. He states that with his dose of insulin in the morning he is chasing it all day and sometimes the dose is too strong and he drops too low at night. There are a few options,  1: Add Short acting insulin so he isn't running hypo multiple times during the day due to the lantus B: Replace lantus with GLP (Which has less hypoglycemia). Then taper down insulin.  Will ask PCP if option B is agreeable, if so, will  start PAP ASAP because his insurance won't cover it -  Also, Libre PAP needs to be renewed, he told me the company will be reaching out to [place] so we can fax his note Hx Jan 2023: Sugars are too high and too low per patient. Below 80 5x/month but sometimes is in 200's as well. Tends to range 80-150. Dr. Henrene Parsons gave patient Rybelsus last month but patient never started due to fear of ADR's. Counseled extensively about risks/rewards, he agreed to try it for 2 weeks. F/U then and if he's content on therapy, will start PAP for it. Also counsled that he may have to decrease insulin after starting new med and to call us if he drops too low so we can adjust insulin  CHF -Managed by Dr. Bettina Gavia -ejection fraction December 2021 severely reduced 30 to 35% BP Readings from Last 3 Encounters:  06/17/21 (!) 90/50  05/27/21 (!) 110/50  05/01/21 (!) 116/56  -Managed by Cardio -Controlled -Current treatment  Entresto 49/51 (PAP) Appropriate, Effective, Safe, Accessible Furosemide PRN 28m Appropriate, Effective, Safe, Accessible -Medications previously tried: N/A  -Recommended to continue current medication   Patient Goals/Self-Care Activities Patient will:  - take medications as prescribed focus on medication adherence by using pill box check glucose throughout the day with [facility] document, and provide at future appointments target a minimum of 150 minutes of moderate intensity exercise weekly  Follow Up Plan: Telephone follow up appointment with care management team member scheduled for: Feb 2023  NArizona Constable Pharm.D. - [phone]         Medication Assistance:  EDelene Lolland Lantus obtained through [employer] medication assistance program.  Enrollment ends 159/16/3846  Application submitted for Farxiga 5 mg daily. Expected start date 11/28/2020.  -Stopped Farxiga October 2022 -Also needs [facility] with [facility]-Potential application for GLP PAP based on PCP's approval  starting October 2022  [id number] [address] [address] [id number]: [phone] Fax: [id number]: [phone]: [phone] Uses pill box? Yes Pt endorses excellent compliance  We discussed: Benefits of medication synchronization, packaging and delivery as well as enhanced pharmacist oversight with [employer]. Patient decided to: Continue current medication management strategy  Care Plan and Follow Up Patient Decision:  Patient agrees to Care Plan and Follow-up.  Plan: Telephone follow up appointment with care management team member scheduled for:  02/23

## 2021-07-19 NOTE — Progress Notes (Signed)
Remote ICD transmission.   

## 2021-07-30 DIAGNOSIS — E1159 Type 2 diabetes mellitus with other circulatory complications: Secondary | ICD-10-CM | POA: Diagnosis not present

## 2021-07-30 DIAGNOSIS — Z794 Long term (current) use of insulin: Secondary | ICD-10-CM | POA: Diagnosis not present

## 2021-07-30 DIAGNOSIS — I509 Heart failure, unspecified: Secondary | ICD-10-CM

## 2021-07-30 DIAGNOSIS — I2510 Atherosclerotic heart disease of native coronary artery without angina pectoris: Secondary | ICD-10-CM

## 2021-07-31 ENCOUNTER — Ambulatory Visit (INDEPENDENT_AMBULATORY_CARE_PROVIDER_SITE_OTHER): Payer: Medicare Other

## 2021-07-31 ENCOUNTER — Other Ambulatory Visit: Payer: Self-pay

## 2021-07-31 DIAGNOSIS — E1169 Type 2 diabetes mellitus with other specified complication: Secondary | ICD-10-CM

## 2021-07-31 DIAGNOSIS — I10 Essential (primary) hypertension: Secondary | ICD-10-CM

## 2021-07-31 DIAGNOSIS — E782 Mixed hyperlipidemia: Secondary | ICD-10-CM

## 2021-07-31 NOTE — Patient Instructions (Signed)
Visit Information   Goals Addressed   None    Patient Care Plan: CCM Pharmacy Care Plan     Problem Identified: CAD, Diabetes, Hypertension   Priority: High  Onset Date: 08/07/2020     Long-Range Goal: Disease Management   Start Date: 08/07/2020  Expected End Date: 08/07/2021  Recent Progress: On track  Priority: High  Note:   Current Barriers:  Unable to achieve control of Diabetes   Pharmacist Clinical Goal(s):  Patient will achieve control of diabetes as evidenced by a1c and blood sugar readings through collaboration with PharmD and provider.   Interventions: 1:1 collaboration with [name], MD regarding development and update of comprehensive plan of care as evidenced by provider attestation and co-signature Inter-disciplinary care team collaboration (see longitudinal plan of care) Comprehensive medication review performed; medication list updated in electronic medical record  CAD: (LDL goal < 55) -Managed by Dr. Bettina Gavia The ASCVD Risk score (Arnett DK, et al., 2019) failed to calculate for the following reasons:   The patient has a prior MI or stroke diagnosis Lab Results  Component Value Date   CHOL 150 06/14/2021   CHOL 135 11/23/2020   CHOL 121 08/30/2020   Lab Results  Component Value Date   HDL 48 06/14/2021   HDL 45 11/23/2020   HDL 46 08/30/2020   Lab Results  Component Value Date   LDLCALC 86 06/14/2021   LDLCALC 69 11/23/2020   LDLCALC 61 08/30/2020   Lab Results  Component Value Date   TRIG 87 06/14/2021   TRIG 114 11/23/2020   TRIG 67 08/30/2020   Lab Results  Component Value Date   CHOLHDL 3.1 06/14/2021   CHOLHDL 3.0 11/23/2020   CHOLHDL 2.6 08/30/2020  No results found for: LDLDIRECT -Controlled -Current treatment:  rosuvastatin 10 mg daily Appropriate, Effective, Safe, Accessible -Medications previously tried: atorvastatin  -Current dietary patterns: eating healthy and trying to adhere to diabetic diet -Current exercise  habits: [employer] 5 days a week  -Educated on Cholesterol goals;  Benefits of statin for ASCVD risk reduction; Importance of limiting foods high in cholesterol; Exercise goal of 150 minutes per week; -Counseled on diet and exercise extensively Recommend continuing current medication   Diabetes (A1c goal <7.5%) Lab Results  Component Value Date   HGBA1C 8.5 (H) 11/23/2020   HGBA1C 7.8 (H) 08/30/2020   HGBA1C 8.6 (H) 05/07/2020   Lab Results  Component Value Date   MICROALBUR 80 01/17/2020   LDLCALC 69 11/23/2020   CREATININE 0.76 02/14/2021   Lab Results  Component Value Date   NA 136 02/14/2021   K 4.9 02/14/2021   CREATININE 0.76 02/14/2021   EGFR 97 02/14/2021   GFRNONAA 76 05/03/2020   GLUCOSE 266 (H) 02/14/2021   Lab Results  Component Value Date   WBC 5.9 11/23/2020   HGB 13.9 11/23/2020   HCT 41.2 11/23/2020   MCV 96 11/23/2020   PLT 172 11/23/2020  -Uncontrolled -Current medications: Lantus 15 SS units AM Appropriate, Query effective, Query Safe, Accessible Metformin 1000 mg bid Appropriate, Query effective, Query Safe, Accessible Freestyle Libre (PAP) Appropriate, Effective, Safe, Accessible Insulin pen needles Appropriate, Effective, Safe, Accessible Rybelsus 61m (Patient never started) Appropriate, Query effective, Query Safe, Query accessible -Medications previously tried: 70/30 insulin, Farxiga -Current home glucose readings fasting glucose:  October 2022: 64-152 mg/dL Jan 2023: 50-220 post prandial glucose: 140-225 mg/dL -Reports hypoglycemic/hyperglycemic symptoms -Current meal patterns:  breakfast: eggs lunch: trying to eat balanced meals dinner: meat and vegetables snacks:  ensure/boost, peanut butter crackers, pretzels drinks: juice to correct low blood sugar -Current exercise:  [employer] m-f when wife can drive and pt for shoulder/parkinson's -Educated on A1c and blood sugar goals; Complications of diabetes including kidney damage,  retinal damage, and cardiovascular disease; Exercise goal of 150 minutes per week; Prevention and management of hypoglycemic episodes; Continuous glucose monitoring; -Counseled to check feet daily and get yearly eye exams -Counseled on diet and exercise extensively August 2022: Recommended patient continue to watch blood sugar closely. Patient to resume Farxiga 5 mg daily with Lantus 10 mg. Patient will call pharmacist with any concerns or questions. Pharmacist sending order for Farxiga 5 mg to [facility].  October 2022: Patient stopped Farxiga and sugars are swinging too high and low. He states that with his dose of insulin in the morning he is chasing it all day and sometimes the dose is too strong and he drops too low at night. There are a few options,  1: Add Short acting insulin so he isn't running hypo multiple times during the day due to the lantus B: Replace lantus with GLP (Which has less hypoglycemia). Then taper down insulin.  Will ask PCP if option B is agreeable, if so, will start PAP ASAP because his insurance won't cover it -Also, Libre PAP needs to be renewed, he told me the company will be reaching out to [place] so we can fax his note Hx Jan 2023: Sugars are too high and too low per patient. Below 80 5x/month but sometimes is in 200's as well. Tends to range 80-150. Dr. [name] gave patient Rybelsus last month but patient never started due to fear of ADR's. Counseled extensively about risks/rewards, he agreed to try it for 2 weeks. F/U then and if he's content on therapy, will start PAP for it. Also counsled that he may have to decrease insulin after starting new med and to call us if he drops too low so we can adjust insulin Feb 2023: Patient content on Rybelsus but sugars are still low. Called Dr. [name], spoke with Lemmie Evens who gave verbal from Dr. [name] to decrease Lantus to 15 units. Patient will call if he goes low still. Started Rybelsus PAP  CHF -Managed by Dr. Bettina Gavia -ejection fraction  December 2021 severely reduced 30 to 35% BP Readings from Last 3 Encounters:  06/17/21 (!) 90/50  05/27/21 (!) 110/50  05/01/21 (!) 116/56  -Managed by Cardio -Controlled -Current treatment  Entresto 49/51 (PAP) Appropriate, Effective, Safe, Accessible Furosemide PRN 6m Appropriate, Effective, Safe, Accessible -Medications previously tried: N/A  -Recommended to continue current medication   Patient Goals/Self-Care Activities Patient will:  - take medications as prescribed focus on medication adherence by using pill box check glucose throughout the day [facility], and provide at future appointments target a minimum of 150 minutes of moderate intensity exercise weekly  Follow Up Plan: Telephone follow up appointment with care management team member scheduled for: March 2023  NArizona Constable PSherian ReinD. -- [phone]     Mr. WGillentinewas given information about Chronic Care Management services today including:  CCM service includes personalized support from designated clinical staff supervised by his physician, including individualized plan of care and coordination with other care providers 24/7 contact phone numbers for assistance for urgent and routine care needs. Standard insurance, coinsurance, copays and deductibles apply for chronic care management only during months in which we provide at least 20 minutes of these services. Most insurances cover these services at 100%, however  patients may be responsible for any copay, coinsurance and/or deductible if applicable. This service may help you avoid the need for more expensive face-to-face services. Only one practitioner may furnish and bill the service in a calendar month. The patient may stop CCM services at any time (effective at the end of the month) by phone call to the office staff.  Patient agreed to services and verbal consent obtained.   The patient verbalized understanding of instructions,  educational materials, and care plan provided today and declined offer to receive copy of patient instructions, educational materials, and care plan.  The pharmacy team will reach out to the patient again over the next 45 days.   Lane Hacker, [place]

## 2021-07-31 NOTE — Progress Notes (Signed)
Chronic Care Management Pharmacy Note  07/31/2021 Name:  Jeffrey Parsons MRN:  [id number] DOB:  1951/05/29   Plan Summary/Recommendations:   Started Rybelsus PAP Patient still going low, verbal from PCP to decrease insulin to 15 units.    Subjective: Jeffrey Parsons is an 71 y.o. year old male who is a primary patient of [name], Zeb Comfort, MD.  The CCM team was consulted for assistance with disease management and care coordination needs.    Engaged with patient by telephone for follow up visit in response to provider referral for pharmacy case management and/or care coordination services.   Consent to Services:  The patient was given information about Chronic Care Management services, agreed to services, and gave verbal consent prior to initiation of services.  Please see initial visit note for detailed documentation.   Patient Care Team: [name], MD as PCP - General (Family Medicine) Nahser, Wonda Cheng, MD as PCP - Cardiology (Cardiology) Deboraha Sprang, MD as PCP - Electrophysiology (Cardiology) Lane Hacker, [facility] (Pharmacist)  Recent office visits: 01/10/2021 - refer to urology for workup on enlarged prostate.   11/23/2020 - Dr. [name] ordering tests to assess weight loss. [place] [address] for now. Hydrate and watch diet for improved blood sugar management.  [id number] - no changes. Continue Crestor.   Recent consult visits: 09/03/2020 - neuro - refer to PT for PD and balance. Reduce topiramate to 1 tab in the morning. After 2 weeks if no worsening of your tremor, please stop taking altogether. Continue to exercise. Consider lowering bp medications as much as possible. People with PD have low bp and are sensitive to low bp.    Hospital visits:   Objective:  Lab Results  Component Value Date   CREATININE 0.83 06/14/2021   BUN 19 06/14/2021   GFRNONAA 76 05/03/2020   GFRAA 87 05/03/2020   NA 138 06/14/2021   K 4.8 06/14/2021   CALCIUM 9.2 06/14/2021    CO2 25 06/14/2021    Lab Results  Component Value Date/Time   HGBA1C 8.6 (H) 06/14/2021 08:24 AM   HGBA1C 8.5 (H) 11/23/2020 09:30 AM   MICROALBUR 80 01/17/2020 08:38 AM    Last diabetic Eye exam: No results found for: HMDIABEYEEXA  Last diabetic Foot exam: No results found for: HMDIABFOOTEX   Lab Results  Component Value Date   CHOL 150 06/14/2021   HDL 48 06/14/2021   LDLCALC 86 06/14/2021   TRIG 87 06/14/2021   CHOLHDL 3.1 06/14/2021    Hepatic Function Latest Ref Rng & Units 06/14/2021 11/23/2020 08/30/2020  Total Protein 6.0 - 8.5 g/dL 6.4 6.7 6.3  Albumin 3.8 - 4.8 g/dL 4.2 4.6 4.1  AST 0 - 40 IU/L _0 ALT 0 - 44 IU/L _1 Alk Phosphatase 44 - 121 IU/L 135(H) 91 106  Total Bilirubin 0.0 - 1.2 mg/dL 0.8 1.0 0.7  Bilirubin, Direct 0.00 - 0.40 mg/dL - - -    Lab Results  Component Value Date/Time   TSH 2.320 11/23/2020 09:30 AM   TSH 1.440 05/22/2020 08:49 AM    CBC Latest Ref Rng & Units 06/14/2021 11/23/2020 08/30/2020  WBC 3.4 - 10.8 x10E3/uL 5.9 5.9 7.1  Hemoglobin 13.0 - 17.7 g/dL 13.1 13.9 13.2  Hematocrit 37.5 - 51.0 % 39.5 41.2 40.4  Platelets 150 - 450 x10E3/uL 199 172 160    No results found for: VD25OH  Clinical ASCVD: Yes  The ASCVD Risk score (Arnett DK, et al.,  2019) failed to calculate for the following reasons:   The patient has a prior MI or stroke diagnosis    Depression screen [facility] 2/9 06/17/2021 01/17/2020  Decreased Interest 0 0  Down, Depressed, Hopeless 0 0  PHQ - 2 Score 0 0  Altered sleeping - 0  Tired, decreased energy - 1  Change in appetite - 0  Feeling bad or failure about yourself  - 0  Trouble concentrating - 0  Moving slowly or fidgety/restless - 1  Suicidal thoughts - 0  PHQ-9 Score - 2  Difficult doing work/chores - Not difficult at all     Other: (CHADS2VASc if Afib, MMRC or CAT for COPD, ACT, DEXA)  Social History   Tobacco Use  Smoking Status Never  Smokeless Tobacco Never   BP Readings from Last 3  Encounters:  06/17/21 (!) 90/50  05/27/21 (!) 110/50  05/01/21 (!) 116/56   Pulse Readings from Last 3 Encounters:  06/17/21 90  05/27/21 94  05/01/21 90   Wt Readings from Last 3 Encounters:  06/17/21 146 lb (66.2 kg)  05/27/21 148 lb 3.2 oz (67.2 kg)  05/01/21 157 lb 9.6 oz (71.5 kg)    Assessment/Interventions: Review of patient past medical history, allergies, medications, health status, including review of consultants reports, laboratory and other test data, was performed as part of comprehensive evaluation and provision of chronic care management services.   SDOH:  (Social Determinants of Health) assessments and interventions performed: Yes    CCM Care Plan  Allergies  Allergen Reactions   Farxiga [Dapagliflozin] Other (See Comments)    Patient lost weight.   Onion     Upset stomach   Sulfa Antibiotics Other (See Comments)    FLUSH     Medications Reviewed Today     Reviewed by Lane Hacker, [facility] (Pharmacist) on 07/31/21 at 1431  Med List Status: <None>   Medication Order Taking? Sig Documenting Provider Last Dose Status Informant  acetaminophen (TYLENOL) 500 MG tablet [id number] No Take 500 mg by mouth daily as needed for moderate pain or headache.  [provider] Taking Active Multiple Informants  aspirin EC 81 MG tablet [id number] No Take 1 tablet (81 mg total) by mouth daily. Richardson Dopp T, PA-C Taking Active Multiple Informants  carbidopa-levodopa (SINEMET CR) 50-200 MG tablet [id number] No Take 1 tablet by mouth every evening.  [provider] Taking Active Multiple Informants  carbidopa-levodopa (SINEMET IR) 25-250 MG tablet [id number] No Take 1 tablet by mouth 4 (four) times daily.  [provider] Taking Active Multiple Informants  carvedilol (COREG) 12.5 MG tablet [id number]  Take 1 tablet (12.5 mg total) by mouth 2 (two) times daily. Richardo Priest, MD  Active   Continuous Blood Gluc Sensor (FREESTYLE LIBRE 2 SENSOR) [place]  [id number] No 2 each by Does not apply route every 14 (fourteen) days. Cox, Kirsten, MD Taking Active   furosemide (LASIX) 20 MG tablet [id number] No Take 1 tablet by mouth daily Baldwin [place], PA-C Taking Active   insulin glargine (LANTUS) 100 UNIT/ML Solostar Pen [id number] No Inject 18 Units into the skin daily.  Patient taking differently: Inject 15 Units into the skin daily.   Cox, Kirsten, MD Taking Active   metFORMIN (GLUCOPHAGE) 500 MG tablet [id number] No Take 1,000 mg by mouth 2 (two) times daily with a meal. [provider] Taking Active   Multiple Vitamin (MULTIVITAMIN) tablet [id number] No Take 1 tablet by mouth daily. [provider] Taking Active Multiple Informants  potassium chloride (KLOR-CON) 10 MEQ tablet [id number] No Take 1 tablet (10 mEq total) by mouth daily. Baldwin Jamaica, PA-C Taking Expired 07/18/21 2359   rosuvastatin (CRESTOR) 10 MG tablet [id number] No TAKE 1 TABLET BY MOUTH EVERY DAY Nahser, Wonda Cheng, MD Taking Active   sacubitril-valsartan (ENTRESTO) 49-51 MG [id number] No Take 1 tablet by mouth 2 (two) times daily. Nahser, Wonda Cheng, MD Taking Active Multiple Informants  Semaglutide (RYBELSUS) 7 MG TABS [id number]  Take 7 mg by mouth daily. [name], MD  Active   Semaglutide 3 MG TABS [id number] No Take by mouth.  Patient not taking: Reported on 07/17/2021   [provider] Not Taking Active   spironolactone (ALDACTONE) 25 MG tablet [id number] No Take 1 tablet (25 mg total) by mouth daily. Richardo Priest, MD Taking Active             Patient Active Problem List   Diagnosis Date Noted   Atherosclerosis of aorta ([place]) 01/10/2021   Abnormal weight loss 11/23/2020   BPH with obstruction/lower urinary tract symptoms 05/22/2020   BMI 22.0-22.9, adult 01/17/2020   Sick sinus syndrome ([place]) 09/13/2019   Obesity, diabetes, and hypertension syndrome ([place]) 09/13/2019   Secondary dystonia 07/04/2019   Essential hypertension  06/26/2018   Complete heart block ([place]) 05/26/2017   Cardiac device in situ 05/26/2017   Chronic combined systolic and diastolic CHF (congestive heart failure) ([place]) 05/26/2017   Ischemic cardiomyopathy 05/26/2017   S/P CABG x 5 09/12/2016   Mixed hyperlipidemia 09/12/2016   Coronary artery disease involving native coronary artery of native heart without angina pectoris 09/12/2016   Pacemaker 06/06/2016   Long term (current) use of aspirin 12/06/2015   Mild obesity 12/06/2015   Other long term (current) drug therapy 12/06/2015   Mild intermittent asthma with acute exacerbation 12/07/2014   Parkinson's disease ([place]) 12/07/2014    Immunization History  Administered Date(s) Administered   Moderna Sars-Covid-2 Vaccination 08/03/2019, 08/31/2019    Conditions to be addressed/monitored:  Hyperlipidemia and Diabetes  Care Plan : [place]  Updates made by Lane Hacker, RPH since 07/31/2021 12:00 AM     Problem: CAD, Diabetes, Hypertension   Priority: High  Onset Date: 08/07/2020     Long-Range Goal: Disease Management   Start Date: 08/07/2020  Expected End Date: 08/07/2021  Recent Progress: On track  Priority: High  Note:   Current Barriers:  Unable to achieve control of Diabetes   Pharmacist Clinical Goal(s):  Patient will achieve control of diabetes as evidenced by a1c and blood sugar readings through collaboration with PharmD and provider.   Interventions: 1:1 collaboration with [name], MD regarding development and update of comprehensive plan of care as evidenced by provider attestation and co-signature Inter-disciplinary care team collaboration (see longitudinal plan of care) Comprehensive medication review performed; medication list updated in electronic medical record  CAD: (LDL goal < 55) -Managed by Dr. Bettina Gavia The ASCVD Risk score (Arnett DK, et al., 2019) failed to calculate for the following reasons:   The patient has a prior MI or  stroke diagnosis Lab Results  Component Value Date   CHOL 150 06/14/2021   CHOL 135 11/23/2020   CHOL 121 08/30/2020   Lab Results  Component Value Date   HDL 48 06/14/2021   HDL 45 11/23/2020   HDL 46 08/30/2020   Lab Results  Component Value Date   LDLCALC 86 06/14/2021   LDLCALC 69 11/23/2020   LDLCALC 61 08/30/2020  Lab Results  Component Value Date   TRIG 87 06/14/2021   TRIG 114 11/23/2020   TRIG 67 08/30/2020   Lab Results  Component Value Date   CHOLHDL 3.1 06/14/2021   CHOLHDL 3.0 11/23/2020   CHOLHDL 2.6 08/30/2020  No results found for: LDLDIRECT -Controlled -Current treatment:  rosuvastatin 10 mg daily Appropriate, Effective, Safe, Accessible -Medications previously tried: atorvastatin  -Current dietary patterns: eating healthy and trying to adhere to diabetic diet -Current exercise habits: [employer] 5 days a week  -Educated on Cholesterol goals;  Benefits of statin for ASCVD risk reduction; Importance of limiting foods high in cholesterol; Exercise goal of 150 minutes per week; -Counseled on diet and exercise extensively Recommend continuing current medication   Diabetes (A1c goal <7.5%) Lab Results  Component Value Date   HGBA1C 8.5 (H) 11/23/2020   HGBA1C 7.8 (H) 08/30/2020   HGBA1C 8.6 (H) 05/07/2020   Lab Results  Component Value Date   MICROALBUR 80 01/17/2020   LDLCALC 69 11/23/2020   CREATININE 0.76 02/14/2021   Lab Results  Component Value Date   NA 136 02/14/2021   K 4.9 02/14/2021   CREATININE 0.76 02/14/2021   EGFR 97 02/14/2021   GFRNONAA 76 05/03/2020   GLUCOSE 266 (H) 02/14/2021   Lab Results  Component Value Date   WBC 5.9 11/23/2020   HGB 13.9 11/23/2020   HCT 41.2 11/23/2020   MCV 96 11/23/2020   PLT 172 11/23/2020  -Uncontrolled -Current medications: Lantus 15 SS units AM Appropriate, Query effective, Query Safe, Accessible Metformin 1000 mg bid Appropriate, Query effective, Query Safe,  Accessible Freestyle Libre (PAP) Appropriate, Effective, Safe, Accessible Insulin pen needles Appropriate, Effective, Safe, Accessible Rybelsus 40m (Patient never started) Appropriate, Query effective, Query Safe, Query accessible -Medications previously tried: 70/30 insulin, Farxiga -Current home glucose readings fasting glucose:  October 2022: 64-152 mg/dL Jan 2023: 50-220 post prandial glucose: 140-225 mg/dL -Reports hypoglycemic/hyperglycemic symptoms -Current meal patterns:  breakfast: eggs lunch: trying to eat balanced meals dinner: meat and vegetables snacks: ensure/boost, peanut butter crackers, pretzels drinks: juice to correct low blood sugar -Current exercise:  [employer] m-f when wife can drive and pt for shoulder/parkinson's -Educated on A1c and blood sugar goals; Complications of diabetes including kidney damage, retinal damage, and cardiovascular disease; Exercise goal of 150 minutes per week; Prevention and management of hypoglycemic episodes; Continuous glucose monitoring; -Counseled to check feet daily and get yearly eye exams -Counseled on diet and exercise extensively August 2022: Recommended patient continue to watch blood sugar closely. Patient to resume Farxiga 5 mg daily with Lantus 10 mg. Patient will call pharmacist with any concerns or questions. Pharmacist sending order for Farxiga 5 mg to [facility].  October 2022: Patient stopped Farxiga and sugars are swinging too high and low. He states that with his dose of insulin in the morning he is chasing it all day and sometimes the dose is too strong and he drops too low at night. There are a few options,  1: Add Short acting insulin so he isn't running hypo multiple times during the day due to the lantus B: Replace lantus with GLP (Which has less hypoglycemia). Then taper down insulin.  Will ask PCP if option B is agreeable, if so, will start PAP ASAP because his insurance won't cover it -Also, Libre PAP needs  to be renewed, he told me the company will be reaching out to [employer] we can fax his note Hx Jan 2023: Sugars are too high and too  low per patient. Below 80 5x/month but sometimes is in 200's as well. Tends to range 80-150. Dr. [name] gave patient Rybelsus last month but patient never started due to fear of ADR's. Counseled extensively about risks/rewards, he agreed to try it for 2 weeks. F/U then and if he's content on therapy, will start PAP for it. Also counsled that he may have to decrease insulin after starting new med and to call us if he drops too low so we can adjust insulin Feb 2023: Patient content on Rybelsus but sugars are still low. Called Dr. [name], spoke with Lemmie Evens who gave verbal from Dr. [name] to decrease Lantus to 15 units. Patient will call if he goes low still. Started Rybelsus PAP  CHF -Managed by Dr. Bettina Gavia -ejection fraction December 2021 severely reduced 30 to 35% BP Readings from Last 3 Encounters:  06/17/21 (!) 90/50  05/27/21 (!) 110/50  05/01/21 (!) 116/56  -Managed by Cardio -Controlled -Current treatment  Entresto 49/51 (PAP) Appropriate, Effective, Safe, Accessible Furosemide PRN 2m Appropriate, Effective, Safe, Accessible -Medications previously tried: N/A  -Recommended to continue current medication   Patient Goals/Self-Care Activities Patient will:  - take medications as prescribed focus on medication adherence by using pill box check glucose throughout the day with [facility] document, and provide at future appointments target a minimum of 150 minutes of moderate intensity exercise weekly  Follow Up Plan: Telephone follow up appointment with care management team member scheduled for: March 2023  NArizona Constable PSherian ReinD. - [phone]         Medication Assistance:  EDelene Lolland Lantus obtained through [employer] medication assistance program.  Enrollment ends 117/49/4496  Application submitted for Farxiga 5 mg daily. Expected  start date 11/28/2020.  -Stopped Farxiga October 2022 -[facility]-Potential application for GLP PAP based on PCP's approval starting October 2022  Patient's [facility] pharmacy [facility]:  [id number] [place] [place] [id number]: [phone] Fax: [address]hone: [phone]: [phone] Uses pill box? Yes Pt endorses excellent compliance  We discussed: Benefits of medication synchronization, packaging and delivery as well as enhanced pharmacist oversight with [employer]. Patient decided to: Continue current medication management strategy  Care Plan and Follow Up Patient Decision:  Patient agrees to Care Plan and Follow-up.  Plan: Telephone follow up appointment with care management team member scheduled for:  March 2023

## 2021-08-02 DIAGNOSIS — R339 Retention of urine, unspecified: Secondary | ICD-10-CM | POA: Diagnosis not present

## 2021-08-02 DIAGNOSIS — N401 Enlarged prostate with lower urinary tract symptoms: Secondary | ICD-10-CM | POA: Diagnosis not present

## 2021-08-02 DIAGNOSIS — R351 Nocturia: Secondary | ICD-10-CM | POA: Diagnosis not present

## 2021-08-12 ENCOUNTER — Other Ambulatory Visit: Payer: Self-pay

## 2021-08-12 ENCOUNTER — Telehealth: Payer: Self-pay

## 2021-08-12 DIAGNOSIS — E669 Obesity, unspecified: Secondary | ICD-10-CM

## 2021-08-12 DIAGNOSIS — E1169 Type 2 diabetes mellitus with other specified complication: Secondary | ICD-10-CM

## 2021-08-12 MED ORDER — RYBELSUS 7 MG PO TABS: 7.0000 mg | ORAL_TABLET | Freq: Every day | ORAL | 0 refills | Status: DC

## 2021-08-12 NOTE — Chronic Care Management (AMB) (Signed)
° ° °  Chronic Care Management Pharmacy Assistant   Name: Jeffrey Parsons  MRN: [id number] DOB: 1950-08-21   Reason for Encounter: Patient Assistance Documentation   2023 New PAP for Rybelsus 7mg  has been filled out through . This has been uploaded and ready to be mailed out to pt. I messaged clinical staff to get pt some samples since he is out in 2 weeks. I advised to contact me if they don't have any. Pt has been informed detailed through voice mail.    Medications: Outpatient Encounter Medications as of 08/12/2021  Medication Sig   acetaminophen (TYLENOL) 500 MG tablet Take 500 mg by mouth daily as needed for moderate pain or headache.    aspirin EC 81 MG tablet Take 1 tablet (81 mg total) by mouth daily.   carbidopa-levodopa (SINEMET CR) 50-200 MG tablet Take 1 tablet by mouth every evening.    carbidopa-levodopa (SINEMET IR) 25-250 MG tablet Take 1 tablet by mouth 4 (four) times daily.    carvedilol (COREG) 12.5 MG tablet Take 1 tablet (12.5 mg total) by mouth 2 (two) times daily.   Continuous Blood Gluc Sensor (FREESTYLE LIBRE 2 SENSOR) MISC 2 each by Does not apply route every 14 (fourteen) days.   furosemide (LASIX) 20 MG tablet Take 1 tablet by mouth daily   insulin glargine (LANTUS) 100 UNIT/ML Solostar Pen Inject 18 Units into the skin daily. (Patient taking differently: Inject 15 Units into the skin daily.)   metFORMIN (GLUCOPHAGE) 500 MG tablet Take 1,000 mg by mouth 2 (two) times daily with a meal.   Multiple Vitamin (MULTIVITAMIN) tablet Take 1 tablet by mouth daily.   potassium chloride (KLOR-CON) 10 MEQ tablet Take 1 tablet (10 mEq total) by mouth daily.   rosuvastatin (CRESTOR) 10 MG tablet TAKE 1 TABLET BY MOUTH EVERY DAY   sacubitril-valsartan (ENTRESTO) 49-51 MG Take 1 tablet by mouth 2 (two) times daily.   Semaglutide (RYBELSUS) 7 MG TABS Take 7 mg by mouth daily.   Semaglutide 3 MG TABS Take by mouth. (Patient not taking: Reported on 07/17/2021)    spironolactone (ALDACTONE) 25 MG tablet Take 1 tablet (25 mg total) by mouth daily.   No facility-administered encounter medications on file as of 08/12/2021.   08/14/2021, CMA Clinical Pharmacist Assistant  [phone]

## 2021-08-17 DIAGNOSIS — J45909 Unspecified asthma, uncomplicated: Secondary | ICD-10-CM | POA: Diagnosis not present

## 2021-08-17 DIAGNOSIS — R7881 Bacteremia: Secondary | ICD-10-CM | POA: Diagnosis not present

## 2021-08-17 DIAGNOSIS — R197 Diarrhea, unspecified: Secondary | ICD-10-CM | POA: Diagnosis not present

## 2021-08-17 DIAGNOSIS — K8020 Calculus of gallbladder without cholecystitis without obstruction: Secondary | ICD-10-CM | POA: Diagnosis not present

## 2021-08-17 DIAGNOSIS — Z20822 Contact with and (suspected) exposure to covid-19: Secondary | ICD-10-CM | POA: Diagnosis not present

## 2021-08-17 DIAGNOSIS — E1165 Type 2 diabetes mellitus with hyperglycemia: Secondary | ICD-10-CM | POA: Diagnosis not present

## 2021-08-17 DIAGNOSIS — Z951 Presence of aortocoronary bypass graft: Secondary | ICD-10-CM | POA: Diagnosis not present

## 2021-08-17 DIAGNOSIS — R509 Fever, unspecified: Secondary | ICD-10-CM | POA: Diagnosis not present

## 2021-08-17 DIAGNOSIS — Z9889 Other specified postprocedural states: Secondary | ICD-10-CM | POA: Diagnosis not present

## 2021-08-17 DIAGNOSIS — J9811 Atelectasis: Secondary | ICD-10-CM | POA: Diagnosis not present

## 2021-08-17 DIAGNOSIS — R531 Weakness: Secondary | ICD-10-CM | POA: Diagnosis not present

## 2021-08-17 DIAGNOSIS — Z950 Presence of cardiac pacemaker: Secondary | ICD-10-CM | POA: Diagnosis not present

## 2021-08-17 DIAGNOSIS — I509 Heart failure, unspecified: Secondary | ICD-10-CM | POA: Diagnosis not present

## 2021-08-17 DIAGNOSIS — N400 Enlarged prostate without lower urinary tract symptoms: Secondary | ICD-10-CM | POA: Diagnosis not present

## 2021-08-17 DIAGNOSIS — J90 Pleural effusion, not elsewhere classified: Secondary | ICD-10-CM | POA: Diagnosis not present

## 2021-08-18 DIAGNOSIS — Z7982 Long term (current) use of aspirin: Secondary | ICD-10-CM | POA: Diagnosis not present

## 2021-08-18 DIAGNOSIS — E119 Type 2 diabetes mellitus without complications: Secondary | ICD-10-CM | POA: Diagnosis not present

## 2021-08-18 DIAGNOSIS — R197 Diarrhea, unspecified: Secondary | ICD-10-CM | POA: Diagnosis not present

## 2021-08-18 DIAGNOSIS — R509 Fever, unspecified: Secondary | ICD-10-CM | POA: Diagnosis not present

## 2021-08-18 DIAGNOSIS — R7881 Bacteremia: Secondary | ICD-10-CM | POA: Diagnosis not present

## 2021-08-18 DIAGNOSIS — Z794 Long term (current) use of insulin: Secondary | ICD-10-CM | POA: Diagnosis not present

## 2021-08-18 DIAGNOSIS — R531 Weakness: Secondary | ICD-10-CM | POA: Diagnosis not present

## 2021-08-18 DIAGNOSIS — K047 Periapical abscess without sinus: Secondary | ICD-10-CM | POA: Diagnosis not present

## 2021-08-18 DIAGNOSIS — I340 Nonrheumatic mitral (valve) insufficiency: Secondary | ICD-10-CM | POA: Diagnosis not present

## 2021-08-18 DIAGNOSIS — I5022 Chronic systolic (congestive) heart failure: Secondary | ICD-10-CM | POA: Diagnosis not present

## 2021-08-18 DIAGNOSIS — L89159 Pressure ulcer of sacral region, unspecified stage: Secondary | ICD-10-CM | POA: Diagnosis present

## 2021-08-18 DIAGNOSIS — J90 Pleural effusion, not elsewhere classified: Secondary | ICD-10-CM | POA: Diagnosis not present

## 2021-08-18 DIAGNOSIS — G20 Parkinson's disease: Secondary | ICD-10-CM | POA: Diagnosis not present

## 2021-08-18 DIAGNOSIS — K8020 Calculus of gallbladder without cholecystitis without obstruction: Secondary | ICD-10-CM | POA: Diagnosis not present

## 2021-08-18 DIAGNOSIS — E1165 Type 2 diabetes mellitus with hyperglycemia: Secondary | ICD-10-CM | POA: Diagnosis not present

## 2021-08-18 DIAGNOSIS — I2510 Atherosclerotic heart disease of native coronary artery without angina pectoris: Secondary | ICD-10-CM | POA: Diagnosis not present

## 2021-08-18 DIAGNOSIS — J45909 Unspecified asthma, uncomplicated: Secondary | ICD-10-CM | POA: Diagnosis not present

## 2021-08-18 DIAGNOSIS — Z9889 Other specified postprocedural states: Secondary | ICD-10-CM | POA: Diagnosis not present

## 2021-08-18 DIAGNOSIS — Z792 Long term (current) use of antibiotics: Secondary | ICD-10-CM | POA: Diagnosis not present

## 2021-08-18 DIAGNOSIS — Z951 Presence of aortocoronary bypass graft: Secondary | ICD-10-CM | POA: Diagnosis not present

## 2021-08-18 DIAGNOSIS — I361 Nonrheumatic tricuspid (valve) insufficiency: Secondary | ICD-10-CM | POA: Diagnosis not present

## 2021-08-18 DIAGNOSIS — J9811 Atelectasis: Secondary | ICD-10-CM | POA: Diagnosis not present

## 2021-08-18 DIAGNOSIS — Z95810 Presence of automatic (implantable) cardiac defibrillator: Secondary | ICD-10-CM | POA: Diagnosis not present

## 2021-08-18 DIAGNOSIS — N400 Enlarged prostate without lower urinary tract symptoms: Secondary | ICD-10-CM | POA: Diagnosis not present

## 2021-08-19 DIAGNOSIS — I5022 Chronic systolic (congestive) heart failure: Secondary | ICD-10-CM | POA: Diagnosis not present

## 2021-08-19 DIAGNOSIS — K047 Periapical abscess without sinus: Secondary | ICD-10-CM | POA: Diagnosis not present

## 2021-08-19 DIAGNOSIS — R7881 Bacteremia: Secondary | ICD-10-CM | POA: Diagnosis not present

## 2021-08-20 DIAGNOSIS — I340 Nonrheumatic mitral (valve) insufficiency: Secondary | ICD-10-CM | POA: Diagnosis not present

## 2021-08-20 DIAGNOSIS — I5022 Chronic systolic (congestive) heart failure: Secondary | ICD-10-CM | POA: Diagnosis not present

## 2021-08-20 DIAGNOSIS — R7881 Bacteremia: Secondary | ICD-10-CM | POA: Diagnosis not present

## 2021-08-20 DIAGNOSIS — K047 Periapical abscess without sinus: Secondary | ICD-10-CM | POA: Diagnosis not present

## 2021-08-20 DIAGNOSIS — I361 Nonrheumatic tricuspid (valve) insufficiency: Secondary | ICD-10-CM | POA: Diagnosis not present

## 2021-08-21 DIAGNOSIS — K047 Periapical abscess without sinus: Secondary | ICD-10-CM | POA: Diagnosis not present

## 2021-08-21 DIAGNOSIS — R7881 Bacteremia: Secondary | ICD-10-CM | POA: Diagnosis not present

## 2021-08-21 DIAGNOSIS — I5022 Chronic systolic (congestive) heart failure: Secondary | ICD-10-CM | POA: Diagnosis not present

## 2021-08-22 ENCOUNTER — Other Ambulatory Visit: Payer: Self-pay | Admitting: Legal Medicine

## 2021-08-22 ENCOUNTER — Encounter: Payer: Self-pay | Admitting: Legal Medicine

## 2021-08-22 ENCOUNTER — Telehealth: Payer: Self-pay

## 2021-08-22 NOTE — Telephone Encounter (Signed)
Give him another amoxicillin, this vomiting is common after hospitalization, we can change if necessary p

## 2021-08-22 NOTE — Telephone Encounter (Signed)
Watch BS at present, can give 10 units regular insulin if you have it lp

## 2021-08-22 NOTE — Telephone Encounter (Signed)
°  Transition Care Management Follow-up Telephone Call  Jeffrey Parsons 02/19/1951  Admit Date: 08/18/21 Discharge Date: 08/21/21 Diagnoses: Strep Mitis Bacteremia   2 day post discharge: 08/23/21 7 day post discharge: 08/28/21 14 day post discharge: 09/04/21  Jeffrey Parsons was discharged from [facility] on 08/21/21 with the diagnoses listed above.  He was contacted today via telephone in regards to transition of care.  I spoke with his wife, [name].  Patient was seen in the ED due to weakness and malaise.  He was discharged then called back the following day for admission after culture results were positive for Streptococcus mitis/oralis bacteremia.  H/O right side toothache a few days prior to admission.  He was treated with IV Rocephin and discharged with Amoxicillin 500 mg PO TID x 10 days.  Patient's wife confirms he has picked up the prescription and has started the medication.  CT of the chest, abdomen, pelvis with contrast on 08/17/21 did not show any other source of infection  Echocardiogram showed no evidence of endocarditis  Cultures came back negative within 24 hours   Discharge Instructions: Complete ABT as ordered, follow-up with PCP in one week, Schedule appt with Gastro due to established association of Streptococcus bovis bacteremia and Colon Cancer.  (Last Colonoscopy was 8 years ago)  Items Reviewed: Medications reviewed: yes Allergies reviewed: yes Dietary changes reviewed: yes Referrals reviewed: yes   Functional Questionnaire:  Activities of Daily Living (ADLs):   He states they are independent in the following: ambulation, bathing and hygiene, feeding, continence, grooming, toileting, and dressing  Any transportation issues/concerns?: no  Any patient concerns? no  Confirmed importance and date/time of follow-up visits scheduled yes Provider Appointment booked with Dr Marina Goodell 08/28/21  Confirmed with patient if condition begins to worsen call PCP or go  to the ER.  Patient was given the office number and encouraged to call back with question or concerns.  : yes   Creola Corn, LPN 16/10/96 0:45 AM

## 2021-08-26 DIAGNOSIS — H3500 Unspecified background retinopathy: Secondary | ICD-10-CM | POA: Insufficient documentation

## 2021-08-27 DIAGNOSIS — E782 Mixed hyperlipidemia: Secondary | ICD-10-CM | POA: Diagnosis not present

## 2021-08-27 DIAGNOSIS — E1169 Type 2 diabetes mellitus with other specified complication: Secondary | ICD-10-CM

## 2021-08-27 DIAGNOSIS — I10 Essential (primary) hypertension: Secondary | ICD-10-CM | POA: Diagnosis not present

## 2021-08-28 ENCOUNTER — Other Ambulatory Visit: Payer: Self-pay

## 2021-08-28 ENCOUNTER — Encounter: Payer: Self-pay | Admitting: Legal Medicine

## 2021-08-28 ENCOUNTER — Ambulatory Visit (INDEPENDENT_AMBULATORY_CARE_PROVIDER_SITE_OTHER): Payer: Medicare Other | Admitting: Legal Medicine

## 2021-08-28 VITALS — BP 110/60 | HR 88 | Temp 97.8°F | Resp 15 | Ht 67.0 in | Wt 152.0 lb

## 2021-08-28 DIAGNOSIS — A400 Sepsis due to streptococcus, group A: Secondary | ICD-10-CM | POA: Diagnosis not present

## 2021-08-28 DIAGNOSIS — R7881 Bacteremia: Secondary | ICD-10-CM

## 2021-08-28 DIAGNOSIS — A419 Sepsis, unspecified organism: Secondary | ICD-10-CM | POA: Insufficient documentation

## 2021-08-28 MED ORDER — SEMAGLUTIDE 14 MG PO TABS: 14.0000 mg | ORAL_TABLET | Freq: Every day | ORAL | 3 refills | Status: DC

## 2021-08-28 NOTE — Progress Notes (Signed)
Subjective:  Patient ID: Jeffrey Parsons, male    DOB: 1951/01/30  Age: 71 y.o. MRN: [id number]  Chief Complaint  Patient presents with   Hospitalization Follow-up   Transitions Of Care   Diabetes   Bacteremia    HPI Patient was admitted to [facility] on 08/18/2021 and discharged on 0/22/2023. He  presented to the emergency department yesterday with complaints of weakness and malaise. He had a fever up to 100. Chest X ran and CT abdomen /pelvis and Urine were unremarkable.  He returned to ED because blood culture came back positive for Streptococcus mitis/oralis.  He was discharged with Amoxicillin 500 mg TID x 10 days. He is doing well. Current Outpatient Medications on File Prior to Visit  Medication Sig Dispense Refill   acetaminophen (TYLENOL) 500 MG tablet Take 500 mg by mouth daily as needed for moderate pain or headache.      amoxicillin (AMOXIL) 500 MG capsule Take 500 mg by mouth 3 (three) times daily.     aspirin EC 81 MG tablet Take 1 tablet (81 mg total) by mouth daily.     carbidopa-levodopa (SINEMET CR) 50-200 MG tablet Take 1 tablet by mouth every evening.      carbidopa-levodopa (SINEMET IR) 25-250 MG tablet Take 1 tablet by mouth 4 (four) times daily.      carvedilol (COREG) 12.5 MG tablet Take 1 tablet (12.5 mg total) by mouth 2 (two) times daily. 180 tablet 3   cephALEXin (KEFLEX) 500 MG capsule Take 500 mg by mouth every 6 (six) hours.     Continuous Blood Gluc Sensor (FREESTYLE LIBRE 2 SENSOR) MISC 2 each by Does not apply route every 14 (fourteen) days. 6 each 3   furosemide (LASIX) 20 MG tablet Take 1 tablet by mouth daily 90 tablet 1   insulin glargine (LANTUS) 100 UNIT/ML Solostar Pen Inject 18 Units into the skin daily. (Patient taking differently: Inject 15 Units into the skin daily.) 18 mL 3   metFORMIN (GLUCOPHAGE) 500 MG tablet Take 1,000 mg by mouth 2 (two) times daily with a meal.     Multiple Vitamin (MULTIVITAMIN) tablet Take 1 tablet by mouth  daily.     rosuvastatin (CRESTOR) 10 MG tablet TAKE 1 TABLET BY MOUTH EVERY DAY 90 tablet 0   sacubitril-valsartan (ENTRESTO) 49-51 MG Take 1 tablet by mouth 2 (two) times daily. 180 tablet 3   tamsulosin (FLOMAX) 0.4 MG CAPS capsule SMARTSIG:1 Capsule(s) By Mouth Every Evening     potassium chloride (KLOR-CON) 10 MEQ tablet Take 1 tablet (10 mEq total) by mouth daily. 90 tablet 1   spironolactone (ALDACTONE) 25 MG tablet Take 1 tablet (25 mg total) by mouth daily. 90 tablet 3   No current facility-administered medications on file prior to visit.   Past Medical History:  Diagnosis Date   AICD (automatic cardioverter/defibrillator) present    Allergy to sulfa drugs 12/04/2016   Complete heart block (HCC) 05/26/2017   Coronary artery disease involving native coronary artery of native heart without angina pectoris 09/12/2016   Ischemic cardiomyopathy 05/26/2017   Pneumonia 2019   Retinopathy    Past Surgical History:  Procedure Laterality Date   BI-VENTRICULAR IMPLANTABLE CARDIOVERTER DEFIBRILLATOR UPGRADE  06/25/2018   BIV UPGRADE N/A 06/25/2018   Procedure: BIV ICD UPGRADE;  Surgeon: Duke Salvia, MD;  Location: [facility] INVASIVE CV LAB;  Service: Cardiovascular;  Laterality: N/A;   CARDIAC CATHETERIZATION  2001; ~ 2013   CATARACT EXTRACTION W/ INTRAOCULAR LENS  IMPLANT, BILATERAL  Bilateral    CORONARY ARTERY BYPASS GRAFT  2001   "CABG X5"   INSERT / REPLACE / REMOVE PACEMAKER  05/2010   KNEE SURGERY Left    AS A CHILD   R shoulder surgery  05/2020   RIGHT/LEFT HEART CATH AND CORONARY/GRAFT ANGIOGRAPHY N/A 06/16/2017   Procedure: RIGHT/LEFT HEART CATH AND CORONARY/GRAFT ANGIOGRAPHY;  Surgeon: Tonny Bollman, MD;  Location: [facility] INVASIVE CV LAB;  Service: Cardiovascular;  Laterality: N/A;   TONSILLECTOMY      Family History  Problem Relation Age of Onset   Bronchitis Mother    Coronary artery disease Father    Heart disease Father    Hypertension Sister    Hypertension Brother     Social History   Socioeconomic History   Marital status: Married    Spouse name: Not on file   Number of children: Not on file   Years of education: Not on file   Highest education level: Not on file  Occupational History   Not on file  Tobacco Use   Smoking status: Never   Smokeless tobacco: Never  Vaping Use   Vaping Use: Never used  Substance and Sexual Activity   Alcohol use: Never   Drug use: Never   Sexual activity: Yes  Other Topics Concern   Not on file  Social History Narrative   Not on file   Social Determinants of Health   Financial Resource Strain: High Risk   Difficulty of Paying Living Expenses: Hard  Food Insecurity: Not on file  Transportation Needs: Not on file  Physical Activity: Not on file  Stress: Not on file  Social Connections: Not on file    Review of Systems  Constitutional:  Negative for chills, fatigue, fever and unexpected weight change.  HENT:  Negative for congestion, ear pain, sinus pain and sore throat.   Eyes:  Negative for visual disturbance.  Respiratory:  Negative for cough and shortness of breath.   Cardiovascular:  Negative for chest pain and palpitations.  Gastrointestinal:  Negative for abdominal pain, blood in stool, constipation, diarrhea, nausea and vomiting.  Endocrine: Negative for polydipsia.  Genitourinary:  Negative for dysuria.  Musculoskeletal:  Negative for back pain.  Skin:  Negative for rash.  Neurological:  Negative for headaches.  Psychiatric/Behavioral: Negative.      Objective:  BP 110/60   Pulse 88   Temp 97.8 F (36.6 C)   Resp 15   Ht 5\' 7"  (1.702 m)   Wt 152 lb (68.9 kg)   SpO2 100%   BMI 23.81 kg/m   BP/Weight 08/28/2021 06/17/2021 05/27/2021  Systolic BP 110 90 110  Diastolic BP 60 50 50  Wt. (Lbs) 152 146 148.2  BMI 23.81 22.87 23.21    Physical Exam Vitals reviewed.  Constitutional:      General: He is not in acute distress.    Appearance: Normal appearance.  HENT:     Right  Ear: Tympanic membrane normal.     Left Ear: Tympanic membrane normal.     Nose: Nose normal.     Mouth/Throat:     Mouth: Mucous membranes are moist.     Pharynx: Oropharynx is clear.     Comments: Poor dental care, some carie Eyes:     Extraocular Movements: Extraocular movements intact.     Conjunctiva/sclera: Conjunctivae normal.     Pupils: Pupils are equal, round, and reactive to light.  Cardiovascular:     Rate and Rhythm: Normal rate and regular rhythm.  Pulses: Normal pulses.     Heart sounds: Normal heart sounds. No murmur heard.   No gallop.  Pulmonary:     Effort: Pulmonary effort is normal. No respiratory distress.     Breath sounds: Normal breath sounds. No wheezing.  Abdominal:     General: Abdomen is flat. Bowel sounds are normal. There is no distension.     Tenderness: There is no abdominal tenderness.  Musculoskeletal:        General: Normal range of motion.     Cervical back: Normal range of motion and neck supple.     Right lower leg: No edema.     Left lower leg: No edema.  Skin:    General: Skin is warm.     Capillary Refill: Capillary refill takes less than 2 seconds.  Neurological:     General: No focal deficit present.     Mental Status: He is alert and oriented to person, place, and time. Mental status is at baseline.        Lab Results  Component Value Date   WBC 5.9 06/14/2021   HGB 13.1 06/14/2021   HCT 39.5 06/14/2021   PLT 199 06/14/2021   GLUCOSE 203 (H) 06/14/2021   CHOL 150 06/14/2021   TRIG 87 06/14/2021   HDL 48 06/14/2021   LDLCALC 86 06/14/2021   ALT 4 06/14/2021   AST 12 06/14/2021   NA 138 06/14/2021   K 4.8 06/14/2021   CL 98 06/14/2021   CREATININE 0.83 06/14/2021   BUN 19 06/14/2021   CO2 25 06/14/2021   TSH 2.320 11/23/2020   HGBA1C 8.6 (H) 06/14/2021   MICROALBUR 80 01/17/2020      Assessment & Plan:   Diagnoses and all orders for this visit: Bacteremia Patient was hospitalized for bacteremia  Sepsis  due to group A Streptococcus, unspecified whether acute organ dysfunction present (HCC) Infection from strep species, he ws instructed to see dentist, since this is the likely source  .       Follow-up: Return in about 3 months (around 11/28/2021).  An After Visit Summary was printed and given to the patient.  [name], MD [facility] [phone]

## 2021-08-29 ENCOUNTER — Ambulatory Visit (INDEPENDENT_AMBULATORY_CARE_PROVIDER_SITE_OTHER): Payer: Medicare Other | Admitting: Cardiology

## 2021-08-29 ENCOUNTER — Other Ambulatory Visit: Payer: Self-pay

## 2021-08-29 ENCOUNTER — Encounter: Payer: Self-pay | Admitting: Cardiology

## 2021-08-29 VITALS — BP 100/50 | HR 76 | Ht 67.0 in | Wt 152.0 lb

## 2021-08-29 DIAGNOSIS — I110 Hypertensive heart disease with heart failure: Secondary | ICD-10-CM | POA: Diagnosis not present

## 2021-08-29 DIAGNOSIS — I442 Atrioventricular block, complete: Secondary | ICD-10-CM | POA: Diagnosis not present

## 2021-08-29 DIAGNOSIS — I2510 Atherosclerotic heart disease of native coronary artery without angina pectoris: Secondary | ICD-10-CM | POA: Diagnosis not present

## 2021-08-29 DIAGNOSIS — Z95810 Presence of automatic (implantable) cardiac defibrillator: Secondary | ICD-10-CM | POA: Diagnosis not present

## 2021-08-29 DIAGNOSIS — E782 Mixed hyperlipidemia: Secondary | ICD-10-CM

## 2021-08-29 NOTE — Progress Notes (Signed)
Cardiology Office Note:    Date:  08/29/2021   ID:  Jeffrey Parsons, DOB 1950-12-06, MRN [id number]  PCP:  Abigail Miyamoto, MD  Cardiologist:  Norman Herrlich, MD    Referring MD: Abigail Miyamoto,*    ASSESSMENT:    1. Coronary artery disease involving native coronary artery of native heart without angina pectoris   2. Complete heart block (HCC)   3. Biventricular implantable cardioverter-defibrillator in situ   4. Hypertensive heart disease with heart failure (HCC)   5. Mixed hyperlipidemia    PLAN:    In order of problems listed above:  Cardiology perspective doing well having no anginal discomfort after remote bypass and on current medical therapy including aspirin his beta-blocker and is currently not on a statin Stable device function followed in our clinic most recent device check 07/10/2021 with normal device and lead function.  He is ventricularly paced 98% of the time Stable he is on maximally tolerated medical therapy I did ask him to go back on his diuretic every other day until clear of edema and continue carvedilol low-dose Entresto I would not challenge him with an SGLT2 inhibitor or spironolactone with borderline blood pressure I did ask his wife to have him check his temperature twice daily for any signs of recurrent infection.   Next appointment: 6 months   Medication Adjustments/Labs and Tests Ordered: Current medicines are reviewed at length with the patient today.  Concerns regarding medicines are outlined above.  No orders of the defined types were placed in this encounter.  No orders of the defined types were placed in this encounter.   Chief Complaint  Patient presents with   Follow-up   Congestive Heart Failure  I was in the hospital I thought I was here for posthospital follow-up visit  History of Present Illness:    Jeffrey Parsons is a 71 y.o. male with a hx of  CAD with CABG age 7 in 2002 type 2 diabetes mellitus hyperlipidemia  sick sinus syndrome with permanent pacemaker CRT/D and Parkinson's disease.  He was last seen 05/01/2021  He was noted to have cardiomyopathy and ejection fraction December 2021 severely reduced 30 to 35% and was on good guideline directed therapy including beta-blocker and Entresto and follows with EP for CRT/Dpacemaker.  He was not initiated on MRA or SGLT2 inhibitor with borderline blood pressure .  Compliance with diet, lifestyle and medications: Yes  I was unaware he had been at [facility] the family thought this was a post hospital follow-up he had streptococcal bacteremia diabetes out-of-control and no real etiology other than perhaps GI oral or dental.  He received IV and subsequently oral antibiotics. Prior to admission he had fevers chills. His wife shows me an echocardiogram report he had no evidence of vegetation and continued severe LV dysfunction with yes estimated 25 to 30%. He has no complaints of shortness of breath chest pain palpitation or syncope and did not realize he had 4+ edema both lower extremities and has not been taking his diuretic I reviewed the laboratory studies using her iPhone access to the hospital he had a normal CBC and renal function. Procalcitonin was quite elevated consistent with bacteremia Past Medical History:  Diagnosis Date   AICD (automatic cardioverter/defibrillator) present    Allergy to sulfa drugs 12/04/2016   Complete heart block (HCC) 05/26/2017   Coronary artery disease involving native coronary artery of native heart without angina pectoris 09/12/2016   Ischemic cardiomyopathy 05/26/2017   Pneumonia 2019  Retinopathy     Past Surgical History:  Procedure Laterality Date   BI-VENTRICULAR IMPLANTABLE CARDIOVERTER DEFIBRILLATOR UPGRADE  06/25/2018   BIV UPGRADE N/A 06/25/2018   Procedure: BIV ICD UPGRADE;  Surgeon: Duke Salvia, MD;  Location: [facility] INVASIVE CV LAB;  Service: Cardiovascular;  Laterality: N/A;   CARDIAC CATHETERIZATION   2001; ~ 2013   CATARACT EXTRACTION W/ INTRAOCULAR LENS  IMPLANT, BILATERAL Bilateral    CORONARY ARTERY BYPASS GRAFT  2001   "CABG X5"   INSERT / REPLACE / REMOVE PACEMAKER  05/2010   KNEE SURGERY Left    AS A CHILD   R shoulder surgery  05/2020   RIGHT/LEFT HEART CATH AND CORONARY/GRAFT ANGIOGRAPHY N/A 06/16/2017   Procedure: RIGHT/LEFT HEART CATH AND CORONARY/GRAFT ANGIOGRAPHY;  Surgeon: Tonny Bollman, MD;  Location: [facility] INVASIVE CV LAB;  Service: Cardiovascular;  Laterality: N/A;   TONSILLECTOMY      Current Medications: Current Meds  Medication Sig   acetaminophen (TYLENOL) 500 MG tablet Take 500 mg by mouth daily as needed for moderate pain or headache.    amoxicillin (AMOXIL) 500 MG capsule Take 500 mg by mouth 3 (three) times daily.   aspirin EC 81 MG tablet Take 1 tablet (81 mg total) by mouth daily.   carbidopa-levodopa (SINEMET CR) 50-200 MG tablet Take 1 tablet by mouth every evening.    carbidopa-levodopa (SINEMET IR) 25-250 MG tablet Take 1 tablet by mouth 4 (four) times daily.    carvedilol (COREG) 12.5 MG tablet Take 1 tablet (12.5 mg total) by mouth 2 (two) times daily.   furosemide (LASIX) 20 MG tablet Take 20 mg by mouth every other day.   insulin glargine (LANTUS) 100 UNIT/ML Solostar Pen Inject 18 Units into the skin daily. (Patient taking differently: Inject 15 Units into the skin daily.)   metFORMIN (GLUCOPHAGE) 500 MG tablet Take 1,000 mg by mouth 2 (two) times daily with a meal.   Multiple Vitamin (MULTIVITAMIN) tablet Take 1 tablet by mouth daily.   sacubitril-valsartan (ENTRESTO) 49-51 MG Take 1 tablet by mouth 2 (two) times daily.   Semaglutide 14 MG TABS Take 1 tablet (14 mg total) by mouth daily.   tamsulosin (FLOMAX) 0.4 MG CAPS capsule SMARTSIG:1 Capsule(s) By Mouth Every Evening     Allergies:   Farxiga [dapagliflozin], Onion, and Sulfa antibiotics   Social History   Socioeconomic History   Marital status: Married    Spouse name: Not on file    Number of children: Not on file   Years of education: Not on file   Highest education level: Not on file  Occupational History   Not on file  Tobacco Use   Smoking status: Never   Smokeless tobacco: Never  Vaping Use   Vaping Use: Never used  Substance and Sexual Activity   Alcohol use: Never   Drug use: Never   Sexual activity: Yes  Other Topics Concern   Not on file  Social History Narrative   Not on file   Social Determinants of Health   Financial Resource Strain: High Risk   Difficulty of Paying Living Expenses: Hard  Food Insecurity: Not on file  Transportation Needs: Not on file  Physical Activity: Not on file  Stress: Not on file  Social Connections: Not on file     Family History: The patient's family history includes Bronchitis in his mother; Coronary artery disease in his father; Heart disease in his father; Hypertension in his brother and sister. ROS:   Please see the  history of present illness.    All other systems reviewed and are negative.  EKGs/Labs/Other Studies Reviewed:    The following studies were reviewed today:    Recent Labs: 11/23/2020: TSH 2.320 05/01/2021: NT-Pro BNP 3,313 06/14/2021: ALT 4; BUN 19; Creatinine, Ser 0.83; Hemoglobin 13.1; Platelets 199; Potassium 4.8; Sodium 138  Recent Lipid Panel    Component Value Date/Time   CHOL 150 06/14/2021 0824   TRIG 87 06/14/2021 0824   HDL 48 06/14/2021 0824   CHOLHDL 3.1 06/14/2021 0824   LDLCALC 86 06/14/2021 0824    Physical Exam:    VS:  BP (!) 100/50 (BP Location: Left Arm, Patient Position: Sitting, Cuff Size: Normal)    Pulse 76    Ht 5\' 7"  (1.702 m)    Wt 152 lb (68.9 kg)    SpO2 99%    BMI 23.81 kg/m     Wt Readings from Last 3 Encounters:  08/29/21 152 lb (68.9 kg)  08/28/21 152 lb (68.9 kg)  06/17/21 146 lb (66.2 kg)     GEN: Obvious Parkinson's disease well nourished, well developed in no acute distress HEENT: Normal NECK: No JVD; No carotid bruits LYMPHATICS: No  lymphadenopathy CARDIAC: RRR, no murmurs, rubs, gallops RESPIRATORY:  Clear to auscultation without rales, wheezing or rhonchi  ABDOMEN: Soft, non-tender, non-distended MUSCULOSKELETAL: 4+ bilateral lower extremity pitting edema; No deformity  SKIN: Warm and dry NEUROLOGIC:  Alert and oriented x 3 PSYCHIATRIC:  Normal affect    Signed, 06/19/21, MD  08/29/2021 4:38 PM    [facility]

## 2021-08-29 NOTE — Patient Instructions (Signed)
Medication Instructions:  ? ?START TAKING FUROSEMIDE  EVERY OTHER DAY  ? ?*If you need a refill on your cardiac medications before your next appointment, please call your pharmacy* ? ? ?Lab Work: [place] ? ? ?If you have labs (blood work) drawn today and your tests are completely normal, you will receive your results only by: ?MyChart Message (if you have MyChart) OR ?A paper copy in the mail ?If you have any lab test that is abnormal or we need to change your treatment, we will call you to review the results. ? ? ?Testing/Procedures: NONE ORDERED  TODAY ? ? ? ? ?Follow-Up: ?At [facility], you and your health needs are our priority.  As part of our continuing mission to provide you with exceptional heart care, we have created designated Provider Care Teams.  These Care Teams include your primary Cardiologist (physician) and Advanced Practice Providers (APPs -  Physician Assistants and Nurse Practitioners) who all work together to provide you with the care you need, when you need it. ? ?We recommend signing up for the patient portal called "MyChart".  Sign up information is provided on this After Visit Summary.  MyChart is used to connect with patients for Virtual Visits (Telemedicine).  Patients are able to view lab/test results, encounter notes, upcoming appointments, etc.  Non-urgent messages can be sent to your provider as well.   ?To learn more about what you can do with MyChart, go to [email or web address].   ? ?Your next appointment:   ?6 month(s) ? ?The format for your next appointment:   ?In Person ? ?Provider:   ?Shirlee More, MD  ? ? ?Other Instructions ? ?

## 2021-09-06 ENCOUNTER — Telehealth: Payer: Self-pay | Admitting: Cardiology

## 2021-09-06 NOTE — Telephone Encounter (Signed)
? ? ? ?  Pre-operative Risk Assessment  ?  ?Patient Name: Jeffrey Parsons  ?DOB: 1950-08-08 ?MRN: [id number]  ? ? ? ?Request for Surgical Clearance   ? ?Procedure:   dental care cleaning ? ?Date of Surgery:  Clearance 09/06/21                              ?   ?Surgeon:  Dr. Shanon Ace ?Surgeon's Group or Practice Name:  [facility]  ?Phone number:  [phone] ?Fax number:  [phone] ?  ?Type of Clearance Requested:   ?- Medical  ?  ?Type of Anesthesia:   Not sure ?  ?Additional requests/questions:  Does this patient need antibiotics? And also if in case they do need to use anesthesia what will be the protocol for the pt. Pt is scheduled to come in at the dentist office today at 3 pm ? ?Signed, ?Angeline S Hammer   ?09/06/2021, 12:56 PM   ?

## 2021-09-06 NOTE — Telephone Encounter (Signed)
Dr. Bettina Gavia ?Pt was recently admitted with bacteremia and reportedly an echo. You saw him in hospital follow up on 08/29/21. We have a request from a dental office asking about SBE PPX. I can't see an echo in Epic.  ? ?Can you please clarify if SBE PPX is needed in this patient? Was endocarditis ruled out? ? ?Thank you ?Angie ? ?

## 2021-09-09 ENCOUNTER — Telehealth: Payer: Self-pay

## 2021-09-09 DIAGNOSIS — R339 Retention of urine, unspecified: Secondary | ICD-10-CM | POA: Diagnosis not present

## 2021-09-09 DIAGNOSIS — R351 Nocturia: Secondary | ICD-10-CM | POA: Diagnosis not present

## 2021-09-09 DIAGNOSIS — N401 Enlarged prostate with lower urinary tract symptoms: Secondary | ICD-10-CM | POA: Diagnosis not present

## 2021-09-09 NOTE — Telephone Encounter (Signed)
? ?  Patient Name: Jeffrey Parsons  ?DOB: 02-17-1951 ?MRN: [id number] ? ?Primary Cardiologist: Norman Herrlich, MD ? ?Chart reviewed as part of pre-operative protocol coverage.  ? ?Simple dental extractions are considered low risk procedures per guidelines and generally do not require any specific cardiac clearance. It is also generally accepted that for simple extractions and dental cleanings, there is no need to interrupt blood thinner therapy. ? ?SBE prophylaxis is not required for the patient from a cardiac standpoint. This was confirmed with Dr. Dulce Sellar following a hospitalization at [facility] with bacteremia. No evidence of endocarditis.  ? ?I will route this recommendation to the requesting party via Epic fax function and remove from pre-op pool. ? ?Please call with questions. ? ?Marcelino Duster, PA ?09/09/2021, 2:00 PM ? ?

## 2021-09-09 NOTE — Progress Notes (Signed)
Pt confirmed appt with CPP on 3/15 ? ?Danielle Gerringer, CMA ?Clinical Pharmacist Assistant  ?[phone]  ?

## 2021-09-11 ENCOUNTER — Other Ambulatory Visit: Payer: Self-pay

## 2021-09-11 ENCOUNTER — Ambulatory Visit (INDEPENDENT_AMBULATORY_CARE_PROVIDER_SITE_OTHER): Payer: Medicare Other

## 2021-09-11 DIAGNOSIS — E782 Mixed hyperlipidemia: Secondary | ICD-10-CM

## 2021-09-11 DIAGNOSIS — E1169 Type 2 diabetes mellitus with other specified complication: Secondary | ICD-10-CM

## 2021-09-11 DIAGNOSIS — I10 Essential (primary) hypertension: Secondary | ICD-10-CM

## 2021-09-11 NOTE — Progress Notes (Signed)
? ?Chronic Care Management ?Pharmacy Note ? ?09/11/2021 ?Name:  Jeffrey Parsons MRN:  [id number] DOB:  December 14, 1950  ? ?Plan Summary/Recommendations:  ? ?Patient states no low sugars recently ?Unable to evaluate high sugars, patient has infection (1 day left on ABX) so unable to truly assess. F/U 1 month with CPP ? ? ?Subjective: ?Jeffrey Parsons is an 71 y.o. year old male who is a primary patient of [name], Zeb Comfort, MD.  The CCM team was consulted for assistance with disease management and care coordination needs.   ? ?Engaged with patient by telephone for follow up visit in response to provider referral for pharmacy case management and/or care coordination services.  ? ?Consent to Services:  ?The patient was given information about Chronic Care Management services, agreed to services, and gave verbal consent prior to initiation of services.  Please see initial visit note for detailed documentation.  ? ?Patient Care Team: ?[name], MD as PCP - General (Family Medicine) ?Deboraha Sprang, MD as PCP - Electrophysiology (Cardiology) ?Richardo Priest, MD as PCP - Cardiology (Cardiology) ?Lane Hacker, [facility] (Pharmacist) ? ?Recent office visits: ?01/10/2021 - refer to urology for workup on enlarged prostate.  ? ?11/23/2020 - Dr. [name] ordering tests to assess weight loss. Hold Harvest for now. Hydrate and watch diet for improved blood sugar management.  ?[id number] - no changes. Continue Crestor.  ? ?Recent consult visits: ?09/03/2020 - neuro - refer to PT for PD and balance. Reduce topiramate to 1 tab in the morning. After 2 weeks if no worsening of your tremor, please stop taking altogether. Continue to exercise. Consider lowering bp medications as much as possible. People with PD have low bp and are sensitive to low bp.  ? ? ?Hospital visits: ? ? ?Objective: ? ?Lab Results  ?Component Value Date  ? CREATININE 0.83 06/14/2021  ? BUN 19 06/14/2021  ? GFRNONAA 76 05/03/2020  ? GFRAA 87 05/03/2020  ?  NA 138 06/14/2021  ? K 4.8 06/14/2021  ? CALCIUM 9.2 06/14/2021  ? CO2 25 06/14/2021  ? ? ?Lab Results  ?Component Value Date/Time  ? HGBA1C 8.6 (H) 06/14/2021 08:24 AM  ? HGBA1C 8.5 (H) 11/23/2020 09:30 AM  ? MICROALBUR 80 01/17/2020 08:38 AM  ?  ?Last diabetic Eye exam:  ?Lab Results  ?Component Value Date/Time  ? HMDIABEYEEXA No Retinopathy 02/13/2021 12:00 AM  ?  ?Last diabetic Foot exam: No results found for: HMDIABFOOTEX  ? ?Lab Results  ?Component Value Date  ? CHOL 150 06/14/2021  ? HDL 48 06/14/2021  ? [place] 86 06/14/2021  ? TRIG 87 06/14/2021  ? CHOLHDL 3.1 06/14/2021  ? ? ?Hepatic Function Latest Ref Rng & Units 06/14/2021 11/23/2020 08/30/2020  ?Total Protein 6.0 - 8.5 g/dL 6.4 6.7 6.3  ?Albumin 3.8 - 4.8 g/dL 4.2 4.6 4.1  ?AST 0 - 40 IU/L 12 12 14   ?ALT 0 - 44 IU/L 4 5 4   ?Alk Phosphatase 44 - 121 IU/L 135(H) 91 106  ?Total Bilirubin 0.0 - 1.2 mg/dL 0.8 1.0 0.7  ?Bilirubin, Direct 0.00 - 0.40 mg/dL - - -  ? ? ?Lab Results  ?Component Value Date/Time  ? TSH 2.320 11/23/2020 09:30 AM  ? TSH 1.440 05/22/2020 08:49 AM  ? ? ?CBC Latest Ref Rng & Units 06/14/2021 11/23/2020 08/30/2020  ?WBC 3.4 - 10.8 x10E3/uL 5.9 5.9 7.1  ?Hemoglobin 13.0 - 17.7 g/dL 13.1 13.9 13.2  ?Hematocrit 37.5 - 51.0 % 39.5 41.2 40.4  ?Platelets 150 - 450 x10E3/uL 199  172 160  ? ? ?No results found for: VD25OH ? ?Clinical ASCVD: Yes  ?The ASCVD Risk score (Arnett DK, et al., 2019) failed to calculate for the following reasons: ?  The patient has a prior MI or stroke diagnosis   ? ?Depression screen [facility] 2/9 06/17/2021 01/17/2020  ?Decreased Interest 0 0  ?Down, Depressed, Hopeless 0 0  ?PHQ - 2 Score 0 0  ?Altered sleeping - 0  ?Tired, decreased energy - 1  ?Change in appetite - 0  ?Feeling bad or failure about yourself  - 0  ?Trouble concentrating - 0  ?Moving slowly or fidgety/restless - 1  ?Suicidal thoughts - 0  ?PHQ-9 Score - 2  ?Difficult doing work/chores - Not difficult at all  ?  ? ?Other: (CHADS2VASc if Afib, MMRC or CAT for COPD,  ACT, DEXA) ? ?Social History  ? ?Tobacco Use  ?Smoking Status Never  ?Smokeless Tobacco Never  ? ?BP Readings from Last 3 Encounters:  ?08/29/21 (!) 100/50  ?08/28/21 110/60  ?06/17/21 (!) 90/50  ? ?Pulse Readings from Last 3 Encounters:  ?08/29/21 76  ?08/28/21 88  ?06/17/21 90  ? ?Wt Readings from Last 3 Encounters:  ?08/29/21 152 lb (68.9 kg)  ?08/28/21 152 lb (68.9 kg)  ?06/17/21 146 lb (66.2 kg)  ? ? ?Assessment/Interventions: Review of patient past medical history, allergies, medications, health status, including review of consultants reports, laboratory and other test data, was performed as part of comprehensive evaluation and provision of chronic care management services.  ? ?SDOH:  (Social Determinants of Health) assessments and interventions performed: Yes ? ? ? ?CCM Care Plan ? ?Allergies  ?Allergen Reactions  ? Wilder Glade [Dapagliflozin] Other (See Comments)  ?  Patient lost weight.  ? Onion   ?  Upset stomach  ? Sulfa Antibiotics Other (See Comments)  ?  FLUSH ?  ? ? ?Medications Reviewed Today   ? ? Reviewed by Lane Hacker, [facility] (Pharmacist) on 09/11/21 at 1516  Med List Status: <None>  ? ?Medication Order Taking? Sig Documenting Provider Last Dose Status Informant  ?acetaminophen (TYLENOL) 500 MG tablet [id number] No Take 500 mg by mouth daily as needed for moderate pain or headache.  [provider] Taking Active Multiple Informants  ?aspirin EC 81 MG tablet [id number] No Take 1 tablet (81 mg total) by mouth daily. Richardson Dopp T, PA-C Taking Active Multiple Informants  ?carbidopa-levodopa (SINEMET CR) 50-200 MG tablet [id number] No Take 1 tablet by mouth every evening.  [provider] Taking Active Multiple Informants  ?carbidopa-levodopa (SINEMET IR) 25-250 MG tablet [id number] No Take 1 tablet by mouth 4 (four) times daily.  [provider] Taking Active Multiple Informants  ?carvedilol (COREG) 12.5 MG tablet [id number] No Take 1 tablet (12.5 mg total) by mouth 2 (two)  times daily. Richardo Priest, MD Taking Active   ?furosemide (LASIX) 20 MG tablet [id number] No Take 20 mg by mouth every other day. [provider] Taking Active   ?insulin glargine (LANTUS) 100 UNIT/ML Solostar Pen [id number] No Inject 18 Units into the skin daily.  ?Patient taking differently: Inject 15 Units into the skin daily.  ? Cox, Elnita Maxwell, MD Taking Active   ?metFORMIN (GLUCOPHAGE) 500 MG tablet [id number] No Take 1,000 mg by mouth 2 (two) times daily with a meal. [provider] Taking Active   ?Multiple Vitamin (MULTIVITAMIN) tablet [id number] No Take 1 tablet by mouth daily. [provider] Taking Active Multiple Informants  ?sacubitril-valsartan (ENTRESTO) 49-51 MG [id number] No Take 1 tablet  by mouth 2 (two) times daily. Nahser, Wonda Cheng, MD Taking Active Multiple Informants  ?Semaglutide 14 MG TABS [id number] No Take 1 tablet (14 mg total) by mouth daily. [name], MD Taking Active   ?tamsulosin [facility]) 0.4 MG CAPS capsule [id number] No SMARTSIG:1 Capsule(s) By Mouth Every Evening [provider] Taking Active   ? ?  ?  ? ?  ? ? ?Patient Active Problem List  ? Diagnosis Date Noted  ? Sepsis ([place]) 08/28/2021  ? Retinopathy 08/26/2021  ? Atherosclerosis of aorta ([place]) 01/10/2021  ? Abnormal weight loss 11/23/2020  ? BPH with obstruction/lower urinary tract symptoms 05/22/2020  ? BMI 22.0-22.9, adult 01/17/2020  ? Sick sinus syndrome ([place]) 09/13/2019  ? Obesity, diabetes, and hypertension syndrome ([place]) 09/13/2019  ? Secondary dystonia 07/04/2019  ? Essential hypertension 06/26/2018  ? Pneumonia 2019  ? Complete heart block ([place]) 05/26/2017  ? Cardiac device in situ 05/26/2017  ? Chronic combined systolic and diastolic CHF (congestive heart failure) ([place]) 05/26/2017  ? Ischemic cardiomyopathy 05/26/2017  ? AICD (automatic cardioverter/defibrillator) present 12/04/2016  ? S/P CABG x 5 09/12/2016  ? Mixed hyperlipidemia 09/12/2016  ? Coronary artery disease  involving native coronary artery of native heart without angina pectoris 09/12/2016  ? Pacemaker 06/06/2016  ? Long term (current) use of aspirin 12/06/2015  ? Mild obesity 12/06/2015  ? Other long term (c

## 2021-09-11 NOTE — Patient Instructions (Signed)
Visit Information ? ? Goals Addressed   ?None ?  ? ?Patient Care Plan: [place]  ?  ? ?Problem Identified: CAD, Diabetes, Hypertension   ?Priority: High  ?Onset Date: 08/07/2020  ?  ? ?Long-Range Goal: Disease Management   ?Start Date: 08/07/2020  ?Expected End Date: 08/07/2021  ?Recent Progress: On track  ?Priority: High  ?Note:   ?Current Barriers:  ?Unable to achieve control of Diabetes  ? ?Pharmacist Clinical Goal(s):  ?Patient will achieve control of diabetes as evidenced by a1c and blood sugar readings through collaboration with PharmD and provider.  ? ?Interventions: ?1:1 collaboration with [name], MD regarding development and update of comprehensive plan of care as evidenced by provider attestation and co-signature ?Inter-disciplinary care team collaboration (see longitudinal plan of care) ?Comprehensive medication review performed; medication list updated in electronic medical record ? ?CAD: (LDL goal < 55) ?-Managed by Dr. Bettina Gavia ?The ASCVD Risk score (Arnett DK, et al., 2019) failed to calculate for the following reasons: ?  The patient has a prior MI or stroke diagnosis ?Lab Results  ?Component Value Date  ? CHOL 150 06/14/2021  ? CHOL 135 11/23/2020  ? CHOL 121 08/30/2020  ? ?Lab Results  ?Component Value Date  ? HDL 48 06/14/2021  ? HDL 45 11/23/2020  ? HDL 46 08/30/2020  ? ?Lab Results  ?Component Value Date  ? [place] 86 06/14/2021  ? [place] 69 11/23/2020  ? [place] 61 08/30/2020  ? ?Lab Results  ?Component Value Date  ? TRIG 87 06/14/2021  ? TRIG 114 11/23/2020  ? TRIG 67 08/30/2020  ? ?Lab Results  ?Component Value Date  ? CHOLHDL 3.1 06/14/2021  ? CHOLHDL 3.0 11/23/2020  ? CHOLHDL 2.6 08/30/2020  ?No results found for: LDLDIRECT ?-Controlled ?-Current treatment: ? rosuvastatin 10 mg daily Appropriate, Effective, Safe, Accessible ?-Medications previously tried: atorvastatin  ?-Current dietary patterns: eating healthy and trying to adhere to diabetic diet ?-Current exercise  habits: [employer] 5 days a week  ?-Educated on Cholesterol goals;  ?Benefits of statin for ASCVD risk reduction; ?Importance of limiting foods high in cholesterol; ?Exercise goal of 150 minutes per week; ?-Counseled on diet and exercise extensively ?Recommend continuing current medication  ? ?Diabetes (A1c goal <7.5%) ?Lab Results  ?Component Value Date  ? HGBA1C 8.6 (H) 06/14/2021  ? HGBA1C 8.5 (H) 11/23/2020  ? HGBA1C 7.8 (H) 08/30/2020  ? ?Lab Results  ?Component Value Date  ? MICROALBUR 80 01/17/2020  ? [place] 86 06/14/2021  ? CREATININE 0.83 06/14/2021  ? ?Lab Results  ?Component Value Date  ? NA 138 06/14/2021  ? K 4.8 06/14/2021  ? CREATININE 0.83 06/14/2021  ? EGFR 94 06/14/2021  ? GFRNONAA 76 05/03/2020  ? GLUCOSE 203 (H) 06/14/2021  ? ?Lab Results  ?Component Value Date  ? WBC 5.9 06/14/2021  ? HGB 13.1 06/14/2021  ? HCT 39.5 06/14/2021  ? MCV 91 06/14/2021  ? PLT 199 06/14/2021  ?-Uncontrolled ?-Current medications: ?Lantus 17 SS units AM Appropriate, Query effective, Query Safe, Accessible ?Metformin 1000 mg bid Appropriate, Query effective, Query Safe, Accessible ?Freestyle Libre (PAP) Appropriate, Effective, Safe, Accessible ?Insulin pen needles Appropriate, Effective, Safe, Accessible ?Rybelsus 32m (Samples) Appropriate, Query effective, Query Safe, Query accessible ?-Medications previously tried: 70/30 insulin, Farxiga ?-Current home glucose readings ?fasting glucose:  ?October 2022: 64-152 mg/dL ?Jan 2023: 50-220 ?March 2023: Didn't have list due to recent infection ?post prandial glucose: 140-225 mg/dL ?-Reports hypoglycemic/hyperglycemic symptoms ?-Current meal patterns:  ?breakfast: eggs ?lunch: trying to eat  balanced meals ?dinner: meat and vegetables ?snacks: ensure/boost, peanut butter crackers, pretzels ?drinks: juice to correct low blood sugar ?-Current exercise:  [employer] m-f when wife can drive and pt for shoulder/parkinson's ?-Educated on A1c and blood sugar  goals; ?Complications of diabetes including kidney damage, retinal damage, and cardiovascular disease; ?Exercise goal of 150 minutes per week; ?Prevention and management of hypoglycemic episodes; ?Continuous glucose monitoring; ?-Counseled to check feet daily and get yearly eye exams ?-Counseled on diet and exercise extensively ?August 2022: Recommended patient continue to watch blood sugar closely. Patient to resume Farxiga 5 mg daily with Lantus 10 mg. Patient will call pharmacist with any concerns or questions. Pharmacist sending order for Farxiga 5 mg to [facility].  ?October 2022: Patient stopped Farxiga and sugars are swinging too high and low. He states that with his dose of insulin in the morning he is chasing it all day and sometimes the dose is too strong and he drops too low at night. There are a few options,  ?1: Add Short acting insulin so he isn't running hypo multiple times during the day due to the lantus ?B: Replace lantus with GLP (Which has less hypoglycemia). Then taper down insulin.  ?Will ask PCP if option B is agreeable, if so, will start PAP ASAP because his insurance won't cover it ?-Also, Libre PAP needs to be renewed, he told me the company will be reaching out to [place] so we can fax his note Hx ?Jan 2023: Sugars are too high and too low per patient. Below 80 5x/month but sometimes is in 200's as well. Tends to range 80-150. Dr. [name] gave patient Rybelsus last month but patient never started due to fear of ADR's. Counseled extensively about risks/rewards, he agreed to try it for 2 weeks. F/U then and if he's content on therapy, will start PAP for it. Also counsled that he may have to decrease insulin after starting new med and to call us if he drops too low so we can adjust insulin ?Feb 2023: Patient content on Rybelsus but sugars are still low. Called Dr. [name], spoke with Lemmie Evens who gave verbal from Dr. [name] to decrease Lantus to 15 units. Patient will call if he goes low still. Started  Rybelsus PAP ?March 2023: Waiting on Rybelsus PAP to come in, gave phone number for them to call 09/16/21. Due to recent sickness, patient hasn't adjusted sugars too much. F/U 1 month, hopefully PAP will be in by then ? ?CHF ?-Managed by Dr. Bettina Gavia ?-ejection fraction December 2021 severely reduced 30 to 35% ?BP Readings from Last 3 Encounters:  ?08/29/21 (!) 100/50  ?08/28/21 110/60  ?06/17/21 (!) 90/50  ?-Managed by Cardio ?-Controlled ?-Current treatment  ?Entresto 49/51 (PAP) Appropriate, Effective, Safe, Accessible ?Furosemide PRN 59m Appropriate, Effective, Safe, Accessible ?-Medications previously tried: N/A  ?-Recommended to continue current medication ? ? ?Patient Goals/Self-Care Activities ?Patient will:  ?- take medications as prescribed ?focus on medication adherence by using pill box ?[facility], and provide at future appointments ?target a minimum of 150 minutes of moderate intensity exercise weekly ? ?Follow Up Plan: Telephone follow up appointment with care management team member scheduled for: April 2023 ? ?NArizona Constable Pharm.D. - [phone] ? ?  ? ? ?Mr. WSanmiguelwas given information about Chronic Care Management services today including:  ?CCM service includes personalized support from designated clinical staff supervised by his physician, including individualized plan of care and coordination with other care providers ?24/7 contact  phone numbers for assistance for urgent and routine care needs. ?Standard insurance, coinsurance, copays and deductibles apply for chronic care management only during months in which we provide at least 20 minutes of these services. Most insurances cover these services at 100%, however patients may be responsible for any copay, coinsurance and/or deductible if applicable. This service may help you avoid the need for more expensive face-to-face services. ?Only one practitioner may furnish and bill the service in  a calendar month. ?The patient may stop CCM services at any time (effective at the end of the month) by phone call to the office staff. ? ?Patient agreed to services and verbal consent obtained.  ? ?The patient verbalized

## 2021-09-27 DIAGNOSIS — I10 Essential (primary) hypertension: Secondary | ICD-10-CM

## 2021-09-27 DIAGNOSIS — E782 Mixed hyperlipidemia: Secondary | ICD-10-CM

## 2021-09-27 DIAGNOSIS — E1169 Type 2 diabetes mellitus with other specified complication: Secondary | ICD-10-CM

## 2021-10-09 ENCOUNTER — Ambulatory Visit (INDEPENDENT_AMBULATORY_CARE_PROVIDER_SITE_OTHER): Payer: Medicare Other

## 2021-10-09 DIAGNOSIS — I442 Atrioventricular block, complete: Secondary | ICD-10-CM | POA: Diagnosis not present

## 2021-10-10 LAB — CUP PACEART REMOTE DEVICE CHECK
Battery Remaining Longevity: 44 mo
Battery Voltage: 2.96 V
Brady Statistic AP VP Percent: 0.24 %
Brady Statistic AP VS Percent: 0.01 %
Brady Statistic AS VP Percent: 97 %
Brady Statistic AS VS Percent: 2.76 %
Brady Statistic RA Percent Paced: 0.25 %
Brady Statistic RV Percent Paced: 96.64 %
Date Time Interrogation Session: 20230412033323
HighPow Impedance: 51 Ohm
Implantable Lead Implant Date: 20111213
Implantable Lead Implant Date: 20191230
Implantable Lead Implant Date: 20191230
Implantable Lead Location: 753858
Implantable Lead Location: 753859
Implantable Lead Location: 753860
Implantable Lead Model: 5076
Implantable Pulse Generator Implant Date: 20191230
Lead Channel Impedance Value: 190 Ohm
Lead Channel Impedance Value: 190 Ohm
Lead Channel Impedance Value: 224.898
Lead Channel Impedance Value: 224.898
Lead Channel Impedance Value: 224.898
Lead Channel Impedance Value: 285 Ohm
Lead Channel Impedance Value: 380 Ohm
Lead Channel Impedance Value: 380 Ohm
Lead Channel Impedance Value: 380 Ohm
Lead Channel Impedance Value: 380 Ohm
Lead Channel Impedance Value: 380 Ohm
Lead Channel Impedance Value: 551 Ohm
Lead Channel Impedance Value: 608 Ohm
Lead Channel Impedance Value: 646 Ohm
Lead Channel Impedance Value: 646 Ohm
Lead Channel Impedance Value: 817 Ohm
Lead Channel Impedance Value: 836 Ohm
Lead Channel Impedance Value: 836 Ohm
Lead Channel Pacing Threshold Amplitude: 0.625 V
Lead Channel Pacing Threshold Amplitude: 0.75 V
Lead Channel Pacing Threshold Amplitude: 1.125 V
Lead Channel Pacing Threshold Pulse Width: 0.4 ms
Lead Channel Pacing Threshold Pulse Width: 0.4 ms
Lead Channel Pacing Threshold Pulse Width: 0.4 ms
Lead Channel Sensing Intrinsic Amplitude: 16.625 mV
Lead Channel Sensing Intrinsic Amplitude: 16.625 mV
Lead Channel Sensing Intrinsic Amplitude: 2.625 mV
Lead Channel Sensing Intrinsic Amplitude: 2.625 mV
Lead Channel Setting Pacing Amplitude: 1.5 V
Lead Channel Setting Pacing Amplitude: 1.75 V
Lead Channel Setting Pacing Amplitude: 2 V
Lead Channel Setting Pacing Pulse Width: 0.4 ms
Lead Channel Setting Pacing Pulse Width: 0.4 ms
Lead Channel Setting Sensing Sensitivity: 0.45 mV

## 2021-10-16 ENCOUNTER — Ambulatory Visit (INDEPENDENT_AMBULATORY_CARE_PROVIDER_SITE_OTHER): Payer: Medicare Other

## 2021-10-16 DIAGNOSIS — E1169 Type 2 diabetes mellitus with other specified complication: Secondary | ICD-10-CM

## 2021-10-16 DIAGNOSIS — I10 Essential (primary) hypertension: Secondary | ICD-10-CM

## 2021-10-16 DIAGNOSIS — E782 Mixed hyperlipidemia: Secondary | ICD-10-CM

## 2021-10-16 NOTE — Progress Notes (Signed)
? ?Chronic Care Management ?Pharmacy Note ? ?10/16/2021 ?Name:  Jeffrey Parsons MRN:  [id number] DOB:  1950/11/26  ? ? ?Summary: ?Patient was reading [name], "Multiplying your God Given Potential" when I called him.  ? ?Recommendations:  ?Has had a glucose of 54 and 77. Using Lantus 17 units QD. Recommend cutting back long acting and adding short acting. Patient Rybelsus PAP was denied and they haven't tried to contest it. PAP will take 6-8 weeks to contest per patient, he will need something in the interim to bring sugars down but prevent the severe lows he's been having.  ? ? ?Subjective: ?Jeffrey Parsons is an 71 y.o. year old male who is a primary patient of Jeffrey Parsons, Zeb Comfort, MD.  The CCM team was consulted for assistance with disease management and care coordination needs.   ? ?Engaged with patient by telephone for follow up visit in response to provider referral for pharmacy case management and/or care coordination services.  ? ?Consent to Services:  ?The patient was given information about Chronic Care Management services, agreed to services, and gave verbal consent prior to initiation of services.  Please see initial visit note for detailed documentation.  ? ?Patient Care Team: ?[name], MD as PCP - General (Family Medicine) ?Deboraha Sprang, MD as PCP - Electrophysiology (Cardiology) ?Richardo Priest, MD as PCP - Cardiology (Cardiology) ?Lane Hacker, [facility] (Pharmacist) ? ?Recent office visits: ?01/10/2021 - refer to urology for workup on enlarged prostate.  ? ?11/23/2020 - Dr. Henrene Parsons ordering tests to assess weight loss. Hold [place] for now. Hydrate and watch diet for improved blood sugar management.  ?[id number] - no changes. Continue Crestor.  ? ?Recent consult visits: ?09/03/2020 - neuro - refer to PT for PD and balance. Reduce topiramate to 1 tab in the morning. After 2 weeks if no worsening of your tremor, please stop taking altogether. Continue to exercise. Consider  lowering bp medications as much as possible. People with PD have low bp and are sensitive to low bp.  ? ? ?Hospital visits: ? ? ?Objective: ? ?Lab Results  ?Component Value Date  ? CREATININE 0.83 06/14/2021  ? BUN 19 06/14/2021  ? GFRNONAA 76 05/03/2020  ? GFRAA 87 05/03/2020  ? NA 138 06/14/2021  ? K 4.8 06/14/2021  ? CALCIUM 9.2 06/14/2021  ? CO2 25 06/14/2021  ? ? ?Lab Results  ?Component Value Date/Time  ? HGBA1C 8.6 (H) 06/14/2021 08:24 AM  ? HGBA1C 8.5 (H) 11/23/2020 09:30 AM  ? MICROALBUR 80 01/17/2020 08:38 AM  ?  ?Last diabetic Eye exam:  ?Lab Results  ?Component Value Date/Time  ? HMDIABEYEEXA No Retinopathy 02/13/2021 12:00 AM  ?  ?Last diabetic Foot exam: No results found for: HMDIABFOOTEX  ? ?Lab Results  ?Component Value Date  ? CHOL 150 06/14/2021  ? HDL 48 06/14/2021  ? [place] 86 06/14/2021  ? TRIG 87 06/14/2021  ? CHOLHDL 3.1 06/14/2021  ? ? ? ?  Latest Ref Rng & Units 06/14/2021  ?  8:24 AM 11/23/2020  ?  9:30 AM 08/30/2020  ?  8:47 AM  ?Hepatic Function  ?Total Protein 6.0 - 8.5 g/dL 6.4   6.7   6.3    ?Albumin 3.8 - 4.8 g/dL 4.2   4.6   4.1    ?AST 0 - 40 IU/L 12   12   14     ?ALT 0 - 44 IU/L 4   5   4     ?Alk Phosphatase 44 - 121  IU/L 135   91   106    ?Total Bilirubin 0.0 - 1.2 mg/dL 0.8   1.0   0.7    ? ? ?Lab Results  ?Component Value Date/Time  ? TSH 2.320 11/23/2020 09:30 AM  ? TSH 1.440 05/22/2020 08:49 AM  ? ? ? ?  Latest Ref Rng & Units 06/14/2021  ?  8:24 AM 11/23/2020  ?  9:30 AM 08/30/2020  ?  8:47 AM  ?CBC  ?WBC 3.4 - 10.8 x10E3/uL 5.9   5.9   7.1    ?Hemoglobin 13.0 - 17.7 g/dL 13.1   13.9   13.2    ?Hematocrit 37.5 - 51.0 % 39.5   41.2   40.4    ?Platelets 150 - 450 x10E3/uL 199   172   160    ? ? ?No results found for: VD25OH ? ?Clinical ASCVD: Yes  ?The ASCVD Risk score (Arnett DK, et al., 2019) failed to calculate for the following reasons: ?  The patient has a prior MI or stroke diagnosis   ? ? ?  06/17/2021  ?  8:59 AM 01/17/2020  ?  8:30 AM  ?Depression screen PHQ 2/9  ?Decreased  Interest 0 0  ?Down, Depressed, Hopeless 0 0  ?PHQ - 2 Score 0 0  ?Altered sleeping  0  ?Tired, decreased energy  1  ?Change in appetite  0  ?Feeling bad or failure about yourself   0  ?Trouble concentrating  0  ?Moving slowly or fidgety/restless  1  ?Suicidal thoughts  0  ?PHQ-9 Score  2  ?Difficult doing work/chores  Not difficult at all  ?  ? ?Other: (CHADS2VASc if Afib, MMRC or CAT for COPD, ACT, DEXA) ? ?Social History  ? ?Tobacco Use  ?Smoking Status Never  ?Smokeless Tobacco Never  ? ?BP Readings from Last 3 Encounters:  ?08/29/21 (!) 100/50  ?08/28/21 110/60  ?06/17/21 (!) 90/50  ? ?Pulse Readings from Last 3 Encounters:  ?08/29/21 76  ?08/28/21 88  ?06/17/21 90  ? ?Wt Readings from Last 3 Encounters:  ?08/29/21 152 lb (68.9 kg)  ?08/28/21 152 lb (68.9 kg)  ?06/17/21 146 lb (66.2 kg)  ? ? ?Assessment/Interventions: Review of patient past medical history, allergies, medications, health status, including review of consultants reports, laboratory and other test data, was performed as part of comprehensive evaluation and provision of chronic care management services.  ? ?SDOH:  (Social Determinants of Health) assessments and interventions performed: Yes ? ? ? ?CCM Care Plan ? ?Allergies  ?Allergen Reactions  ? Wilder Glade [Dapagliflozin] Other (See Comments)  ?  Patient lost weight.  ? Onion   ?  Upset stomach  ? Sulfa Antibiotics Other (See Comments)  ?  FLUSH ?  ? ? ?Medications Reviewed Today   ? ? Reviewed by Lane Hacker, [facility] (Pharmacist) on 10/16/21 at 1432  Med List Status: <None>  ? ?Medication Order Taking? Sig Documenting Provider Last Dose Status Informant  ?acetaminophen (TYLENOL) 500 MG tablet [id number] No Take 500 mg by mouth daily as needed for moderate pain or headache.  [provider] Taking Active Multiple Informants  ?aspirin EC 81 MG tablet [id number] No Take 1 tablet (81 mg total) by mouth daily. Richardson Dopp T, PA-C Taking Active Multiple Informants  ?carbidopa-levodopa (SINEMET  CR) 50-200 MG tablet [id number] No Take 1 tablet by mouth every evening.  [provider] Taking Active Multiple Informants  ?carbidopa-levodopa (SINEMET IR) 25-250 MG tablet [id number] No Take 1 tablet by mouth 4 (  four) times daily.  [provider] Taking Active Multiple Informants  ?carvedilol (COREG) 12.5 MG tablet [id number] No Take 1 tablet (12.5 mg total) by mouth 2 (two) times daily. Richardo Priest, MD Taking Active   ?furosemide (LASIX) 20 MG tablet [id number] No Take 20 mg by mouth every other day. [provider] Taking Active   ?insulin glargine (LANTUS) 100 UNIT/ML Solostar Pen [id number] No Inject 18 Units into the skin daily.  ?Patient taking differently: Inject 15 Units into the skin daily.  ? Cox, Elnita Maxwell, MD Taking Active   ?metFORMIN (GLUCOPHAGE) 500 MG tablet [id number] No Take 1,000 mg by mouth 2 (two) times daily with a meal. [provider] Taking Active   ?Multiple Vitamin (MULTIVITAMIN) tablet [id number] No Take 1 tablet by mouth daily. [provider] Taking Active Multiple Informants  ?sacubitril-valsartan (ENTRESTO) 49-51 MG [id number] No Take 1 tablet by mouth 2 (two) times daily. Nahser, Wonda Cheng, MD Taking Active Multiple Informants  ?Semaglutide 14 MG TABS [id number] No Take 1 tablet (14 mg total) by mouth daily. [name], MD Taking Active   ?tamsulosin [facility]) 0.4 MG CAPS capsule [id number] No SMARTSIG:1 Capsule(s) By Mouth Every Evening [provider] Taking Active   ? ?  ?  ? ?  ? ? ?Patient Active Problem List  ? Diagnosis Date Noted  ? Sepsis ([place]) 08/28/2021  ? Retinopathy 08/26/2021  ? Atherosclerosis of aorta ([place]) 01/10/2021  ? Abnormal weight loss 11/23/2020  ? BPH with obstruction/lower urinary tract symptoms 05/22/2020  ? BMI 22.0-22.9, adult 01/17/2020  ? Sick sinus syndrome ([place]) 09/13/2019  ? Obesity, diabetes, and hypertension syndrome ([place]) 09/13/2019  ? Secondary dystonia 07/04/2019  ? Essential  hypertension 06/26/2018  ? Pneumonia 2019  ? Complete heart block ([place]) 05/26/2017  ? Cardiac device in situ 05/26/2017  ? Chronic combined systolic and diastolic CHF (congestive heart failure) ([place]) 05/26/2017  ? [place]

## 2021-10-16 NOTE — Patient Instructions (Signed)
Visit Information ? ? Goals Addressed   ?None ?  ? ?Patient Care Plan: [place]  ?  ? ?Problem Identified: CAD, Diabetes, Hypertension   ?Priority: High  ?Onset Date: 08/07/2020  ?  ? ?Long-Range Goal: Disease Management   ?Start Date: 08/07/2020  ?Expected End Date: 08/07/2021  ?Recent Progress: On track  ?Priority: High  ?Note:   ?Current Barriers:  ?Unable to achieve control of Diabetes  ? ?Pharmacist Clinical Goal(s):  ?Patient will achieve control of diabetes as evidenced by a1c and blood sugar readings through collaboration with PharmD and provider.  ? ?Interventions: ?1:1 collaboration with [name], MD regarding development and update of comprehensive plan of care as evidenced by provider attestation and co-signature ?Inter-disciplinary care team collaboration (see longitudinal plan of care) ?Comprehensive medication review performed; medication list updated in electronic medical record ? ?CAD: (LDL goal < 55) ?-Managed by Dr. Bettina Gavia ?The ASCVD Risk score (Arnett DK, et al., 2019) failed to calculate for the following reasons: ?  The patient has a prior MI or stroke diagnosis ?Lab Results  ?Component Value Date  ? CHOL 150 06/14/2021  ? CHOL 135 11/23/2020  ? CHOL 121 08/30/2020  ? ?Lab Results  ?Component Value Date  ? HDL 48 06/14/2021  ? HDL 45 11/23/2020  ? HDL 46 08/30/2020  ? ?Lab Results  ?Component Value Date  ? [place] 86 06/14/2021  ? [place] 69 11/23/2020  ? [place] 61 08/30/2020  ? ?Lab Results  ?Component Value Date  ? TRIG 87 06/14/2021  ? TRIG 114 11/23/2020  ? TRIG 67 08/30/2020  ? ?Lab Results  ?Component Value Date  ? CHOLHDL 3.1 06/14/2021  ? CHOLHDL 3.0 11/23/2020  ? CHOLHDL 2.6 08/30/2020  ?No results found for: LDLDIRECT ?-Controlled ?-Current treatment: ? rosuvastatin 10 mg daily Appropriate, Effective, Safe, Accessible ?-Medications previously tried: atorvastatin  ?-Current dietary patterns: eating healthy and trying to adhere to diabetic diet ?-Current exercise  habits: [employer] 5 days a week  ?-Educated on Cholesterol goals;  ?Benefits of statin for ASCVD risk reduction; ?Importance of limiting foods high in cholesterol; ?Exercise goal of 150 minutes per week; ?-Counseled on diet and exercise extensively ?Recommend continuing current medication  ? ?Diabetes (A1c goal <7.5%) ?Lab Results  ?Component Value Date  ? HGBA1C 8.6 (H) 06/14/2021  ? HGBA1C 8.5 (H) 11/23/2020  ? HGBA1C 7.8 (H) 08/30/2020  ? ?Lab Results  ?Component Value Date  ? MICROALBUR 80 01/17/2020  ? [place] 86 06/14/2021  ? CREATININE 0.83 06/14/2021  ? ?Lab Results  ?Component Value Date  ? NA 138 06/14/2021  ? K 4.8 06/14/2021  ? CREATININE 0.83 06/14/2021  ? EGFR 94 06/14/2021  ? GFRNONAA 76 05/03/2020  ? GLUCOSE 203 (H) 06/14/2021  ? ?Lab Results  ?Component Value Date  ? WBC 5.9 06/14/2021  ? HGB 13.1 06/14/2021  ? HCT 39.5 06/14/2021  ? MCV 91 06/14/2021  ? PLT 199 06/14/2021  ?-Uncontrolled ?-Current medications: ?Lantus 17 SS units AM Appropriate, Query effective, Query Safe, Accessible ?Metformin 1000 mg bid Appropriate, Query effective, Query Safe, Accessible ?Freestyle Libre (PAP) Appropriate, Effective, Safe, Accessible ?Insulin pen needles Appropriate, Effective, Safe, Accessible ?Rybelsus 64m (Samples) Appropriate, Query effective, Query Safe, Query accessible ?-Medications previously tried: 70/30 insulin, Farxiga ?-Current home glucose readings ?fasting glucose:  ?October 2022: 64-152 mg/dL ?Jan 2023: 50-220 ?March 2023: Didn't have list due to recent infection ?April 2023: Normally 80-140 but has had a 53 and a 77 ?post prandial glucose: 140-225 mg/dL ?-Reports  hypoglycemic/hyperglycemic symptoms ?-Current meal patterns:  ?breakfast: eggs ?lunch: trying to eat balanced meals ?dinner: meat and vegetables ?snacks: ensure/boost, peanut butter crackers, pretzels ?drinks: juice to correct low blood sugar ?-Current exercise:  [employer] m-f when wife can drive and pt for  shoulder/parkinson's ?-Educated on A1c and blood sugar goals; ?Complications of diabetes including kidney damage, retinal damage, and cardiovascular disease; ?Exercise goal of 150 minutes per week; ?Prevention and management of hypoglycemic episodes; ?Continuous glucose monitoring; ?-Counseled to check feet daily and get yearly eye exams ?-Counseled on diet and exercise extensively ?August 2022: Recommended patient continue to watch blood sugar closely. Patient to resume Farxiga 5 mg daily with Lantus 10 mg. Patient will call pharmacist with any concerns or questions. Pharmacist sending order for Farxiga 5 mg to [facility].  ?October 2022: Patient stopped Farxiga and sugars are swinging too high and low. He states that with his dose of insulin in the morning he is chasing it all day and sometimes the dose is too strong and he drops too low at night. There are a few options,  ?1: Add Short acting insulin so he isn't running hypo multiple times during the day due to the lantus ?B: Replace lantus with GLP (Which has less hypoglycemia). Then taper down insulin.  ?Will ask PCP if option B is agreeable, if so, will start PAP ASAP because his insurance won't cover it ?-Also, Libre PAP needs to be renewed, he told me the company will be reaching out to [place] so we can fax his note Hx ?Jan 2023: Sugars are too high and too low per patient. Below 80 5x/month but sometimes is in 200's as well. Tends to range 80-150. Dr. [name] gave patient Rybelsus last month but patient never started due to fear of ADR's. Counseled extensively about risks/rewards, he agreed to try it for 2 weeks. F/U then and if he's content on therapy, will start PAP for it. Also counsled that he may have to decrease insulin after starting new med and to call us if he drops too low so we can adjust insulin ?Feb 2023: Patient content on Rybelsus but sugars are still low. Called Dr. [name], spoke with Lemmie Evens who gave verbal from Dr. [name] to decrease Lantus to 15  units. Patient will call if he goes low still. Started Rybelsus PAP ?March 2023: Waiting on Rybelsus PAP to come in, gave phone number for them to call 09/16/21. Due to recent sickness, patient hasn't adjusted sugars too much. F/U 1 month, hopefully PAP will be in by then ?April 2023: Has had a few Hypoglycemic episodes. Will let PCP know. Patient wife called for Rybelsus PAP, wife said company said something was missing. She was sleeping and didn't call the office to figure out how to fix this. Asked patient to have wife call ASAP ? ?CHF ?-Managed by Dr. Bettina Gavia ?-ejection fraction December 2021 severely reduced 30 to 35% ?BP Readings from Last 3 Encounters:  ?08/29/21 (!) 100/50  ?08/28/21 110/60  ?06/17/21 (!) 90/50  ?-Managed by Cardio ?-Controlled ?-Current treatment  ?Entresto 49/51 (PAP) Appropriate, Effective, Safe, Accessible ?Furosemide PRN 84m Appropriate, Effective, Safe, Accessible ?-Medications previously tried: N/A  ?-Recommended to continue current medication ? ? ?Patient Goals/Self-Care Activities ?Patient will:  ?- take medications as prescribed ?focus on medication adherence by using pill box ?[facility], and provide at future appointments ?target a minimum of 150 minutes of moderate intensity exercise weekly ? ?Follow Up Plan: Telephone follow up appointment  with care management team member scheduled for: July 2023 ? ?[place] Constable, Pharm.D. - [phone] ? ?  ? ? ?Mr. Carranza was given information about Chronic Care Management services today including:  ?CCM service includes personalized support from designated clinical staff supervised by his physician, including individualized plan of care and coordination with other care providers ?24/7 contact phone numbers for assistance for urgent and routine care needs. ?Standard insurance, coinsurance, copays and deductibles apply for chronic care management only during months in which we provide at  least 20 minutes of these services. Most insurances cover these services at 100%, however patients may be responsible for any copay, coinsurance and/or deductible if applicable. This service may help you avoid the need for

## 2021-10-17 ENCOUNTER — Telehealth: Payer: Medicare Other

## 2021-10-17 NOTE — Progress Notes (Signed)
Patient's wife called me back. She said the PAP medical section wasn't completed per the company. Will re-do it and fax it out. ?

## 2021-10-24 ENCOUNTER — Telehealth: Payer: Self-pay

## 2021-10-24 NOTE — Chronic Care Management (AMB) (Signed)
? ? ?  Chronic Care Management ?Pharmacy Assistant  ? ?Name: Jeffrey Parsons  MRN: [id number] DOB: 03/07/1951 ? ?Reason for Encounter: Patient Assistance Coordination ? ?10/24/2021- Filling out medical portion on [employer] patient assistance application for Rybelsus 14 mg. Printing for provider to sign and will fax. ?  ?Medications: ?Outpatient Encounter Medications as of 10/24/2021  ?Medication Sig  ? acetaminophen (TYLENOL) 500 MG tablet Take 500 mg by mouth daily as needed for moderate pain or headache.   ? aspirin EC 81 MG tablet Take 1 tablet (81 mg total) by mouth daily.  ? carbidopa-levodopa (SINEMET CR) 50-200 MG tablet Take 1 tablet by mouth every evening.   ? carbidopa-levodopa (SINEMET IR) 25-250 MG tablet Take 1 tablet by mouth 4 (four) times daily.   ? carvedilol (COREG) 12.5 MG tablet Take 1 tablet (12.5 mg total) by mouth 2 (two) times daily.  ? furosemide (LASIX) 20 MG tablet Take 20 mg by mouth every other day.  ? insulin glargine (LANTUS) 100 UNIT/ML Solostar Pen Inject 18 Units into the skin daily. (Patient taking differently: Inject 15 Units into the skin daily.)  ? metFORMIN (GLUCOPHAGE) 500 MG tablet Take 1,000 mg by mouth 2 (two) times daily with a meal.  ? Multiple Vitamin (MULTIVITAMIN) tablet Take 1 tablet by mouth daily.  ? sacubitril-valsartan (ENTRESTO) 49-51 MG Take 1 tablet by mouth 2 (two) times daily.  ? Semaglutide 14 MG TABS Take 1 tablet (14 mg total) by mouth daily.  ? tamsulosin (FLOMAX) 0.4 MG CAPS capsule SMARTSIG:1 Capsule(s) By Mouth Every Evening  ? ?No facility-administered encounter medications on file as of 10/24/2021.  ? ? ?Billee Cashing, CMA ?Clinical Pharmacist Assistant ?[phone] ? ?

## 2021-10-25 NOTE — Progress Notes (Signed)
Remote ICD transmission.   

## 2021-10-27 DIAGNOSIS — I10 Essential (primary) hypertension: Secondary | ICD-10-CM

## 2021-10-27 DIAGNOSIS — E1169 Type 2 diabetes mellitus with other specified complication: Secondary | ICD-10-CM | POA: Diagnosis not present

## 2021-10-27 DIAGNOSIS — E782 Mixed hyperlipidemia: Secondary | ICD-10-CM

## 2021-10-28 NOTE — Progress Notes (Signed)
? ?Subjective:  ?Patient ID: Jeffrey Parsons, male    DOB: [birth year]  Age: 71 y.o. MRN: [id number] ? ?Chief Complaint  ?Patient presents with  ? Hypertension  ? Diabetes  ? Hyperlipidemia  ? ? ?HPI: chronic ?Diabetes: Patient takes Metformin 1000 mg twice a day, and Lantus 18 unit daily. ?He is on valsartan, he could not tolerate statins. ? ?Hypertension:  He takes  Aspirin 81 daily, Carvedilol 12.5 mg twice daily, Entresto 49-51 mg twice a day. ?Patient presents for follow up of hypertension.  Patient tolerating carvidilol, valsartan well with side effects.  Patient was diagnosed with hypertension 2010 so has been treated for hypertension for 12 years.Patient is working on maintaining diet and exercise regimen and follows up as directed. Complication include none.   ? ?10lb weight loss, semaglutide stopped in past.  He says he is eating. ? ?Parkinson disease, more shaking, to see neurologist next Wednesday. He is losing weight and he is having memory problems.  Forgets easily. ? ?Current Outpatient Medications on File Prior to Visit  ?Medication Sig Dispense Refill  ? aspirin EC 81 MG tablet Take 1 tablet (81 mg total) by mouth daily.    ? carbidopa-levodopa (SINEMET CR) 50-200 MG tablet Take 1 tablet by mouth every evening.     ? carbidopa-levodopa (SINEMET IR) 25-250 MG tablet Take 1 tablet by mouth 4 (four) times daily.     ? carvedilol (COREG) 12.5 MG tablet Take 1 tablet (12.5 mg total) by mouth 2 (two) times daily. 180 tablet 3  ? insulin glargine (LANTUS) 100 UNIT/ML Solostar Pen Inject 18 Units into the skin daily. (Patient taking differently: Inject 17 Units into the skin daily.) 18 mL 3  ? metFORMIN (GLUCOPHAGE) 500 MG tablet Take 1,000 mg by mouth 2 (two) times daily with a meal.    ? Multiple Vitamin (MULTIVITAMIN) tablet Take 1 tablet by mouth daily.    ? sacubitril-valsartan (ENTRESTO) 49-51 MG Take 1 tablet by mouth 2 (two) times daily. 180 tablet 3  ? Semaglutide 14 MG TABS Take 1 tablet (14 mg  total) by mouth daily. (Patient not taking: Reported on 10/29/2021) 30 tablet 3  ? tamsulosin (FLOMAX) 0.4 MG CAPS capsule SMARTSIG:1 Capsule(s) By Mouth Every Evening    ? ?No current facility-administered medications on file prior to visit.  ? ?Past Medical History:  ?Diagnosis Date  ? AICD (automatic cardioverter/defibrillator) present   ? Allergy to sulfa drugs 12/04/2016  ? Complete heart block (HCC) 05/26/2017  ? Coronary artery disease involving native coronary artery of native heart without angina pectoris 09/12/2016  ? Ischemic cardiomyopathy 05/26/2017  ? Pneumonia 2019  ? Retinopathy   ? ?Past Surgical History:  ?Procedure Laterality Date  ? BI-VENTRICULAR IMPLANTABLE CARDIOVERTER DEFIBRILLATOR UPGRADE  06/25/2018  ? BIV UPGRADE N/A 06/25/2018  ? Procedure: BIV ICD UPGRADE;  Surgeon: Duke Salvia, MD;  Location: [facility] INVASIVE CV LAB;  Service: Cardiovascular;  Laterality: N/A;  ? CARDIAC CATHETERIZATION  2001; ~ 2013  ? CATARACT EXTRACTION W/ INTRAOCULAR LENS  IMPLANT, BILATERAL Bilateral   ? CORONARY ARTERY BYPASS GRAFT  2001  ? "CABG X5"  ? INSERT / REPLACE / REMOVE PACEMAKER  05/2010  ? KNEE SURGERY Left   ? AS A CHILD  ? R shoulder surgery  05/2020  ? RIGHT/LEFT HEART CATH AND CORONARY/GRAFT ANGIOGRAPHY N/A 06/16/2017  ? Procedure: RIGHT/LEFT HEART CATH AND CORONARY/GRAFT ANGIOGRAPHY;  Surgeon: Tonny Bollman, MD;  Location: [facility] INVASIVE CV LAB;  Service: Cardiovascular;  Laterality: N/A;  ?  TONSILLECTOMY    ?  ?Family History  ?Problem Relation Age of Onset  ? Bronchitis Mother   ? Coronary artery disease Father   ? Heart disease Father   ? Hypertension Sister   ? Hypertension Brother   ? ?Social History  ? ?Socioeconomic History  ? Marital status: Married  ?  Spouse name: Not on file  ? Number of children: Not on file  ? Years of education: Not on file  ? Highest education level: Not on file  ?Occupational History  ? Not on file  ?Tobacco Use  ? Smoking status: Never  ? Smokeless tobacco: Never  ?Vaping  Use  ? Vaping Use: Never used  ?Substance and Sexual Activity  ? Alcohol use: Never  ? Drug use: Never  ? Sexual activity: Yes  ?Other Topics Concern  ? Not on file  ?Social History Narrative  ? Not on file  ? ?Social Determinants of Health  ? ?Financial Resource Strain: High Risk  ? Difficulty of Paying Living Expenses: Hard  ?Food Insecurity: Not on file  ?Transportation Needs: Not on file  ?Physical Activity: Not on file  ?Stress: Not on file  ?Social Connections: Not on file  ? ? ?Review of Systems  ?Constitutional:  Positive for fatigue. Negative for chills, fever and unexpected weight change.  ?HENT:  Positive for tinnitus. Negative for congestion, ear pain, sinus pain and sore throat.   ?Eyes:  Negative for visual disturbance.  ?Respiratory:  Negative for wheezing.   ?Cardiovascular:  Negative for chest pain and palpitations.  ?Gastrointestinal:  Negative for abdominal pain, blood in stool, constipation, diarrhea, nausea and vomiting.  ?Endocrine: Negative for polydipsia.  ?Genitourinary:  Negative for dysuria.  ?Musculoskeletal:  Negative for back pain.  ?Skin:  Negative for rash.  ?Neurological:  Negative for headaches.  ? ? ?Objective:  ?BP 98/70   Pulse 86   Temp 97.8 ?F (36.6 ?C)   Resp 15   Ht 5\' 7"  (1.702 m)   Wt 144 lb (65.3 kg)   SpO2 98%   BMI 22.55 kg/m?  ? ? ?  10/29/2021  ?  8:25 AM 08/29/2021  ?  1:49 PM 08/28/2021  ? 11:07 AM  ?BP/Weight  ?Systolic BP 98 100 110  ?Diastolic BP 70 50 60  ?Wt. (Lbs) 144 152 152  ?BMI 22.55 kg/m2 23.81 kg/m2 23.81 kg/m2  ? ? ?Physical Exam ?Vitals reviewed.  ?Constitutional:   ?   General: He is not in acute distress. ?   Appearance: Normal appearance.  ?HENT:  ?   Head: Normocephalic.  ?   Right Ear: Tympanic membrane normal.  ?   Left Ear: Tympanic membrane normal.  ?   Nose: Nose normal.  ?   Mouth/Throat:  ?   Mouth: Mucous membranes are moist.  ?Eyes:  ?   Conjunctiva/sclera: Conjunctivae normal.  ?   Pupils: Pupils are equal, round, and reactive to light.   ?Cardiovascular:  ?   Rate and Rhythm: Normal rate and regular rhythm.  ?   Pulses: Normal pulses.  ?   Heart sounds: Normal heart sounds. No murmur heard. ?  No gallop.  ?Pulmonary:  ?   Effort: Pulmonary effort is normal. No respiratory distress.  ?   Breath sounds: Normal breath sounds. No wheezing.  ?Abdominal:  ?   General: Abdomen is flat. Bowel sounds are normal. There is no distension.  ?   Palpations: Abdomen is soft.  ?   Tenderness: There is no abdominal  tenderness.  ?Musculoskeletal:     ?   General: Normal range of motion.  ?   Cervical back: Normal range of motion and neck supple.  ?   Right lower leg: No edema.  ?   Left lower leg: No edema.  ?   Comments: Muscle wasting  ?Skin: ?   General: Skin is warm.  ?   Capillary Refill: Capillary refill takes less than 2 seconds.  ?Neurological:  ?   General: No focal deficit present.  ?   Mental Status: He is alert and oriented to person, place, and time. Mental status is at baseline.  ?   Motor: Weakness present.  ?   Gait: Gait abnormal.  ?   Deep Tendon Reflexes: Reflexes normal.  ?   Comments: Tremor positive cogwheel  ?Psychiatric:     ?   Mood and Affect: Mood normal.     ?   Behavior: Behavior normal.     ?   Thought Content: Thought content normal.     ?   Judgment: Judgment normal.  ? ? ?Diabetic Foot Exam - Simple   ?Simple Foot Form ?Diabetic Foot exam was performed with the following findings: Yes 10/29/2021  8:56 AM  ?Visual Inspection ?See comments: Yes ?Sensation Testing ?See comments: Yes ?Pulse Check ?Posterior Tibialis and Dorsalis pulse intact bilaterally: Yes ?Comments ?Hammer toes and callus, decreased sensation ?  ?  ? ?Lab Results  ?Component Value Date  ? WBC 5.9 06/14/2021  ? HGB 13.1 06/14/2021  ? HCT 39.5 06/14/2021  ? PLT 199 06/14/2021  ? GLUCOSE 203 (H) 06/14/2021  ? CHOL 150 06/14/2021  ? TRIG 87 06/14/2021  ? HDL 48 06/14/2021  ? LDLCALC 86 06/14/2021  ? ALT 4 06/14/2021  ? AST 12 06/14/2021  ? NA 138 06/14/2021  ? K 4.8  06/14/2021  ? CL 98 06/14/2021  ? CREATININE 0.83 06/14/2021  ? BUN 19 06/14/2021  ? CO2 25 06/14/2021  ? TSH 2.320 11/23/2020  ? HGBA1C 8.6 (H) 06/14/2021  ? MICROALBUR 80 01/17/2020  ? ? ? ? ?Assessment & Plan:  ? ?

## 2021-10-29 ENCOUNTER — Ambulatory Visit (INDEPENDENT_AMBULATORY_CARE_PROVIDER_SITE_OTHER): Payer: Medicare Other | Admitting: Legal Medicine

## 2021-10-29 ENCOUNTER — Encounter: Payer: Self-pay | Admitting: Legal Medicine

## 2021-10-29 VITALS — BP 98/70 | HR 86 | Temp 97.8°F | Resp 15 | Ht 67.0 in | Wt 144.0 lb

## 2021-10-29 DIAGNOSIS — I152 Hypertension secondary to endocrine disorders: Secondary | ICD-10-CM

## 2021-10-29 DIAGNOSIS — E1169 Type 2 diabetes mellitus with other specified complication: Secondary | ICD-10-CM

## 2021-10-29 DIAGNOSIS — E1159 Type 2 diabetes mellitus with other circulatory complications: Secondary | ICD-10-CM | POA: Diagnosis not present

## 2021-10-29 DIAGNOSIS — T466X5A Adverse effect of antihyperlipidemic and antiarteriosclerotic drugs, initial encounter: Secondary | ICD-10-CM | POA: Insufficient documentation

## 2021-10-29 DIAGNOSIS — I10 Essential (primary) hypertension: Secondary | ICD-10-CM | POA: Diagnosis not present

## 2021-10-29 DIAGNOSIS — I2510 Atherosclerotic heart disease of native coronary artery without angina pectoris: Secondary | ICD-10-CM

## 2021-10-29 DIAGNOSIS — I700 Atherosclerosis of aorta: Secondary | ICD-10-CM

## 2021-10-29 DIAGNOSIS — I5042 Chronic combined systolic (congestive) and diastolic (congestive) heart failure: Secondary | ICD-10-CM

## 2021-10-29 DIAGNOSIS — M791 Myalgia, unspecified site: Secondary | ICD-10-CM | POA: Diagnosis not present

## 2021-10-29 DIAGNOSIS — N401 Enlarged prostate with lower urinary tract symptoms: Secondary | ICD-10-CM

## 2021-10-29 DIAGNOSIS — E669 Obesity, unspecified: Secondary | ICD-10-CM | POA: Diagnosis not present

## 2021-10-29 DIAGNOSIS — E782 Mixed hyperlipidemia: Secondary | ICD-10-CM

## 2021-10-29 DIAGNOSIS — E441 Mild protein-calorie malnutrition: Secondary | ICD-10-CM

## 2021-10-29 DIAGNOSIS — G20 Parkinson's disease: Secondary | ICD-10-CM | POA: Diagnosis not present

## 2021-10-29 DIAGNOSIS — N138 Other obstructive and reflux uropathy: Secondary | ICD-10-CM | POA: Diagnosis not present

## 2021-10-29 LAB — POCT URINALYSIS DIP (CLINITEK)
Blood, UA: NEGATIVE
Glucose, UA: NEGATIVE mg/dL
Leukocytes, UA: NEGATIVE
Nitrite, UA: NEGATIVE
POC PROTEIN,UA: 30 — AB
Spec Grav, UA: 1.015 (ref 1.010–1.025)
Urobilinogen, UA: 0.2 E.U./dL
pH, UA: 5 (ref 5.0–8.0)

## 2021-10-29 MED ORDER — EZETIMIBE 10 MG PO TABS: 10.0000 mg | ORAL_TABLET | Freq: Every day | ORAL | 3 refills | Status: DC

## 2021-10-30 LAB — COMPREHENSIVE METABOLIC PANEL
ALT: 4 IU/L (ref 0–44)
AST: 12 IU/L (ref 0–40)
Albumin/Globulin Ratio: 2 (ref 1.2–2.2)
Albumin: 4.4 g/dL (ref 3.7–4.7)
Alkaline Phosphatase: 103 IU/L (ref 44–121)
BUN/Creatinine Ratio: 22 (ref 10–24)
BUN: 18 mg/dL (ref 8–27)
Bilirubin Total: 0.8 mg/dL (ref 0.0–1.2)
CO2: 25 mmol/L (ref 20–29)
Calcium: 9.3 mg/dL (ref 8.6–10.2)
Chloride: 100 mmol/L (ref 96–106)
Creatinine, Ser: 0.83 mg/dL (ref 0.76–1.27)
Globulin, Total: 2.2 g/dL (ref 1.5–4.5)
Glucose: 194 mg/dL — ABNORMAL HIGH (ref 70–99)
Potassium: 5.1 mmol/L (ref 3.5–5.2)
Sodium: 138 mmol/L (ref 134–144)
Total Protein: 6.6 g/dL (ref 6.0–8.5)
eGFR: 94 mL/min/{1.73_m2} (ref >59)

## 2021-10-30 LAB — CBC WITH DIFFERENTIAL/PLATELET
Basophils Absolute: 0.1 10*3/uL (ref 0.0–0.2)
Basos: 1 %
EOS (ABSOLUTE): 0.4 10*3/uL (ref 0.0–0.4)
Eos: 7 %
Hematocrit: 38.4 % (ref 37.5–51.0)
Hemoglobin: 13.2 g/dL (ref 13.0–17.7)
Immature Grans (Abs): 0 10*3/uL (ref 0.0–0.1)
Immature Granulocytes: 0 %
Lymphocytes Absolute: 1.3 10*3/uL (ref 0.7–3.1)
Lymphs: 22 %
MCH: 31.2 pg (ref 26.6–33.0)
MCHC: 34.4 g/dL (ref 31.5–35.7)
MCV: 91 fL (ref 79–97)
Monocytes Absolute: 0.4 10*3/uL (ref 0.1–0.9)
Monocytes: 6 %
Neutrophils Absolute: 3.9 10*3/uL (ref 1.4–7.0)
Neutrophils: 64 %
Platelets: 191 10*3/uL (ref 150–450)
RBC: 4.23 x10E6/uL (ref 4.14–5.80)
RDW: 11.3 % — ABNORMAL LOW (ref 11.6–15.4)
WBC: 6 10*3/uL (ref 3.4–10.8)

## 2021-10-30 LAB — LIPID PANEL
Chol/HDL Ratio: 2.9 ratio (ref 0.0–5.0)
Cholesterol, Total: 149 mg/dL (ref 100–199)
HDL: 52 mg/dL (ref >39)
LDL Chol Calc (NIH): 85 mg/dL (ref 0–99)
Triglycerides: 61 mg/dL (ref 0–149)
VLDL Cholesterol Cal: 12 mg/dL (ref 5–40)

## 2021-10-30 LAB — HEMOGLOBIN A1C
Est. average glucose Bld gHb Est-mCnc: 186 mg/dL
Hgb A1c MFr Bld: 8.1 % — ABNORMAL HIGH (ref 4.8–5.6)

## 2021-10-30 LAB — CARDIOVASCULAR RISK ASSESSMENT

## 2021-10-30 LAB — MICROALBUMIN / CREATININE URINE RATIO
Creatinine, Urine: 78.9 mg/dL
Microalb/Creat Ratio: 10 mg/g creat (ref 0–29)
Microalbumin, Urine: 7.9 ug/mL

## 2021-10-30 NOTE — Progress Notes (Signed)
Microalbumin normal °lp

## 2021-10-30 NOTE — Progress Notes (Signed)
Glucose 194, kidney tests normal, liver tests normal, CBC normal, Cholesterol normal, A1c 8.1- use jardiance ?lp

## 2021-10-31 ENCOUNTER — Telehealth: Payer: Self-pay

## 2021-10-31 NOTE — Chronic Care Management (AMB) (Signed)
Application portion [employer] requested was faxed on 10/28/2021 by Jiles Crocker, office manager. ? ?Pattricia Boss, CMA ?Clinical Pharmacist Assistant ?[phone] ? ?

## 2021-11-06 ENCOUNTER — Other Ambulatory Visit: Payer: Self-pay | Admitting: Legal Medicine

## 2021-11-06 DIAGNOSIS — G250 Essential tremor: Secondary | ICD-10-CM | POA: Diagnosis not present

## 2021-11-06 DIAGNOSIS — G20 Parkinson's disease: Secondary | ICD-10-CM | POA: Diagnosis not present

## 2021-11-06 DIAGNOSIS — E1169 Type 2 diabetes mellitus with other specified complication: Secondary | ICD-10-CM

## 2021-11-12 ENCOUNTER — Telehealth: Payer: Self-pay

## 2021-11-12 MED ORDER — EMPAGLIFLOZIN 10 MG PO TABS: 10.0000 mg | ORAL_TABLET | Freq: Every day | ORAL | 0 refills | Status: DC

## 2021-11-12 NOTE — Telephone Encounter (Signed)
Patient was given jardiance 10 mg samples and now requests script. Script sent for patient.  ?Jeffrey Parsons 11/12/21 9:37 AM ? ?

## 2021-12-02 ENCOUNTER — Telehealth: Payer: Self-pay

## 2021-12-02 NOTE — Progress Notes (Signed)
Chronic Care Management Pharmacy Assistant   Name: Jeffrey Parsons  MRN: [id number] DOB: 06-30-1951   Reason for Encounter: Disease State call for DM    Recent office visits:  10/29/21 Jeffrey Bulla MD. Seen for CAD. Started on Ezetimibe 10mg  daily. D/C Acetaminophen 500mg  and Furosemide 20mg .   Recent consult visits:  11/06/21 (Neurology) MD. Seen for Parkinson's Disease. Amantadine 100 mg tabs. Please take 1 tab daily in the morning at 7am with your other medication for one week. If no side effects, increase to 1 twice daily at 7am and 11:30am  Hospital visits:  None  Medications: Outpatient Encounter Medications as of 12/02/2021  Medication Sig   aspirin EC 81 MG tablet Take 1 tablet (81 mg total) by mouth daily.   carbidopa-levodopa (SINEMET CR) 50-200 MG tablet Take 1 tablet by mouth every evening.    carbidopa-levodopa (SINEMET IR) 25-250 MG tablet Take 1 tablet by mouth 4 (four) times daily.    carvedilol (COREG) 12.5 MG tablet Take 1 tablet (12.5 mg total) by mouth 2 (two) times daily.   empagliflozin (JARDIANCE) 10 MG TABS tablet Take 1 tablet (10 mg total) by mouth daily before breakfast.   ezetimibe (ZETIA) 10 MG tablet Take 1 tablet (10 mg total) by mouth daily.   insulin glargine (LANTUS) 100 UNIT/ML Solostar Pen Inject 18 Units into the skin daily. (Patient taking differently: Inject 17 Units into the skin daily.)   metFORMIN (GLUCOPHAGE) 500 MG tablet TAKE 2 TABLETS (1,000 MG TOTAL) 2 (TWO) TIMES DAILY WITH A MEAL. START THIS WITH NEW INSULIN   Multiple Vitamin (MULTIVITAMIN) tablet Take 1 tablet by mouth daily.   sacubitril-valsartan (ENTRESTO) 49-51 MG Take 1 tablet by mouth 2 (two) times daily.   Semaglutide 14 MG TABS Take 1 tablet (14 mg total) by mouth daily. (Patient not taking: Reported on 10/29/2021)   tamsulosin (FLOMAX) 0.4 MG CAPS capsule SMARTSIG:1 Capsule(s) By Mouth Every Evening   No facility-administered encounter medications on file  as of 12/02/2021.    Recent Relevant Labs: Lab Results  Component Value Date/Time   HGBA1C 8.1 (H) 10/29/2021 09:16 AM   HGBA1C 8.6 (H) 06/14/2021 08:24 AM   MICROALBUR 80 01/17/2020 08:38 AM    Kidney Function Lab Results  Component Value Date/Time   CREATININE 0.83 10/29/2021 09:16 AM   CREATININE 0.83 06/14/2021 08:24 AM   GFRNONAA 76 05/03/2020 10:04 AM   GFRAA 87 05/03/2020 10:04 AM     Current antihyperglycemic regimen:  Jardiance 10mg  daily Metformin 500mg  2 tablets two times daily  Semaglutide 14mg  daily (Not taking, waiting on PAP) Lantus 100 units Inject 18 units daily-Taking 14 units daily  Patient verbally confirms he is taking the above medications as directed. Yes  What recent interventions/DTPs have been made to improve glycemic control:  No recent changes  Have there been any recent hospitalizations or ED visits since last visit with CPP? No  Patient denies hypoglycemic symptoms, including None  Patient denies hyperglycemic symptoms, including none  How often are you checking your blood sugar? 3-4 times daily  What are your blood sugars ranging?  Fasting: 11/30/21 149, 11/29/21 126, 11/28/21 108 11/27/21 83 11/26/21 58 in the middle of night After Meal 12/02/21 103  On insulin? Yes How many units: 14 units   During the week, how often does your blood glucose drop below 70? Once a week  Are you checking your feet daily/regularly? No  Adherence Review: Is the patient currently on a STATIN medication?  No Is the patient currently on ACE/ARB medication? Yes Does the patient have >5 day gap between last estimated fill dates? CPP to review  Care Gaps: Last eye exam / Retinopathy Screening? 02/13/21 Last Annual Wellness Visit? None noted  Last Diabetic Foot Exam? 10/29/21   Star Rating Drugs:  Medication:  Last Fill: Day Supply Metformin   11/06/21 90ds    08/09/21 90ds Jardiance   11/12/21 90ds Semaglutide  (PAP)  Roxana Hires, CMA Clinical Pharmacist  Assistant  [phone]

## 2021-12-09 DIAGNOSIS — G250 Essential tremor: Secondary | ICD-10-CM | POA: Diagnosis not present

## 2021-12-09 DIAGNOSIS — G20 Parkinson's disease: Secondary | ICD-10-CM | POA: Diagnosis not present

## 2021-12-09 DIAGNOSIS — Z4542 Encounter for adjustment and management of neuropacemaker (brain) (peripheral nerve) (spinal cord): Secondary | ICD-10-CM | POA: Diagnosis not present

## 2021-12-11 NOTE — Telephone Encounter (Signed)
Cut insulin to 15units a day, follow sugars, avoid hypoglycemia

## 2021-12-11 NOTE — Telephone Encounter (Signed)
Will let PCP know sugars are dropping low weekly

## 2021-12-11 NOTE — Telephone Encounter (Signed)
Patient is taking insulin 13 units now and his blood sugar is 120's. Dr Marina Goodell recommended to go down to 12 units, keep the log and he will call us back after his trip.

## 2021-12-27 DIAGNOSIS — S20211A Contusion of right front wall of thorax, initial encounter: Secondary | ICD-10-CM | POA: Diagnosis not present

## 2021-12-27 DIAGNOSIS — Z95810 Presence of automatic (implantable) cardiac defibrillator: Secondary | ICD-10-CM | POA: Diagnosis not present

## 2021-12-27 DIAGNOSIS — S2241XA Multiple fractures of ribs, right side, initial encounter for closed fracture: Secondary | ICD-10-CM | POA: Diagnosis not present

## 2021-12-27 DIAGNOSIS — W1830XA Fall on same level, unspecified, initial encounter: Secondary | ICD-10-CM | POA: Diagnosis not present

## 2021-12-27 DIAGNOSIS — S2231XA Fracture of one rib, right side, initial encounter for closed fracture: Secondary | ICD-10-CM | POA: Diagnosis not present

## 2022-01-08 ENCOUNTER — Ambulatory Visit (INDEPENDENT_AMBULATORY_CARE_PROVIDER_SITE_OTHER): Payer: Medicare Other

## 2022-01-08 DIAGNOSIS — I442 Atrioventricular block, complete: Secondary | ICD-10-CM

## 2022-01-08 LAB — CUP PACEART REMOTE DEVICE CHECK
Battery Remaining Longevity: 41 mo
Battery Voltage: 2.96 V
Brady Statistic AP VP Percent: 0.22 %
Brady Statistic AP VS Percent: 0.01 %
Brady Statistic AS VP Percent: 98.7 %
Brady Statistic AS VS Percent: 1.07 %
Brady Statistic RA Percent Paced: 0.23 %
Brady Statistic RV Percent Paced: 98.45 %
Date Time Interrogation Session: 20230712044224
HighPow Impedance: 57 Ohm
Implantable Lead Implant Date: 20111213
Implantable Lead Implant Date: 20191230
Implantable Lead Implant Date: 20191230
Implantable Lead Location: 753858
Implantable Lead Location: 753859
Implantable Lead Location: 753860
Implantable Lead Model: 5076
Implantable Pulse Generator Implant Date: 20191230
Lead Channel Impedance Value: 194.634
Lead Channel Impedance Value: 203.256
Lead Channel Impedance Value: 228 Ohm
Lead Channel Impedance Value: 234.706
Lead Channel Impedance Value: 247.358
Lead Channel Impedance Value: 342 Ohm
Lead Channel Impedance Value: 380 Ohm
Lead Channel Impedance Value: 380 Ohm
Lead Channel Impedance Value: 399 Ohm
Lead Channel Impedance Value: 437 Ohm
Lead Channel Impedance Value: 437 Ohm
Lead Channel Impedance Value: 570 Ohm
Lead Channel Impedance Value: 665 Ohm
Lead Channel Impedance Value: 703 Ohm
Lead Channel Impedance Value: 722 Ohm
Lead Channel Impedance Value: 893 Ohm
Lead Channel Impedance Value: 893 Ohm
Lead Channel Impedance Value: 893 Ohm
Lead Channel Pacing Threshold Amplitude: 0.625 V
Lead Channel Pacing Threshold Amplitude: 0.75 V
Lead Channel Pacing Threshold Amplitude: 1 V
Lead Channel Pacing Threshold Pulse Width: 0.4 ms
Lead Channel Pacing Threshold Pulse Width: 0.4 ms
Lead Channel Pacing Threshold Pulse Width: 0.4 ms
Lead Channel Sensing Intrinsic Amplitude: 17.125 mV
Lead Channel Sensing Intrinsic Amplitude: 17.125 mV
Lead Channel Sensing Intrinsic Amplitude: 3.25 mV
Lead Channel Sensing Intrinsic Amplitude: 3.25 mV
Lead Channel Setting Pacing Amplitude: 1.5 V
Lead Channel Setting Pacing Amplitude: 1.75 V
Lead Channel Setting Pacing Amplitude: 2 V
Lead Channel Setting Pacing Pulse Width: 0.4 ms
Lead Channel Setting Pacing Pulse Width: 0.4 ms
Lead Channel Setting Sensing Sensitivity: 0.45 mV

## 2022-01-16 ENCOUNTER — Ambulatory Visit (INDEPENDENT_AMBULATORY_CARE_PROVIDER_SITE_OTHER): Payer: Medicare Other

## 2022-01-16 DIAGNOSIS — I2510 Atherosclerotic heart disease of native coronary artery without angina pectoris: Secondary | ICD-10-CM

## 2022-01-16 DIAGNOSIS — E782 Mixed hyperlipidemia: Secondary | ICD-10-CM

## 2022-01-16 DIAGNOSIS — I10 Essential (primary) hypertension: Secondary | ICD-10-CM

## 2022-01-16 NOTE — Progress Notes (Signed)
Chronic Care Management Pharmacy Note  01/16/2022 Name:  Jeffrey Parsons MRN:  [id number] DOB:  [birth year]    Summary: Patient was reading Jeffrey Parsons, "Multiplying your God Given Potential" when I called him.   Recommendations:   Patient now on Empagliflozin. Cost $300 for him to pick up. Counseled to not do that in the future without calling me and getting him samples. Will conduct PAP ASAP. Stated he is having low sugars 4x/month. He states he commonly skips dinner. He takes his insulin in AM. Counseled to take insulin with dinner and if he skips his dinner, to decrease the insulin. Will let PCP know   Subjective: Jeffrey Parsons is an 71 y.o. year old male who is a primary patient of [name], Zeb Comfort, MD.  The CCM team was consulted for assistance with disease management and care coordination needs.    Engaged with patient by telephone for follow up visit in response to provider referral for pharmacy case management and/or care coordination services.   Consent to Services:  The patient was given information about Chronic Care Management services, agreed to services, and gave verbal consent prior to initiation of services.  Please see initial visit note for detailed documentation.   Patient Care Team: [name], MD as PCP - General (Family Medicine) Deboraha Sprang, MD as PCP - Electrophysiology (Cardiology) Richardo Priest, MD as PCP - Cardiology (Cardiology) Lane Hacker, [facility] (Pharmacist)  Recent office visits:  10/29/21 [name] MD. Seen for CAD. Started on Ezetimibe 80m daily. D/C Acetaminophen 5067mand Furosemide 2025m   Recent consult visits:  11/06/21 (Neurology) SklRegis [name]. Seen for Parkinson's Disease. Amantadine 100 mg tabs. Please take 1 tab daily in the morning at 7am with your other medication for one week. If no side effects, increase to 1 twice daily at 7am and 11:30am   Hospital visits:  None   Objective:  Lab  Results  Component Value Date   CREATININE 0.83 10/29/2021   BUN 18 10/29/2021   GFRNONAA 76 05/03/2020   GFRAA 87 05/03/2020   NA 138 10/29/2021   K 5.1 10/29/2021   CALCIUM 9.3 10/29/2021   CO2 25 10/29/2021    Lab Results  Component Value Date/Time   HGBA1C 8.1 (H) 10/29/2021 09:16 AM   HGBA1C 8.6 (H) 06/14/2021 08:24 AM   MICROALBUR 80 01/17/2020 08:38 AM    Last diabetic Eye exam:  Lab Results  Component Value Date/Time   HMDIABEYEEXA No Retinopathy 02/13/2021 12:00 AM    Last diabetic Foot exam: No results found for: "HMDIABFOOTEX"   Lab Results  Component Value Date   CHOL 149 10/29/2021   HDL 52 10/29/2021   LDLCALC 85 10/29/2021   TRIG 61 10/29/2021   CHOLHDL 2.9 10/29/2021       Latest Ref Rng & Units 10/29/2021    9:16 AM 06/14/2021    8:24 AM 11/23/2020    9:30 AM  Hepatic Function  Total Protein 6.0 - 8.5 g/dL 6.6  6.4  6.7   Albumin 3.7 - 4.7 g/dL 4.4  4.2  4.6   AST 0 - 40 IU/L _0 ALT 0 - 44 IU/L _1 Alk Phosphatase 44 - 121 IU/L 103  135  91   Total Bilirubin 0.0 - 1.2 mg/dL 0.8  0.8  1.0     Lab Results  Component Value Date/Time   TSH 2.320 11/23/2020 09:30 AM  TSH 1.440 05/22/2020 08:49 AM       Latest Ref Rng & Units 10/29/2021    9:16 AM 06/14/2021    8:24 AM 11/23/2020    9:30 AM  CBC  WBC 3.4 - 10.8 x10E3/uL 6.0  5.9  5.9   Hemoglobin 13.0 - 17.7 g/dL 13.2  13.1  13.9   Hematocrit 37.5 - 51.0 % 38.4  39.5  41.2   Platelets 150 - 450 x10E3/uL 191  199  172     No results found for: "VD25OH"  Clinical ASCVD: Yes  The ASCVD Risk score (Arnett DK, et al., 2019) failed to calculate for the following reasons:   The patient has a prior MI or stroke diagnosis       06/17/2021    8:59 AM 01/17/2020    8:30 AM  Depression screen PHQ 2/9  Decreased Interest 0 0  Down, Depressed, Hopeless 0 0  PHQ - 2 Score 0 0  Altered sleeping  0  Tired, decreased energy  1  Change in appetite  0  Feeling bad or failure about  yourself   0  Trouble concentrating  0  Moving slowly or fidgety/restless  1  Suicidal thoughts  0  PHQ-9 Score  2  Difficult doing work/chores  Not difficult at all     Other: (CHADS2VASc if Afib, MMRC or CAT for COPD, ACT, DEXA)  Social History   Tobacco Use  Smoking Status Never  Smokeless Tobacco Never   BP Readings from Last 3 Encounters:  10/29/21 98/70  08/29/21 (!) 100/50  08/28/21 110/60   Pulse Readings from Last 3 Encounters:  10/29/21 86  08/29/21 76  08/28/21 88   Wt Readings from Last 3 Encounters:  10/29/21 144 lb (65.3 kg)  08/29/21 152 lb (68.9 kg)  08/28/21 152 lb (68.9 kg)    Assessment/Interventions: Review of patient past medical history, allergies, medications, health status, including review of consultants reports, laboratory and other test data, was performed as part of comprehensive evaluation and provision of chronic care management services.   SDOH:  (Social Determinants of Health) assessments and interventions performed: Yes SDOH Interventions    Flowsheet Row Most Recent Value  SDOH Interventions   Financial Strain Interventions Other (Comment)  [PAP]  Physical Activity Interventions Intervention Not Indicated        CCM Care Plan  Allergies  Allergen Reactions   Farxiga [Dapagliflozin] Other (See Comments)    Patient lost weight.   Onion     Upset stomach   Sulfa Antibiotics Other (See Comments)    FLUSH     Medications Reviewed Today     Reviewed by [name], MD (Physician) on 10/29/21 at [phone]  Med List Status: <None>   Medication Order Taking? Sig Documenting Provider Last Dose Status Informant  aspirin EC 81 MG tablet [id number] Yes Take 1 tablet (81 mg total) by mouth daily. Richardson Dopp T, PA-C Taking Active Multiple Informants  carbidopa-levodopa (SINEMET CR) 50-200 MG tablet [id number] Yes Take 1 tablet by mouth every evening.  [provider] Taking Active Multiple Informants   carbidopa-levodopa (SINEMET IR) 25-250 MG tablet [id number] Yes Take 1 tablet by mouth 4 (four) times daily.  [provider] Taking Active Multiple Informants  carvedilol (COREG) 12.5 MG tablet [id number] Yes Take 1 tablet (12.5 mg total) by mouth 2 (two) times daily. Richardo Priest, MD Taking Active   insulin glargine (LANTUS) 100 UNIT/ML Solostar Pen [id number] Yes Inject 18 Units into the  skin daily.  Patient taking differently: Inject 17 Units into the skin daily.   Cox, Kirsten, MD Taking Active   metFORMIN (GLUCOPHAGE) 500 MG tablet [id number] Yes Take 1,000 mg by mouth 2 (two) times daily with a meal. [provider] Taking Active   Multiple Vitamin (MULTIVITAMIN) tablet [id number] Yes Take 1 tablet by mouth daily. [provider] Taking Active Multiple Informants  sacubitril-valsartan (ENTRESTO) 49-51 MG [id number] Yes Take 1 tablet by mouth 2 (two) times daily. Nahser, Wonda Cheng, MD Taking Active Multiple Informants  Semaglutide 14 MG TABS [id number] No Take 1 tablet (14 mg total) by mouth daily.  Patient not taking: Reported on 10/29/2021   [name], MD Not Taking Active   tamsulosin [facility]) 0.4 MG CAPS capsule [id number] No SMARTSIG:1 Capsule(s) By Mouth Every Evening [provider] Unknown Active             Patient Active Problem List   Diagnosis Date Noted   Myalgia due to HMG CoA reductase inhibitor 10/29/2021   Malnutrition of mild degree ([place]) 10/29/2021   Sepsis ([place]) 08/28/2021   Retinopathy 08/26/2021   Atherosclerosis of aorta ([place]) 01/10/2021   Abnormal weight loss 11/23/2020   BPH with obstruction/lower urinary tract symptoms 05/22/2020   BMI 22.0-22.9, adult 01/17/2020   Sick sinus syndrome ([place]) 09/13/2019   Obesity, diabetes, and hypertension syndrome ([place]) 09/13/2019   Secondary dystonia 07/04/2019   Essential hypertension 06/26/2018   Complete heart block (HCC) 05/26/2017   Cardiac device in situ 05/26/2017    Chronic combined systolic and diastolic CHF (congestive heart failure) ([place]) 05/26/2017   Ischemic cardiomyopathy 05/26/2017   AICD (automatic cardioverter/defibrillator) present 12/04/2016   S/P CABG x 5 09/12/2016   Mixed hyperlipidemia 09/12/2016   Coronary artery disease involving native coronary artery of native heart without angina pectoris 09/12/2016   Pacemaker 06/06/2016   Long term (current) use of aspirin 12/06/2015   Mild obesity 12/06/2015   Other long term (current) drug therapy 12/06/2015   Parkinson's disease ([place]) 12/07/2014    Immunization History  Administered Date(s) Administered   Moderna Sars-Covid-2 Vaccination 08/03/2019, 08/31/2019    Conditions to be addressed/monitored:  Hyperlipidemia and Diabetes  Care Plan : [place]  Updates made by Lane Hacker, RPH since 01/16/2022 12:00 AM     Problem: CAD, Diabetes, Hypertension   Priority: High  Onset Date: 08/07/2020     Long-Range Goal: Disease Management   Start Date: 08/07/2020  Expected End Date: 08/07/2021  Recent Progress: On track  Priority: High  Note:   Current Barriers:  Unable to achieve control of Diabetes   Pharmacist Clinical Goal(s):  Patient will achieve control of diabetes as evidenced by a1c and blood sugar readings through collaboration with PharmD and provider.   Interventions: 1:1 collaboration with [name], MD regarding development and update of comprehensive plan of care as evidenced by provider attestation and co-signature Inter-disciplinary care team collaboration (see longitudinal plan of care) Comprehensive medication review performed; medication list updated in electronic medical record  CAD: (LDL goal < 55) -Managed by Dr. Bettina Gavia The ASCVD Risk score (Arnett DK, et al., 2019) failed to calculate for the following reasons:   The patient has a prior MI or stroke diagnosis Lab Results  Component Value Date   CHOL 149 10/29/2021   CHOL 150  06/14/2021   CHOL 135 11/23/2020   Lab Results  Component Value Date   HDL 52 10/29/2021   HDL 48 06/14/2021  HDL 45 11/23/2020   Lab Results  Component Value Date   LDLCALC 85 10/29/2021   LDLCALC 86 06/14/2021   LDLCALC 69 11/23/2020   Lab Results  Component Value Date   TRIG 61 10/29/2021   TRIG 87 06/14/2021   TRIG 114 11/23/2020   Lab Results  Component Value Date   CHOLHDL 2.9 10/29/2021   CHOLHDL 3.1 06/14/2021   CHOLHDL 3.0 11/23/2020  No results found for: "LDLDIRECT" -Controlled -Current treatment:  rosuvastatin 10 mg daily Appropriate, Effective, Safe, Accessible -Medications previously tried: atorvastatin  -Current dietary patterns: eating healthy and trying to adhere to diabetic diet -Current exercise habits: [employer] 5 days a week  -Educated on Cholesterol goals;  Benefits of statin for ASCVD risk reduction; Importance of limiting foods high in cholesterol; Exercise goal of 150 minutes per week; -Counseled on diet and exercise extensively Recommend continuing current medication   Diabetes (A1c goal <7.5%) Lab Results  Component Value Date   HGBA1C 8.1 (H) 10/29/2021   HGBA1C 8.6 (H) 06/14/2021   HGBA1C 8.5 (H) 11/23/2020   Lab Results  Component Value Date   MICROALBUR 80 01/17/2020   LDLCALC 85 10/29/2021   CREATININE 0.83 10/29/2021   Lab Results  Component Value Date   NA 138 10/29/2021   K 5.1 10/29/2021   CREATININE 0.83 10/29/2021   EGFR 94 10/29/2021   GFRNONAA 76 05/03/2020   GLUCOSE 194 (H) 10/29/2021   Lab Results  Component Value Date   WBC 6.0 10/29/2021   HGB 13.2 10/29/2021   HCT 38.4 10/29/2021   MCV 91 10/29/2021   PLT 191 10/29/2021  -Uncontrolled -Current medications: Lantus 12 SS units AM Appropriate, Query effective, Query Safe, Accessible Metformin 1000 mg bid Appropriate, Query effective, Query Safe, Accessible Freestyle Libre (PAP) Appropriate, Effective, Safe, Accessible Insulin pen needles  Appropriate, Effective, Safe, Accessible Empagliflozin Appropriate, Effective, Safe, Query accessible -Medications previously tried: 70/30 insulin, Rybelsus (Cost) -Current home glucose readings fasting glucose:  October 2022: 64-152 mg/dL Jan 2023: 50-220 March 2023: Didn't have list due to recent infection April 2023: Normally 80-140 but has had a 65 and a 77 July 2023: 4 readings below 70 this month.  post prandial glucose: 140-225 mg/dL -Reports hypoglycemic/hyperglycemic symptoms -Current meal patterns:  breakfast: eggs lunch: trying to eat balanced meals dinner: meat and vegetables snacks: ensure/boost, peanut butter crackers, pretzels drinks: juice to correct low blood sugar -Current exercise:  [employer] m-f when wife can drive and pt for shoulder/parkinson's -Educated on A1c and blood sugar goals; Complications of diabetes including kidney damage, retinal damage, and cardiovascular disease; Exercise goal of 150 minutes per week: July 2023: Exercising 50 minutes 5x/week on stationary bike Prevention and management of hypoglycemic episodes; Continuous glucose monitoring; -Counseled to check feet daily and get yearly eye exams -Counseled on diet and exercise extensively August 2022: Recommended patient continue to watch blood sugar closely. Patient to resume Farxiga 5 mg daily with Lantus 10 mg. Patient will call pharmacist with any concerns or questions. Pharmacist sending order for Farxiga 5 mg to [facility].  October 2022: Patient stopped Farxiga and sugars are swinging too high and low. He states that with his dose of insulin in the morning he is chasing it all day and sometimes the dose is too strong and he drops too low at night. There are a few options,  1: Add Short acting insulin so he isn't running hypo multiple times during the day due to the lantus B: Replace lantus with GLP (Which  has less hypoglycemia). Then taper down insulin.  Will ask PCP if option B is  agreeable, if so, will start PAP ASAP because his insurance won't cover it -Also, Libre PAP needs to be renewed, he told me the company will be reaching out to [place] so we can fax his note Hx Jan 2023: Sugars are too high and too low per patient. Below 80 5x/month but sometimes is in 200's as well. Tends to range 80-150. Dr. [name] gave patient Rybelsus last month but patient never started due to fear of ADR's. Counseled extensively about risks/rewards, he agreed to try it for 2 weeks. F/U then and if he's content on therapy, will start PAP for it. Also counsled that he may have to decrease insulin after starting new med and to call us if he drops too low so we can adjust insulin Feb 2023: Patient content on Rybelsus but sugars are still low. Called Dr. [name], spoke with Lemmie Evens who gave verbal from Dr. [name] to decrease Lantus to 15 units. Patient will call if he goes low still. Started Rybelsus PAP March 2023: Waiting on Rybelsus PAP to come in, gave phone number for them to call 09/16/21. Due to recent sickness, patient hasn't adjusted sugars too much. F/U 1 month, hopefully PAP will be in by then April 2023: Has had a few Hypoglycemic episodes. Will let PCP know. Patient wife called for Rybelsus PAP, wife said company said something was missing. She was sleeping and didn't call the office to figure out how to fix this. Asked patient to have wife call ASAP July 2023: Patient now on Empagliflozin. Cost $300 for him to pick up. Counseled to not do that in the future without calling me and getting him samples. Will conduct PAP ASAP. Stated he is having low sugars 4x/month. He states he commonly skips dinner. He takes his insulin in AM. Counseled to take insulin with dinner and if he skips his dinner, to decrease the insulin. Will let PCP know  CHF -Managed by Dr. Bettina Gavia -ejection fraction December 2021 severely reduced 30 to 35% BP Readings from Last 3 Encounters:  10/29/21 98/70  08/29/21 (!) 100/50   08/28/21 110/60  -Managed by Cardio -Controlled -Current treatment  Entresto 49/51 (PAP) Appropriate, Effective, Safe, Accessible Furosemide PRN 91m Appropriate, Effective, Safe, Accessible -Medications previously tried: N/A  -Recommended to continue current medication  Parkinson (Goal: Slow progression) -Controlled -Current treatment  Carb/Lev 50-200 HS Appropriate, Effective, Safe, Accessible Carb/Lev 25/250 IR QID Appropriate, Effective, Safe, Accessible Amantadine 1063mAppropriate, Effective, Safe, Accessible -Medications previously tried: N/A  -Recommended to continue current medication   Patient Goals/Self-Care Activities Patient will:  - take medications as prescribed focus on medication adherence by using pill box check glucose throughout the [facility], and provide at future appointments target a minimum of 150 minutes of moderate intensity exercise weekly  Follow Up Plan: Telephone follow up appointment with care management team member scheduled for: November 2023  NaArizona ConstablePharm.D. - [phone]         Medication Assistance:  EnDelene Lollnd Lantus obtained through [employer] medication assistance program.  Enrollment ends 06/29/2022   Rybelsus: Dnied Empagliflozin: Application started July 2023   Patient's [facility] pharmacy is:  [id number] [place] [address] [id number]: [phone]: [address] [place]  [place] [address] Phone: [phone] Fax: [phone]   Uses pill box? Yes Pt endorses excellent compliance  We discussed: Benefits of medication synchronization, packaging and delivery as well as enhanced pharmacist oversight with [employer]. Patient decided to: Continue current medication management strategy  Care Plan and Follow Up Patient Decision:   Patient agrees to Care Plan and Follow-up.  Plan: Telephone follow up appointment with care management team member scheduled for:  November 2023  [place] Constable, [place].D. - [phone]

## 2022-01-16 NOTE — Patient Instructions (Signed)
Visit Information   Goals Addressed   None    Patient Care Plan: CCM Pharmacy Care Plan     Problem Identified: CAD, Diabetes, Hypertension   Priority: High  Onset Date: 08/07/2020     Long-Range Goal: Disease Management   Start Date: 08/07/2020  Expected End Date: 08/07/2021  Recent Progress: On track  Priority: High  Note:   Current Barriers:  Unable to achieve control of Diabetes   Pharmacist Clinical Goal(s):  Patient will achieve control of diabetes as evidenced by a1c and blood sugar readings through collaboration with PharmD and provider.   Interventions: 1:1 collaboration with [name], MD regarding development and update of comprehensive plan of care as evidenced by provider attestation and co-signature Inter-disciplinary care team collaboration (see longitudinal plan of care) Comprehensive medication review performed; medication list updated in electronic medical record  CAD: (LDL goal < 55) -Managed by Dr. Bettina Gavia The ASCVD Risk score (Arnett DK, et al., 2019) failed to calculate for the following reasons:   The patient has a prior MI or stroke diagnosis Lab Results  Component Value Date   CHOL 149 10/29/2021   CHOL 150 06/14/2021   CHOL 135 11/23/2020   Lab Results  Component Value Date   HDL 52 10/29/2021   HDL 48 06/14/2021   HDL 45 11/23/2020   Lab Results  Component Value Date   LDLCALC 85 10/29/2021   LDLCALC 86 06/14/2021   LDLCALC 69 11/23/2020   Lab Results  Component Value Date   TRIG 61 10/29/2021   TRIG 87 06/14/2021   TRIG 114 11/23/2020   Lab Results  Component Value Date   CHOLHDL 2.9 10/29/2021   CHOLHDL 3.1 06/14/2021   CHOLHDL 3.0 11/23/2020  No results found for: "LDLDIRECT" -Controlled -Current treatment:  rosuvastatin 10 mg daily Appropriate, Effective, Safe, Accessible -Medications previously tried: atorvastatin  -Current dietary patterns: eating healthy and trying to adhere to diabetic diet -Current  exercise habits: [employer] 5 days a week  -Educated on Cholesterol goals;  Benefits of statin for ASCVD risk reduction; Importance of limiting foods high in cholesterol; Exercise goal of 150 minutes per week; -Counseled on diet and exercise extensively Recommend continuing current medication   Diabetes (A1c goal <7.5%) Lab Results  Component Value Date   HGBA1C 8.1 (H) 10/29/2021   HGBA1C 8.6 (H) 06/14/2021   HGBA1C 8.5 (H) 11/23/2020   Lab Results  Component Value Date   MICROALBUR 80 01/17/2020   LDLCALC 85 10/29/2021   CREATININE 0.83 10/29/2021   Lab Results  Component Value Date   NA 138 10/29/2021   K 5.1 10/29/2021   CREATININE 0.83 10/29/2021   EGFR 94 10/29/2021   GFRNONAA 76 05/03/2020   GLUCOSE 194 (H) 10/29/2021   Lab Results  Component Value Date   WBC 6.0 10/29/2021   HGB 13.2 10/29/2021   HCT 38.4 10/29/2021   MCV 91 10/29/2021   PLT 191 10/29/2021  -Uncontrolled -Current medications: Lantus 12 SS units AM Appropriate, Query effective, Query Safe, Accessible Metformin 1000 mg bid Appropriate, Query effective, Query Safe, Accessible Freestyle Libre (PAP) Appropriate, Effective, Safe, Accessible Insulin pen needles Appropriate, Effective, Safe, Accessible Empagliflozin Appropriate, Effective, Safe, Query accessible -Medications previously tried: 70/30 insulin, Rybelsus (Cost) -Current home glucose readings fasting glucose:  October 2022: 64-152 mg/dL Jan 2023: 50-220 March 2023: Didn't have list due to recent infection April 2023: Normally 80-140 but has had a 27 and a 77 July 2023: 4 readings below 70 this month.  post prandial glucose: 140-225 mg/dL -Reports hypoglycemic/hyperglycemic symptoms -Current meal patterns:  breakfast: eggs lunch: trying to eat balanced meals dinner: meat and vegetables snacks: ensure/boost, peanut butter crackers, pretzels drinks: juice to correct low blood sugar -Current exercise:  [employer] m-f when  wife can drive and pt for shoulder/parkinson's -Educated on A1c and blood sugar goals; Complications of diabetes including kidney damage, retinal damage, and cardiovascular disease; Exercise goal of 150 minutes per week: July 2023: Exercising 50 minutes 5x/week on stationary bike Prevention and management of hypoglycemic episodes; Continuous glucose monitoring; -Counseled to check feet daily and get yearly eye exams -Counseled on diet and exercise extensively August 2022: Recommended patient continue to watch blood sugar closely. Patient to resume Farxiga 5 mg daily with Lantus 10 mg. Patient will call pharmacist with any concerns or questions. Pharmacist sending order for Farxiga 5 mg to [facility].  October 2022: Patient stopped Farxiga and sugars are swinging too high and low. He states that with his dose of insulin in the morning he is chasing it all day and sometimes the dose is too strong and he drops too low at night. There are a few options,  1: Add Short acting insulin so he isn't running hypo multiple times during the day due to the lantus B: Replace lantus with GLP (Which has less hypoglycemia). Then taper down insulin.  Will ask PCP if option B is agreeable, if so, will start PAP ASAP because his insurance won't cover it -Also, Libre PAP needs to be renewed, he told me the company will be reaching out to [place] so we can fax his note Hx Jan 2023: Sugars are too high and too low per patient. Below 80 5x/month but sometimes is in 200's as well. Tends to range 80-150. Dr. [name] gave patient Rybelsus last month but patient never started due to fear of ADR's. Counseled extensively about risks/rewards, he agreed to try it for 2 weeks. F/U then and if he's content on therapy, will start PAP for it. Also counsled that he may have to decrease insulin after starting new med and to call us if he drops too low so we can adjust insulin Feb 2023: Patient content on Rybelsus but sugars are still low. Called  Dr. [name], spoke with Lemmie Evens who gave verbal from Dr. [name] to decrease Lantus to 15 units. Patient will call if he goes low still. Started Rybelsus PAP March 2023: Waiting on Rybelsus PAP to come in, gave phone number for them to call 09/16/21. Due to recent sickness, patient hasn't adjusted sugars too much. F/U 1 month, hopefully PAP will be in by then April 2023: Has had a few Hypoglycemic episodes. Will let PCP know. Patient wife called for Rybelsus PAP, wife said company said something was missing. She was sleeping and didn't call the office to figure out how to fix this. Asked patient to have wife call ASAP July 2023: Patient now on Empagliflozin. Cost $300 for him to pick up. Counseled to not do that in the future without calling me and getting him samples. Will conduct PAP ASAP. Stated he is having low sugars 4x/month. He states he commonly skips dinner. He takes his insulin in AM. Counseled to take insulin with dinner and if he skips his dinner, to decrease the insulin. Will let PCP know  CHF -Managed by Dr. Bettina Gavia -ejection fraction December 2021 severely reduced 30 to 35% BP Readings from Last 3 Encounters:  10/29/21 98/70  08/29/21 (!) 100/50  08/28/21 110/60  -  Managed by Cardio -Controlled -Current treatment  Entresto 49/51 (PAP) Appropriate, Effective, Safe, Accessible Furosemide PRN 55m Appropriate, Effective, Safe, Accessible -Medications previously tried: N/A  -Recommended to continue current medication  Parkinson (Goal: Slow progression) -Controlled -Current treatment  Carb/Lev 50-200 HS Appropriate, Effective, Safe, Accessible Carb/Lev 25/250 IR QID Appropriate, Effective, Safe, Accessible Amantadine 1062mAppropriate, Effective, Safe, Accessible -Medications previously tried: N/A  -Recommended to continue current medication   Patient Goals/Self-Care Activities Patient will:  - take medications as prescribed focus on medication adherence by using pill box check  glucose throughout the day [facility], and provide at future appointments target a minimum of 150 minutes of moderate intensity exercise weekly  Follow Up Plan: Telephone follow up appointment with care management team member scheduled for: November 2023  [facility] ConstablePharm.D. - - [phone]    Mr. WiRenovatoas given information about Chronic Care Management services today including:  CCM service includes personalized support from designated clinical staff supervised by his physician, including individualized plan of care and coordination with other care providers 24/7 contact phone numbers for assistance for urgent and routine care needs. Standard insurance, coinsurance, copays and deductibles apply for chronic care management only during months in which we provide at least 20 minutes of these services. Most insurances cover these services at 100%, however patients may be responsible for any copay, coinsurance and/or deductible if applicable. This service may help you avoid the need for more expensive face-to-face services. Only one practitioner may furnish and bill the service in a calendar month. The patient may stop CCM services at any time (effective at the end of the month) by phone call to the office staff.  Patient agreed to services and verbal consent obtained.   The patient verbalized understanding of instructions, educational materials, and care plan provided today and DECLINED offer to receive copy of patient instructions, educational materials, and care plan.  The pharmacy team will reach out to the patient again over the next 90 days.   NaLane HackerRPJAARS

## 2022-01-27 DIAGNOSIS — E782 Mixed hyperlipidemia: Secondary | ICD-10-CM | POA: Diagnosis not present

## 2022-01-27 DIAGNOSIS — I10 Essential (primary) hypertension: Secondary | ICD-10-CM | POA: Diagnosis not present

## 2022-01-27 DIAGNOSIS — I2510 Atherosclerotic heart disease of native coronary artery without angina pectoris: Secondary | ICD-10-CM

## 2022-01-29 NOTE — Progress Notes (Signed)
Remote ICD transmission.   

## 2022-01-30 ENCOUNTER — Telehealth: Payer: Self-pay

## 2022-01-30 NOTE — Progress Notes (Signed)
Chronic Care Management Pharmacy Assistant   Name: Jeffrey Parsons  MRN: [id number] DOB: March 21, 1951   Reason for Encounter: Disease State call for DM    Recent office visits:  None  Recent consult visits:  None  Hospital visits:  None  Medications: Outpatient Encounter Medications as of 01/30/2022  Medication Sig   Amantadine HCl 100 MG tablet Take 100 mg by mouth 2 (two) times daily.   aspirin EC 81 MG tablet Take 1 tablet (81 mg total) by mouth daily.   carbidopa-levodopa (SINEMET CR) 50-200 MG tablet Take 1 tablet by mouth every evening.    carbidopa-levodopa (SINEMET IR) 25-250 MG tablet Take 1 tablet by mouth 4 (four) times daily.    carvedilol (COREG) 12.5 MG tablet Take 1 tablet (12.5 mg total) by mouth 2 (two) times daily.   empagliflozin (JARDIANCE) 10 MG TABS tablet Take 1 tablet (10 mg total) by mouth daily before breakfast.   ezetimibe (ZETIA) 10 MG tablet Take 1 tablet (10 mg total) by mouth daily.   insulin glargine (LANTUS) 100 UNIT/ML Solostar Pen Inject 18 Units into the skin daily. (Patient taking differently: Inject 17 Units into the skin daily.)   metFORMIN (GLUCOPHAGE) 500 MG tablet TAKE 2 TABLETS (1,000 MG TOTAL) 2 (TWO) TIMES DAILY WITH A MEAL. START THIS WITH NEW INSULIN   Multiple Vitamin (MULTIVITAMIN) tablet Take 1 tablet by mouth daily.   sacubitril-valsartan (ENTRESTO) 49-51 MG Take 1 tablet by mouth 2 (two) times daily.   Semaglutide 14 MG TABS Take 1 tablet (14 mg total) by mouth daily. (Patient not taking: Reported on 10/29/2021)   spironolactone (ALDACTONE) 25 MG tablet Take 0.5 tablets by mouth daily.   tamsulosin (FLOMAX) 0.4 MG CAPS capsule SMARTSIG:1 Capsule(s) By Mouth Every Evening   No facility-administered encounter medications on file as of 01/30/2022.    Recent Relevant Labs: Lab Results  Component Value Date/Time   HGBA1C 8.1 (H) 10/29/2021 09:16 AM   HGBA1C 8.6 (H) 06/14/2021 08:24 AM   MICROALBUR 80 01/17/2020 08:38 AM     Kidney Function Lab Results  Component Value Date/Time   CREATININE 0.83 10/29/2021 09:16 AM   CREATININE 0.83 06/14/2021 08:24 AM   GFRNONAA 76 05/03/2020 10:04 AM   GFRAA 87 05/03/2020 10:04 AM     Current antihyperglycemic regimen:  Lantus 10-14 SS units PM Metformin 1000 mg bid  Empagliflozin 10mg  daily  Patient verbally confirms he is taking the above medications as directed. Yes  What recent interventions/DTPs have been made to improve glycemic control:  Changed Lantus to take at 7:00pm  Have there been any recent hospitalizations or ED visits since last visit with CPP? No  Patient denies hypoglycemic symptoms, including None  Patient denies hyperglycemic symptoms, including none  How often are you checking your blood sugar? 3-4 times daily  What are your blood sugars ranging?  Fasting: 01/29/22 76, 01/30/22 87 After Meal :01/29/22 161, 203, 01/30/22 161, 101 During the night  01/30/22 77, 56, 01/28/22 56 01/25/22 64  On insulin? Yes How many units:10-14 units   During the week, how often does your blood glucose drop below 70?  4 times  during the night  Are you checking your feet daily/regularly? Yes  Adherence Review: Is the patient currently on a STATIN medication? No Is the patient currently on ACE/ARB medication? Yes Does the patient have >5 day gap between last estimated fill dates? CPP to review  Care Gaps: Last eye exam / Retinopathy Screening? 02/13/21 Last Annual Wellness  Visit? None noted  Last Diabetic Foot Exam? 10/29/21   Star Rating Drugs:  Medication:  Last Fill: Day Supply Metformin   11/06/21-08/09/21 90ds Empagliflozin   11/12/21 90ds   Roxana Hires, CMA Clinical Pharmacist Assistant / [phone]

## 2022-01-31 ENCOUNTER — Other Ambulatory Visit: Payer: Medicare Other

## 2022-01-31 DIAGNOSIS — E782 Mixed hyperlipidemia: Secondary | ICD-10-CM | POA: Diagnosis not present

## 2022-01-31 DIAGNOSIS — I10 Essential (primary) hypertension: Secondary | ICD-10-CM

## 2022-01-31 DIAGNOSIS — E1169 Type 2 diabetes mellitus with other specified complication: Secondary | ICD-10-CM

## 2022-02-01 LAB — CBC WITH DIFF/PLATELET
Basophils Absolute: 0.1 10*3/uL (ref 0.0–0.2)
Basos: 1 %
EOS (ABSOLUTE): 0.4 10*3/uL (ref 0.0–0.4)
Eos: 6 %
Hematocrit: 40.9 % (ref 37.5–51.0)
Hemoglobin: 14.1 g/dL (ref 13.0–17.7)
Immature Grans (Abs): 0 10*3/uL (ref 0.0–0.1)
Immature Granulocytes: 1 %
Lymphocytes Absolute: 1.4 10*3/uL (ref 0.7–3.1)
Lymphs: 21 %
MCH: 32 pg (ref 26.6–33.0)
MCHC: 34.5 g/dL (ref 31.5–35.7)
MCV: 93 fL (ref 79–97)
Monocytes Absolute: 0.3 10*3/uL (ref 0.1–0.9)
Monocytes: 5 %
Neutrophils Absolute: 4.3 10*3/uL (ref 1.4–7.0)
Neutrophils: 66 %
Platelets: 187 10*3/uL (ref 150–450)
RBC: 4.4 x10E6/uL (ref 4.14–5.80)
RDW: 12.2 % (ref 11.6–15.4)
WBC: 6.5 10*3/uL (ref 3.4–10.8)

## 2022-02-01 LAB — COMPREHENSIVE METABOLIC PANEL
ALT: 4 IU/L (ref 0–44)
AST: 13 IU/L (ref 0–40)
Albumin/Globulin Ratio: 2 (ref 1.2–2.2)
Albumin: 4.5 g/dL (ref 3.8–4.8)
Alkaline Phosphatase: 103 IU/L (ref 44–121)
BUN/Creatinine Ratio: 27 — ABNORMAL HIGH (ref 10–24)
BUN: 28 mg/dL — ABNORMAL HIGH (ref 8–27)
Bilirubin Total: 1.1 mg/dL (ref 0.0–1.2)
CO2: 22 mmol/L (ref 20–29)
Calcium: 9 mg/dL (ref 8.6–10.2)
Chloride: 96 mmol/L (ref 96–106)
Creatinine, Ser: 1.02 mg/dL (ref 0.76–1.27)
Globulin, Total: 2.2 g/dL (ref 1.5–4.5)
Glucose: 213 mg/dL — ABNORMAL HIGH (ref 70–99)
Potassium: 4.6 mmol/L (ref 3.5–5.2)
Sodium: 134 mmol/L (ref 134–144)
Total Protein: 6.7 g/dL (ref 6.0–8.5)
eGFR: 79 mL/min/{1.73_m2} (ref >59)

## 2022-02-01 LAB — LIPID PANEL
Chol/HDL Ratio: 4 ratio (ref 0.0–5.0)
Cholesterol, Total: 181 mg/dL (ref 100–199)
HDL: 45 mg/dL (ref >39)
LDL Chol Calc (NIH): 117 mg/dL — ABNORMAL HIGH (ref 0–99)
Triglycerides: 103 mg/dL (ref 0–149)
VLDL Cholesterol Cal: 19 mg/dL (ref 5–40)

## 2022-02-01 LAB — CARDIOVASCULAR RISK ASSESSMENT

## 2022-02-01 LAB — HEMOGLOBIN A1C
Est. average glucose Bld gHb Est-mCnc: 157 mg/dL
Hgb A1c MFr Bld: 7.1 % — ABNORMAL HIGH (ref 4.8–5.6)

## 2022-02-02 NOTE — Progress Notes (Signed)
Glucose 213, kidney tests ok, liver tests ok, LDL Cholesterol high 117, G8J 7.1 improved A1c, CBC normal lp

## 2022-02-03 DIAGNOSIS — R351 Nocturia: Secondary | ICD-10-CM | POA: Diagnosis not present

## 2022-02-03 DIAGNOSIS — R339 Retention of urine, unspecified: Secondary | ICD-10-CM | POA: Diagnosis not present

## 2022-02-03 DIAGNOSIS — N401 Enlarged prostate with lower urinary tract symptoms: Secondary | ICD-10-CM | POA: Diagnosis not present

## 2022-02-03 DIAGNOSIS — Z125 Encounter for screening for malignant neoplasm of prostate: Secondary | ICD-10-CM | POA: Diagnosis not present

## 2022-02-05 ENCOUNTER — Ambulatory Visit (INDEPENDENT_AMBULATORY_CARE_PROVIDER_SITE_OTHER): Payer: Medicare Other | Admitting: Legal Medicine

## 2022-02-05 ENCOUNTER — Encounter: Payer: Self-pay | Admitting: Legal Medicine

## 2022-02-05 VITALS — BP 92/56 | HR 85 | Temp 98.1°F | Resp 15 | Ht 67.0 in | Wt 132.0 lb

## 2022-02-05 DIAGNOSIS — E669 Obesity, unspecified: Secondary | ICD-10-CM | POA: Diagnosis not present

## 2022-02-05 DIAGNOSIS — I5042 Chronic combined systolic (congestive) and diastolic (congestive) heart failure: Secondary | ICD-10-CM

## 2022-02-05 DIAGNOSIS — I152 Hypertension secondary to endocrine disorders: Secondary | ICD-10-CM

## 2022-02-05 DIAGNOSIS — T466X5A Adverse effect of antihyperlipidemic and antiarteriosclerotic drugs, initial encounter: Secondary | ICD-10-CM

## 2022-02-05 DIAGNOSIS — N401 Enlarged prostate with lower urinary tract symptoms: Secondary | ICD-10-CM

## 2022-02-05 DIAGNOSIS — I10 Essential (primary) hypertension: Secondary | ICD-10-CM | POA: Diagnosis not present

## 2022-02-05 DIAGNOSIS — G20 Parkinson's disease: Secondary | ICD-10-CM

## 2022-02-05 DIAGNOSIS — N138 Other obstructive and reflux uropathy: Secondary | ICD-10-CM | POA: Diagnosis not present

## 2022-02-05 DIAGNOSIS — E782 Mixed hyperlipidemia: Secondary | ICD-10-CM

## 2022-02-05 DIAGNOSIS — E1169 Type 2 diabetes mellitus with other specified complication: Secondary | ICD-10-CM

## 2022-02-05 DIAGNOSIS — M47816 Spondylosis without myelopathy or radiculopathy, lumbar region: Secondary | ICD-10-CM

## 2022-02-05 DIAGNOSIS — I2510 Atherosclerotic heart disease of native coronary artery without angina pectoris: Secondary | ICD-10-CM | POA: Diagnosis not present

## 2022-02-05 DIAGNOSIS — M791 Myalgia, unspecified site: Secondary | ICD-10-CM

## 2022-02-05 DIAGNOSIS — E1159 Type 2 diabetes mellitus with other circulatory complications: Secondary | ICD-10-CM

## 2022-02-05 DIAGNOSIS — G20B2 Parkinson's disease with dyskinesia, with fluctuations: Secondary | ICD-10-CM

## 2022-02-05 MED ORDER — EZETIMIBE 10 MG PO TABS: 10.0000 mg | ORAL_TABLET | Freq: Every day | ORAL | 3 refills | Status: DC

## 2022-02-05 NOTE — Progress Notes (Addendum)
Subjective:  Patient ID: Jeffrey Parsons, male    DOB: 02/14/1951  Age: 71 y.o. MRN: [id number]  Chief Complaint  Patient presents with   Diabetes   Hypertension   Hyperlipidemia    HPI [place] right 6th rib, posteolateral 5, 7, 8.  Healing well. No pain or sob per patient.  Diabetes: Patient  is taking Lantus 11 units daily, Jardiance 10 mg daily, Metformin 500 mg  twice a day. He check his blood sugar every day. Last A1C was 7.1%. He had his eye exam done.  He is on diet and exercising.Patient is using Freestyle precision.  No hypoglycemia.  Hyperlipidemia: Taking Rosuvastatin 10 mg daily.stable start zetia.He is on low fat diet.  CHF: Takes Entresto 49-51 mg twice a day, Spironolactone 25 mg 1/2 tablet daily. No DOE, no edema. IS treated by cardiology  Parkisonism: Carbidopa-Levodopa 50- 200 mg daily and 25-250 mg 4 times a day. 2 falls, weak at end of day.Neurologist added amantadine. With little help. Neurologist Discussed brain surgery using ultrasound, one of the new parkinsons techniques.  Patient had is having poor control of his Parkinson needs him and having worsening on and off symptoms. Current Outpatient Medications on File Prior to Visit  Medication Sig Dispense Refill   Amantadine HCl 100 MG tablet Take 100 mg by mouth 2 (two) times daily.     aspirin EC 81 MG tablet Take 1 tablet (81 mg total) by mouth daily.     aspirin EC 81 MG tablet Take by mouth.     carbidopa-levodopa (SINEMET CR) 50-200 MG tablet Take 1 tablet by mouth every evening.      carbidopa-levodopa (SINEMET IR) 25-250 MG tablet Take 1 tablet by mouth 4 (four) times daily.      carvedilol (COREG) 12.5 MG tablet Take 1 tablet (12.5 mg total) by mouth 2 (two) times daily. 180 tablet 3   Cyanocobalamin 1000 MCG CAPS Take 1 tablet by mouth daily.     empagliflozin (JARDIANCE) 10 MG TABS tablet Take 1 tablet (10 mg total) by mouth daily before breakfast. 90 tablet 0    insulin glargine (LANTUS) 100 UNIT/ML injection Inject 11 Units into the skin daily.     metFORMIN (GLUCOPHAGE) 500 MG tablet TAKE 2 TABLETS (1,000 MG TOTAL) 2 (TWO) TIMES DAILY WITH A MEAL. START THIS WITH NEW INSULIN 360 tablet 2   Multiple Vitamin (MULTIVITAMIN) tablet Take 1 tablet by mouth daily.     oxybutynin (DITROPAN) 5 MG tablet Take 5 mg by mouth daily.     rosuvastatin (CRESTOR) 10 MG tablet Take 1 tablet by mouth daily before breakfast.     sacubitril-valsartan (ENTRESTO) 49-51 MG Take 1 tablet by mouth 2 (two) times daily. 180 tablet 3   spironolactone (ALDACTONE) 25 MG tablet Take 0.5 tablets by mouth daily.     tamsulosin (FLOMAX) 0.4 MG CAPS capsule SMARTSIG:1 Capsule(s) By Mouth Every Evening     No current facility-administered medications on file prior to visit.   Past Medical History:  Diagnosis Date   AICD (automatic cardioverter/defibrillator) present    Allergy to sulfa drugs 12/04/2016   Complete heart block (HCC) 05/26/2017   Coronary artery disease involving native coronary artery of native heart without angina pectoris 09/12/2016   Ischemic cardiomyopathy 05/26/2017   Pneumonia 2019   Retinopathy    Past Surgical History:  Procedure Laterality Date   BI-VENTRICULAR IMPLANTABLE CARDIOVERTER DEFIBRILLATOR UPGRADE  06/25/2018   BIV UPGRADE N/A 06/25/2018  Procedure: BIV ICD UPGRADE;  Surgeon: Duke Salvia, MD;  Location: [facility] INVASIVE CV LAB;  Service: Cardiovascular;  Laterality: N/A;   CARDIAC CATHETERIZATION  2001; ~ 2013   CATARACT EXTRACTION W/ INTRAOCULAR LENS  IMPLANT, BILATERAL Bilateral    CORONARY ARTERY BYPASS GRAFT  2001   "CABG X5"   INSERT / REPLACE / REMOVE PACEMAKER  05/2010   KNEE SURGERY Left    AS A CHILD   R shoulder surgery  05/2020   RIGHT/LEFT HEART CATH AND CORONARY/GRAFT ANGIOGRAPHY N/A 06/16/2017   Procedure: RIGHT/LEFT HEART CATH AND CORONARY/GRAFT ANGIOGRAPHY;  Surgeon: Tonny Bollman, MD;  Location: [facility] INVASIVE CV LAB;  Service:  Cardiovascular;  Laterality: N/A;   TONSILLECTOMY      Family History  Problem Relation Age of Onset   Bronchitis Mother    Coronary artery disease Father    Heart disease Father    Hypertension Sister    Hypertension Brother    Social History   Socioeconomic History   Marital status: Married    Spouse name: Not on file   Number of children: Not on file   Years of education: Not on file   Highest education level: Not on file  Occupational History   Not on file  Tobacco Use   Smoking status: Never   Smokeless tobacco: Never  Vaping Use   Vaping Use: Never used  Substance and Sexual Activity   Alcohol use: Never   Drug use: Never   Sexual activity: Yes  Other Topics Concern   Not on file  Social History Narrative   Not on file   Social Determinants of Health   Financial Resource Strain: High Risk (01/16/2022)   Overall Financial Resource Strain (CARDIA)    Difficulty of Paying Living Expenses: Hard  Food Insecurity: No Food Insecurity (01/26/2020)   Hunger Vital Sign    Worried About Running Out of Food in the Last Year: Never true    Ran Out of Food in the Last Year: Never true  Transportation Needs: No Transportation Needs (01/26/2020)   PRAPARE - Administrator, Civil Service (Medical): No    Lack of Transportation (Non-Medical): No  Physical Activity: Sufficiently Active (01/16/2022)   Exercise Vital Sign    Days of Exercise per Week: 5 days    Minutes of Exercise per Session: 50 min  Stress: Not on file  Social Connections: Socially Integrated (05/08/2020)   Social Connection and Isolation Panel [NHANES]    Frequency of Communication with Friends and Family: More than three times a week    Frequency of Social Gatherings with Friends and Family: Twice a week    Attends Religious Services: More than 4 times per year    Active Member of [employer] or Organizations: Yes    Attends Engineer, structural: More than 4 times per year    Marital Status:  Married    Review of Systems  Constitutional:  Negative for chills, fatigue, fever and unexpected weight change.  HENT:  Negative for congestion, ear pain, sinus pain and sore throat.   Eyes:  Negative for visual disturbance.  Respiratory:  Negative for cough and shortness of breath.   Cardiovascular:  Negative for chest pain and palpitations.  Gastrointestinal:  Negative for abdominal pain, blood in stool, constipation, diarrhea, nausea and vomiting.  Endocrine: Negative for polydipsia.  Genitourinary:  Negative for dysuria.  Musculoskeletal:  Negative for back pain.  Skin:  Negative for rash.  Neurological:  Negative for  headaches.  Psychiatric/Behavioral: Negative.       Objective:  BP (!) 92/56   Pulse 85   Temp 98.1 F (36.7 C)   Resp 15   Ht 5\' 7"  (1.702 m)   Wt 132 lb (59.9 kg)   SpO2 98%   BMI 20.67 kg/m      02/05/2022    2:24 PM 02/05/2022    2:06 PM 10/29/2021    8:25 AM  BP/Weight  Systolic BP 92 70 98  Diastolic BP 56 50 70  Wt. (Lbs)  132 144  BMI  20.67 kg/m2 22.55 kg/m2    Physical Exam Vitals reviewed.  Constitutional:      General: He is not in acute distress.    Appearance: Normal appearance.  HENT:     Head: Normocephalic and atraumatic.     Right Ear: Tympanic membrane normal.     Left Ear: Tympanic membrane normal.     Nose: Nose normal.     Mouth/Throat:     Mouth: Mucous membranes are moist.     Pharynx: Oropharynx is clear.  Eyes:     Extraocular Movements: Extraocular movements intact.     Conjunctiva/sclera: Conjunctivae normal.     Pupils: Pupils are equal, round, and reactive to light.  Cardiovascular:     Rate and Rhythm: Normal rate and regular rhythm.     Pulses: Normal pulses.     Heart sounds: Normal heart sounds. No murmur heard.    No gallop.  Pulmonary:     Effort: Pulmonary effort is normal.     Breath sounds: Normal breath sounds.  Abdominal:     General: Abdomen is flat. Bowel sounds are normal. There is no  distension.     Tenderness: There is no abdominal tenderness.  Musculoskeletal:     Cervical back: Normal range of motion and neck supple.  Skin:    General: Skin is warm.     Capillary Refill: Capillary refill takes less than 2 seconds.  Neurological:     General: No focal deficit present.     Mental Status: He is alert and oriented to person, place, and time. Mental status is at baseline.     Motor: Weakness present.     Gait: Gait abnormal.         Lab Results  Component Value Date   WBC 6.5 01/31/2022   HGB 14.1 01/31/2022   HCT 40.9 01/31/2022   PLT 187 01/31/2022   GLUCOSE 213 (H) 01/31/2022   CHOL 181 01/31/2022   TRIG 103 01/31/2022   HDL 45 01/31/2022   LDLCALC 117 (H) 01/31/2022   ALT 4 01/31/2022   AST 13 01/31/2022   NA 134 01/31/2022   K 4.6 01/31/2022   CL 96 01/31/2022   CREATININE 1.02 01/31/2022   BUN 28 (H) 01/31/2022   CO2 22 01/31/2022   TSH 2.320 11/23/2020   HGBA1C 7.1 (H) 01/31/2022   MICROALBUR 80 01/17/2020      Assessment & Plan:   Problem List Items Addressed This Visit       Cardiovascular and Mediastinum   Coronary artery disease involving native coronary artery of native heart without angina pectoris - Primary   Relevant Medications   aspirin EC 81 MG tablet   rosuvastatin (CRESTOR) 10 MG tablet   ezetimibe (ZETIA) 10 MG tablet An individual plan was formulated based on patient history and exam, labs and evidence based data. Patient has not had recent angina or nitroglycerin use. continue present  treatment.     Chronic combined systolic and diastolic CHF (congestive heart failure) (HCC)   Relevant Medications   aspirin EC 81 MG tablet   rosuvastatin (CRESTOR) 10 MG tablet   ezetimibe (ZETIA) 10 MG tablet An individualized care plan was established and reinforced.  The patient's disease status was assessed using clinical finding son exam today, labs, and/or other diagnostic testing such as x-rays, to determine the patient's  success in meeting treatmentgoalsbased on disease-based guidelines and found to beimproving. But not at goal yet. Medications prescriptions no changes Laboratory tests ordered to be performed today include routine. RECOMMENDATIONS: given include follow up with cardiology.  Call physician is patient gains 3 lbs in one day or 5 lbs for one week.  Call for progressive PND, orthopnea or increased pedal edema.     Essential hypertension   Relevant Medications   aspirin EC 81 MG tablet   rosuvastatin (CRESTOR) 10 MG tablet   ezetimibe (ZETIA) 10 MG tablet An individual hypertension care plan was established and reinforced today.  The patient's status was assessed using clinical findings on exam and labs or diagnostic tests. The patient's success at meeting treatment goals on disease specific evidence-based guidelines and found to be well controlled. SELF MANAGEMENT: The patient and I together assessed ways to personally work towards obtaining the recommended goals. RECOMMENDATIONS: avoid decongestants found in common cold remedies, decrease consumption of alcohol, perform routine monitoring of BP with home BP cuff, exercise, reduction of dietary salt, take medicines as prescribed, try not to miss doses and quit smoking.  Regular exercise and maintaining a healthy weight is needed.  Stress reduction may help. A CLINICAL SUMMARY including written plan identify barriers to care unique to individual due to social or financial issues.  We attempt to mutually creat solutions for individual and family understanding.     Obesity, diabetes, and hypertension syndrome (HCC)   Relevant Medications   aspirin EC 81 MG tablet   rosuvastatin (CRESTOR) 10 MG tablet   insulin glargine (LANTUS) 100 UNIT/ML injection   ezetimibe (ZETIA) 10 MG tablet An individual care plan for diabetes was established and reinforced today.  The patient's status was assessed using clinical findings on exam, labs and diagnostic testing.  Patient success at meeting goals based on disease specific evidence-based guidelines and found to be good controlled. He is on diet, very limited ability for exercise. Medications were assessed and patient's understanding of the medical issues , including barriers were assessed. Recommend adherence to a diabetic diet, a graduated exercise program, HgbA1c level is checked quarterly, and urine microalbumin performed yearly .  Annual mono-filament sensation testing performed. Lower blood pressure and control hyperlipidemia is important. Get annual eye exams and annual flu shots and smoking cessation discussed.  Self management goals were discussed. Patient is using freestyle Precision Neo strips for CGM.  He is following his glucose levels and they remain well controled.  Continue CGM     Nervous and Auditory   Parkinson's disease [facility]) Patient has chronic Parkinson's disease and has been on Sinemet for quite some time is beginning to wear off he saw his neurologist to try him on amantadine which has not been helping much.  He is having much more dyskinesia and on-off phenomenon.  He is to contact his neurologist about using other type of medication and to see if he may be eligible for one of the surgical procedures for Parkinson's.     Genitourinary   BPH with obstruction/lower urinary tract symptoms AN  INDIVIDUAL CARE PLAN for BPH was established and reinforced today.  The patient's status was assessed using clinical findings on exam, labs, and other diagnostic testing. Patient's success at meeting treatment goals based on disease specific evidence-bassed guidelines and found to be in good control. RECOMMENDATIONS include maintaining present medicines and treatment.      Other   Mixed hyperlipidemia   Relevant Medications   aspirin EC 81 MG tablet   rosuvastatin (CRESTOR) 10 MG tablet   ezetimibe (ZETIA) 10 MG tablet AN INDIVIDUAL CARE PLAN for hyperlipidemia/ cholesterol was established and  reinforced today.  The patient's status was assessed using clinical findings on exam, lab and other diagnostic tests. The patient's disease status was assessed based on evidence-based guidelines and found to be fair controlled. MEDICATIONS were reviewed. SELF MANAGEMENT GOALS have been discussed and patient's success at attaining the goal of low cholesterol was assessed. RECOMMENDATION given include regular exercise 3 days a week and low cholesterol/low fat diet. CLINICAL SUMMARY including written plan to identify barriers unique to the patient due to social or economic  reasons was discussed.       Other Visit Diagnoses     Lumbar spondylosis       Relevant Medications   aspirin EC 81 MG tablet   Other Relevant Orders   DG Lumbar Spine Complete Long term problems worsening with his parkinsons.     . A total of 40 minutes were spent face-to-face with the patient during this encounter and over half of that time was spent on counseling and coordination of care.        Follow-up: Return in about 3 months (around 05/08/2022).  An After Visit Summary was printed and given to the patient.  [name], MD [facility] [phone]

## 2022-02-05 NOTE — Telephone Encounter (Signed)
Patient being evaluated by PCP today. Will defer to them

## 2022-02-10 ENCOUNTER — Other Ambulatory Visit: Payer: Self-pay | Admitting: Legal Medicine

## 2022-02-11 DIAGNOSIS — M545 Low back pain, unspecified: Secondary | ICD-10-CM | POA: Diagnosis not present

## 2022-02-11 DIAGNOSIS — M47816 Spondylosis without myelopathy or radiculopathy, lumbar region: Secondary | ICD-10-CM | POA: Diagnosis not present

## 2022-02-17 ENCOUNTER — Encounter: Payer: Self-pay | Admitting: Legal Medicine

## 2022-02-17 DIAGNOSIS — E113293 Type 2 diabetes mellitus with mild nonproliferative diabetic retinopathy without macular edema, bilateral: Secondary | ICD-10-CM | POA: Diagnosis not present

## 2022-02-17 LAB — HM DIABETES EYE EXAM

## 2022-02-18 ENCOUNTER — Ambulatory Visit (INDEPENDENT_AMBULATORY_CARE_PROVIDER_SITE_OTHER): Payer: Medicare Other | Admitting: Nurse Practitioner

## 2022-02-18 ENCOUNTER — Encounter: Payer: Self-pay | Admitting: Nurse Practitioner

## 2022-02-18 VITALS — BP 92/50 | HR 83 | Temp 97.0°F | Resp 16 | Ht 67.0 in | Wt 130.4 lb

## 2022-02-18 DIAGNOSIS — N3000 Acute cystitis without hematuria: Secondary | ICD-10-CM | POA: Diagnosis not present

## 2022-02-18 DIAGNOSIS — R300 Dysuria: Secondary | ICD-10-CM

## 2022-02-18 DIAGNOSIS — B3749 Other urogenital candidiasis: Secondary | ICD-10-CM | POA: Diagnosis not present

## 2022-02-18 LAB — POCT URINALYSIS DIP (CLINITEK)
Bilirubin, UA: NEGATIVE
Blood, UA: NEGATIVE
Glucose, UA: >=1000 mg/dL — AB
Leukocytes, UA: NEGATIVE
Nitrite, UA: NEGATIVE
POC PROTEIN,UA: NEGATIVE
Spec Grav, UA: 1.02 (ref 1.010–1.025)
Urobilinogen, UA: 0.2 E.U./dL
pH, UA: 5 (ref 5.0–8.0)

## 2022-02-18 MED ORDER — AMOXICILLIN-POT CLAVULANATE 875-125 MG PO TABS: 1.0000 | ORAL_TABLET | Freq: Two times a day (BID) | ORAL | 0 refills | Status: DC

## 2022-02-18 MED ORDER — FLUCONAZOLE 150 MG PO TABS: 150.0000 mg | ORAL_TABLET | Freq: Once | ORAL | 0 refills | Status: AC

## 2022-02-18 NOTE — Patient Instructions (Addendum)
Augmentin twice daily for 5 days (with food) Take Diflucan 150 mg after completing antibiotics Push fluids, especially water Follow-up as needed  Urinary Tract Infection, Adult A urinary tract infection (UTI) is an infection of any part of the urinary tract. The urinary tract includes: The kidneys. The ureters. The bladder. The urethra. These organs make, store, and get rid of pee (urine) in the body. What are the causes? This infection is caused by germs (bacteria) in your genital area. These germs grow and cause swelling (inflammation) of your urinary tract. What increases the risk? The following factors may make you more likely to develop this condition: Using a small, thin tube (catheter) to drain pee. Not being able to control when you pee or poop (incontinence). Being male. If you are male, these things can increase the risk: Using these methods to prevent pregnancy: A medicine that kills sperm (spermicide). A device that blocks sperm (diaphragm). Having low levels of a male hormone (estrogen). Being pregnant. You are more likely to develop this condition if: You have genes that add to your risk. You are sexually active. You take antibiotic medicines. You have trouble peeing because of: A prostate that is bigger than normal, if you are male. A blockage in the part of your body that drains pee from the bladder. A kidney stone. A nerve condition that affects your bladder. Not getting enough to drink. Not peeing often enough. You have other conditions, such as: Diabetes. A weak disease-fighting system (immune system). Sickle cell disease. Gout. Injury of the spine. What are the signs or symptoms? Symptoms of this condition include: Needing to pee right away. Peeing small amounts often. Pain or burning when peeing. Blood in the pee. Pee that smells bad or not like normal. Trouble peeing. Pee that is cloudy. Fluid coming from the vagina, if you are  male. Pain in the belly or lower back. Other symptoms include: Vomiting. Not feeling hungry. Feeling mixed up (confused). This may be the first symptom in older adults. Being tired and grouchy (irritable). A fever. Watery poop (diarrhea). How is this treated? Taking antibiotic medicine. Taking other medicines. Drinking enough water. In some cases, you may need to see a specialist. Follow these instructions at home:  Medicines Take over-the-counter and prescription medicines only as told by your doctor. If you were prescribed an antibiotic medicine, take it as told by your doctor. Do not stop taking it even if you start to feel better. General instructions Make sure you: Pee until your bladder is empty. Do not hold pee for a long time. Empty your bladder after sex. Wipe from front to back after peeing or pooping if you are a male. Use each tissue one time when you wipe. Drink enough fluid to keep your pee pale yellow. Keep all follow-up visits. Contact a doctor if: You do not get better after 1-2 days. Your symptoms go away and then come back. Get help right away if: You have very bad back pain. You have very bad pain in your lower belly. You have a fever. You have chills. You feeling like you will vomit or you vomit. Summary A urinary tract infection (UTI) is an infection of any part of the urinary tract. This condition is caused by germs in your genital area. There are many risk factors for a UTI. Treatment includes antibiotic medicines. Drink enough fluid to keep your pee pale yellow. This information is not intended to replace advice given to you by your health care provider.  Make sure you discuss any questions you have with your health care provider. Document Revised: 01/27/2020 Document Reviewed: 01/27/2020 Elsevier Patient Education  [place].

## 2022-02-18 NOTE — Progress Notes (Signed)
Acute Office Visit  Subjective:    Patient ID: Jeffrey Parsons, male    DOB: 24-Jul-1950, 71 y.o.   MRN: [id number]  Chief Complaint  Patient presents with   Urinary Tract Infection    HPI: Patient is in today for dysuria, intermittent confusion, generalized weakness. Pt denies fever, chills, abd or flank pain. Denies history of kidney stones.Pt has Parkinson's disease, he is seated in a w/c; accompanied by his spouse who helps supplement history. Spouse reports pt's blood glucose was elevated yesterday in the 300's. Pt is currently prescribed Jardiance, Metformin,  and insulin for blood glucose management.   Urinary symptoms  He reports new onset dysuria. The current episode started yesterday and is rapidly worsening. Patient states symptoms are 7/10 in intensity, occurring intermittently. He  has not been recently treated for similar symptoms.       Past Medical History:  Diagnosis Date   AICD (automatic cardioverter/defibrillator) present    Allergy to sulfa drugs 12/04/2016   Complete heart block ([facility]) 05/26/2017   Coronary artery disease involving native coronary artery of native heart without angina pectoris 09/12/2016   Ischemic cardiomyopathy 05/26/2017   Pneumonia 2019   Retinopathy     Past Surgical History:  Procedure Laterality Date   BI-VENTRICULAR IMPLANTABLE CARDIOVERTER DEFIBRILLATOR UPGRADE  06/25/2018   BIV UPGRADE N/A 06/25/2018   Procedure: BIV ICD UPGRADE;  Surgeon: Deboraha Sprang, MD;  Location: [facility];  Service: Cardiovascular;  Laterality: N/A;   CARDIAC CATHETERIZATION  2001; ~ 2013   CATARACT EXTRACTION W/ INTRAOCULAR LENS  IMPLANT, BILATERAL Bilateral    CORONARY ARTERY BYPASS GRAFT  2001   "CABG X5"   INSERT / REPLACE / REMOVE PACEMAKER  05/2010   KNEE SURGERY Left    AS A CHILD   R shoulder surgery  05/2020   RIGHT/LEFT HEART CATH AND CORONARY/GRAFT ANGIOGRAPHY N/A 06/16/2017   Procedure: RIGHT/LEFT HEART CATH AND CORONARY/GRAFT  ANGIOGRAPHY;  Surgeon: Sherren Mocha, MD;  Location: [facility];  Service: Cardiovascular;  Laterality: N/A;   TONSILLECTOMY      Family History  Problem Relation Age of Onset   Bronchitis Mother    Coronary artery disease Father    Heart disease Father    Hypertension Sister    Hypertension Brother     Social History   Socioeconomic History   Marital status: Married    Spouse name: Not on file   Number of children: Not on file   Years of education: Not on file   Highest education level: Not on file  Occupational History   Not on file  Tobacco Use   Smoking status: Never   Smokeless tobacco: Never  Vaping Use   Vaping Use: Never used  Substance and Sexual Activity   Alcohol use: Never   Drug use: Never   Sexual activity: Yes  Other Topics Concern   Not on file  Social History Narrative   Not on file   Social Determinants of Health   Financial Resource Strain: High Risk (01/16/2022)   Overall Financial Resource Strain (CARDIA)    Difficulty of Paying Living Expenses: Hard  Food Insecurity: No Food Insecurity (01/26/2020)   Hunger Vital Sign    Worried About Running Out of Food in the Last Year: Never true    [place] in the Last Year: Never true  Transportation Needs: No Transportation Needs (01/26/2020)   PRAPARE - Hydrologist (Medical): No  Lack of Transportation (Non-Medical): No  Physical Activity: Sufficiently Active (01/16/2022)   Exercise Vital Sign    Days of Exercise per Week: 5 days    Minutes of Exercise per Session: 50 min  Stress: Not on file  Social Connections: Socially Integrated (05/08/2020)   Social Connection and Isolation Panel [NHANES]    Frequency of Communication with Friends and Family: More than three times a week    Frequency of Social Gatherings with Friends and Family: Twice a week    Attends Religious Services: More than 4 times per year    Active Member of Clubs or Organizations: Yes     Attends Club or Organization Meetings: More than 4 times per year    Marital Status: Married  Intimate Partner Violence: Not on file    Outpatient Medications Prior to Visit  Medication Sig Dispense Refill   Amantadine HCl 100 MG tablet Take 100 mg by mouth 2 (two) times daily.     aspirin EC 81 MG tablet Take 1 tablet (81 mg total) by mouth daily.     aspirin EC 81 MG tablet Take by mouth.     carbidopa-levodopa (SINEMET CR) 50-200 MG tablet Take 1 tablet by mouth every evening.      carbidopa-levodopa (SINEMET IR) 25-250 MG tablet Take 1 tablet by mouth 4 (four) times daily.      carvedilol (COREG) 12.5 MG tablet Take 1 tablet (12.5 mg total) by mouth 2 (two) times daily. 180 tablet 3   Cyanocobalamin 1000 MCG CAPS Take 1 tablet by mouth daily.     ezetimibe (ZETIA) 10 MG tablet Take 1 tablet (10 mg total) by mouth daily. 90 tablet 3   insulin glargine (LANTUS) 100 UNIT/ML injection Inject 11 Units into the skin daily.     JARDIANCE 10 MG TABS tablet TAKE 1 TABLET BY MOUTH DAILY BEFORE BREAKFAST. 90 tablet 0   metFORMIN (GLUCOPHAGE) 500 MG tablet TAKE 2 TABLETS (1,000 MG TOTAL) 2 (TWO) TIMES DAILY WITH A MEAL. START THIS WITH NEW INSULIN 360 tablet 2   Multiple Vitamin (MULTIVITAMIN) tablet Take 1 tablet by mouth daily.     oxybutynin (DITROPAN) 5 MG tablet Take 5 mg by mouth daily.     rosuvastatin (CRESTOR) 10 MG tablet Take 1 tablet by mouth daily before breakfast.     sacubitril-valsartan (ENTRESTO) 49-51 MG Take 1 tablet by mouth 2 (two) times daily. 180 tablet 3   spironolactone (ALDACTONE) 25 MG tablet Take 0.5 tablets by mouth daily.     tamsulosin (FLOMAX) 0.4 MG CAPS capsule SMARTSIG:1 Capsule(s) By Mouth Every Evening     No facility-administered medications prior to visit.    Allergies  Allergen Reactions   Farxiga [Dapagliflozin] Other (See Comments)    Patient lost weight.   Onion     Upset stomach   Sulfa Antibiotics Other (See Comments)    FLUSH     Review of  Systems See pertinent positives and negatives per HPI.     Objective:    Physical Exam Vitals reviewed.  Constitutional:      Appearance: He is ill-appearing.     Comments: Frail in appearance  HENT:     Right Ear: Tympanic membrane normal.     Left Ear: Tympanic membrane normal.     Mouth/Throat:     Mouth: Mucous membranes are moist.  Cardiovascular:     Rate and Rhythm: Normal rate and regular rhythm.     Pulses: Normal pulses.       Heart sounds: Normal heart sounds.  Abdominal:     General: Bowel sounds are normal.     Palpations: Abdomen is soft.     Tenderness: There is no abdominal tenderness. There is no right CVA tenderness or left CVA tenderness.  Neurological:     Mental Status: He is alert.     Motor: Weakness present.     Comments: Right arm resting tremor     BP (!) 92/50   Pulse 83   Temp (!) 97 F (36.1 C)   Resp 16   Ht 5' 7" (1.702 m)   Wt 130 lb 6.4 oz (59.1 kg)   SpO2 99%   BMI 20.42 kg/m  Wt Readings from Last 3 Encounters:  02/18/22 130 lb 6.4 oz (59.1 kg)  02/05/22 132 lb (59.9 kg)  10/29/21 144 lb (65.3 kg)    Health Maintenance Due  Topic Date Due   TETANUS/TDAP  Never done   Zoster Vaccines- Shingrix (1 of 2) Never done   Pneumonia Vaccine 65+ Years old (1 - PCV) Never done   COVID-19 Vaccine (3 - Moderna risk series) 09/28/2019   INFLUENZA VACCINE  01/28/2022       Lab Results  Component Value Date   TSH 2.320 11/23/2020   Lab Results  Component Value Date   WBC 6.5 01/31/2022   HGB 14.1 01/31/2022   HCT 40.9 01/31/2022   MCV 93 01/31/2022   PLT 187 01/31/2022   Lab Results  Component Value Date   NA 134 01/31/2022   K 4.6 01/31/2022   CO2 22 01/31/2022   GLUCOSE 213 (H) 01/31/2022   BUN 28 (H) 01/31/2022   CREATININE 1.02 01/31/2022   BILITOT 1.1 01/31/2022   ALKPHOS 103 01/31/2022   AST 13 01/31/2022   ALT 4 01/31/2022   PROT 6.7 01/31/2022   ALBUMIN 4.5 01/31/2022   CALCIUM 9.0 01/31/2022   ANIONGAP 12  03/29/2020   EGFR 79 01/31/2022   Lab Results  Component Value Date   CHOL 181 01/31/2022   Lab Results  Component Value Date   HDL 45 01/31/2022   Lab Results  Component Value Date   LDLCALC 117 (H) 01/31/2022   Lab Results  Component Value Date   TRIG 103 01/31/2022   Lab Results  Component Value Date   CHOLHDL 4.0 01/31/2022   Lab Results  Component Value Date   HGBA1C 7.1 (H) 01/31/2022       Assessment & Plan:   1. Acute cystitis without hematuria - amoxicillin-clavulanate (AUGMENTIN) 875-125 MG tablet; Take 1 tablet by mouth 2 (two) times daily.  Dispense: 10 tablet; Refill: 0  2. Genital candidiasis - fluconazole (DIFLUCAN) 150 MG tablet; Take 1 tablet (150 mg total) by mouth once for 1 dose.  Dispense: 1 tablet; Refill: 0  3. Dysuria - POCT URINALYSIS DIP (CLINITEK)   Augmentin twice daily for 5 days (with food) Take Diflucan 150 mg after completing antibiotics Push fluids, especially water Follow-up as needed     Follow-up: PRN  An After Visit Summary was printed and given to the patient.  I, Shannon J Heaton, NP, have reviewed all documentation for this visit. The documentation on 02/18/22 for the exam, diagnosis, procedures, and orders are all accurate and complete.   Signed, Shannon J Heaton, NP [facility] [phone] 

## 2022-02-19 ENCOUNTER — Other Ambulatory Visit: Payer: Self-pay

## 2022-02-19 DIAGNOSIS — M47816 Spondylosis without myelopathy or radiculopathy, lumbar region: Secondary | ICD-10-CM

## 2022-02-20 ENCOUNTER — Other Ambulatory Visit: Payer: Self-pay | Admitting: Nurse Practitioner

## 2022-02-20 ENCOUNTER — Telehealth: Payer: Self-pay

## 2022-02-20 DIAGNOSIS — N3000 Acute cystitis without hematuria: Secondary | ICD-10-CM

## 2022-02-20 MED ORDER — DOXYCYCLINE HYCLATE 100 MG PO TABS: 100.0000 mg | ORAL_TABLET | Freq: Two times a day (BID) | ORAL | 0 refills | Status: AC

## 2022-02-20 NOTE — Telephone Encounter (Signed)
Patient's wife debbie called and stated that her husband is having an allergic reaction to the amoxicillin he was given. She states that after two doses of the medication and patient was eating with the medication too as well, he started throwing up and staying really nauseated. Wife states that this had happened before the last time he had taken amoxicillin which has been a few years ago and stated that he had the same reaction but they thought the patient had a stomach bug of some kind. Wife also stated to let you know that the patient's sugars have leveled off too as well. I have added this to patient's allergy list as well. Please advise.

## 2022-02-25 DIAGNOSIS — G250 Essential tremor: Secondary | ICD-10-CM | POA: Diagnosis not present

## 2022-02-25 DIAGNOSIS — G20 Parkinson's disease: Secondary | ICD-10-CM | POA: Diagnosis not present

## 2022-02-25 DIAGNOSIS — R350 Frequency of micturition: Secondary | ICD-10-CM | POA: Diagnosis not present

## 2022-03-04 DIAGNOSIS — M5136 Other intervertebral disc degeneration, lumbar region: Secondary | ICD-10-CM | POA: Diagnosis not present

## 2022-03-04 DIAGNOSIS — M545 Low back pain, unspecified: Secondary | ICD-10-CM | POA: Diagnosis not present

## 2022-03-04 NOTE — Progress Notes (Unsigned)
Cardiology Office Note:    Date:  03/05/2022   ID:  [name], DOB 15-Nov-1950, MRN [id number]  PCP:  Abigail Miyamoto, MD  Cardiologist:  Norman Herrlich, MD    Referring MD: Abigail Miyamoto,*    ASSESSMENT:    1. Other specified hypotension   2. Ischemic cardiomyopathy   3. Coronary artery disease involving native coronary artery of native heart without angina pectoris   4. Biventricular implantable cardioverter-defibrillator in situ   5. Mixed hyperlipidemia    PLAN:    In order of problems listed above:  His primary problem is symptomatic orthostatic hypotension in the context of cardiomyopathy alpha-blocker therapy for BPH and underlying Parkinson's disease.  We will transition carvedilol to to Toprol to avoid orthostatic hypotension decrease the dose of Entresto and trend blood pressures at home.  Hopefully he can stay on his guideline directed medical therapy he is not on MRA or SGLT2 inhibitor. Stable CAD having no anginal discomfort continue medical therapy including aspirin and statin Stable will device followed in our clinic Continue his statin with CAD   Next appointment: 6 months   Medication Adjustments/Labs and Tests Ordered: Current medicines are reviewed at length with the patient today.  Concerns regarding medicines are outlined above.  No orders of the defined types were placed in this encounter.  No orders of the defined types were placed in this encounter.   Chief Complaint  Patient presents with   Follow-up   Coronary Artery Disease   Cardiomyopathy    History of Present Illness:    Jeffrey Parsons is a 71 y.o. male with a hx of CAD with remote CABG greater than 20 years ago type 2 diabetes mellitus hyperlipidemia sick sinus syndrome with permanent pacemaker CRT/D and Parkinson's disease last seen 09/16/2021.  Echocardiogram December 2021 showed cardiomyopathy with EF severely reduced 30 to 35%.  He was not initiated on MRA or an  SGLT2 inhibitor of borderline blood pressure but was on beta-blocker and Entresto.  Compliance with diet, lifestyle and medications: Yes  He was recently seen by his Parkinson neurologist [facility] was noted to have symptomatic hypotension referred back to my office His wife is checking blood pressure with a wrist cuff she will get a new digital arm device  His last device check 01/08/2022 showed normal device and lead parameters.  He is ventricularly paced 98% of the time.  Getting blood pressures of less than 100 systolic. Also was placed on alpha-blocker tamsulosin With edema chest pain shortness of breath palpitation or syncope He does get lightheaded standing  Repeat blood pressure by me in the office 98/50 sitting 82/50 standing Past Medical History:  Diagnosis Date   AICD (automatic cardioverter/defibrillator) present    Allergy to sulfa drugs 12/04/2016   Complete heart block ([facility]) 05/26/2017   Coronary artery disease involving native coronary artery of native heart without angina pectoris 09/12/2016   Ischemic cardiomyopathy 05/26/2017   Pneumonia 2019   Retinopathy     Past Surgical History:  Procedure Laterality Date   BI-VENTRICULAR IMPLANTABLE CARDIOVERTER DEFIBRILLATOR UPGRADE  06/25/2018   BIV UPGRADE N/A 06/25/2018   Procedure: BIV ICD UPGRADE;  Surgeon: Duke Salvia, MD;  Location: [facility];  Service: Cardiovascular;  Laterality: N/A;   CARDIAC CATHETERIZATION  2001; ~ 2013   CATARACT EXTRACTION W/ INTRAOCULAR LENS  IMPLANT, BILATERAL Bilateral    CORONARY ARTERY BYPASS GRAFT  2001   "CABG X5"   INSERT / REPLACE / REMOVE PACEMAKER  05/2010   KNEE SURGERY Left    AS A CHILD   R shoulder surgery  05/2020   RIGHT/LEFT HEART CATH AND CORONARY/GRAFT ANGIOGRAPHY N/A 06/16/2017   Procedure: RIGHT/LEFT HEART CATH AND CORONARY/GRAFT ANGIOGRAPHY;  Surgeon: Tonny Bollman, MD;  Location: [facility] IN[facility]IVE [facility];  Service: Cardiovascular;  Laterality: N/A;    TONSILLECTOMY      Current Medications: Current Meds  Medication Sig   aspirin EC 81 MG tablet Take 1 tablet (81 mg total) by mouth daily.   carbidopa-levodopa (SINEMET CR) 50-200 MG tablet Take 1 tablet by mouth every evening.    carbidopa-levodopa (SINEMET IR) 25-250 MG tablet Take 1 tablet by mouth 4 (four) times daily.    carvedilol (COREG) 12.5 MG tablet Take 1 tablet (12.5 mg total) by mouth 2 (two) times daily.   Cyanocobalamin 1000 MCG CAPS Take 1 tablet by mouth daily.   ezetimibe (ZETIA) 10 MG tablet Take 1 tablet (10 mg total) by mouth daily.   insulin glargine (LANTUS) 100 UNIT/ML injection Inject 11 Units into the skin daily.   JARDIANCE 10 MG TABS tablet TAKE 1 TABLET BY MOUTH DAILY BEFORE BREAKFAST.   metFORMIN (GLUCOPHAGE) 500 MG tablet TAKE 2 TABLETS (1,000 MG TOTAL) 2 (TWO) TIMES DAILY WITH A MEAL. START THIS WITH NEW INSULIN   mirtazapine (REMERON) 15 MG tablet Take 7.5 mg by mouth at bedtime.   Multiple Vitamin (MULTIVITAMIN) tablet Take 1 tablet by mouth daily.   oxybutynin (DITROPAN) 5 MG tablet Take 5 mg by mouth daily.   rosuvastatin (CRESTOR) 10 MG tablet Take 1 tablet by mouth daily before breakfast.   sacubitril-valsartan (ENTRESTO) 49-51 MG Take 1 tablet by mouth 2 (two) times daily.   tamsulosin (FLOMAX) 0.4 MG CAPS capsule SMARTSIG:1 Capsule(s) By Mouth Every Evening     Allergies:   Farxiga [dapagliflozin], Amoxicillin, Onion, and Sulfa antibiotics   Social History   Socioeconomic History   Marital status: Married    Spouse name: Not on file   Number of children: Not on file   Years of education: Not on file   Highest education level: Not on file  Occupational History   Not on file  Tobacco Use   Smoking status: Never   Smokeless tobacco: Never  Vaping Use   Vaping Use: Never used  Substance and Sexual Activity   Alcohol use: Never   Drug use: Never   Sexual activity: Yes  Other Topics Concern   Not on file  Social History Narrative   Not  on file   Social Determinants of Health   Financial Resource Strain: High Risk (01/16/2022)   Overall Financial Resource Strain (CARDIA)    Difficulty of Paying Living Expenses: Hard  Food Insecurity: No Food Insecurity (01/26/2020)   Hunger Vital Sign    Worried About Running Out of Food in the Last Year: Never true    Ran Out of Food in the Last Year: Never true  Transportation Needs: No Transportation Needs (01/26/2020)   PRAPARE - Administrator, Civil Service (Medical): No    Lack of Transportation (Non-Medical): No  Physical Activity: Sufficiently Active (01/16/2022)   Exercise Vital Sign    Days of Exercise per Week: 5 days    Minutes of Exercise per Session: 50 min  Stress: Not on file  Social Connections: Socially Integrated (05/08/2020)   Social Connection and Isolation Panel [NHANES]    Frequency of Communication with Friends and Family: More than three times a week  Frequency of Social Gatherings with Friends and Family: Twice a week    Attends Religious Services: More than 4 times per year    Active Member of [employer] or Organizations: Yes    Attends Engineer, structural: More than 4 times per year    Marital Status: Married     Family History: The patient's family history includes Bronchitis in his mother; Coronary artery disease in his father; Heart disease in his father; Hypertension in his brother and sister. ROS:   Please see the history of present illness.    All other systems reviewed and are negative.  EKGs/Labs/Other Studies Reviewed:    The following studies were reviewed today:  EKG:  EKG ordered today and personally reviewed.  The ekg ordered today demonstrates sinus rhythm 100% ventricularly paced 1 PVC  Recent Labs: 05/01/2021: NT-Pro BNP 3,313 01/31/2022: ALT 4; BUN 28; Creatinine, Ser 1.02; Hemoglobin 14.1; Platelets 187; Potassium 4.6; Sodium 134  Recent Lipid Panel    Component Value Date/Time   CHOL 181 01/31/2022 0833   TRIG  103 01/31/2022 0833   HDL 45 01/31/2022 0833   CHOLHDL 4.0 01/31/2022 0833   LDLCALC 117 (H) 01/31/2022 0833    Physical Exam:    VS:  BP (!) 96/48 (BP Location: Right Arm, Patient Position: Sitting)   Pulse 87   Ht 5\' 7"  (1.702 m)   Wt 135 lb 3.2 oz (61.3 kg)   SpO2 98%   BMI 21.18 kg/m     Wt Readings from Last 3 Encounters:  03/05/22 135 lb 3.2 oz (61.3 kg)  02/18/22 130 lb 6.4 oz (59.1 kg)  02/05/22 132 lb (59.9 kg)     GEN:  Well nourished, well developed in no acute distress HEENT: Normal NECK: No JVD; No carotid bruits LYMPHATICS: No lymphadenopathy CARDIAC: RRR, no murmurs, rubs, gallops RESPIRATORY:  Clear to auscultation without rales, wheezing or rhonchi  ABDOMEN: Soft, non-tender, non-distended MUSCULOSKELETAL:  No edema; No deformity  SKIN: Warm and dry NEUROLOGIC:  Alert and oriented x 3 PSYCHIATRIC:  Normal affect    Signed, 04/07/22, MD  03/05/2022 3:48 PM    [facility]

## 2022-03-05 ENCOUNTER — Encounter: Payer: Self-pay | Admitting: Cardiology

## 2022-03-05 ENCOUNTER — Ambulatory Visit: Payer: Medicare Other | Attending: Cardiology | Admitting: Cardiology

## 2022-03-05 VITALS — BP 96/48 | HR 87 | Ht 67.0 in | Wt 135.2 lb

## 2022-03-05 DIAGNOSIS — I255 Ischemic cardiomyopathy: Secondary | ICD-10-CM | POA: Diagnosis not present

## 2022-03-05 DIAGNOSIS — I9589 Other hypotension: Secondary | ICD-10-CM | POA: Diagnosis not present

## 2022-03-05 DIAGNOSIS — E782 Mixed hyperlipidemia: Secondary | ICD-10-CM | POA: Diagnosis not present

## 2022-03-05 DIAGNOSIS — Z95810 Presence of automatic (implantable) cardiac defibrillator: Secondary | ICD-10-CM | POA: Diagnosis not present

## 2022-03-05 DIAGNOSIS — I2510 Atherosclerotic heart disease of native coronary artery without angina pectoris: Secondary | ICD-10-CM

## 2022-03-05 MED ORDER — ENTRESTO 24-26 MG PO TABS: 1.0000 | ORAL_TABLET | Freq: Two times a day (BID) | ORAL | 3 refills | Status: DC

## 2022-03-05 MED ORDER — METOPROLOL SUCCINATE ER 25 MG PO TB24: 25.0000 mg | ORAL_TABLET | Freq: Every day | ORAL | 3 refills | Status: DC

## 2022-03-05 NOTE — Patient Instructions (Signed)
Medication Instructions:  Your physician has recommended you make the following change in your medication:   START: Toprol XL 25 mg daily START: Entresto 24-26 twice daily STOP: Carvedilol  *If you need a refill on your cardiac medications before your next appointment, please call your pharmacy*   Lab Work: None If you have labs (blood work) drawn today and your tests are completely normal, you will receive your results only by: MyChart Message (if you have MyChart) OR A paper copy in the mail If you have any lab test that is abnormal or we need to change your treatment, we will call you to review the results.   Testing/Procedures: None   Follow-Up: At [facility], you and your health needs are our priority.  As part of our continuing mission to provide you with exceptional heart care, we have created designated Provider Care Teams.  These Care Teams include your primary Cardiologist (physician) and Advanced Practice Providers (APPs -  Physician Assistants and Nurse Practitioners) who all work together to provide you with the care you need, when you need it.  We recommend signing up for the patient portal called "MyChart".  Sign up information is provided on this After Visit Summary.  MyChart is used to connect with patients for Virtual Visits (Telemedicine).  Patients are able to view lab/test results, encounter notes, upcoming appointments, etc.  Non-urgent messages can be sent to your provider as well.   To learn more about what you can do with MyChart, go to [email or web address].    Your next appointment:   6 month(s)  The format for your next appointment:   In Person  Provider:   Norman Herrlich, MD    Other Instructions Buy a digital Om[employer] blood pressure device  Check blood pressure sitting and standing daily call if < 100 frequently.  Important Information About Sugar

## 2022-03-07 ENCOUNTER — Telehealth: Payer: Self-pay | Admitting: Cardiology

## 2022-03-07 ENCOUNTER — Telehealth: Payer: Self-pay

## 2022-03-07 ENCOUNTER — Other Ambulatory Visit: Payer: Self-pay

## 2022-03-07 NOTE — Chronic Care Management (AMB) (Signed)
Patient Assistance Coordination  Patient Jeffrey Parsons has been increased to 24-26 mg taking 1 tablet 2 times a day, patient has an enrollment with [employer] patient assistance program, filling out new prescription form for provider to sign, emailing to Print production planner for provider to sign and fax.  Billee Cashing, CMA Clinical Pharmacist Assistant [phone]

## 2022-03-07 NOTE — Progress Notes (Signed)
Chronic Care Management Pharmacy Assistant   Name: Jeffrey Parsons  MRN: [id number] DOB: [birth year]   Reason for Encounter: Disease State call for DM    Recent office visits:  02/18/22 Flonnie Hailstone NP. Seen for UTI. Started on Augmentin and Fluconazole.   02/05/22 [name] MD. Seen for routine visit. Changed Insulin Glargine to 11 units daily and D/C Semaglutide 14mg .   Recent consult visits:  03/05/22 (Cardiology) 05/05/22 MD. Seen for routine visit. Started on Metoprolol Succinate 25mg . Decreased Sacubitril-Valsartan to 24-26 mg. D/C Amantadine HCI 100mg  and Carvedilol 12.5mg  and Spironolactone 25mg .   02/25/22 (Neurology) FNP. Seen for Parkinson's Disease. Please take an extra Carbidopa/levodopa 25/250 at 6pm. Ordered Mirtazapine 15mg . I placed a PT referral for gait and balance.   Hospital visits:  None  Medications: Outpatient Encounter Medications as of 03/07/2022  Medication Sig   aspirin EC 81 MG tablet Take 1 tablet (81 mg total) by mouth daily.   carbidopa-levodopa (SINEMET CR) 50-200 MG tablet Take 1 tablet by mouth every evening.    carbidopa-levodopa (SINEMET IR) 25-250 MG tablet Take 1 tablet by mouth 4 (four) times daily.    Cyanocobalamin 1000 MCG CAPS Take 1 tablet by mouth daily.   ezetimibe (ZETIA) 10 MG tablet Take 1 tablet (10 mg total) by mouth daily.   insulin glargine (LANTUS) 100 UNIT/ML injection Inject 11 Units into the skin daily.   JARDIANCE 10 MG TABS tablet TAKE 1 TABLET BY MOUTH DAILY BEFORE BREAKFAST.   metFORMIN (GLUCOPHAGE) 500 MG tablet TAKE 2 TABLETS (1,000 MG TOTAL) 2 (TWO) TIMES DAILY WITH A MEAL. START THIS WITH NEW INSULIN   metoprolol succinate (TOPROL XL) 25 MG 24 hr tablet Take 1 tablet (25 mg total) by mouth daily.   mirtazapine (REMERON) 15 MG tablet Take 7.5 mg by mouth at bedtime.   Multiple Vitamin (MULTIVITAMIN) tablet Take 1 tablet by mouth daily.   oxybutynin (DITROPAN) 5 MG tablet Take 5 mg by  mouth daily.   rosuvastatin (CRESTOR) 10 MG tablet Take 1 tablet by mouth daily before breakfast.   sacubitril-valsartan (ENTRESTO) 24-26 MG Take 1 tablet by mouth 2 (two) times daily.   tamsulosin (FLOMAX) 0.4 MG CAPS capsule SMARTSIG:1 Capsule(s) By Mouth Every Evening   No facility-administered encounter medications on file as of 03/07/2022.    Recent Relevant Labs: Lab Results  Component Value Date/Time   HGBA1C 7.1 (H) 01/31/2022 08:33 AM   HGBA1C 8.1 (H) 10/29/2021 09:16 AM   MICROALBUR 80 01/17/2020 08:38 AM    Kidney Function Lab Results  Component Value Date/Time   CREATININE 1.02 01/31/2022 08:33 AM   CREATININE 0.83 10/29/2021 09:16 AM   GFRNONAA 76 05/03/2020 10:04 AM   GFRAA 87 05/03/2020 10:04 AM     Current antihyperglycemic regimen:  Lantus Inject 11 units daily  Metformin 1000 mg bid  Empagliflozin 10mg  daily  Patient verbally confirms he is taking the above medications as directed. Yes  What recent interventions/DTPs have been made to improve glycemic control:  Dr. 01/19/2020 changed Lantus to 11 units daily   Have there been any recent hospitalizations or ED visits since last visit with CPP? No  Patient denies hypoglycemic symptoms, including None  Patient denies hyperglycemic symptoms, including none  How often are you checking your blood sugar? 3-4 times daily  What are your blood sugars ranging?  Fasting: 88, 91, 132, 110  After meals: 258, 259  On insulin? Yes How many units: :10-14 units  During the week, how often does your blood glucose drop below 70? Never  Are you checking your feet daily/regularly? Yes  Adherence Review: Is the patient currently on a STATIN medication? No Is the patient currently on ACE/ARB medication? Yes Does the patient have >5 day gap between last estimated fill dates? CPP to review  Care Gaps: Last eye exam / Retinopathy Screening? 02/17/22 Last Annual Wellness Visit? None noted  Last Diabetic Foot Exam?  02/05/22   Star Rating Drugs:  Medication:  Last Fill: Day Supply Metformin                   02/01/22- 11/06/21 90ds Empagliflozin              02/10/22- 11/12/21 90ds   Roxana Hires, CMA Clinical Pharmacist Assistant  [phone]

## 2022-03-07 NOTE — Telephone Encounter (Signed)
Wife is calling to follow-up on patient's Entresto prescription.  Wife stated the patient has had assistance with getting this prescription.

## 2022-03-07 NOTE — Telephone Encounter (Signed)
Called the patient's wife and she stated that the patient's Entresto patient assistance paperwork was given to Dr. Lamar Sprinkles office because they have a pharmacist that is able to work with her and help get the patient's Entresto. She also stated that it may take to long to get the medication and he may run out of it. I looked to see if we have samples and the office did not have samples for the patient's dose of Entresto. I recommended that since Dr. Lamar Sprinkles office has the patient assistance paperwork, that she should contact them to see about help getting the patient Entresto. Patient was agreeable with this and had no further questions at this time.

## 2022-03-11 DIAGNOSIS — R339 Retention of urine, unspecified: Secondary | ICD-10-CM | POA: Diagnosis not present

## 2022-03-11 DIAGNOSIS — R351 Nocturia: Secondary | ICD-10-CM | POA: Diagnosis not present

## 2022-03-11 DIAGNOSIS — N401 Enlarged prostate with lower urinary tract symptoms: Secondary | ICD-10-CM | POA: Diagnosis not present

## 2022-03-12 ENCOUNTER — Telehealth: Payer: Self-pay

## 2022-03-12 NOTE — Chronic Care Management (AMB) (Signed)
Wife called regarding increase of Entresto patient assistance she was told to follow up with company on Monday regarding increase. Patient's wife aware that increase prescription was sent to the office last week but Dr. Marina Goodell was unable to sign form until this week and we will get that faxed over. Wife aware and understands, they were able to get some samples from Cardiology that would last them a few weeks.  Billee Cashing, CMA Clinical Pharmacist Assistant [phone]

## 2022-03-13 ENCOUNTER — Encounter: Payer: Self-pay | Admitting: Nurse Practitioner

## 2022-03-13 ENCOUNTER — Telehealth: Payer: Self-pay

## 2022-03-13 NOTE — Chronic Care Management (AMB) (Signed)
Faxed [employer] patient assistance form for increase of Entresto.  Billee Cashing, CMA Clinical Pharmacist Assistant [phone]

## 2022-03-25 ENCOUNTER — Telehealth: Payer: Self-pay

## 2022-03-25 NOTE — Chronic Care Management (AMB) (Signed)
Patient Assistance Coordination   Patient still waiting on new dose of Entresto from [employer] patient assistance program. Called patient to give contact number to [employer] [phone]. Per Wife, she called them on Friday and they told her they only had the old strength prescription on file Entresto 49-51 mg.   Called [employer] to follow up, new prescription dose was faxed on 03/13/2022, spoke with representative, they do have the 24-26 mg dose on file and fill request will need to go to the pharmacy, I was transferred over to [place]. Spoke to [place], Education administrator and she confirmed Entresto 24-26 mg prescription is ready for fill but patient will need to call for shipping.  Called patient's wife back to inform, she will call them now for shipment.    Pattricia Boss, [place] Pharmacist Assistant [phone]

## 2022-03-25 NOTE — Chronic Care Management (AMB) (Signed)
**  CORRECTION:  Error in previous note, Entresto dose was decreased from 49-51 mg 1 tablet twice daily to 24-26 mg 1 tablet twice daily.  Pattricia Boss, [place] Pharmacist Assistant [phone]'

## 2022-03-31 DIAGNOSIS — R2681 Unsteadiness on feet: Secondary | ICD-10-CM | POA: Diagnosis not present

## 2022-03-31 DIAGNOSIS — G20A1 Parkinson's disease without dyskinesia, without mention of fluctuations: Secondary | ICD-10-CM | POA: Diagnosis not present

## 2022-03-31 DIAGNOSIS — M25662 Stiffness of left knee, not elsewhere classified: Secondary | ICD-10-CM | POA: Diagnosis not present

## 2022-03-31 DIAGNOSIS — M25661 Stiffness of right knee, not elsewhere classified: Secondary | ICD-10-CM | POA: Diagnosis not present

## 2022-03-31 DIAGNOSIS — R293 Abnormal posture: Secondary | ICD-10-CM | POA: Diagnosis not present

## 2022-04-01 ENCOUNTER — Other Ambulatory Visit: Payer: Self-pay | Admitting: Nurse Practitioner

## 2022-04-01 DIAGNOSIS — G20A2 Parkinson's disease without dyskinesia, with fluctuations: Secondary | ICD-10-CM

## 2022-04-04 ENCOUNTER — Encounter: Payer: Self-pay | Admitting: Cardiology

## 2022-04-04 DIAGNOSIS — I255 Ischemic cardiomyopathy: Secondary | ICD-10-CM

## 2022-04-07 ENCOUNTER — Telehealth: Payer: Self-pay

## 2022-04-07 DIAGNOSIS — M25661 Stiffness of right knee, not elsewhere classified: Secondary | ICD-10-CM | POA: Diagnosis not present

## 2022-04-07 DIAGNOSIS — G20A1 Parkinson's disease without dyskinesia, without mention of fluctuations: Secondary | ICD-10-CM | POA: Diagnosis not present

## 2022-04-07 DIAGNOSIS — M25662 Stiffness of left knee, not elsewhere classified: Secondary | ICD-10-CM | POA: Diagnosis not present

## 2022-04-07 DIAGNOSIS — R2681 Unsteadiness on feet: Secondary | ICD-10-CM | POA: Diagnosis not present

## 2022-04-07 DIAGNOSIS — R293 Abnormal posture: Secondary | ICD-10-CM | POA: Diagnosis not present

## 2022-04-07 NOTE — Telephone Encounter (Signed)
Angelita Ingles, nurse with [facility] called stating patient is not a candidate for home health states he is currently not homebound, goes to the gym 3 times a week and out to eat. Should he become homebound he can be re-referred again.

## 2022-04-08 ENCOUNTER — Telehealth: Payer: Self-pay

## 2022-04-08 NOTE — Progress Notes (Signed)
Chronic Care Management Pharmacy Assistant   Name: Tyrel Lex  MRN: [id number] DOB: Mar 20, 1951   Reason for Encounter: Disease State call for DM    Recent office visits:  None  Recent consult visits:  None  Hospital visits:  None  Medications: Outpatient Encounter Medications as of 04/08/2022  Medication Sig   aspirin EC 81 MG tablet Take 1 tablet (81 mg total) by mouth daily.   carbidopa-levodopa (SINEMET CR) 50-200 MG tablet Take 1 tablet by mouth every evening.    carbidopa-levodopa (SINEMET IR) 25-250 MG tablet Take 1 tablet by mouth 4 (four) times daily.    Cyanocobalamin 1000 MCG CAPS Take 1 tablet by mouth daily.   ezetimibe (ZETIA) 10 MG tablet Take 1 tablet (10 mg total) by mouth daily.   insulin glargine (LANTUS) 100 UNIT/ML injection Inject 11 Units into the skin daily.   JARDIANCE 10 MG TABS tablet TAKE 1 TABLET BY MOUTH DAILY BEFORE BREAKFAST.   metFORMIN (GLUCOPHAGE) 500 MG tablet TAKE 2 TABLETS (1,000 MG TOTAL) 2 (TWO) TIMES DAILY WITH A MEAL. START THIS WITH NEW INSULIN   metoprolol succinate (TOPROL XL) 25 MG 24 hr tablet Take 1 tablet (25 mg total) by mouth daily.   mirtazapine (REMERON) 15 MG tablet Take 7.5 mg by mouth at bedtime.   Multiple Vitamin (MULTIVITAMIN) tablet Take 1 tablet by mouth daily.   oxybutynin (DITROPAN) 5 MG tablet Take 5 mg by mouth daily.   rosuvastatin (CRESTOR) 10 MG tablet Take 1 tablet by mouth daily before breakfast.   sacubitril-valsartan (ENTRESTO) 24-26 MG Take 1 tablet by mouth 2 (two) times daily.   tamsulosin (FLOMAX) 0.4 MG CAPS capsule SMARTSIG:1 Capsule(s) By Mouth Every Evening   No facility-administered encounter medications on file as of 04/08/2022.    Recent Relevant Labs: Lab Results  Component Value Date/Time   HGBA1C 7.1 (H) 01/31/2022 08:33 AM   HGBA1C 8.1 (H) 10/29/2021 09:16 AM   MICROALBUR 80 01/17/2020 08:38 AM    Kidney Function Lab Results  Component Value Date/Time   CREATININE 1.02  01/31/2022 08:33 AM   CREATININE 0.83 10/29/2021 09:16 AM   GFRNONAA 76 05/03/2020 10:04 AM   GFRAA 87 05/03/2020 10:04 AM     Current antihyperglycemic regimen:  Lantus Inject 11 units daily  Metformin 1000 mg bid  Empagliflozin 10mg  daily  Patient verbally confirms he is taking the above medications as directed. Yes  What recent interventions/DTPs have been made to improve glycemic control:  No changes   Have there been any recent hospitalizations or ED visits since last visit with CPP? No  Patient denies hypoglycemic symptoms, including None  Patient denies hyperglycemic symptoms, including none  How often are you checking your blood sugar? 3-4 times daily  What are your blood sugars ranging?  04/06/22 74, 123, 114, 110,141 04/05/22 151, 241, 198, 186, 219, 218, 245, 139, (62, 69 during the night)  04/07/22 91, 124, 141, 197,  117  On insulin? Yes How many units: 8 units   During the week, how often does your blood glucose drop below 70? Twice a week  Are you checking your feet daily/regularly? Yes  Adherence Review: Is the patient currently on a STATIN medication? Yes Is the patient currently on ACE/ARB medication? Yes Does the patient have >5 day gap between last estimated fill dates? CPP to review  Care Gaps: Last eye exam / Retinopathy Screening? 02/17/22 Last Annual Wellness Visit? None noted  Last Diabetic Foot Exam? 02/05/22  Star Rating Drugs:  Medication:  Last Fill: Day Supply Metformin                   02/01/22- 11/06/21 90ds Empagliflozin              02/10/22- 11/12/21 90ds   Roxana Hires, CMA Clinical Pharmacist Assistant  [phone]

## 2022-04-09 ENCOUNTER — Ambulatory Visit (INDEPENDENT_AMBULATORY_CARE_PROVIDER_SITE_OTHER): Payer: Medicare Other

## 2022-04-09 DIAGNOSIS — I255 Ischemic cardiomyopathy: Secondary | ICD-10-CM | POA: Diagnosis not present

## 2022-04-09 DIAGNOSIS — G20A1 Parkinson's disease without dyskinesia, without mention of fluctuations: Secondary | ICD-10-CM | POA: Diagnosis not present

## 2022-04-09 DIAGNOSIS — R2681 Unsteadiness on feet: Secondary | ICD-10-CM | POA: Diagnosis not present

## 2022-04-09 DIAGNOSIS — M25662 Stiffness of left knee, not elsewhere classified: Secondary | ICD-10-CM | POA: Diagnosis not present

## 2022-04-09 DIAGNOSIS — M25661 Stiffness of right knee, not elsewhere classified: Secondary | ICD-10-CM | POA: Diagnosis not present

## 2022-04-09 DIAGNOSIS — R293 Abnormal posture: Secondary | ICD-10-CM | POA: Diagnosis not present

## 2022-04-09 LAB — CUP PACEART REMOTE DEVICE CHECK
Battery Remaining Longevity: 35 mo
Battery Voltage: 2.95 V
Brady Statistic AP VP Percent: 0.32 %
Brady Statistic AP VS Percent: 0.01 %
Brady Statistic AS VP Percent: 98.65 %
Brady Statistic AS VS Percent: 1.02 %
Brady Statistic RA Percent Paced: 0.33 %
Brady Statistic RV Percent Paced: 98.34 %
Date Time Interrogation Session: 20231011043623
HighPow Impedance: 55 Ohm
Implantable Lead Implant Date: 20111213
Implantable Lead Implant Date: 20191230
Implantable Lead Implant Date: 20191230
Implantable Lead Location: 753858
Implantable Lead Location: 753859
Implantable Lead Location: 753860
Implantable Lead Model: 5076
Implantable Pulse Generator Implant Date: 20191230
Lead Channel Impedance Value: 199.5 Ohm
Lead Channel Impedance Value: 208.568
Lead Channel Impedance Value: 234.706
Lead Channel Impedance Value: 234.706
Lead Channel Impedance Value: 247.358
Lead Channel Impedance Value: 342 Ohm
Lead Channel Impedance Value: 399 Ohm
Lead Channel Impedance Value: 399 Ohm
Lead Channel Impedance Value: 399 Ohm
Lead Channel Impedance Value: 437 Ohm
Lead Channel Impedance Value: 437 Ohm
Lead Channel Impedance Value: 570 Ohm
Lead Channel Impedance Value: 665 Ohm
Lead Channel Impedance Value: 665 Ohm
Lead Channel Impedance Value: 722 Ohm
Lead Channel Impedance Value: 836 Ohm
Lead Channel Impedance Value: 874 Ohm
Lead Channel Impedance Value: 874 Ohm
Lead Channel Pacing Threshold Amplitude: 0.625 V
Lead Channel Pacing Threshold Amplitude: 0.625 V
Lead Channel Pacing Threshold Amplitude: 0.75 V
Lead Channel Pacing Threshold Pulse Width: 0.4 ms
Lead Channel Pacing Threshold Pulse Width: 0.4 ms
Lead Channel Pacing Threshold Pulse Width: 0.4 ms
Lead Channel Sensing Intrinsic Amplitude: 12.875 mV
Lead Channel Sensing Intrinsic Amplitude: 12.875 mV
Lead Channel Sensing Intrinsic Amplitude: 2 mV
Lead Channel Sensing Intrinsic Amplitude: 2 mV
Lead Channel Setting Pacing Amplitude: 1.25 V
Lead Channel Setting Pacing Amplitude: 1.5 V
Lead Channel Setting Pacing Amplitude: 2 V
Lead Channel Setting Pacing Pulse Width: 0.4 ms
Lead Channel Setting Pacing Pulse Width: 0.4 ms
Lead Channel Setting Sensing Sensitivity: 0.45 mV

## 2022-04-09 NOTE — Telephone Encounter (Signed)
Decrease insulin glarnine to 10 unit from 11 units to prevent nocturia hypoglycemia. lp

## 2022-04-13 NOTE — Telephone Encounter (Signed)
Patient was informed.

## 2022-04-14 DIAGNOSIS — M25661 Stiffness of right knee, not elsewhere classified: Secondary | ICD-10-CM | POA: Diagnosis not present

## 2022-04-14 DIAGNOSIS — G20A1 Parkinson's disease without dyskinesia, without mention of fluctuations: Secondary | ICD-10-CM | POA: Diagnosis not present

## 2022-04-14 DIAGNOSIS — R2681 Unsteadiness on feet: Secondary | ICD-10-CM | POA: Diagnosis not present

## 2022-04-14 DIAGNOSIS — M25662 Stiffness of left knee, not elsewhere classified: Secondary | ICD-10-CM | POA: Diagnosis not present

## 2022-04-14 DIAGNOSIS — R293 Abnormal posture: Secondary | ICD-10-CM | POA: Diagnosis not present

## 2022-04-16 DIAGNOSIS — G20A1 Parkinson's disease without dyskinesia, without mention of fluctuations: Secondary | ICD-10-CM | POA: Diagnosis not present

## 2022-04-16 DIAGNOSIS — R2681 Unsteadiness on feet: Secondary | ICD-10-CM | POA: Diagnosis not present

## 2022-04-16 DIAGNOSIS — M25661 Stiffness of right knee, not elsewhere classified: Secondary | ICD-10-CM | POA: Diagnosis not present

## 2022-04-16 DIAGNOSIS — M25662 Stiffness of left knee, not elsewhere classified: Secondary | ICD-10-CM | POA: Diagnosis not present

## 2022-04-16 DIAGNOSIS — R293 Abnormal posture: Secondary | ICD-10-CM | POA: Diagnosis not present

## 2022-04-17 ENCOUNTER — Ambulatory Visit: Payer: Medicare Other | Attending: Cardiology

## 2022-04-17 DIAGNOSIS — I255 Ischemic cardiomyopathy: Secondary | ICD-10-CM | POA: Insufficient documentation

## 2022-04-17 LAB — ECHOCARDIOGRAM COMPLETE
Area-P 1/2: 5.66 cm2
Calc EF: 29.4 %
MV M vel: 4.28 m/s
MV Peak grad: 73.3 mmHg
S' Lateral: 5.4 cm
Single Plane A2C EF: 35.2 %
Single Plane A4C EF: 20.9 %

## 2022-04-21 ENCOUNTER — Telehealth: Payer: Self-pay

## 2022-04-21 DIAGNOSIS — M25661 Stiffness of right knee, not elsewhere classified: Secondary | ICD-10-CM | POA: Diagnosis not present

## 2022-04-21 DIAGNOSIS — R293 Abnormal posture: Secondary | ICD-10-CM | POA: Diagnosis not present

## 2022-04-21 DIAGNOSIS — M25662 Stiffness of left knee, not elsewhere classified: Secondary | ICD-10-CM | POA: Diagnosis not present

## 2022-04-21 DIAGNOSIS — R2681 Unsteadiness on feet: Secondary | ICD-10-CM | POA: Diagnosis not present

## 2022-04-21 DIAGNOSIS — G20A1 Parkinson's disease without dyskinesia, without mention of fluctuations: Secondary | ICD-10-CM | POA: Diagnosis not present

## 2022-04-21 NOTE — Telephone Encounter (Signed)
LVM and My chart message regarding Echo results. Encouraged to call with any problems or concerns

## 2022-04-23 DIAGNOSIS — R2681 Unsteadiness on feet: Secondary | ICD-10-CM | POA: Diagnosis not present

## 2022-04-23 DIAGNOSIS — M25662 Stiffness of left knee, not elsewhere classified: Secondary | ICD-10-CM | POA: Diagnosis not present

## 2022-04-23 DIAGNOSIS — G20A1 Parkinson's disease without dyskinesia, without mention of fluctuations: Secondary | ICD-10-CM | POA: Diagnosis not present

## 2022-04-23 DIAGNOSIS — R293 Abnormal posture: Secondary | ICD-10-CM | POA: Diagnosis not present

## 2022-04-23 DIAGNOSIS — M25661 Stiffness of right knee, not elsewhere classified: Secondary | ICD-10-CM | POA: Diagnosis not present

## 2022-04-23 NOTE — Progress Notes (Signed)
Remote ICD transmission.   

## 2022-04-30 DIAGNOSIS — M25662 Stiffness of left knee, not elsewhere classified: Secondary | ICD-10-CM | POA: Diagnosis not present

## 2022-04-30 DIAGNOSIS — M25661 Stiffness of right knee, not elsewhere classified: Secondary | ICD-10-CM | POA: Diagnosis not present

## 2022-04-30 DIAGNOSIS — R2681 Unsteadiness on feet: Secondary | ICD-10-CM | POA: Diagnosis not present

## 2022-04-30 DIAGNOSIS — G20A1 Parkinson's disease without dyskinesia, without mention of fluctuations: Secondary | ICD-10-CM | POA: Diagnosis not present

## 2022-04-30 DIAGNOSIS — R293 Abnormal posture: Secondary | ICD-10-CM | POA: Diagnosis not present

## 2022-05-05 DIAGNOSIS — R293 Abnormal posture: Secondary | ICD-10-CM | POA: Diagnosis not present

## 2022-05-05 DIAGNOSIS — M25661 Stiffness of right knee, not elsewhere classified: Secondary | ICD-10-CM | POA: Diagnosis not present

## 2022-05-05 DIAGNOSIS — M25662 Stiffness of left knee, not elsewhere classified: Secondary | ICD-10-CM | POA: Diagnosis not present

## 2022-05-05 DIAGNOSIS — G20A1 Parkinson's disease without dyskinesia, without mention of fluctuations: Secondary | ICD-10-CM | POA: Diagnosis not present

## 2022-05-05 DIAGNOSIS — R2681 Unsteadiness on feet: Secondary | ICD-10-CM | POA: Diagnosis not present

## 2022-05-07 ENCOUNTER — Ambulatory Visit (INDEPENDENT_AMBULATORY_CARE_PROVIDER_SITE_OTHER): Payer: Medicare Other

## 2022-05-07 DIAGNOSIS — G20A2 Parkinson's disease without dyskinesia, with fluctuations: Secondary | ICD-10-CM

## 2022-05-07 DIAGNOSIS — R293 Abnormal posture: Secondary | ICD-10-CM | POA: Diagnosis not present

## 2022-05-07 DIAGNOSIS — R2681 Unsteadiness on feet: Secondary | ICD-10-CM | POA: Diagnosis not present

## 2022-05-07 DIAGNOSIS — E782 Mixed hyperlipidemia: Secondary | ICD-10-CM

## 2022-05-07 DIAGNOSIS — M25662 Stiffness of left knee, not elsewhere classified: Secondary | ICD-10-CM | POA: Diagnosis not present

## 2022-05-07 DIAGNOSIS — M25661 Stiffness of right knee, not elsewhere classified: Secondary | ICD-10-CM | POA: Diagnosis not present

## 2022-05-07 DIAGNOSIS — I10 Essential (primary) hypertension: Secondary | ICD-10-CM

## 2022-05-07 DIAGNOSIS — G20A1 Parkinson's disease without dyskinesia, without mention of fluctuations: Secondary | ICD-10-CM | POA: Diagnosis not present

## 2022-05-07 NOTE — Patient Instructions (Signed)
Visit Information   Goals Addressed   None    Patient Care Plan: CCM Pharmacy Care Plan     Problem Identified: CAD, Diabetes, Hypertension   Priority: High  Onset Date: 08/07/2020     Long-Range Goal: Disease Management   Start Date: 08/07/2020  Expected End Date: 08/07/2021  Recent Progress: On track  Priority: High  Note:   Current Barriers:  Unable to achieve control of Diabetes   Pharmacist Clinical Goal(s):  Patient will achieve control of diabetes as evidenced by a1c and blood sugar readings through collaboration with PharmD and provider.   Interventions: 1:1 collaboration with [name], MD regarding development and update of comprehensive plan of care as evidenced by provider attestation and co-signature Inter-disciplinary care team collaboration (see longitudinal plan of care) Comprehensive medication review performed; medication list updated in electronic medical record  CAD: (LDL goal < 55) -Managed by Dr. Bettina Gavia The ASCVD Risk score (Arnett DK, et al., 2019) failed to calculate for the following reasons:   The patient has a prior MI or stroke diagnosis Lab Results  Component Value Date   CHOL 181 01/31/2022   CHOL 149 10/29/2021   CHOL 150 06/14/2021   Lab Results  Component Value Date   HDL 45 01/31/2022   HDL 52 10/29/2021   HDL 48 06/14/2021   Lab Results  Component Value Date   LDLCALC 117 (H) 01/31/2022   LDLCALC 85 10/29/2021   LDLCALC 86 06/14/2021   Lab Results  Component Value Date   TRIG 103 01/31/2022   TRIG 61 10/29/2021   TRIG 87 06/14/2021   Lab Results  Component Value Date   CHOLHDL 4.0 01/31/2022   CHOLHDL 2.9 10/29/2021   CHOLHDL 3.1 06/14/2021  No results found for: "LDLDIRECT" -Controlled -Current treatment:  rosuvastatin 10 mg daily Appropriate, Effective, Safe, Accessible -Medications previously tried: atorvastatin  -Current dietary patterns: eating healthy and trying to adhere to diabetic diet -Current  exercise habits: [employer] 5 days a week  -Educated on Cholesterol goals;  Benefits of statin for ASCVD risk reduction; Importance of limiting foods high in cholesterol; Exercise goal of 150 minutes per week; -Counseled on diet and exercise extensively Recommend continuing current medication   Diabetes (A1c goal <7.5%) Lab Results  Component Value Date   HGBA1C 7.1 (H) 01/31/2022   HGBA1C 8.1 (H) 10/29/2021   HGBA1C 8.6 (H) 06/14/2021   Lab Results  Component Value Date   MICROALBUR 80 01/17/2020   LDLCALC 117 (H) 01/31/2022   CREATININE 1.02 01/31/2022   Lab Results  Component Value Date   NA 134 01/31/2022   K 4.6 01/31/2022   CREATININE 1.02 01/31/2022   EGFR 79 01/31/2022   GFRNONAA 76 05/03/2020   GLUCOSE 213 (H) 01/31/2022   Lab Results  Component Value Date   WBC 6.5 01/31/2022   HGB 14.1 01/31/2022   HCT 40.9 01/31/2022   MCV 93 01/31/2022   PLT 187 01/31/2022  -Uncontrolled -Current medications: Lantus 8-9 SS units AM Appropriate, Query effective, Query Safe, Accessible Metformin 1000 mg bid Appropriate, Query effective, Query Safe, Accessible Freestyle Libre (PAP) Appropriate, Effective, Safe, Accessible Insulin pen needles Appropriate, Effective, Safe, Accessible Empagliflozin Appropriate, Effective, Safe, Query accessible PAP for 2023 -Medications previously tried: 70/30 insulin, Rybelsus (Cost) -Current home glucose readings fasting glucose:  October 2022: 64-152 mg/dL Jan 2023: 50-220 March 2023: Didn't have list due to recent infection April 2023: Normally 80-140 but has had a 60 and a 77 July 2023: 4 readings  below 70 this month.  November 2023: 2 readings below 70 past few weeks post prandial glucose: 140-225 mg/dL -Reports hypoglycemic/hyperglycemic symptoms -Current meal patterns:  breakfast: eggs lunch: trying to eat balanced meals dinner: meat and vegetables snacks: ensure/boost, peanut butter crackers, pretzels drinks: juice to  correct low blood sugar -Current exercise:  [employer] m-f when wife can drive and pt for shoulder/parkinson's -Educated on A1c and blood sugar goals; Complications of diabetes including kidney damage, retinal damage, and cardiovascular disease; Exercise goal of 150 minutes per week: July 2023: Exercising 50 minutes 5x/week on stationary bike Prevention and management of hypoglycemic episodes; Continuous glucose monitoring; -Counseled to check feet daily and get yearly eye exams -Counseled on diet and exercise extensively August 2022: Recommended patient continue to watch blood sugar closely. Patient to resume Farxiga 5 mg daily with Lantus 10 mg. Patient will call pharmacist with any concerns or questions. Pharmacist sending order for Farxiga 5 mg to [facility].  October 2022: Patient stopped Farxiga and sugars are swinging too high and low. He states that with his dose of insulin in the morning he is chasing it all day and sometimes the dose is too strong and he drops too low at night. There are a few options,  1: Add Short acting insulin so he isn't running hypo multiple times during the day due to the lantus B: Replace lantus with GLP (Which has less hypoglycemia). Then taper down insulin.  Will ask PCP if option B is agreeable, if so, will start PAP ASAP because his insurance won't cover it -Also, Libre PAP needs to be renewed, he told me the company will be reaching out to [place] so we can fax his note Hx Jan 2023: Sugars are too high and too low per patient. Below 80 5x/month but sometimes is in 200's as well. Tends to range 80-150. Dr. [name] gave patient Rybelsus last month but patient never started due to fear of ADR's. Counseled extensively about risks/rewards, he agreed to try it for 2 weeks. F/U then and if he's content on therapy, will start PAP for it. Also counsled that he may have to decrease insulin after starting new med and to call us if he drops too low so we can adjust  insulin Feb 2023: Patient content on Rybelsus but sugars are still low. Called Dr. [name], spoke with Lemmie Evens who gave verbal from Dr. [name] to decrease Lantus to 15 units. Patient will call if he goes low still. Started Rybelsus PAP March 2023: Waiting on Rybelsus PAP to come in, gave phone number for them to call 09/16/21. Due to recent sickness, patient hasn't adjusted sugars too much. F/U 1 month, hopefully PAP will be in by then April 2023: Has had a few Hypoglycemic episodes. Will let PCP know. Patient wife called for Rybelsus PAP, wife said company said something was missing. She was sleeping and didn't call the office to figure out how to fix this. Asked patient to have wife call ASAP July 2023: Patient now on Empagliflozin. Cost $300 for him to pick up. Counseled to not do that in the future without calling me and getting him samples. Will conduct PAP ASAP. Stated he is having low sugars 4x/month. He states he commonly skips dinner. He takes his insulin in AM. Counseled to take insulin with dinner and if he skips his dinner, to decrease the insulin. Will let PCP know November 2023: Patient has had more lows. Has F/u with Pcp next week. Counseled to bring in glucose  logs.  CHF -Managed by Dr. Bettina Gavia -ejection fraction December 2021 severely reduced 30 to 35% BP Readings from Last 3 Encounters:  03/05/22 (!) 96/48  02/18/22 (!) 92/50  02/05/22 (!) 92/56  -Managed by Cardio -Controlled -Current treatment  Entresto 24/26 (PAP) Appropriate, Effective, Safe, Accessible Furosemide PRN 14m Appropriate, Effective, Safe, Accessible Empagliflozin 198mAppropriate, Effective, Safe, Accessible Metoprolol Succ 2573mppropriate, Effective, Safe, Accessible -Medications previously tried: N/A  -Recommended to continue current medication  Parkinson (Goal: Slow progression) -Controlled -Current treatment  Carb/Lev 50-200 HS Appropriate, Effective, Safe, Accessible Carb/Lev 25/250 IR QID Appropriate,  Effective, Safe, Accessible Amantadine 100m52mpropriate, Effective, Safe, Accessible -Medications previously tried: N/A  -Recommended to continue current medication   Patient Goals/Self-Care Activities Patient will:  - take medications as prescribed focus on medication adherence by using pill box check glucose throughout the day with [facility], and provide at future appointments target a minimum of 150 minutes of moderate intensity exercise weekly  Follow Up Plan: Telephone follow up appointment with care management team member scheduled for: June 2024  NathArizona ConstablearSherian Rein- [phone]  Mr. WillShams given information about Chronic Care Management services today including:  CCM service includes personalized support from designated clinical staff supervised by his physician, including individualized plan of care and coordination with other care providers 24/7 contact phone numbers for assistance for urgent and routine care needs. Standard insurance, coinsurance, copays and deductibles apply for chronic care management only during months in which we provide at least 20 minutes of these services. Most insurances cover these services at 100%, however patients may be responsible for any copay, coinsurance and/or deductible if applicable. This service may help you avoid the need for more expensive face-to-face services. Only one practitioner may furnish and bill the service in a calendar month. The patient may stop CCM services at any time (effective at the end of the month) by phone call to the office staff.  Patient agreed to services and verbal consent obtained.   The patient verbalized understanding of instructions, educational materials, and care plan provided today and DECLINED offer to receive copy of patient instructions, educational materials, and care plan.  The pharmacy team will reach out to the patient again over the next 60 days.   NathLane HackerH [place]

## 2022-05-07 NOTE — Progress Notes (Signed)
Chronic Care Management Pharmacy Note  05/07/2022 Name:  Jeffrey Parsons MRN:  [id number] DOB:  [birth year]    Summary: Patient was reading Jeffrey Parsons, "Multiplying your God Given Potential" when I called him.   Recommendations for PCP Due for AWV. Will co-sign PCP to let him know Patient still experiencing lows. Counseled patient to bring in sugar logs to f/u with PCP next week   Subjective: [name] is an 71 y.o. year old male who is a primary patient of [name], Zeb Comfort, MD.  The CCM team was consulted for assistance with disease management and care coordination needs.    Engaged with patient by telephone for follow up visit in response to provider referral for pharmacy case management and/or care coordination services.   Consent to Services:  The patient was given information about Chronic Care Management services, agreed to services, and gave verbal consent prior to initiation of services.  Please see initial visit note for detailed documentation.   Patient Care Team: [name], MD as PCP - General (Family Medicine) Deboraha Sprang, MD as PCP - Electrophysiology (Cardiology) Richardo Priest, MD as PCP - Cardiology (Cardiology) Lane Hacker, [facility] (Pharmacist)  Recent office visits:  None   Recent consult visits:  None   Hospital visits:  None   Objective:  Lab Results  Component Value Date   CREATININE 1.02 01/31/2022   BUN 28 (H) 01/31/2022   GFRNONAA 76 05/03/2020   GFRAA 87 05/03/2020   NA 134 01/31/2022   K 4.6 01/31/2022   CALCIUM 9.0 01/31/2022   CO2 22 01/31/2022    Lab Results  Component Value Date/Time   HGBA1C 7.1 (H) 01/31/2022 08:33 AM   HGBA1C 8.1 (H) 10/29/2021 09:16 AM   MICROALBUR 80 01/17/2020 08:38 AM    Last diabetic Eye exam:  Lab Results  Component Value Date/Time   HMDIABEYEEXA Retinopathy (A) 02/17/2022 12:00 AM    Last diabetic Foot exam: No results found for: "HMDIABFOOTEX"   Lab Results   Component Value Date   CHOL 181 01/31/2022   HDL 45 01/31/2022   LDLCALC 117 (H) 01/31/2022   TRIG 103 01/31/2022   CHOLHDL 4.0 01/31/2022       Latest Ref Rng & Units 01/31/2022    8:33 AM 10/29/2021    9:16 AM 06/14/2021    8:24 AM  Hepatic Function  Total Protein 6.0 - 8.5 g/dL 6.7  6.6  6.4   Albumin 3.8 - 4.8 g/dL 4.5  4.4  4.2   AST 0 - 40 IU/L _0 ALT 0 - 44 IU/L _1 Alk Phosphatase 44 - 121 IU/L 103  103  135   Total Bilirubin 0.0 - 1.2 mg/dL 1.1  0.8  0.8     Lab Results  Component Value Date/Time   TSH 2.320 11/23/2020 09:30 AM   TSH 1.440 05/22/2020 08:49 AM       Latest Ref Rng & Units 01/31/2022    8:33 AM 10/29/2021    9:16 AM 06/14/2021    8:24 AM  CBC  WBC 3.4 - 10.8 x10E3/uL 6.5  6.0  5.9   Hemoglobin 13.0 - 17.7 g/dL 14.1  13.2  13.1   Hematocrit 37.5 - 51.0 % 40.9  38.4  39.5   Platelets 150 - 450 x10E3/uL 187  191  199     No results found for: "VD25OH"  Clinical ASCVD: Yes  The ASCVD  Risk score (Arnett DK, et al., 2019) failed to calculate for the following reasons:   The patient has a prior MI or stroke diagnosis       06/17/2021    8:59 AM 01/17/2020    8:30 AM  Depression screen PHQ 2/9  Decreased Interest 0 0  Down, Depressed, Hopeless 0 0  PHQ - 2 Score 0 0  Altered sleeping  0  Tired, decreased energy  1  Change in appetite  0  Feeling bad or failure about yourself   0  Trouble concentrating  0  Moving slowly or fidgety/restless  1  Suicidal thoughts  0  PHQ-9 Score  2  Difficult doing work/chores  Not difficult at all     Other: (CHADS2VASc if Afib, MMRC or CAT for COPD, ACT, DEXA)  Social History   Tobacco Use  Smoking Status Never  Smokeless Tobacco Never   BP Readings from Last 3 Encounters:  03/05/22 (!) 96/48  02/18/22 (!) 92/50  02/05/22 (!) 92/56   Pulse Readings from Last 3 Encounters:  03/05/22 87  02/18/22 83  02/05/22 85   Wt Readings from Last 3 Encounters:  03/05/22 135 lb 3.2 oz (61.3  kg)  02/18/22 130 lb 6.4 oz (59.1 kg)  02/05/22 132 lb (59.9 kg)    Assessment/Interventions: Review of patient past medical history, allergies, medications, health status, including review of consultants reports, laboratory and other test data, was performed as part of comprehensive evaluation and provision of chronic care management services.   SDOH:  (Social Determinants of Health) assessments and interventions performed: Yes SDOH Interventions    Flowsheet Row Chronic Care Management from 05/07/2022 in [place] Management from 01/16/2022 in [place] Management from 07/17/2021 in [place]  SDOH Interventions     Transportation Interventions Intervention Not Indicated -- --  Financial Strain Interventions Other (Comment)  [PAP (See CP)] Other (Comment)  [PAP] Other (Comment)  [PAP's for multiple meds]  Physical Activity Interventions -- Intervention Not Indicated --        CCM Care Plan  Allergies  Allergen Reactions   Farxiga [Dapagliflozin] Other (See Comments)    Patient lost weight.   Amoxicillin Nausea And Vomiting   Onion     Upset stomach   Sulfa Antibiotics Other (See Comments)    FLUSH     Medications Reviewed Today     Reviewed by Lane Hacker, [facility] (Pharmacist) on 05/07/22 at 1534  Med List Status: <None>   Medication Order Taking? Sig Documenting Provider Last Dose Status Informant  aspirin EC 81 MG tablet [id number] No Take 1 tablet (81 mg total) by mouth daily. Richardson Dopp T, PA-C Taking Active Multiple Informants  carbidopa-levodopa (SINEMET CR) 50-200 MG tablet [id number] No Take 1 tablet by mouth every evening.  [provider] Taking Active Multiple Informants  carbidopa-levodopa (SINEMET IR) 25-250 MG tablet [id number] No Take 1 tablet by mouth 4 (four) times daily.  [provider] Taking Active Multiple Informants  Cyanocobalamin 1000 MCG CAPS [id number] No Take 1 tablet by  mouth daily. [provider] Taking Active   ezetimibe (ZETIA) 10 MG tablet [id number] No Take 1 tablet (10 mg total) by mouth daily. [name], MD Taking Active   insulin glargine (LANTUS) 100 UNIT/ML injection [id number] No Inject 9 Units into the skin daily. [provider] Taking Active   JARDIANCE 10 MG TABS tablet [id number] No TAKE 1 TABLET BY MOUTH DAILY BEFORE  BREAKFAST. [name], MD Taking Active   metFORMIN (GLUCOPHAGE) 500 MG tablet [id number] No TAKE 2 TABLETS (1,000 MG TOTAL) 2 (TWO) TIMES DAILY WITH A MEAL. START THIS WITH NEW INSULIN [name], MD Taking Active   metoprolol succinate (TOPROL XL) 25 MG 24 hr tablet [id number]  Take 1 tablet (25 mg total) by mouth daily. Richardo Priest, MD  Active   mirtazapine (REMERON) 15 MG tablet [id number] No Take 7.5 mg by mouth at bedtime. [provider] Taking Active   Multiple Vitamin (MULTIVITAMIN) tablet [id number] No Take 1 tablet by mouth daily. [provider] Taking Active Multiple Informants  oxybutynin (DITROPAN) 5 MG tablet [id number] No Take 5 mg by mouth daily. [provider] Taking Active   rosuvastatin (CRESTOR) 10 MG tablet [id number] No Take 1 tablet by mouth daily before breakfast. [provider] Taking Active   sacubitril-valsartan (ENTRESTO) 24-26 MG [id number]  Take 1 tablet by mouth 2 (two) times daily. Richardo Priest, MD  Active   tamsulosin (FLOMAX) 0.4 MG CAPS capsule [id number] No SMARTSIG:1 Capsule(s) By Mouth Every Evening [provider] Taking Active             Patient Active Problem List   Diagnosis Date Noted   Myalgia due to HMG CoA reductase inhibitor 10/29/2021   Malnutrition of mild degree ([place]) 10/29/2021   Sepsis ([place]) 08/28/2021   Retinopathy 08/26/2021   Atherosclerosis of aorta ([place]) 01/10/2021   Abnormal weight loss 11/23/2020   BPH with obstruction/lower urinary tract symptoms 05/22/2020   BMI  22.0-22.9, adult 01/17/2020   Sick sinus syndrome ([place]) 09/13/2019   Obesity, diabetes, and hypertension syndrome ([place]) 09/13/2019   Secondary dystonia 07/04/2019   Essential hypertension 06/26/2018   Complete heart block (HCC) 05/26/2017   Cardiac device in situ 05/26/2017   Chronic combined systolic and diastolic CHF (congestive heart failure) ([place]) 05/26/2017   Ischemic cardiomyopathy 05/26/2017   AICD (automatic cardioverter/defibrillator) present 12/04/2016   S/P CABG x 5 09/12/2016   Mixed hyperlipidemia 09/12/2016   Coronary artery disease involving native coronary artery of native heart without angina pectoris 09/12/2016   Pacemaker 06/06/2016   Long term (current) use of aspirin 12/06/2015   Mild obesity 12/06/2015   Other long term (current) drug therapy 12/06/2015   Parkinson's disease 12/07/2014    Immunization History  Administered Date(s) Administered   Moderna Sars-Covid-2 Vaccination 08/03/2019, 08/31/2019    Conditions to be addressed/monitored:  Hyperlipidemia and Diabetes  Care Plan : [place]  Updates made by Lane Hacker, [place] since 05/07/2022 12:00 AM     Problem: CAD, Diabetes, Hypertension   Priority: High  Onset Date: 08/07/2020     Long-Range Goal: Disease Management   Start Date: 08/07/2020  Expected End Date: 08/07/2021  Recent Progress: On track  Priority: High  Note:   Current Barriers:  Unable to achieve control of Diabetes   Pharmacist Clinical Goal(s):  Patient will achieve control of diabetes as evidenced by a1c and blood sugar readings through collaboration with PharmD and provider.   Interventions: 1:1 collaboration with [name], MD regarding development and update of comprehensive plan of care as evidenced by provider attestation and co-signature Inter-disciplinary care team collaboration (see longitudinal plan of care) Comprehensive medication review performed; medication list updated in electronic  medical record  CAD: (LDL goal < 55) -Managed by Dr. Bettina Gavia The ASCVD Risk score (Arnett DK, et al., 2019) failed to calculate for the following reasons:  The patient has a prior MI or stroke diagnosis Lab Results  Component Value Date   CHOL 181 01/31/2022   CHOL 149 10/29/2021   CHOL 150 06/14/2021   Lab Results  Component Value Date   HDL 45 01/31/2022   HDL 52 10/29/2021   HDL 48 06/14/2021   Lab Results  Component Value Date   LDLCALC 117 (H) 01/31/2022   LDLCALC 85 10/29/2021   LDLCALC 86 06/14/2021   Lab Results  Component Value Date   TRIG 103 01/31/2022   TRIG 61 10/29/2021   TRIG 87 06/14/2021   Lab Results  Component Value Date   CHOLHDL 4.0 01/31/2022   CHOLHDL 2.9 10/29/2021   CHOLHDL 3.1 06/14/2021  No results found for: "LDLDIRECT" -Controlled -Current treatment:  rosuvastatin 10 mg daily Appropriate, Effective, Safe, Accessible -Medications previously tried: atorvastatin  -Current dietary patterns: eating healthy and trying to adhere to diabetic diet -Current exercise habits: [employer] 5 days a week  -Educated on Cholesterol goals;  Benefits of statin for ASCVD risk reduction; Importance of limiting foods high in cholesterol; Exercise goal of 150 minutes per week; -Counseled on diet and exercise extensively Recommend continuing current medication   Diabetes (A1c goal <7.5%) Lab Results  Component Value Date   HGBA1C 7.1 (H) 01/31/2022   HGBA1C 8.1 (H) 10/29/2021   HGBA1C 8.6 (H) 06/14/2021   Lab Results  Component Value Date   MICROALBUR 80 01/17/2020   LDLCALC 117 (H) 01/31/2022   CREATININE 1.02 01/31/2022   Lab Results  Component Value Date   NA 134 01/31/2022   K 4.6 01/31/2022   CREATININE 1.02 01/31/2022   EGFR 79 01/31/2022   GFRNONAA 76 05/03/2020   GLUCOSE 213 (H) 01/31/2022   Lab Results  Component Value Date   WBC 6.5 01/31/2022   HGB 14.1 01/31/2022   HCT 40.9 01/31/2022   MCV 93 01/31/2022   PLT 187  01/31/2022  -Uncontrolled -Current medications: Lantus 8-9 SS units AM Appropriate, Query effective, Query Safe, Accessible Metformin 1000 mg bid Appropriate, Query effective, Query Safe, Accessible Freestyle Libre (PAP) Appropriate, Effective, Safe, Accessible Insulin pen needles Appropriate, Effective, Safe, Accessible Empagliflozin Appropriate, Effective, Safe, Query accessible PAP for 2023 -Medications previously tried: 70/30 insulin, Rybelsus (Cost) -Current home glucose readings fasting glucose:  October 2022: 64-152 mg/dL Jan 2023: 50-220 March 2023: Didn't have list due to recent infection April 2023: Normally 80-140 but has had a 91 and a 77 July 2023: 4 readings below 70 this month.  November 2023: 2 readings below 70 past few weeks post prandial glucose: 140-225 mg/dL -Reports hypoglycemic/hyperglycemic symptoms -Current meal patterns:  breakfast: eggs lunch: trying to eat balanced meals dinner: meat and vegetables snacks: ensure/boost, peanut butter crackers, pretzels drinks: juice to correct low blood sugar -Current exercise:  [employer] m-f when wife can drive and pt for shoulder/parkinson's -Educated on A1c and blood sugar goals; Complications of diabetes including kidney damage, retinal damage, and cardiovascular disease; Exercise goal of 150 minutes per week: July 2023: Exercising 50 minutes 5x/week on stationary bike Prevention and management of hypoglycemic episodes; Continuous glucose monitoring; -Counseled to check feet daily and get yearly eye exams -Counseled on diet and exercise extensively August 2022: Recommended patient continue to watch blood sugar closely. Patient to resume Farxiga 5 mg daily with Lantus 10 mg. Patient will call pharmacist with any concerns or questions. Pharmacist sending order for Farxiga 5 mg to [facility].  October 2022: Patient stopped Farxiga and sugars are swinging too high  and low. He states that with his dose of insulin in  the morning he is chasing it all day and sometimes the dose is too strong and he drops too low at night. There are a few options,  1: Add Short acting insulin so he isn't running hypo multiple times during the day due to the lantus B: Replace lantus with GLP (Which has less hypoglycemia). Then taper down insulin.  Will ask PCP if option B is agreeable, if so, will start PAP ASAP because his insurance won't cover it -Also, Libre PAP needs to be renewed, he told me the company will be reaching out to [place] so we can fax his note Hx Jan 2023: Sugars are too high and too low per patient. Below 80 5x/month but sometimes is in 200's as well. Tends to range 80-150. Dr. [name] gave patient Rybelsus last month but patient never started due to fear of ADR's. Counseled extensively about risks/rewards, he agreed to try it for 2 weeks. F/U then and if he's content on therapy, will start PAP for it. Also counsled that he may have to decrease insulin after starting new med and to call us if he drops too low so we can adjust insulin Feb 2023: Patient content on Rybelsus but sugars are still low. Called Dr. [name], spoke with Lemmie Evens who gave verbal from Dr. [name] to decrease Lantus to 15 units. Patient will call if he goes low still. Started Rybelsus PAP March 2023: Waiting on Rybelsus PAP to come in, gave phone number for them to call 09/16/21. Due to recent sickness, patient hasn't adjusted sugars too much. F/U 1 month, hopefully PAP will be in by then April 2023: Has had a few Hypoglycemic episodes. Will let PCP know. Patient wife called for Rybelsus PAP, wife said company said something was missing. She was sleeping and didn't call the office to figure out how to fix this. Asked patient to have wife call ASAP July 2023: Patient now on Empagliflozin. Cost $300 for him to pick up. Counseled to not do that in the future without calling me and getting him samples. Will conduct PAP ASAP. Stated he is having low sugars 4x/month. He  states he commonly skips dinner. He takes his insulin in AM. Counseled to take insulin with dinner and if he skips his dinner, to decrease the insulin. Will let PCP know November 2023: Patient has had more lows. Has F/u with Pcp next week. Counseled to bring in glucose logs.  CHF -Managed by Dr. Bettina Gavia -ejection fraction December 2021 severely reduced 30 to 35% BP Readings from Last 3 Encounters:  03/05/22 (!) 96/48  02/18/22 (!) 92/50  02/05/22 (!) 92/56  -Managed by Cardio -Controlled -Current treatment  Entresto 24/26 (PAP) Appropriate, Effective, Safe, Accessible Furosemide PRN 44m Appropriate, Effective, Safe, Accessible Empagliflozin 190mAppropriate, Effective, Safe, Accessible Metoprolol Succ 2582mppropriate, Effective, Safe, Accessible -Medications previously tried: N/A  -Recommended to continue current medication  Parkinson (Goal: Slow progression) -Controlled -Current treatment  Carb/Lev 50-200 HS Appropriate, Effective, Safe, Accessible Carb/Lev 25/250 IR QID Appropriate, Effective, Safe, Accessible Amantadine 100m61mpropriate, Effective, Safe, Accessible -Medications previously tried: N/A  -Recommended to continue current medication   Patient Goals/Self-Care Activities Patient will:  - take medications as prescribed focus on medication adherence by using pill box check glucose throughout the day with [facility], and provide at future appointments target a minimum of 150 minutes of moderate intensity exercise weekly  Follow Up Plan: Telephone follow up appointment with care  management team member scheduled for: June 2024  [place] Constable, [place].D. - [phone]         Medication Assistance:  Delene Loll and Lantus obtained through [employer] medication assistance program.  Enrollment ends 06/29/2022   Rybelsus: Dnied Empagliflozin: Application started July 2023  Care Gaps: Last eye exam / Retinopathy Screening? 02/17/22 Last  Annual Wellness Visit? None noted  Last Diabetic Foot Exam? 02/05/22     Star Rating Drugs:  Medication:                Last Fill:         Day Supply Metformin                   02/01/22- 11/06/21 90ds Empagliflozin              02/10/22- 11/12/21 90ds   Patient's preferred pharmacy is:  [id number] [address] [place] [address] [place]: [phone]: [address] [address] [place]Phone: [phone]: [phone]  Uses pill box? Yes Pt endorses excellent compliance  We discussed: Benefits of medication synchronization, packaging and delivery as well as enhanced pharmacist oversight with [employer]. Patient decided to: Continue current medication management strategy  Care Plan and Follow Up Patient Decision:  Patient agrees to Care Plan and Follow-up.  Plan: Telephone follow up appointment with care management team member scheduled for:  June 2024  NArizona Constable PFloridaD. -- [phone]

## 2022-05-08 ENCOUNTER — Telehealth: Payer: Self-pay

## 2022-05-08 NOTE — Progress Notes (Signed)
05-08-2022: Completed and uploaded 2024 patient assistance application for Jardiance.  Huey Romans [facility] Clinical Pharmacist Assistant [phone]

## 2022-05-12 ENCOUNTER — Telehealth: Payer: Self-pay

## 2022-05-12 ENCOUNTER — Other Ambulatory Visit: Payer: Medicare Other

## 2022-05-12 DIAGNOSIS — I10 Essential (primary) hypertension: Secondary | ICD-10-CM | POA: Diagnosis not present

## 2022-05-12 DIAGNOSIS — E782 Mixed hyperlipidemia: Secondary | ICD-10-CM | POA: Diagnosis not present

## 2022-05-12 DIAGNOSIS — E669 Obesity, unspecified: Secondary | ICD-10-CM | POA: Diagnosis not present

## 2022-05-12 DIAGNOSIS — E1159 Type 2 diabetes mellitus with other circulatory complications: Secondary | ICD-10-CM | POA: Diagnosis not present

## 2022-05-12 DIAGNOSIS — I152 Hypertension secondary to endocrine disorders: Secondary | ICD-10-CM | POA: Diagnosis not present

## 2022-05-12 DIAGNOSIS — E1169 Type 2 diabetes mellitus with other specified complication: Secondary | ICD-10-CM | POA: Diagnosis not present

## 2022-05-12 NOTE — Telephone Encounter (Signed)
Patient brought home blood pressure readings for Dr Dulce Sellar to review.

## 2022-05-13 ENCOUNTER — Other Ambulatory Visit: Payer: Self-pay | Admitting: Legal Medicine

## 2022-05-13 ENCOUNTER — Encounter: Payer: Self-pay | Admitting: Legal Medicine

## 2022-05-13 ENCOUNTER — Ambulatory Visit (INDEPENDENT_AMBULATORY_CARE_PROVIDER_SITE_OTHER): Payer: Medicare Other | Admitting: Legal Medicine

## 2022-05-13 VITALS — BP 94/60 | HR 88 | Temp 98.8°F | Resp 14 | Ht 66.0 in | Wt 136.0 lb

## 2022-05-13 DIAGNOSIS — N401 Enlarged prostate with lower urinary tract symptoms: Secondary | ICD-10-CM

## 2022-05-13 DIAGNOSIS — I10 Essential (primary) hypertension: Secondary | ICD-10-CM | POA: Diagnosis not present

## 2022-05-13 DIAGNOSIS — G20B2 Parkinson's disease with dyskinesia, with fluctuations: Secondary | ICD-10-CM

## 2022-05-13 DIAGNOSIS — E1169 Type 2 diabetes mellitus with other specified complication: Secondary | ICD-10-CM

## 2022-05-13 DIAGNOSIS — E1159 Type 2 diabetes mellitus with other circulatory complications: Secondary | ICD-10-CM | POA: Diagnosis not present

## 2022-05-13 DIAGNOSIS — E669 Obesity, unspecified: Secondary | ICD-10-CM | POA: Diagnosis not present

## 2022-05-13 DIAGNOSIS — I152 Hypertension secondary to endocrine disorders: Secondary | ICD-10-CM

## 2022-05-13 DIAGNOSIS — N138 Other obstructive and reflux uropathy: Secondary | ICD-10-CM

## 2022-05-13 DIAGNOSIS — Z950 Presence of cardiac pacemaker: Secondary | ICD-10-CM | POA: Diagnosis not present

## 2022-05-13 DIAGNOSIS — E782 Mixed hyperlipidemia: Secondary | ICD-10-CM

## 2022-05-13 DIAGNOSIS — I2510 Atherosclerotic heart disease of native coronary artery without angina pectoris: Secondary | ICD-10-CM

## 2022-05-13 LAB — CBC WITH DIFFERENTIAL/PLATELET
Basophils Absolute: 0.1 10*3/uL (ref 0.0–0.2)
Basos: 1 %
EOS (ABSOLUTE): 0.5 10*3/uL — ABNORMAL HIGH (ref 0.0–0.4)
Eos: 8 %
Hematocrit: 41.5 % (ref 37.5–51.0)
Hemoglobin: 13.8 g/dL (ref 13.0–17.7)
Immature Grans (Abs): 0 10*3/uL (ref 0.0–0.1)
Immature Granulocytes: 0 %
Lymphocytes Absolute: 1.7 10*3/uL (ref 0.7–3.1)
Lymphs: 29 %
MCH: 31.7 pg (ref 26.6–33.0)
MCHC: 33.3 g/dL (ref 31.5–35.7)
MCV: 95 fL (ref 79–97)
Monocytes Absolute: 0.4 10*3/uL (ref 0.1–0.9)
Monocytes: 7 %
Neutrophils Absolute: 3.2 10*3/uL (ref 1.4–7.0)
Neutrophils: 55 %
Platelets: 187 10*3/uL (ref 150–450)
RBC: 4.36 x10E6/uL (ref 4.14–5.80)
RDW: 11.1 % — ABNORMAL LOW (ref 11.6–15.4)
WBC: 5.8 10*3/uL (ref 3.4–10.8)

## 2022-05-13 LAB — LIPID PANEL
Chol/HDL Ratio: 3 ratio (ref 0.0–5.0)
Cholesterol, Total: 131 mg/dL (ref 100–199)
HDL: 44 mg/dL (ref >39)
LDL Chol Calc (NIH): 65 mg/dL (ref 0–99)
Triglycerides: 126 mg/dL (ref 0–149)
VLDL Cholesterol Cal: 22 mg/dL (ref 5–40)

## 2022-05-13 LAB — COMPREHENSIVE METABOLIC PANEL
ALT: 7 IU/L (ref 0–44)
AST: 15 IU/L (ref 0–40)
Albumin/Globulin Ratio: 2.4 — ABNORMAL HIGH (ref 1.2–2.2)
Albumin: 4.6 g/dL (ref 3.8–4.8)
Alkaline Phosphatase: 98 IU/L (ref 44–121)
BUN/Creatinine Ratio: 29 — ABNORMAL HIGH (ref 10–24)
BUN: 25 mg/dL (ref 8–27)
Bilirubin Total: 0.8 mg/dL (ref 0.0–1.2)
CO2: 24 mmol/L (ref 20–29)
Calcium: 9.5 mg/dL (ref 8.6–10.2)
Chloride: 99 mmol/L (ref 96–106)
Creatinine, Ser: 0.86 mg/dL (ref 0.76–1.27)
Globulin, Total: 1.9 g/dL (ref 1.5–4.5)
Glucose: 157 mg/dL — ABNORMAL HIGH (ref 70–99)
Potassium: 4.6 mmol/L (ref 3.5–5.2)
Sodium: 138 mmol/L (ref 134–144)
Total Protein: 6.5 g/dL (ref 6.0–8.5)
eGFR: 93 mL/min/{1.73_m2} (ref >59)

## 2022-05-13 LAB — HEMOGLOBIN A1C
Est. average glucose Bld gHb Est-mCnc: 163 mg/dL
Hgb A1c MFr Bld: 7.3 % — ABNORMAL HIGH (ref 4.8–5.6)

## 2022-05-13 LAB — CARDIOVASCULAR RISK ASSESSMENT

## 2022-05-13 MED ORDER — OZEMPIC (0.25 OR 0.5 MG/DOSE) 2 MG/3ML ~~LOC~~ SOPN: 0.5000 mg | PEN_INJECTOR | SUBCUTANEOUS | 2 refills | Status: DC

## 2022-05-13 NOTE — Progress Notes (Addendum)
Subjective:  Patient ID: Jeffrey Parsons, male    DOB: [birth year]  Age: 71 y.o. MRN: [id number]  Chief Complaint  Patient presents with   Diabetes   Coronary Artery Disease    HPI: chronic  Diabetes: Patient  is taking Lantus 9 units daily, Jardiance 10 mg daily, Metformin 500 mg  twice a day. He check his blood sugar every day. Last A1C was 7.1%. He had his eye exam done.  He is on diet and exercising.Patient is using Freestyle precision.  No hypoglycemia. He does not need GLP-1 at present.  Hyperlipidemia: Taking Rosuvastatin 10 mg daily.stable start zetia.He is on low fat diet.  CHF: Takes Entresto 24-26 mg twice a day, No DOE, no edema.   Parkisonism: Carbidopa-Levodopa 50- 200 mg daily and 25-250 mg 4 times a day. 2 falls, weak at end of day.Neurologist added amantadine. With little help. Neurologist Discussed brain surgery using ultrasound, one of the new parkinsons techniques.  Patient had is having poor control of his Parkinson needs him and having worsening on and off symptoms.  Current Outpatient Medications on File Prior to Visit  Medication Sig Dispense Refill   aspirin EC 81 MG tablet Take 1 tablet (81 mg total) by mouth daily.     carbidopa-levodopa (SINEMET CR) 50-200 MG tablet Take 1 tablet by mouth every evening.      carbidopa-levodopa (SINEMET IR) 25-250 MG tablet Take 1 tablet by mouth 4 (four) times daily.      Cyanocobalamin 1000 MCG CAPS Take 1 tablet by mouth daily.     ezetimibe (ZETIA) 10 MG tablet Take 1 tablet (10 mg total) by mouth daily. 90 tablet 3   insulin glargine (LANTUS) 100 UNIT/ML injection Inject 9 Units into the skin daily.     JARDIANCE 10 MG TABS tablet TAKE 1 TABLET BY MOUTH DAILY BEFORE BREAKFAST. 90 tablet 0   metFORMIN (GLUCOPHAGE) 500 MG tablet TAKE 2 TABLETS (1,000 MG TOTAL) 2 (TWO) TIMES DAILY WITH A MEAL. START THIS WITH NEW INSULIN 360 tablet 2   metoprolol succinate (TOPROL XL) 25 MG 24 hr tablet Take 1 tablet (25 mg total) by mouth  daily. 90 tablet 3   mirtazapine (REMERON) 15 MG tablet Take 7.5 mg by mouth at bedtime.     Multiple Vitamin (MULTIVITAMIN) tablet Take 1 tablet by mouth daily.     oxybutynin (DITROPAN) 5 MG tablet Take 5 mg by mouth daily.     rosuvastatin (CRESTOR) 10 MG tablet Take 1 tablet by mouth daily before breakfast.     sacubitril-valsartan (ENTRESTO) 24-26 MG Take 1 tablet by mouth 2 (two) times daily. 180 tablet 3   tamsulosin (FLOMAX) 0.4 MG CAPS capsule SMARTSIG:1 Capsule(s) By Mouth Every Evening     No current facility-administered medications on file prior to visit.   Past Medical History:  Diagnosis Date   AICD (automatic cardioverter/defibrillator) present    Allergy to sulfa drugs 12/04/2016   Complete heart block ([place]) 05/26/2017   Coronary artery disease involving native coronary artery of native heart without angina pectoris 09/12/2016   Ischemic cardiomyopathy 05/26/2017   Pneumonia 2019   Retinopathy    Past Surgical History:  Procedure Laterality Date   BI-VENTRICULAR IMPLANTABLE CARDIOVERTER DEFIBRILLATOR UPGRADE  06/25/2018   BIV UPGRADE N/A 06/25/2018   Procedure: BIV ICD UPGRADE;  Surgeon: Deboraha Sprang, MD;  Location: [place] [facility];  Service: Cardiovascular;  Laterality: N/A;   CARDIAC CATHETERIZATION  2001; ~ 2013   CATARACT EXTRACTION W/ INTRAOCULAR  LENS  IMPLANT, BILATERAL Bilateral    CORONARY ARTERY BYPASS GRAFT  2001   "CABG X5"   INSERT / REPLACE / REMOVE PACEMAKER  05/2010   KNEE SURGERY Left    AS A CHILD   R shoulder surgery  05/2020   RIGHT/LEFT HEART CATH AND CORONARY/GRAFT ANGIOGRAPHY N/A 06/16/2017   Procedure: RIGHT/LEFT HEART CATH AND CORONARY/GRAFT ANGIOGRAPHY;  Surgeon: Tonny Bollman, MD;  Location: [facility] IN[facility]AS[facility];  Service: Cardiovascular;  Laterality: N/A;   TONSILLECTOMY      Family History  Problem Relation Age of Onset   Bronchitis Mother    Coronary artery disease Father    Heart disease Father    Hypertension Sister     Hypertension Brother    Social History   Socioeconomic History   Marital status: Married    Spouse name: Not on file   Number of children: Not on file   Years of education: Not on file   Highest education level: Not on file  Occupational History   Not on file  Tobacco Use   Smoking status: Never   Smokeless tobacco: Never  Vaping Use   Vaping Use: Never used  Substance and Sexual Activity   Alcohol use: Never   Drug use: Never   Sexual activity: Yes  Other Topics Concern   Not on file  Social History Narrative   Not on file   Social Determinants of Health   Financial Resource Strain: High Risk (05/07/2022)   Overall Financial Resource Strain (CARDIA)    Difficulty of Paying Living Expenses: Hard  Food Insecurity: No Food Insecurity (01/26/2020)   Hunger Vital Sign    Worried About Running Out of Food in the Last Year: Never true    Ran Out of Food in the Last Year: Never true  Transportation Needs: No Transportation Needs (05/07/2022)   PRAPARE - Administrator, Civil Service (Medical): No    Lack of Transportation (Non-Medical): No  Physical Activity: Sufficiently Active (01/16/2022)   Exercise Vital Sign    Days of Exercise per Week: 5 days    Minutes of Exercise per Session: 50 min  Stress: Not on file  Social Connections: Socially Integrated (05/08/2020)   Social Connection and Isolation Panel [NHANES]    Frequency of Communication with Friends and Family: More than three times a week    Frequency of Social Gatherings with Friends and Family: Twice a week    Attends Religious Services: More than 4 times per year    Active Member of [employer] or Organizations: Yes    Attends Engineer, structural: More than 4 times per year    Marital Status: Married    Review of Systems  Constitutional:  Negative for activity change and appetite change.  HENT: Negative.    Eyes:  Negative for visual disturbance.  Respiratory: Negative.  Negative for chest  tightness and shortness of breath.   Cardiovascular:  Negative for chest pain and palpitations.  Gastrointestinal:  Negative for abdominal distention and abdominal pain.  Genitourinary:  Negative for difficulty urinating.  Musculoskeletal:  Positive for arthralgias.  Skin: Negative.   Neurological:  Positive for tremors and weakness.       Parkinson tremor  Psychiatric/Behavioral: Negative.       Objective:  BP 94/60   Pulse 88   Temp 98.8 F (37.1 C)   Resp 14   Ht 5\' 6"  (1.676 m)   Wt 136 lb (61.7 kg)   SpO2 97%  BMI 21.95 kg/m      05/13/2022    1:32 PM 03/05/2022    3:30 PM 02/18/2022    9:46 AM  BP/Weight  Systolic BP 94 96 92  Diastolic BP 60 48 50  Wt. (Lbs) 136 135.2 130.4  BMI 21.95 kg/m2 21.18 kg/m2 20.42 kg/m2    Physical Exam Vitals reviewed.  Constitutional:      General: He is not in acute distress.    Appearance: Normal appearance.  HENT:     Head: Normocephalic.     Right Ear: Tympanic membrane normal.     Left Ear: Tympanic membrane normal.     Nose: Nose normal.     Mouth/Throat:     Mouth: Mucous membranes are moist.     Pharynx: Oropharynx is clear.  Eyes:     Extraocular Movements: Extraocular movements intact.     Conjunctiva/sclera: Conjunctivae normal.     Pupils: Pupils are equal, round, and reactive to light.  Cardiovascular:     Rate and Rhythm: Normal rate and regular rhythm.     Pulses: Normal pulses.     Heart sounds: Normal heart sounds. No murmur heard.    No gallop.  Pulmonary:     Effort: Pulmonary effort is normal. No respiratory distress.     Breath sounds: Normal breath sounds. No wheezing.  Abdominal:     General: Abdomen is flat. Bowel sounds are normal. There is no distension.     Palpations: Abdomen is soft.     Tenderness: There is no abdominal tenderness.  Musculoskeletal:        General: Normal range of motion.     Cervical back: Normal range of motion.     Right lower leg: No edema.     Left lower leg: No  edema.  Skin:    General: Skin is warm.     Capillary Refill: Capillary refill takes less than 2 seconds.  Neurological:     Mental Status: He is alert and oriented to person, place, and time. Mental status is at baseline.     Motor: Weakness present.     Gait: Gait abnormal.     Comments: Parkinson tremor  Psychiatric:        Mood and Affect: Mood normal.        Thought Content: Thought content normal.     Diabetic Foot Exam - Simple   Simple Foot Form Diabetic Foot exam was performed with the following findings: Yes 05/13/2022  1:58 PM  Visual Inspection No deformities, no ulcerations, no other skin breakdown bilaterally: Yes Sensation Testing See comments: Yes Pulse Check Posterior Tibialis and Dorsalis pulse intact bilaterally: Yes Comments Some sensation feet      Lab Results  Component Value Date   WBC 5.8 05/12/2022   HGB 13.8 05/12/2022   HCT 41.5 05/12/2022   PLT 187 05/12/2022   GLUCOSE 157 (H) 05/12/2022   CHOL 131 05/12/2022   TRIG 126 05/12/2022   HDL 44 05/12/2022   LDLCALC 65 05/12/2022   ALT 7 05/12/2022   AST 15 05/12/2022   NA 138 05/12/2022   K 4.6 05/12/2022   CL 99 05/12/2022   CREATININE 0.86 05/12/2022   BUN 25 05/12/2022   CO2 24 05/12/2022   TSH 2.320 11/23/2020   HGBA1C 7.3 (H) 05/12/2022   MICROALBUR 80 01/17/2020      Assessment & Plan:   Problem List Items Addressed This Visit       Cardiovascular and Mediastinum  Coronary artery disease involving native coronary artery of native heart without angina pectoris An individual plan was formulated has noted on patient history and exam, labs and evidence based data. Patient continue has not had recent angina or nitroglycerin use. continue present treatment.     Essential hypertension An individual hypertension care plan was established and reinforced today.  The patient's status was assessed using clinical findings on exam and labs or diagnostic tests. The patient's success at  meeting treatment goals on disease specific evidence-based guidelines and found to be well controlled. SELF MANAGEMENT: The patient and I together assessed ways to personally work towards obtaining the recommended goals. RECOMMENDATIONS: avoid decongestants found in common cold remedies, decrease consumption of alcohol, perform routine monitoring of BP with home BP cuff, exercise, reduction of dietary salt, take medicines as prescribed, try not to miss doses and quit smoking.  Regular exercise and maintaining a healthy weight is needed.  Stress reduction may help. A CLINICAL SUMMARY including written plan identify barriers to care unique to individual due to social or financial issues.  We attempt to mutually creat solutions for individual and family understanding.     Obesity, diabetes, and hypertension syndrome ([place]) - Primary An individual care plan for diabetes was established and reinforced today.  The patient's status was assessed using clinical findings on exam, labs and diagnostic testing. Patient success at meeting goals based on disease specific evidence-based guidelines and found to be good controlled. Medications were assessed and patient's understanding of the medical issues , including barriers were assessed. Recommend adherence to a diabetic diet, a graduated exercise program, HgbA1c level is checked quarterly, and urine microalbumin performed yearly .  Annual mono-filament sensation testing performed. Lower blood pressure and control hyperlipidemia is important. Get annual eye exams and annual flu shots and smoking cessation discussed.  Self management goals were discussed.      Nervous and Auditory   Parkinson's disease Parkinson disease is fairly well controlled, still some tremor in A  l   Genitourinary   BPH with obstruction/lower urinary tract symptoms Plan of care was formulated today.  he is doing well.  A plan of care was formulated using patient exam, tests and other sources  to optimize care using evidence based information.  Recommend no smoking, no eating after supper, avoid fatty foods, elevate Head of bed, avoid tight fitting clothing.  Continue on tamsulosin.      Other   Mixed hyperlipidemia AN INDIVIDUAL CARE PLAN for hyperlipidemia/ cholesterol was established and reinforced today.  The patient's status was assessed using clinical findings on exam, lab and other diagnostic tests. The patient's disease status was assessed based on evidence-based guidelines and found to be fair controlled. MEDICATIONS were reviewed. SELF MANAGEMENT GOALS have been discussed and patient's success at attaining the goal of low cholesterol was assessed. RECOMMENDATION given include regular exercise 3 days a week and low cholesterol/low fat diet. CLINICAL SUMMARY including written plan to identify barriers unique to the patient due to social or economic  reasons was discussed.     Pacemaker Patient's pacemaker is working well  .   A total of 30 minutes were spent face-to-face with the patient during this encounter and over half of that time was spent on counseling and coordination of care.      Follow-up: Return in about 3 months (around 08/13/2022).  An After Visit Summary was printed and given to the patient.  [name], MD [facility] [phone]

## 2022-05-13 NOTE — Progress Notes (Signed)
GLUCOSE 157kidney tests ok, liver tests normal, A1c 7.3 diabetes, CBC normal, cholesterol LDL high 117- start ozempic .50mg  q week. Can come in for first dose and education on medicine lp

## 2022-05-14 ENCOUNTER — Telehealth: Payer: Self-pay

## 2022-05-14 DIAGNOSIS — R293 Abnormal posture: Secondary | ICD-10-CM | POA: Diagnosis not present

## 2022-05-14 DIAGNOSIS — M25662 Stiffness of left knee, not elsewhere classified: Secondary | ICD-10-CM | POA: Diagnosis not present

## 2022-05-14 DIAGNOSIS — R2681 Unsteadiness on feet: Secondary | ICD-10-CM | POA: Diagnosis not present

## 2022-05-14 DIAGNOSIS — M25661 Stiffness of right knee, not elsewhere classified: Secondary | ICD-10-CM | POA: Diagnosis not present

## 2022-05-14 DIAGNOSIS — G20A1 Parkinson's disease without dyskinesia, without mention of fluctuations: Secondary | ICD-10-CM | POA: Diagnosis not present

## 2022-05-14 NOTE — Telephone Encounter (Signed)
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 Provider portion completed and placed in Dr Glenda Chroman folder to be signed

## 2022-05-15 IMAGING — CR DG CHEST 2V
2 series · 2 of 2 positions shown · non-contrast
Comparison: 06/26/2018

CLINICAL DATA: Low heart rate

EXAM:
CHEST - 2 VIEW

[chest pa]
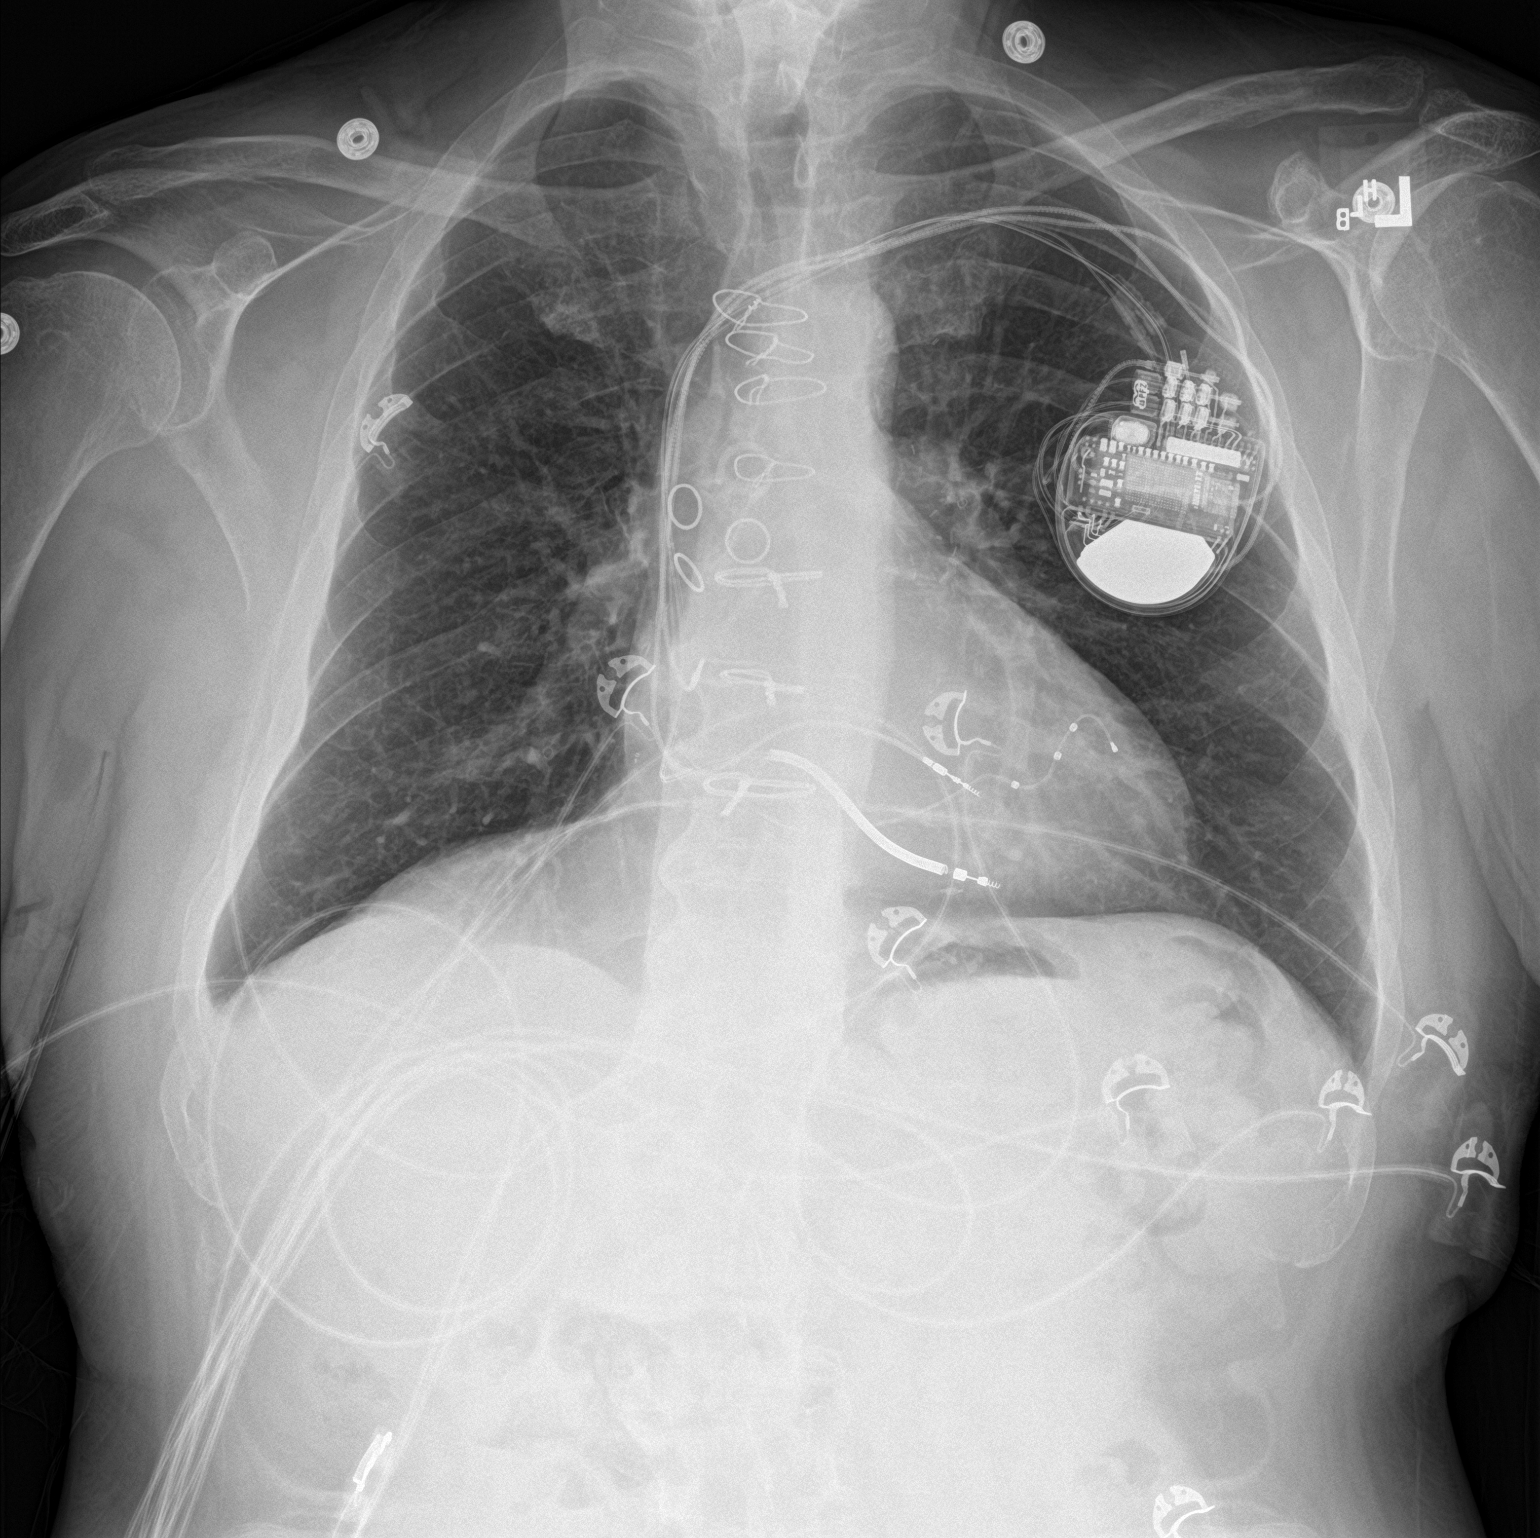

[chest lat]
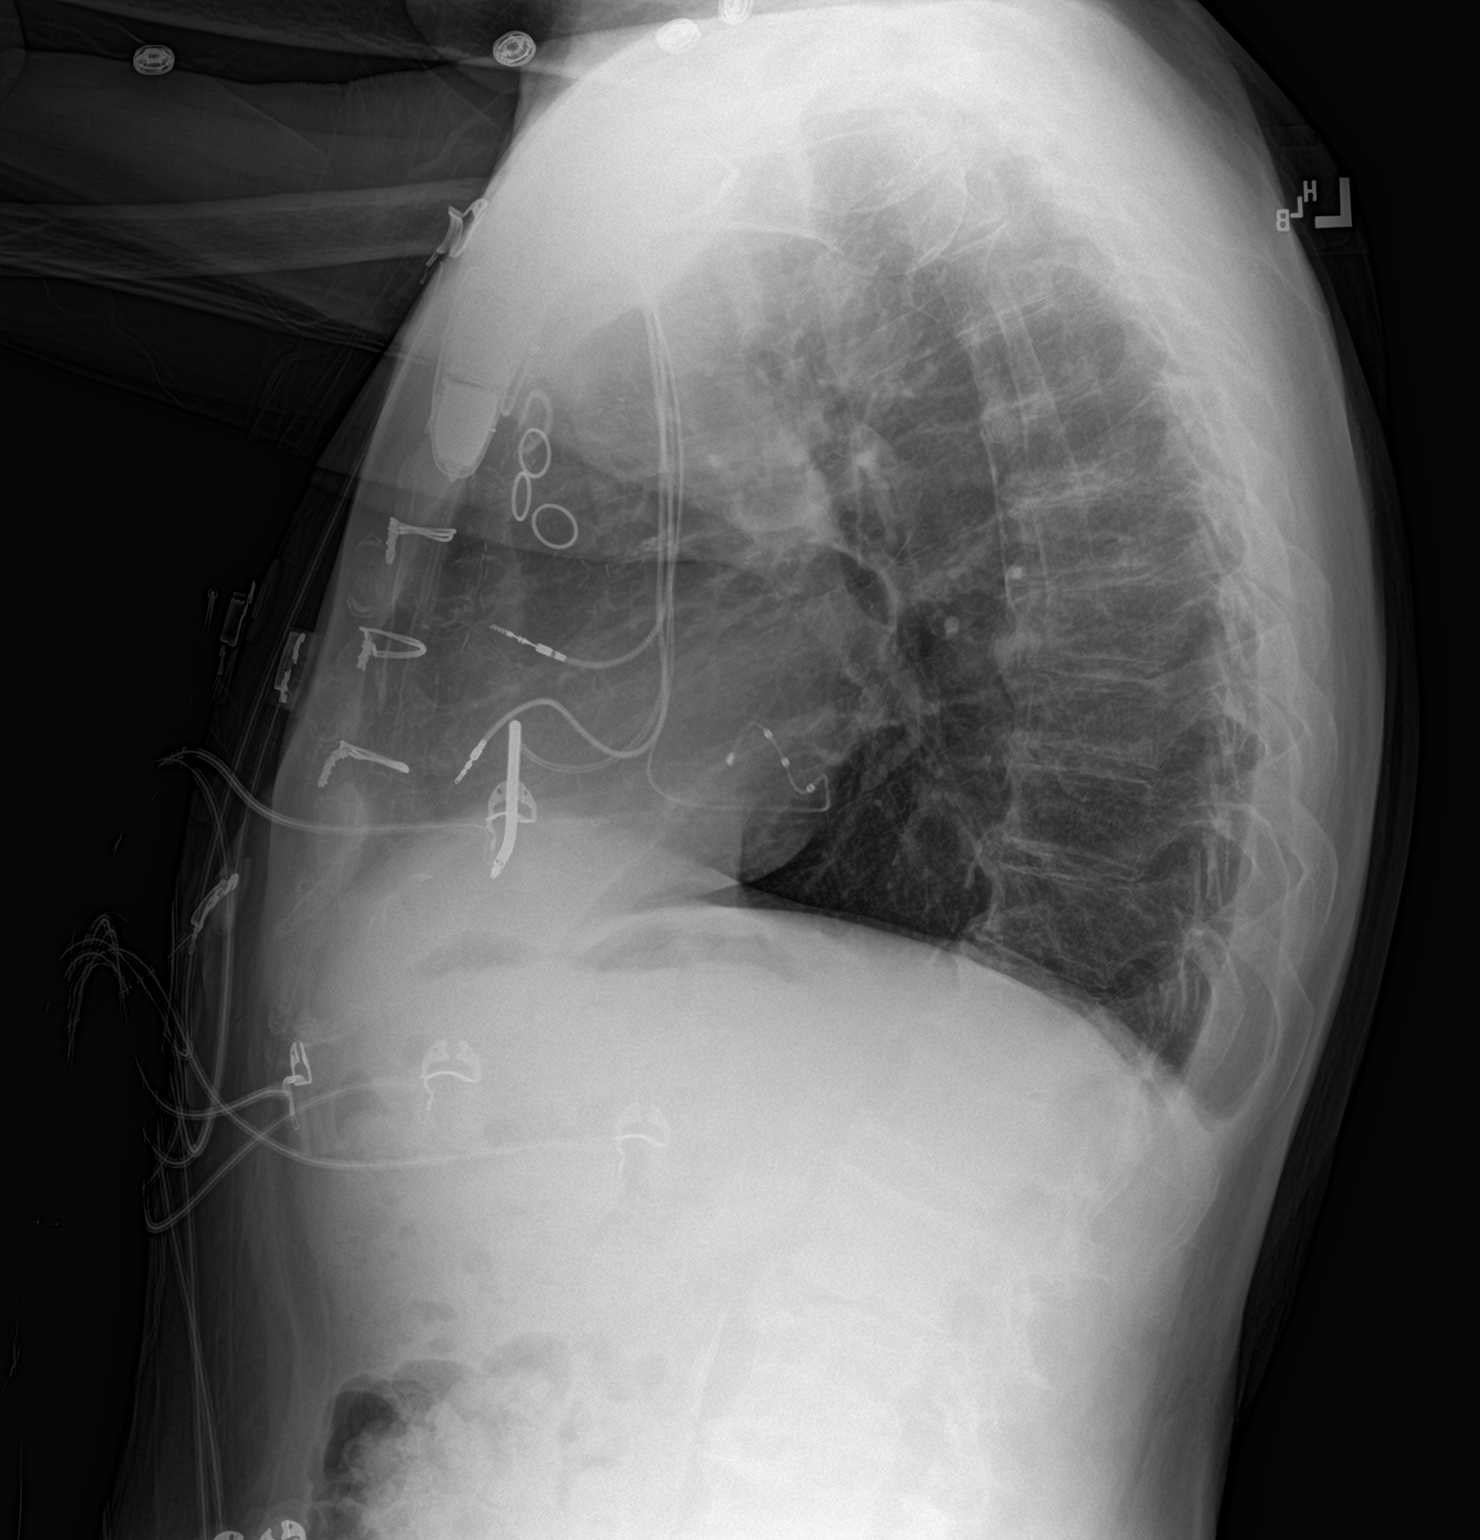

[2 of 2 positions shown; findings below may reference images not displayed]

FINDINGS: Post sternotomy changes. Left-sided multi lead pacing device as
before. No focal opacity. Tiny right pleural effusion. Normal heart
size. No pneumothorax.
IMPRESSION: Tiny right pleural effusion.

## 2022-05-16 ENCOUNTER — Other Ambulatory Visit: Payer: Self-pay | Admitting: Legal Medicine

## 2022-05-16 ENCOUNTER — Telehealth: Payer: Self-pay

## 2022-05-16 NOTE — Chronic Care Management (AMB) (Signed)
Faxed 2024 re-enrollment forms to [facility] patient assistance program for Lantus and to [facility] Cares patient assistance for Jardiance.    Billee Cashing, CMA Clinical Pharmacist Assistant [phone]

## 2022-05-19 DIAGNOSIS — G20A1 Parkinson's disease without dyskinesia, without mention of fluctuations: Secondary | ICD-10-CM | POA: Diagnosis not present

## 2022-05-19 DIAGNOSIS — M25662 Stiffness of left knee, not elsewhere classified: Secondary | ICD-10-CM | POA: Diagnosis not present

## 2022-05-19 DIAGNOSIS — R2681 Unsteadiness on feet: Secondary | ICD-10-CM | POA: Diagnosis not present

## 2022-05-19 DIAGNOSIS — R293 Abnormal posture: Secondary | ICD-10-CM | POA: Diagnosis not present

## 2022-05-19 DIAGNOSIS — M25661 Stiffness of right knee, not elsewhere classified: Secondary | ICD-10-CM | POA: Diagnosis not present

## 2022-05-26 ENCOUNTER — Telehealth: Payer: Self-pay

## 2022-05-26 DIAGNOSIS — G20A1 Parkinson's disease without dyskinesia, without mention of fluctuations: Secondary | ICD-10-CM | POA: Diagnosis not present

## 2022-05-26 DIAGNOSIS — M25662 Stiffness of left knee, not elsewhere classified: Secondary | ICD-10-CM | POA: Diagnosis not present

## 2022-05-26 DIAGNOSIS — M25661 Stiffness of right knee, not elsewhere classified: Secondary | ICD-10-CM | POA: Diagnosis not present

## 2022-05-26 DIAGNOSIS — R293 Abnormal posture: Secondary | ICD-10-CM | POA: Diagnosis not present

## 2022-05-26 DIAGNOSIS — R2681 Unsteadiness on feet: Secondary | ICD-10-CM | POA: Diagnosis not present

## 2022-05-26 NOTE — Chronic Care Management (AMB) (Cosign Needed Addendum)
Patient approved with [employer] Cares patient assistance program for 2024 enrollment. Patient will receive medication from 05/18/2022-06/29/2023.  Wife also called and stated that [employer] is needing additional information for Sherryll Burger but this will come from patient's cardiologist and we did not need to send anything, also they did receive Jardiance from [facility] cares and appreciate all the help.     Billee Cashing, CMA Clinical Pharmacist Assistant [phone]

## 2022-05-28 DIAGNOSIS — M25662 Stiffness of left knee, not elsewhere classified: Secondary | ICD-10-CM | POA: Diagnosis not present

## 2022-05-28 DIAGNOSIS — R293 Abnormal posture: Secondary | ICD-10-CM | POA: Diagnosis not present

## 2022-05-28 DIAGNOSIS — M25661 Stiffness of right knee, not elsewhere classified: Secondary | ICD-10-CM | POA: Diagnosis not present

## 2022-05-28 DIAGNOSIS — R2681 Unsteadiness on feet: Secondary | ICD-10-CM | POA: Diagnosis not present

## 2022-05-28 DIAGNOSIS — G20A1 Parkinson's disease without dyskinesia, without mention of fluctuations: Secondary | ICD-10-CM | POA: Diagnosis not present

## 2022-05-29 DIAGNOSIS — E782 Mixed hyperlipidemia: Secondary | ICD-10-CM

## 2022-05-29 DIAGNOSIS — I10 Essential (primary) hypertension: Secondary | ICD-10-CM

## 2022-06-02 DIAGNOSIS — M25661 Stiffness of right knee, not elsewhere classified: Secondary | ICD-10-CM | POA: Diagnosis not present

## 2022-06-02 DIAGNOSIS — R293 Abnormal posture: Secondary | ICD-10-CM | POA: Diagnosis not present

## 2022-06-02 DIAGNOSIS — M25662 Stiffness of left knee, not elsewhere classified: Secondary | ICD-10-CM | POA: Diagnosis not present

## 2022-06-02 DIAGNOSIS — R2681 Unsteadiness on feet: Secondary | ICD-10-CM | POA: Diagnosis not present

## 2022-06-02 DIAGNOSIS — G20C Parkinsonism, unspecified: Secondary | ICD-10-CM | POA: Diagnosis not present

## 2022-06-02 NOTE — Telephone Encounter (Signed)
Approval letter received from [employer] for [place] assistance through 06/30/2023. Copy of letter in chart media

## 2022-06-11 DIAGNOSIS — R2681 Unsteadiness on feet: Secondary | ICD-10-CM | POA: Diagnosis not present

## 2022-06-11 DIAGNOSIS — M25661 Stiffness of right knee, not elsewhere classified: Secondary | ICD-10-CM | POA: Diagnosis not present

## 2022-06-11 DIAGNOSIS — G20C Parkinsonism, unspecified: Secondary | ICD-10-CM | POA: Diagnosis not present

## 2022-06-11 DIAGNOSIS — M25662 Stiffness of left knee, not elsewhere classified: Secondary | ICD-10-CM | POA: Diagnosis not present

## 2022-06-11 DIAGNOSIS — R293 Abnormal posture: Secondary | ICD-10-CM | POA: Diagnosis not present

## 2022-06-12 ENCOUNTER — Telehealth: Payer: Self-pay

## 2022-06-12 NOTE — Progress Notes (Signed)
Pt was informed he needs to sign his updated [employer] PAP that is in the front office of Dr. Sedalia Muta. Pt will be there tomorrow morning.   Roxana Hires, CMA Clinical Pharmacist Assistant  [phone]

## 2022-06-16 ENCOUNTER — Telehealth: Payer: Self-pay | Admitting: Physician Assistant

## 2022-06-16 NOTE — Chronic Care Management (AMB) (Signed)
Notification received from [facility] patient assistance needing an updated application for 2024 re-enrollment. Refaxed updated application for Lantus.     Billee Cashing, CMA Clinical Pharmacist Assistant [phone]

## 2022-07-01 ENCOUNTER — Encounter: Payer: Self-pay | Admitting: Internal Medicine

## 2022-07-01 ENCOUNTER — Ambulatory Visit: Payer: Medicare Other | Attending: Internal Medicine | Admitting: Internal Medicine

## 2022-07-01 VITALS — BP 108/59 | HR 86 | Ht 66.0 in | Wt 135.2 lb

## 2022-07-01 DIAGNOSIS — Z950 Presence of cardiac pacemaker: Secondary | ICD-10-CM | POA: Insufficient documentation

## 2022-07-01 DIAGNOSIS — I442 Atrioventricular block, complete: Secondary | ICD-10-CM | POA: Diagnosis not present

## 2022-07-01 DIAGNOSIS — I495 Sick sinus syndrome: Secondary | ICD-10-CM | POA: Insufficient documentation

## 2022-07-01 DIAGNOSIS — I255 Ischemic cardiomyopathy: Secondary | ICD-10-CM

## 2022-07-01 LAB — CUP PACEART INCLINIC DEVICE CHECK
Battery Remaining Longevity: 33 mo
Brady Statistic RA Percent Paced: 0.6 %
Brady Statistic RV Percent Paced: 98.3 %
Date Time Interrogation Session: 20240102173915
HighPow Impedance: 456 Ohm
Implantable Lead Connection Status: 753985
Implantable Lead Connection Status: 753985
Implantable Lead Connection Status: 753985
Implantable Lead Implant Date: 20111213
Implantable Lead Implant Date: 20191230
Implantable Lead Implant Date: 20191230
Implantable Lead Location: 753858
Implantable Lead Location: 753859
Implantable Lead Location: 753860
Implantable Lead Model: 5076
Implantable Pulse Generator Implant Date: 20191230
Lead Channel Pacing Threshold Amplitude: 0.5 V
Lead Channel Pacing Threshold Amplitude: 0.75 V
Lead Channel Pacing Threshold Amplitude: 1 V
Lead Channel Pacing Threshold Pulse Width: 0.4 ms
Lead Channel Pacing Threshold Pulse Width: 0.4 ms
Lead Channel Pacing Threshold Pulse Width: 0.4 ms
Lead Channel Sensing Intrinsic Amplitude: 2.4 mV

## 2022-07-01 NOTE — Progress Notes (Signed)
MRSA yes       Patient Care Team: Rochel Brome, MD as PCP - General (Family Medicine) Deboraha Sprang, MD as PCP - Electrophysiology (Cardiology) Richardo Priest, MD as PCP - Cardiology (Cardiology) Lane Hacker, [facility] (Pharmacist)   HPI  Jeffrey Parsons is a 72 y.o. male Seen in follow-up for pacemaker implanted in 2011 for syncope with complete heart block in the setting of ischemic heart disease with prior bypass surgery 2002.  No recurrent syncope.  Underwent CRT Medtronic upgrade 12/19.  Device treatment has been complicated by T wave oversensing and subsequent 2: 1 ventricular pacing--treated with decreasing ventricular sensitivity  Guideline directed therapy has been limited by hypotension--seen by Dr. Bettina Gavia 9/23 with hypotension and orthostatic hypotension ascribed to Parkinson's, alpha blockers and his guideline directed therapy  The patient denies chest pain, shortness of breath, nocturnal dyspnea, orthopnea or peripheral edema.  There have been no palpitation or syncope.  Complains of progressive weakness and tremors from his concerns.  Hypotension has been improved by down titration of his medications per Dr. Bettina Gavia.[name]     DATE TEST EF     11/17 Myoview  19 % No ischemia/prior infarcts  11/17 Echo  25 %    12/18  LHC   LIMA-LAD, SVG-D, Rad-OM patent SVG-PD;SVG-PLA occluded   11/19 Echo 15-25% LAE severe (52/2.5/39) MR mod  12/20 Echo  20%   12/21 Echo  30-35% Inferior WMA  10/23 Echo  20-25%       Date Cr K Hgb  6/18 0.83 4.4   10/19  0.89 4.5   2/20 0.93 4.4   6/20 0.88 4.4   11/23 0.86 4.6 13.8      s.   Records and Results Reviewed  Past Medical History:  Diagnosis Date   AICD (automatic cardioverter/defibrillator) present    Allergy to sulfa drugs 12/04/2016   Complete heart block ([place]) 05/26/2017   Coronary artery disease involving native coronary artery of native heart without angina pectoris 09/12/2016   Ischemic cardiomyopathy 05/26/2017    Pneumonia 2019   Retinopathy     Past Surgical History:  Procedure Laterality Date   BI-VENTRICULAR IMPLANTABLE CARDIOVERTER DEFIBRILLATOR UPGRADE  06/25/2018   BIV UPGRADE N/A 06/25/2018   Procedure: BIV ICD UPGRADE;  Surgeon: Deboraha Sprang, MD;  Location: [place] [facility];  Service: Cardiovascular;  Laterality: N/A;   CARDIAC CATHETERIZATION  2001; ~ 2013   CATARACT EXTRACTION W/ INTRAOCULAR LENS  IMPLANT, BILATERAL Bilateral    CORONARY ARTERY BYPASS GRAFT  2001   "CABG X5"   INSERT / REPLACE / REMOVE PACEMAKER  05/2010   KNEE SURGERY Left    AS A CHILD   R shoulder surgery  05/2020   RIGHT/LEFT HEART CATH AND CORONARY/GRAFT ANGIOGRAPHY N/A 06/16/2017   Procedure: RIGHT/LEFT HEART CATH AND CORONARY/GRAFT ANGIOGRAPHY;  Surgeon: Sherren Mocha, MD;  Location: [place] [facility];  Service: Cardiovascular;  Laterality: N/A;   TONSILLECTOMY      Current Meds  Medication Sig   aspirin EC 81 MG tablet Take 1 tablet (81 mg total) by mouth daily.   carbidopa-levodopa (SINEMET CR) 50-200 MG tablet Take 1 tablet by mouth every evening.    carbidopa-levodopa (SINEMET IR) 25-250 MG tablet Take 1 tablet by mouth 5 (five) times daily.   Cyanocobalamin 1000 MCG CAPS Take 1 tablet by mouth daily.   empagliflozin (JARDIANCE) 10 MG TABS tablet TAKE 1 TABLET BY MOUTH EVERY DAY BEFORE BREAKFAST   ezetimibe (ZETIA) 10 MG  tablet Take 1 tablet (10 mg total) by mouth daily.   insulin glargine (LANTUS) 100 UNIT/ML injection Inject 9 Units into the skin daily.   metFORMIN (GLUCOPHAGE) 500 MG tablet TAKE 2 TABLETS (1,000 MG TOTAL) 2 (TWO) TIMES DAILY WITH A MEAL. START THIS WITH NEW INSULIN   metoprolol succinate (TOPROL XL) 25 MG 24 hr tablet Take 1 tablet (25 mg total) by mouth daily.   mirtazapine (REMERON) 15 MG tablet Take 7.5 mg by mouth at bedtime.   Multiple Vitamin (MULTIVITAMIN) tablet Take 1 tablet by mouth daily.   oxybutynin (DITROPAN) 5 MG tablet Take 5 mg by mouth daily.   rosuvastatin  (CRESTOR) 10 MG tablet Take 1 tablet by mouth daily before breakfast.   sacubitril-valsartan (ENTRESTO) 24-26 MG Take 1 tablet by mouth 2 (two) times daily.   tamsulosin (FLOMAX) 0.4 MG CAPS capsule SMARTSIG:1 Capsule(s) By Mouth Every Evening    Allergies  Allergen Reactions   Farxiga [Dapagliflozin] Other (See Comments)    Patient lost weight.   Amoxicillin Nausea And Vomiting   Onion     Upset stomach   Sulfa Antibiotics Other (See Comments)    FLUSH       Review of Systems negative except from HPI and PMH  Physical Exam BP (!) 117/58   Pulse 86   Ht 5\' 6"  (1.676 m)   Wt 135 lb 3.2 oz (61.3 kg)   SpO2 99%   BMI 21.82 kg/m  Well developed and well nourished in no acute distress HENT normal Neck supple with JVP-flat Clear Device pocket well healed; without hematoma or erythema.  There is no tethering  Regular rate and rhythm, no  gallop No  murmur Abd-soft with active BS No Clubbing cyanosis   edema Skin-warm and dry A & Oriented  Gross tremor  ECG sinus w P-synchronous/ AV  pacing Upright QRS lead 1 and RS lead V1 06/12/41  Device function is normal.Programming changes none  See Paceart for details    Assessment and  Plan Cardiomyopathy question mechanism   Ischemic heart disease with prior bypass surgery   Congestive heart failure-acute chronic-systolic-class IIb-IIIa   Complete heart block   Sinus tach   ICD -CRT Medtronic DOI 12/19    Parkinsons  End-of-life discussion  Orthostatic hypotension    Parkinson's is progressing.  Not orthostatic today.  Blood pressures are reasonable. Device function is normal.  Euvolemic.  Continue low-dose Entresto low-dose metoprolol and aspirin.  Lengthy discussion regarding end-of-life issues, specifically DNR, the distinction between this and advanced directive and healthcare power of attorney, the opportunity of MOST and the need to have conversations to be very much more explicit than they have been here  today as to what their wishes are and specifically, Mr. Westmoreland has chosen DNR status, having reviewed the above, he will continue to think about it and then will let us know as to what he wants to do about his ICD

## 2022-07-01 NOTE — Patient Instructions (Signed)
Medication Instructions:  Your physician recommends that you continue on your current medications as directed. Please refer to the Current Medication list given to you today.  *If you need a refill on your cardiac medications before your next appointment, please call your pharmacy*   Lab Work: None ordered.  If you have labs (blood work) drawn today and your tests are completely normal, you will receive your results only by: MyChart Message (if you have MyChart) OR A paper copy in the mail If you have any lab test that is abnormal or we need to change your treatment, we will call you to review the results.   Testing/Procedures: None ordered.    Follow-Up: At [facility], you and your health needs are our priority.  As part of our continuing mission to provide you with exceptional heart care, we have created designated Provider Care Teams.  These Care Teams include your primary Cardiologist (physician) and Advanced Practice Providers (APPs -  Physician Assistants and Nurse Practitioners) who all work together to provide you with the care you need, when you need it.  We recommend signing up for the patient portal called "MyChart".  Sign up information is provided on this After Visit Summary.  MyChart is used to connect with patients for Virtual Visits (Telemedicine).  Patients are able to view lab/test results, encounter notes, upcoming appointments, etc.  Non-urgent messages can be sent to your provider as well.   To learn more about what you can do with MyChart, go to [email or web address].    Your next appointment:   12 months with Dr Klein  Important Information About Sugar       

## 2022-07-04 ENCOUNTER — Telehealth: Payer: Self-pay

## 2022-07-04 NOTE — Progress Notes (Signed)
Care Management & Coordination Services Pharmacy Team  Reason for Encounter: Diabetes  Contacted patient on 07/04/2022 to discuss diabetes disease state.   Recent office visits:  05/13/22 [name] MD. Seen for routine visit. No med changes.   Recent consult visits:  07/01/22 (Cardiology) Coralyn Pear MD. Seen for Complete Heart Block. No med changes.   Hospital visits:  None  Medications: Outpatient Encounter Medications as of 07/04/2022  Medication Sig   aspirin EC 81 MG tablet Take 1 tablet (81 mg total) by mouth daily.   carbidopa-levodopa (SINEMET CR) 50-200 MG tablet Take 1 tablet by mouth every evening.    carbidopa-levodopa (SINEMET IR) 25-250 MG tablet Take 1 tablet by mouth 5 (five) times daily.   Cyanocobalamin 1000 MCG CAPS Take 1 tablet by mouth daily.   empagliflozin (JARDIANCE) 10 MG TABS tablet TAKE 1 TABLET BY MOUTH EVERY DAY BEFORE BREAKFAST   ezetimibe (ZETIA) 10 MG tablet Take 1 tablet (10 mg total) by mouth daily.   insulin glargine (LANTUS) 100 UNIT/ML injection Inject 9 Units into the skin daily.   metFORMIN (GLUCOPHAGE) 500 MG tablet TAKE 2 TABLETS (1,000 MG TOTAL) 2 (TWO) TIMES DAILY WITH A MEAL. START THIS WITH NEW INSULIN   metoprolol succinate (TOPROL XL) 25 MG 24 hr tablet Take 1 tablet (25 mg total) by mouth daily.   mirtazapine (REMERON) 15 MG tablet Take 7.5 mg by mouth at bedtime.   Multiple Vitamin (MULTIVITAMIN) tablet Take 1 tablet by mouth daily.   oxybutynin (DITROPAN) 5 MG tablet Take 5 mg by mouth daily.   rosuvastatin (CRESTOR) 10 MG tablet Take 1 tablet by mouth daily before breakfast.   sacubitril-valsartan (ENTRESTO) 24-26 MG Take 1 tablet by mouth 2 (two) times daily.   tamsulosin (FLOMAX) 0.4 MG CAPS capsule SMARTSIG:1 Capsule(s) By Mouth Every Evening   No facility-administered encounter medications on file as of 07/04/2022.    Recent Relevant Labs: Lab Results  Component Value Date/Time   HGBA1C 7.3 (H) 05/12/2022 08:35 AM    HGBA1C 7.1 (H) 01/31/2022 08:33 AM   MICROALBUR 80 01/17/2020 08:38 AM    Kidney Function Lab Results  Component Value Date/Time   CREATININE 0.86 05/12/2022 08:35 AM   CREATININE 1.02 01/31/2022 08:33 AM   GFRNONAA 76 05/03/2020 10:04 AM   GFRAA 87 05/03/2020 10:04 AM    Current antihyperglycemic regimen:  Lantus 8-9 SS units AM  Metformin 1000 mg bid  Empagliflozin 10mg  daily  Patient verbally confirms he is taking the above medications as directed. Yes  What diet changes have been made to improve diabetes control?  What recent interventions/DTPs have been made to improve glycemic control:  Pt is taking 8 units of insulin due to lower bs readings   Have there been any recent hospitalizations or ED visits since last visit with PharmD? No  Patient reports hypoglycemic symptoms, including Hungry, shaking   Patient denies hyperglycemic symptoms, including none  How often are you checking your blood sugar? in the morning before eating or drinking  What are your blood sugars ranging?  Fasting: 07/04/21 117, 07/03/21 105, 07/02/21 111, 07/01/21 55, 06/30/21 81, 06/29/22 80, 06/28/22 129  During the week, how often does your blood glucose drop below 70? Twice a week  Are you checking your feet daily/regularly? Yes  Adherence Review: Is the patient currently on a STATIN medication? Yes Is the patient currently on ACE/ARB medication? Yes Does the patient have >5 day gap between last estimated fill dates? No  Star Rating Drugs:  Medication:  Last Fill: Day Supply Metformin   05/01/22-02/01/22 90ds Empagliflozin (PAP) Received a 90ds supply recently   Pt is calling [employer] today and making sure his Lantus is being approved for 2024. Pt has one pen left and will make sure they schedule this shipment for new year.   Care Gaps: Annual wellness visit in last year? No Last eye exam / retinopathy screening: 02/17/22 Last diabetic foot exam:05/13/22   Ovidio Hanger [employer]

## 2022-07-09 ENCOUNTER — Ambulatory Visit (INDEPENDENT_AMBULATORY_CARE_PROVIDER_SITE_OTHER): Payer: Medicare Other

## 2022-07-09 DIAGNOSIS — I442 Atrioventricular block, complete: Secondary | ICD-10-CM | POA: Diagnosis not present

## 2022-07-09 LAB — CUP PACEART REMOTE DEVICE CHECK
Battery Remaining Longevity: 29 mo
Battery Voltage: 2.94 V
Brady Statistic AP VP Percent: 2.39 %
Brady Statistic AP VS Percent: 0.01 %
Brady Statistic AS VP Percent: 96.63 %
Brady Statistic AS VS Percent: 0.97 %
Brady Statistic RA Percent Paced: 2.37 %
Brady Statistic RV Percent Paced: 97.05 %
Date Time Interrogation Session: 20240110095636
HighPow Impedance: 52 Ohm
Implantable Lead Connection Status: 753985
Implantable Lead Connection Status: 753985
Implantable Lead Connection Status: 753985
Implantable Lead Implant Date: 20111213
Implantable Lead Implant Date: 20191230
Implantable Lead Implant Date: 20191230
Implantable Lead Location: 753858
Implantable Lead Location: 753859
Implantable Lead Location: 753860
Implantable Lead Model: 5076
Implantable Pulse Generator Implant Date: 20191230
Lead Channel Impedance Value: 218.5 Ohm
Lead Channel Impedance Value: 223.149
Lead Channel Impedance Value: 247.358
Lead Channel Impedance Value: 247.358
Lead Channel Impedance Value: 253.333
Lead Channel Impedance Value: 380 Ohm
Lead Channel Impedance Value: 399 Ohm
Lead Channel Impedance Value: 437 Ohm
Lead Channel Impedance Value: 437 Ohm
Lead Channel Impedance Value: 437 Ohm
Lead Channel Impedance Value: 456 Ohm
Lead Channel Impedance Value: 570 Ohm
Lead Channel Impedance Value: 722 Ohm
Lead Channel Impedance Value: 722 Ohm
Lead Channel Impedance Value: 760 Ohm
Lead Channel Impedance Value: 893 Ohm
Lead Channel Impedance Value: 893 Ohm
Lead Channel Impedance Value: 931 Ohm
Lead Channel Pacing Threshold Amplitude: 0.625 V
Lead Channel Pacing Threshold Amplitude: 0.625 V
Lead Channel Pacing Threshold Amplitude: 0.75 V
Lead Channel Pacing Threshold Pulse Width: 0.4 ms
Lead Channel Pacing Threshold Pulse Width: 0.4 ms
Lead Channel Pacing Threshold Pulse Width: 0.4 ms
Lead Channel Sensing Intrinsic Amplitude: 12.375 mV
Lead Channel Sensing Intrinsic Amplitude: 12.375 mV
Lead Channel Sensing Intrinsic Amplitude: 2.125 mV
Lead Channel Sensing Intrinsic Amplitude: 2.125 mV
Lead Channel Setting Pacing Amplitude: 1.25 V
Lead Channel Setting Pacing Amplitude: 1.5 V
Lead Channel Setting Pacing Amplitude: 2 V
Lead Channel Setting Pacing Pulse Width: 0.4 ms
Lead Channel Setting Pacing Pulse Width: 0.4 ms
Lead Channel Setting Sensing Sensitivity: 0.45 mV
Zone Setting Status: 755011
Zone Setting Status: 755011

## 2022-07-11 ENCOUNTER — Telehealth: Payer: Self-pay

## 2022-07-17 ENCOUNTER — Telehealth: Payer: Self-pay

## 2022-07-17 NOTE — Telephone Encounter (Signed)
CALLED AND INFORMED PATIENT THAT WE HAVE RECEIVED HIS LANTUS FROM PATIENT ASSISTANCE AND TO PICK UP AT HIS NEXT CHANCE.

## 2022-07-21 NOTE — Telephone Encounter (Signed)
PT WIFE PICKED HIS LANTUS 1.22.24 WIFE SIGNED PAPER AND I HAVE LEFT IT IN THE PHARM BOX

## 2022-07-23 NOTE — Telephone Encounter (Cosign Needed)
07/23/22- Called and lvm to return my call   Elray Mcgregor, [place] Pharmacist Assistant  [phone]

## 2022-07-28 NOTE — Telephone Encounter (Cosign Needed)
Epic went down on me during this call and I am not sure how to go back and edit my note but pt only had 1 low reading and they stated if he has a good supper then his sugars do not drop so they are trying to do better about having him eat decent or have a snack before bedtime.  Elray Mcgregor, [place] Pharmacist Assistant  [phone]

## 2022-07-28 NOTE — Telephone Encounter (Cosign Needed)
Spoke with pt today  Pt is still taking 5-8 units of insulin at night depending on what his sugars are running. Pt stated he only 1 time it dropped to 56 during the night on the 1/26 and wife stated they   Fasting:  07/27/22 186 07/26/22 94 07/25/22 110 07/24/22 131 07/23/22 138 07/22/22 116 07/21/22 212 07/20/22 165 07/19/22 138 07/18/22 153 07/17/22 155 07/16/22 144 07/15/22 79 07/14/22 143 07/13/22 [place], [place] Pharmacist Assistant  [phone]

## 2022-07-30 ENCOUNTER — Other Ambulatory Visit: Payer: Self-pay

## 2022-07-30 DIAGNOSIS — E1169 Type 2 diabetes mellitus with other specified complication: Secondary | ICD-10-CM

## 2022-07-30 MED ORDER — METFORMIN HCL 500 MG PO TABS: ORAL_TABLET | ORAL | 0 refills | Status: DC

## 2022-07-30 NOTE — Progress Notes (Cosign Needed)
As of 07/10/2022 patient has been approved to receive Lantus through [place] patient assistance program. Enrollment ends 06/30/2023.    Pattricia Boss, [place] Pharmacist Assistant [phone]

## 2022-07-31 NOTE — Progress Notes (Signed)
Remote ICD transmission.   

## 2022-08-05 ENCOUNTER — Telehealth: Payer: Self-pay

## 2022-08-05 NOTE — Progress Notes (Signed)
Care Management & Coordination Services Pharmacy Team  Reason for Encounter: Diabetes  Contacted patient to discuss diabetes disease state. Spoke with patient on 08/05/2022   Current antihyperglycemic regimen:  Lantus 5-8 units at night Metformin 1000 mg bid  Empagliflozin 10mg  daily Patient verbally confirms he is taking the above medications as directed. Yes  What diet changes have been made to improve diabetes control?  What recent interventions/DTPs have been made to improve glycemic control:  No changes   Have there been any recent hospitalizations or ED visits since last visit with PharmD? No  Patient denies hypoglycemic symptoms, including None  Patient denies hyperglycemic symptoms, including none  How often are you checking your blood sugar? in the morning before eating or drinking  What are your blood sugars ranging? Pt stated the days it was high, he had a large meal the night before. He ate pizza.  Fasting:  08/05/22 130 08/04/22 175 08/03/22 170  08/02/22 83 08/01/22 138 07/31/22 99 07/30/22 85 07/29/22 98  During the week, how often does your blood glucose drop below 70? Never  Are you checking your feet daily/regularly? Yes  Adherence Review: Is the patient currently on a STATIN medication? Yes Is the patient currently on ACE/ARB medication? Yes Does the patient have >5 day gap between last estimated fill dates? No   Chart Updates:  Recent office visits:  None  Recent consult visits:  None  Hospital visits:  None  Medications: Outpatient Encounter Medications as of 08/05/2022  Medication Sig   aspirin EC 81 MG tablet Take 1 tablet (81 mg total) by mouth daily.   carbidopa-levodopa (SINEMET CR) 50-200 MG tablet Take 1 tablet by mouth every evening.    carbidopa-levodopa (SINEMET IR) 25-250 MG tablet Take 1 tablet by mouth 5 (five) times daily.   Cyanocobalamin 1000 MCG CAPS Take 1 tablet by mouth daily.   empagliflozin (JARDIANCE) 10 MG TABS tablet TAKE 1  TABLET BY MOUTH EVERY DAY BEFORE BREAKFAST   ezetimibe (ZETIA) 10 MG tablet Take 1 tablet (10 mg total) by mouth daily.   insulin glargine (LANTUS) 100 UNIT/ML injection Inject 9 Units into the skin daily.   metFORMIN (GLUCOPHAGE) 500 MG tablet TAKE 2 TABLETS (1,000 MG TOTAL) 2 (TWO) TIMES DAILY WITH A MEAL. START THIS WITH NEW INSULIN   metoprolol succinate (TOPROL XL) 25 MG 24 hr tablet Take 1 tablet (25 mg total) by mouth daily.   mirtazapine (REMERON) 15 MG tablet Take 7.5 mg by mouth at bedtime.   Multiple Vitamin (MULTIVITAMIN) tablet Take 1 tablet by mouth daily.   oxybutynin (DITROPAN) 5 MG tablet Take 5 mg by mouth daily.   rosuvastatin (CRESTOR) 10 MG tablet Take 1 tablet by mouth daily before breakfast.   sacubitril-valsartan (ENTRESTO) 24-26 MG Take 1 tablet by mouth 2 (two) times daily.   tamsulosin (FLOMAX) 0.4 MG CAPS capsule SMARTSIG:1 Capsule(s) By Mouth Every Evening   No facility-administered encounter medications on file as of 08/05/2022.    Recent Relevant Labs: Lab Results  Component Value Date/Time   HGBA1C 7.3 (H) 05/12/2022 08:35 AM   HGBA1C 7.1 (H) 01/31/2022 08:33 AM   MICROALBUR 80 01/17/2020 08:38 AM    Kidney Function Lab Results  Component Value Date/Time   CREATININE 0.86 05/12/2022 08:35 AM   CREATININE 1.02 01/31/2022 08:33 AM   GFRNONAA 76 05/03/2020 10:04 AM   GFRAA 87 05/03/2020 10:04 AM    Star Rating Drugs:  Medication:  Last Fill: Day Supply Metformin  05/01/22-02/01/22 90ds Empagliflozin     Received PAP on January 2024 for 90ds  Care Gaps: Annual wellness visit in last year? No, 01/25/19 Last eye exam / retinopathy screening:02/17/22 Last diabetic foot exam:05/13/22   Elray Mcgregor, [place] Pharmacist Assistant  [phone]

## 2022-08-13 ENCOUNTER — Other Ambulatory Visit: Payer: Self-pay

## 2022-08-13 DIAGNOSIS — E782 Mixed hyperlipidemia: Secondary | ICD-10-CM

## 2022-08-13 DIAGNOSIS — E119 Type 2 diabetes mellitus without complications: Secondary | ICD-10-CM

## 2022-08-13 DIAGNOSIS — I152 Hypertension secondary to endocrine disorders: Secondary | ICD-10-CM

## 2022-08-13 DIAGNOSIS — I10 Essential (primary) hypertension: Secondary | ICD-10-CM

## 2022-08-14 ENCOUNTER — Other Ambulatory Visit: Payer: Medicare Other

## 2022-08-14 DIAGNOSIS — E669 Obesity, unspecified: Secondary | ICD-10-CM | POA: Diagnosis not present

## 2022-08-14 DIAGNOSIS — E782 Mixed hyperlipidemia: Secondary | ICD-10-CM

## 2022-08-14 DIAGNOSIS — E1159 Type 2 diabetes mellitus with other circulatory complications: Secondary | ICD-10-CM | POA: Diagnosis not present

## 2022-08-14 DIAGNOSIS — E1169 Type 2 diabetes mellitus with other specified complication: Secondary | ICD-10-CM | POA: Diagnosis not present

## 2022-08-14 DIAGNOSIS — I152 Hypertension secondary to endocrine disorders: Secondary | ICD-10-CM

## 2022-08-14 DIAGNOSIS — I10 Essential (primary) hypertension: Secondary | ICD-10-CM

## 2022-08-15 LAB — LIPID PANEL
Chol/HDL Ratio: 2.9 ratio (ref 0.0–5.0)
Cholesterol, Total: 149 mg/dL (ref 100–199)
HDL: 52 mg/dL (ref >39)
LDL Chol Calc (NIH): 69 mg/dL (ref 0–99)
Triglycerides: 163 mg/dL — ABNORMAL HIGH (ref 0–149)
VLDL Cholesterol Cal: 28 mg/dL (ref 5–40)

## 2022-08-15 LAB — COMPREHENSIVE METABOLIC PANEL
ALT: 10 IU/L (ref 0–44)
AST: 15 IU/L (ref 0–40)
Albumin/Globulin Ratio: 2.1 (ref 1.2–2.2)
Albumin: 4.7 g/dL (ref 3.8–4.8)
Alkaline Phosphatase: 111 IU/L (ref 44–121)
BUN/Creatinine Ratio: 26 — ABNORMAL HIGH (ref 10–24)
BUN: 22 mg/dL (ref 8–27)
Bilirubin Total: 0.7 mg/dL (ref 0.0–1.2)
CO2: 19 mmol/L — ABNORMAL LOW (ref 20–29)
Calcium: 9.6 mg/dL (ref 8.6–10.2)
Chloride: 98 mmol/L (ref 96–106)
Creatinine, Ser: 0.84 mg/dL (ref 0.76–1.27)
Globulin, Total: 2.2 g/dL (ref 1.5–4.5)
Glucose: 166 mg/dL — ABNORMAL HIGH (ref 70–99)
Potassium: 4.7 mmol/L (ref 3.5–5.2)
Sodium: 139 mmol/L (ref 134–144)
Total Protein: 6.9 g/dL (ref 6.0–8.5)
eGFR: 93 mL/min/{1.73_m2} (ref >59)

## 2022-08-15 LAB — CBC WITH DIFFERENTIAL/PLATELET
Basophils Absolute: 0.1 10*3/uL (ref 0.0–0.2)
Basos: 1 %
EOS (ABSOLUTE): 0.5 10*3/uL — ABNORMAL HIGH (ref 0.0–0.4)
Eos: 8 %
Hematocrit: 43.3 % (ref 37.5–51.0)
Hemoglobin: 14.6 g/dL (ref 13.0–17.7)
Immature Grans (Abs): 0 10*3/uL (ref 0.0–0.1)
Immature Granulocytes: 1 %
Lymphocytes Absolute: 1.8 10*3/uL (ref 0.7–3.1)
Lymphs: 27 %
MCH: 31.7 pg (ref 26.6–33.0)
MCHC: 33.7 g/dL (ref 31.5–35.7)
MCV: 94 fL (ref 79–97)
Monocytes Absolute: 0.4 10*3/uL (ref 0.1–0.9)
Monocytes: 6 %
Neutrophils Absolute: 3.7 10*3/uL (ref 1.4–7.0)
Neutrophils: 57 %
Platelets: 174 10*3/uL (ref 150–450)
RBC: 4.61 x10E6/uL (ref 4.14–5.80)
RDW: 11.1 % — ABNORMAL LOW (ref 11.6–15.4)
WBC: 6.5 10*3/uL (ref 3.4–10.8)

## 2022-08-15 LAB — MICROALBUMIN / CREATININE URINE RATIO
Creatinine, Urine: 57.8 mg/dL
Microalb/Creat Ratio: 12 mg/g creat (ref 0–29)
Microalbumin, Urine: 7 ug/mL

## 2022-08-15 LAB — HEMOGLOBIN A1C
Est. average glucose Bld gHb Est-mCnc: 177 mg/dL
Hgb A1c MFr Bld: 7.8 % — ABNORMAL HIGH (ref 4.8–5.6)

## 2022-08-15 LAB — CARDIOVASCULAR RISK ASSESSMENT

## 2022-08-17 ENCOUNTER — Encounter: Payer: Self-pay | Admitting: Family Medicine

## 2022-08-17 NOTE — Progress Notes (Signed)
Upcoming appointment.   Blood count normal.  Liver function normal.  Kidney function normal.  Cholesterol: Triglycerides slightly elevated. HBA1C: Worsened from 7.3 up to 7.8.  Will discuss at upcoming visit. Not spilling protein in his urine.

## 2022-08-18 ENCOUNTER — Encounter: Payer: Self-pay | Admitting: Family Medicine

## 2022-08-18 ENCOUNTER — Ambulatory Visit (INDEPENDENT_AMBULATORY_CARE_PROVIDER_SITE_OTHER): Payer: Medicare Other | Admitting: Family Medicine

## 2022-08-18 VITALS — BP 118/68 | HR 86 | Temp 97.4°F | Resp 16 | Ht 66.5 in | Wt 135.0 lb

## 2022-08-18 DIAGNOSIS — G20B2 Parkinson's disease with dyskinesia, with fluctuations: Secondary | ICD-10-CM | POA: Diagnosis not present

## 2022-08-18 DIAGNOSIS — E119 Type 2 diabetes mellitus without complications: Secondary | ICD-10-CM

## 2022-08-18 DIAGNOSIS — I2510 Atherosclerotic heart disease of native coronary artery without angina pectoris: Secondary | ICD-10-CM | POA: Diagnosis not present

## 2022-08-18 DIAGNOSIS — I700 Atherosclerosis of aorta: Secondary | ICD-10-CM | POA: Diagnosis not present

## 2022-08-18 DIAGNOSIS — I110 Hypertensive heart disease with heart failure: Secondary | ICD-10-CM

## 2022-08-18 DIAGNOSIS — Z794 Long term (current) use of insulin: Secondary | ICD-10-CM

## 2022-08-18 DIAGNOSIS — Z950 Presence of cardiac pacemaker: Secondary | ICD-10-CM | POA: Diagnosis not present

## 2022-08-18 DIAGNOSIS — E1169 Type 2 diabetes mellitus with other specified complication: Secondary | ICD-10-CM

## 2022-08-18 DIAGNOSIS — I10 Essential (primary) hypertension: Secondary | ICD-10-CM

## 2022-08-18 DIAGNOSIS — E782 Mixed hyperlipidemia: Secondary | ICD-10-CM

## 2022-08-18 DIAGNOSIS — I509 Heart failure, unspecified: Secondary | ICD-10-CM

## 2022-08-18 DIAGNOSIS — E669 Obesity, unspecified: Secondary | ICD-10-CM

## 2022-08-18 MED ORDER — METFORMIN HCL 1000 MG PO TABS: 1000.0000 mg | ORAL_TABLET | Freq: Two times a day (BID) | ORAL | 1 refills | Status: DC

## 2022-08-18 NOTE — Patient Instructions (Signed)
Decease lantus to 6 units daily. Increase metformin to 1000 mg daily. Continue Jardiance 10 mg daily.

## 2022-08-18 NOTE — Assessment & Plan Note (Addendum)
Control: Fair Recommend check sugars fasting daily. Recommend check feet daily. Recommend annual eye exams. Medicines: Increase metformin to 1000 mg bid, decrease Lantus to 6 units dailycontinue Jardiance 10 mg Continue to work on eating a healthy diet and exercise.  Labs drawn today.

## 2022-08-18 NOTE — Assessment & Plan Note (Addendum)
Rosuvastatin 10 mg daily, zetia 10 mg daily, and aspirin 81 mg daily.  Patient has not had recent angina or nitroglycerin use. continue present treatment.

## 2022-08-18 NOTE — Assessment & Plan Note (Addendum)
Well controlled.  No changes to medicines. Continue Rosuvastatin 10 mg daily and zetia 10 mg daily. Continue to work on eating a healthy diet and exercise.  Labs drawn today.

## 2022-08-18 NOTE — Progress Notes (Signed)
Subjective:  Patient ID: Jeffrey Parsons, male    DOB: Jan 05, 1951  Age: 72 y.o. MRN: [id number]  Chief Complaint  Patient presents with   Diabetes   Hyperlipidemia    Diabetes: Patient  is taking Lantus 9 units daily, Jardiance 10 mg daily, Metformin 500 mg  twice a day. He check his blood sugar every day. Ranges from 53-300, Average is 150, Last A1C was 7.8%. He had his eye exam done.  He is on diabetic diet and exercising. Patient is using [employer]. Checks feet daily   Hyperlipidemia: Taking Rosuvastatin 10 mg daily and zetia 10 mg daily .He is on a low fat diet. Trigs 163. LDL 69. HDL 52.     CHF: Takes Entresto 24-26 mg twice a day, Jardiance 10 mg daily. No DOE, no edema. ASA 81 qd. See's Dr. Dulce Sellar and Dr. Graciela Husbands for Pacemaker   Parkisonism: Carbidopa-Levodopa CR 50- 200 mg one before bed and 25-250 mg 5 times a day and mirtazapine 15 mg for sleep. See's Dr. Nadara Mode.     08/18/2022    2:15 PM 06/17/2021    8:59 AM 01/17/2020    8:30 AM  Depression screen PHQ 2/9  Decreased Interest 0 0 0  Down, Depressed, Hopeless 0 0 0  PHQ - 2 Score 0 0 0  Altered sleeping   0  Tired, decreased energy   1  Change in appetite   0  Feeling bad or failure about yourself    0  Trouble concentrating   0  Moving slowly or fidgety/restless   1  Suicidal thoughts   0  PHQ-9 Score   2  Difficult doing work/chores   Not difficult at all         06/25/2018    8:20 PM 06/26/2018    7:30 AM 02/05/2022    3:16 PM 08/18/2022    2:10 PM 08/18/2022    2:15 PM  Fall Risk  Falls in the past year?   1 1 1   Was there an injury with Fall?   1 0 1  Fall Risk Category Calculator   3 1 3   Fall Risk Category (Retired)   High    (RETIRED) Patient Fall Risk Level Moderate fall risk Moderate fall risk     Patient at Risk for Falls Due to   Impaired balance/gait;Impaired mobility;Medication side effect Impaired balance/gait Impaired balance/gait  Fall risk Follow up   Falls evaluation completed Falls  evaluation completed Falls evaluation completed      Review of Systems  Constitutional:  Negative for chills, diaphoresis, fatigue and fever.  HENT:  Negative for congestion, ear pain and sore throat.   Respiratory:  Negative for cough and shortness of breath.   Cardiovascular:  Negative for chest pain and leg swelling.  Gastrointestinal:  Negative for abdominal pain, constipation, diarrhea, nausea and vomiting.  Genitourinary:  Negative for dysuria and urgency.  Musculoskeletal:  Negative for arthralgias and myalgias.  Neurological:  Negative for dizziness and headaches.  Psychiatric/Behavioral:  Negative for dysphoric mood.     Current Outpatient Medications on File Prior to Visit  Medication Sig Dispense Refill   aspirin EC 81 MG tablet Take 1 tablet (81 mg total) by mouth daily.     carbidopa-levodopa (SINEMET CR) 50-200 MG tablet Take 1 tablet by mouth every evening.      carbidopa-levodopa (SINEMET IR) 25-250 MG tablet Take 1 tablet by mouth 5 (five) times daily.     Cyanocobalamin 1000 MCG CAPS  Take 1 tablet by mouth daily.     empagliflozin (JARDIANCE) 10 MG TABS tablet TAKE 1 TABLET BY MOUTH EVERY DAY BEFORE BREAKFAST 90 tablet 2   ezetimibe (ZETIA) 10 MG tablet Take 1 tablet (10 mg total) by mouth daily. 90 tablet 3   insulin glargine (LANTUS) 100 UNIT/ML injection Inject 9 Units into the skin daily.     metoprolol succinate (TOPROL XL) 25 MG 24 hr tablet Take 1 tablet (25 mg total) by mouth daily. 90 tablet 3   mirtazapine (REMERON) 15 MG tablet Take 7.5 mg by mouth at bedtime.     Multiple Vitamin (MULTIVITAMIN) tablet Take 1 tablet by mouth daily.     oxybutynin (DITROPAN) 5 MG tablet Take 5 mg by mouth daily.     rosuvastatin (CRESTOR) 10 MG tablet Take 1 tablet by mouth daily before breakfast.     sacubitril-valsartan (ENTRESTO) 24-26 MG Take 1 tablet by mouth 2 (two) times daily. 180 tablet 3   tamsulosin (FLOMAX) 0.4 MG CAPS capsule SMARTSIG:1 Capsule(s) By Mouth Every  Evening     No current facility-administered medications on file prior to visit.   Past Medical History:  Diagnosis Date   AICD (automatic cardioverter/defibrillator) present    Allergy to sulfa drugs 12/04/2016   Complete heart block (HCC) 05/26/2017   Coronary artery disease involving native coronary artery of native heart without angina pectoris 09/12/2016   Ischemic cardiomyopathy 05/26/2017   Pneumonia 2019   Retinopathy    Past Surgical History:  Procedure Laterality Date   BI-VENTRICULAR IMPLANTABLE CARDIOVERTER DEFIBRILLATOR UPGRADE  06/25/2018   BIV UPGRADE N/A 06/25/2018   Procedure: BIV ICD UPGRADE;  Surgeon: Duke Salvia, MD;  Location: [facility] INVASIVE CV LAB;  Service: Cardiovascular;  Laterality: N/A;   CARDIAC CATHETERIZATION  2001; ~ 2013   CATARACT EXTRACTION W/ INTRAOCULAR LENS  IMPLANT, BILATERAL Bilateral    CORONARY ARTERY BYPASS GRAFT  2001   "CABG X5"   INSERT / REPLACE / REMOVE PACEMAKER  05/2010   KNEE SURGERY Left    AS A CHILD   R shoulder surgery  05/2020   RIGHT/LEFT HEART CATH AND CORONARY/GRAFT ANGIOGRAPHY N/A 06/16/2017   Procedure: RIGHT/LEFT HEART CATH AND CORONARY/GRAFT ANGIOGRAPHY;  Surgeon: Tonny Bollman, MD;  Location: [facility] INVASIVE CV LAB;  Service: Cardiovascular;  Laterality: N/A;   TONSILLECTOMY      Family History  Problem Relation Age of Onset   Bronchitis Mother    Coronary artery disease Father    Heart disease Father    Hypertension Sister    Hypertension Brother    Social History   Socioeconomic History   Marital status: Married    Spouse name: Not on file   Number of children: Not on file   Years of education: Not on file   Highest education level: Not on file  Occupational History   Not on file  Tobacco Use   Smoking status: Never   Smokeless tobacco: Never  Vaping Use   Vaping Use: Never used  Substance and Sexual Activity   Alcohol use: Never   Drug use: Never   Sexual activity: Yes  Other Topics Concern   Not  on file  Social History Narrative   Not on file   Social Determinants of Health   Financial Resource Strain: High Risk (05/07/2022)   Overall Financial Resource Strain (CARDIA)    Difficulty of Paying Living Expenses: Hard  Food Insecurity: No Food Insecurity (01/26/2020)   Hunger Vital Sign  Worried About Programme researcher, broadcasting/film/video in the Last Year: Never true    Ran Out of Food in the Last Year: Never true  Transportation Needs: No Transportation Needs (05/07/2022)   PRAPARE - Administrator, Civil Service (Medical): No    Lack of Transportation (Non-Medical): No  Physical Activity: Sufficiently Active (01/16/2022)   Exercise Vital Sign    Days of Exercise per Week: 5 days    Minutes of Exercise per Session: 50 min  Stress: No Stress Concern Present (08/18/2022)   [employer] of Occupational Health - Occupational Stress Questionnaire    Feeling of Stress : Not at all  Social Connections: Socially Integrated (05/08/2020)   Social Connection and Isolation Panel [NHANES]    Frequency of Communication with Friends and Family: More than three times a week    Frequency of Social Gatherings with Friends and Family: Twice a week    Attends Religious Services: More than 4 times per year    Active Member of [employer] or Organizations: Yes    Attends Engineer, structural: More than 4 times per year    Marital Status: Married    Objective:  BP 118/68   Pulse 86   Temp (!) 97.4 F (36.3 C)   Resp 16   Ht 5' 6.5" (1.689 m)   Wt 135 lb (61.2 kg)   BMI 21.46 kg/m      08/18/2022    2:14 PM 07/01/2022    3:38 PM 07/01/2022    3:01 PM  BP/Weight  Systolic BP 118 108 117  Diastolic BP 68 59 58  Wt. (Lbs) 135  135.2  BMI 21.46 kg/m2  21.82 kg/m2    Physical Exam Vitals reviewed.  Constitutional:      Comments: frail  Neck:     Vascular: No carotid bruit.  Cardiovascular:     Rate and Rhythm: Normal rate and regular rhythm.     Heart sounds: Normal heart sounds.   Pulmonary:     Effort: Pulmonary effort is normal.     Breath sounds: Normal breath sounds. No wheezing, rhonchi or rales.  Abdominal:     General: Bowel sounds are normal.     Palpations: Abdomen is soft.     Tenderness: There is no abdominal tenderness.  Neurological:     Mental Status: He is alert.     Gait: Gait abnormal (shuffling gait with walker.).     Comments: tremor  Psychiatric:        Mood and Affect: Mood normal.        Behavior: Behavior normal.     Diabetic Foot Exam - Simple   Simple Foot Form Diabetic Foot exam was performed with the following findings: Yes 08/18/2022  2:39 PM  Visual Inspection No deformities, no ulcerations, no other skin breakdown bilaterally: Yes See comments: Yes Sensation Testing Intact to touch and monofilament testing bilaterally: Yes Pulse Check Posterior Tibialis and Dorsalis pulse intact bilaterally: Yes Comments 3 claw toes 2nd-3rd-4th and thickened nails      Lab Results  Component Value Date   WBC 6.5 08/14/2022   HGB 14.6 08/14/2022   HCT 43.3 08/14/2022   PLT 174 08/14/2022   GLUCOSE 166 (H) 08/14/2022   CHOL 149 08/14/2022   TRIG 163 (H) 08/14/2022   HDL 52 08/14/2022   LDLCALC 69 08/14/2022   ALT 10 08/14/2022   AST 15 08/14/2022   NA 139 08/14/2022   K 4.7 08/14/2022   CL 98  08/14/2022   CREATININE 0.84 08/14/2022   BUN 22 08/14/2022   CO2 19 (L) 08/14/2022   TSH 2.320 11/23/2020   HGBA1C 7.8 (H) 08/14/2022   MICROALBUR 80 01/17/2020      Assessment & Plan:    Coronary artery disease involving native coronary artery of native heart without angina pectoris Assessment & Plan: Rosuvastatin 10 mg daily, zetia 10 mg daily, and aspirin 81 mg daily.  Patient has not had recent angina or nitroglycerin use. continue present treatment.    Parkinson's disease with dyskinesia and fluctuating manifestations Assessment & Plan: Condition is progressing.  The current medical regimen is effective;  continue  present plan and medications. Management per specialist.     Atherosclerosis of aorta [facility]) Assessment & Plan: The current medical regimen is effective;  continue present plan and medications. Continue Rosuvastatin 10 mg daily and zetia 10 mg daily    Hypertensive heart disease with heart failure (HCC) Assessment & Plan: Well controlled.  On entresto. No medication changes recommended. Continue healthy diet and exercise.     Pacemaker  Combined hyperlipidemia associated with type 2 diabetes mellitus (HCC) Assessment & Plan: Control: not at goal Recommend check sugars fasting daily. Recommend check feet daily. Recommend annual eye exams. Medicines: Decrease lantus to 6 units daily. Increase metformin to 1000 mg daily. Continue Jardiance 10 mg daily. Continue to work on eating a healthy diet and exercise.  Labs reviewed       Meds ordered this encounter  Medications   DISCONTD: metFORMIN (GLUCOPHAGE) 1000 MG tablet    Sig: Take 1 tablet (1,000 mg total) by mouth 2 (two) times daily with a meal.    Dispense:  180 tablet    Refill:  1   DISCONTD: metFORMIN (GLUCOPHAGE) 1000 MG tablet    Sig: Take 1 tablet (1,000 mg total) by mouth 2 (two) times daily with a meal.    Dispense:  180 tablet    Refill:  1    Please disregard previous rx was sent under the incorrect provider.    No orders of the defined types were placed in this encounter.    Follow-up: Return in about 3 months (around 11/16/2022) for Chronic.   I,Katherina A Bramblett,acting as a scribe for Blane Ohara, MD.,have documented all relevant documentation on the behalf of Blane Ohara, MD,as directed by  Blane Ohara, MD while in the presence of Blane Ohara, MD.   Clayborn Bigness I Leal-Borjas,acting as a scribe for Blane Ohara, MD.,have documented all relevant documentation on the behalf of Blane Ohara, MD,as directed by  Blane Ohara, MD while in the presence of Blane Ohara, MD.    An After Visit Summary was  printed and given to the patient.  I attest that I have reviewed this visit and agree with the plan scribed by my staff.   Blane Ohara, MD [facility] [phone]

## 2022-08-18 NOTE — Assessment & Plan Note (Deleted)
Well controlled.  On entresto. No medication changes recommended. Continue healthy diet and exercise.

## 2022-08-20 ENCOUNTER — Telehealth: Payer: Self-pay

## 2022-08-20 ENCOUNTER — Other Ambulatory Visit: Payer: Self-pay

## 2022-08-20 ENCOUNTER — Encounter: Payer: Self-pay | Admitting: Family Medicine

## 2022-08-20 MED ORDER — METFORMIN HCL 1000 MG PO TABS: 1000.0000 mg | ORAL_TABLET | Freq: Two times a day (BID) | ORAL | 1 refills | Status: DC

## 2022-08-20 NOTE — Telephone Encounter (Signed)
Patient called and stated that he was currently taking 2 tablets 500 mg of metformin twice a day, you stated for him to increase his metformin and 1000 mg twice a day for sent in. Please Advise, Patient is very confused with RX.

## 2022-08-22 NOTE — Telephone Encounter (Signed)
Called patient and left detail message.

## 2022-08-23 ENCOUNTER — Encounter: Payer: Self-pay | Admitting: Family Medicine

## 2022-08-23 DIAGNOSIS — E1169 Type 2 diabetes mellitus with other specified complication: Secondary | ICD-10-CM | POA: Insufficient documentation

## 2022-08-23 DIAGNOSIS — I110 Hypertensive heart disease with heart failure: Secondary | ICD-10-CM | POA: Insufficient documentation

## 2022-08-23 DIAGNOSIS — E11319 Type 2 diabetes mellitus with unspecified diabetic retinopathy without macular edema: Secondary | ICD-10-CM | POA: Insufficient documentation

## 2022-08-23 NOTE — Assessment & Plan Note (Signed)
Condition is progressing.  The current medical regimen is effective;  continue present plan and medications. Management per specialist.

## 2022-08-23 NOTE — Assessment & Plan Note (Signed)
Control: not at goal Recommend check sugars fasting daily. Recommend check feet daily. Recommend annual eye exams. Medicines: Decrease lantus to 6 units daily. Increase metformin to 1000 mg daily. Continue Jardiance 10 mg daily. Continue to work on eating a healthy diet and exercise.  Labs reviewed

## 2022-08-23 NOTE — Assessment & Plan Note (Signed)
The current medical regimen is effective;  continue present plan and medications. Continue Rosuvastatin 10 mg daily and zetia 10 mg daily

## 2022-08-23 NOTE — Assessment & Plan Note (Signed)
Well controlled.  On entresto. No medication changes recommended. Continue healthy diet and exercise.

## 2022-09-02 NOTE — Progress Notes (Unsigned)
Cardiology Office Note:    Date:  09/03/2022   ID:  Jeffrey Parsons, DOB [birth year], MRN [id number]  PCP:  Rochel Brome, MD  Cardiologist:  Shirlee More, MD    Referring MD: [name],*    ASSESSMENT:    1. Neurogenic orthostatic hypotension ([place])   2. Ischemic cardiomyopathy   3. Complete heart block ([place])   4. Sick sinus syndrome ([place])   5. Pacemaker   6. Biventricular implantable cardioverter-defibrillator in situ   7. Hypertensive heart disease with heart failure ([place])   8. Mixed hyperlipidemia    PLAN:    In order of problems listed above:  Improved no continue to trend his blood pressure at home and fortunately can tolerate reduced doses of his guideline directed therapy with his severe ischemic cardiomyopathy. Stable pacemaker therapy follow through device clinic Heart failure is well compensated currently not on loop diuretic is on good foundational therapy including his SGLT2 inhibitor beta-blocker and low-dose Entresto Lipids are ideal with his neuromuscular disease I think he is doing well to reduce his statin to 3 days a week he will have no appreciable change in his LDL   Next appointment: 6 months   Medication Adjustments/Labs and Tests Ordered: Current medicines are reviewed at length with the patient today.  Concerns regarding medicines are outlined above.  No orders of the defined types were placed in this encounter.  Meds ordered this encounter  Medications   rosuvastatin (CRESTOR) 10 MG tablet    Sig: Take 1 tablet (10 mg total) by mouth every Monday, Wednesday, and Friday.    Dispense:  35 tablet    Refill:  3    Chief Complaint  Patient presents with   Follow-up    History of Present Illness:    Jeffrey Parsons is a 72 y.o. male with a hx of see date with remote CABG greater than 20 years ago type 2 diabetes mellitus hyperlipidemia sick sinus syndrome with permanent pacemaker CRT/D and Parkinson's disease with severe LV  dysfunction EF reduced in the range of 30 to 35%.  His Parkinson's disease complicated by symptomatic hypotension.  He was last seen 03/05/2022 and required transition from carvedilol to Toprol to avoid orthostatic hypotension reduced dose Entresto and continue therapy intolerant of both MRA and SGLT2 inhibitor..  Compliance with diet, lifestyle and medications: Yes  His wife is present participates in evaluation decision making she is his caregiver. Unfortunately continues to follow due to his Parkinson's.  His blood pressure has been above [id number] systolic and has had no lightheadedness or syncope He is not having edema shortness of breath orthopnea or chest pain. He continues to follow with movement disorder specialist [facility] Recent labs 08/14/2022 cholesterol 149 LDL 69 A1c 7.8% hemoglobin 13.8 creatinine 0.84 potassium 4.7 Past Medical History:  Diagnosis Date   AICD (automatic cardioverter/defibrillator) present    Allergy to sulfa drugs 12/04/2016   Complete heart block ([place]) 05/26/2017   Coronary artery disease involving native coronary artery of native heart without angina pectoris 09/12/2016   Ischemic cardiomyopathy 05/26/2017   Pneumonia 2019   Retinopathy     Past Surgical History:  Procedure Laterality Date   BI-VENTRICULAR IMPLANTABLE CARDIOVERTER DEFIBRILLATOR UPGRADE  06/25/2018   BIV UPGRADE N/A 06/25/2018   Procedure: BIV ICD UPGRADE;  Surgeon: Deboraha Sprang, MD;  Location: [facility];  Service: Cardiovascular;  Laterality: N/A;   CARDIAC CATHETERIZATION  2001; ~ 2013   CATARACT EXTRACTION W/ INTRAOCULAR LENS  IMPLANT,  BILATERAL Bilateral    CORONARY ARTERY BYPASS GRAFT  2001   "CABG X5"   INSERT / REPLACE / REMOVE PACEMAKER  05/2010   KNEE SURGERY Left    AS A CHILD   R shoulder surgery  05/2020   RIGHT/LEFT HEART CATH AND CORONARY/GRAFT ANGIOGRAPHY N/A 06/16/2017   Procedure: RIGHT/LEFT HEART CATH AND CORONARY/GRAFT ANGIOGRAPHY;  Surgeon: Sherren Mocha, MD;  Location: [facility];  Service: Cardiovascular;  Laterality: N/A;   TONSILLECTOMY      Current Medications: Current Meds  Medication Sig   aspirin EC 81 MG tablet Take 1 tablet (81 mg total) by mouth daily.   carbidopa-levodopa (SINEMET CR) 50-200 MG tablet Take 1 tablet by mouth every evening.    carbidopa-levodopa (SINEMET IR) 25-250 MG tablet Take 1 tablet by mouth 5 (five) times daily.   cephALEXin (KEFLEX) 500 MG capsule Take 1 capsule (500 mg total) by mouth 4 (four) times daily for 5 days.   Cyanocobalamin 1000 MCG CAPS Take 1 tablet by mouth daily.   empagliflozin (JARDIANCE) 10 MG TABS tablet TAKE 1 TABLET BY MOUTH EVERY DAY BEFORE BREAKFAST   ezetimibe (ZETIA) 10 MG tablet Take 1 tablet (10 mg total) by mouth daily.   insulin glargine (LANTUS) 100 UNIT/ML injection Inject 9 Units into the skin daily.   metFORMIN (GLUCOPHAGE) 1000 MG tablet Take 1 tablet (1,000 mg total) by mouth 2 (two) times daily with a meal.   metoprolol succinate (TOPROL XL) 25 MG 24 hr tablet Take 1 tablet (25 mg total) by mouth daily.   mirtazapine (REMERON) 15 MG tablet Take 7.5 mg by mouth at bedtime.   Multiple Vitamin (MULTIVITAMIN) tablet Take 1 tablet by mouth daily.   oxybutynin (DITROPAN) 5 MG tablet Take 5 mg by mouth daily.   rosuvastatin (CRESTOR) 10 MG tablet Take 1 tablet (10 mg total) by mouth every Monday, Wednesday, and Friday.   sacubitril-valsartan (ENTRESTO) 24-26 MG Take 1 tablet by mouth 2 (two) times daily.   tamsulosin (FLOMAX) 0.4 MG CAPS capsule SMARTSIG:1 Capsule(s) By Mouth Every Evening   [DISCONTINUED] rosuvastatin (CRESTOR) 10 MG tablet Take 1 tablet by mouth daily before breakfast.     Allergies:   Farxiga [dapagliflozin], Amoxicillin, Onion, and Sulfa antibiotics   Social History   Socioeconomic History   Marital status: Married    Spouse name: Not on file   Number of children: Not on file   Years of education: Not on file   Highest education  level: Not on file  Occupational History   Not on file  Tobacco Use   Smoking status: Never   Smokeless tobacco: Never  Vaping Use   Vaping Use: Never used  Substance and Sexual Activity   Alcohol use: Never   Drug use: Never   Sexual activity: Yes  Other Topics Concern   Not on file  Social History Narrative   Not on file   Social Determinants of Health   Financial Resource Strain: High Risk (05/07/2022)   Overall Financial Resource Strain (CARDIA)    Difficulty of Paying Living Expenses: Hard  Food Insecurity: No Food Insecurity (01/26/2020)   Hunger Vital Sign    Worried About Running Out of Food in the Last Year: Never true    Ran Out of Food in the Last Year: Never true  Transportation Needs: No Transportation Needs (05/07/2022)   PRAPARE - Hydrologist (Medical): No    Lack of Transportation (Non-Medical): No  Physical  Activity: Sufficiently Active (01/16/2022)   Exercise Vital Sign    Days of Exercise per Week: 5 days    Minutes of Exercise per Session: 50 min  Stress: No Stress Concern Present (08/18/2022)   [place]    Feeling of Stress : Not at all  Social Connections: [place] (05/08/2020)   Social Connection and Isolation Panel [NHANES]    Frequency of Communication with Friends and Family: More than three times a week    Frequency of Social Gatherings with Friends and Family: Twice a week    Attends Religious Services: More than 4 times per year    Active Member of [employer] or Organizations: Yes    Attends Music therapist: More than 4 times per year    Marital Status: Married     Family History: The patient's family history includes Bronchitis in his mother; Coronary artery disease in his father; Heart disease in his father; Hypertension in his brother and sister. ROS:   Please see the history of present illness.    All other systems reviewed  and are negative.  EKGs/Labs/Other Studies Reviewed:    The following studies were reviewed today:  Cardiac Studies & Procedures   CARDIAC CATHETERIZATION  CARDIAC CATHETERIZATION 06/16/2017  Narrative  Prox RCA to Mid RCA lesion is 100% stenosed.  Ost Cx to Mid Cx lesion is 100% stenosed.  Ost LM to Dist LM lesion is 75% stenosed.  Ost 1st Diag to 1st Diag lesion is 90% stenosed.  Prox LAD-1 lesion is 80% stenosed.  Prox LAD-2 lesion is 100% stenosed.  LIMA graft was visualized by angiography and is normal in caliber.  The graft exhibits no disease.  Left radial artery graft was visualized by angiography and is normal in caliber.  The graft exhibits no disease.  SVG graft was visualized by angiography.  The graft exhibits mild diffuse disease.  Prox Graft lesion is 100% stenosed.  SVG.  SVG.  Origin to Prox Graft lesion is 100% stenosed.  1. Severe native 3 vessel CAD with total occlusion of the RCA, LCx, and LAD 2. S/P CABG with continued patency of the LIMA-LAD, SVG-diagonal, and free radial-OM grafts 3. Chronic occlusion of the SVG-PDA and SVG-PLA, with the distal RCA receiving late antegrade filling and faint collaterals from the left coronary 4. Elevated LVEDP (21 mmHg), preserved cardiac output, and normal R heart pressures  Ongoing medical therapy is recommended.  Findings Coronary Findings Diagnostic  Dominance: Right  Left Main Ost LM to Dist LM lesion is 75% stenosed.  Left Anterior Descending Prox LAD-1 lesion is 80% stenosed. The lesion is calcified. Prox LAD-2 lesion is 100% stenosed.  First Diagonal Branch Ost 1st Diag to 1st Diag lesion is 90% stenosed.  Left Circumflex Ost Cx to Mid Cx lesion is 100% stenosed.  Right Coronary Artery There is some antegrade filling of the RCA, no well-formed collaterals but possibly micro-channels or recanalization of the vessel. The PDA and PLA branches fill late. There are also left-to-right  collaterals Prox RCA to Mid RCA lesion is 100% stenosed.  Right Posterior Descending Artery Collaterals RPDA filled by collaterals from Prox RCA.  First Right Posterolateral Branch Collaterals 1st RPL filled by collaterals from 1st Sept.  LIMA LIMA Graft To Mid LAD LIMA graft was visualized by angiography and is normal in caliber. The graft exhibits no disease. widely patent LIMA-LAD graft  Left Radial Artery Graft To 2nd Mrg Left radial artery graft  was visualized by angiography and is normal in caliber. The graft exhibits no disease. widely patent left radial to OM2 graft  Saphenous Graft To 1st Diag SVG graft was visualized by angiography. The graft exhibits mild diffuse disease. patent SVG-diagonal with mild diffuse irregularity  Saphenous Graft To 1st RPL SVG. total occlusion of the SVG-PLA Prox Graft lesion is 100% stenosed.  Saphenous Graft To RPDA SVG. total occlusion of the SVG-PDA Origin to Prox Graft lesion is 100% stenosed.  Intervention  No interventions have been documented.     ECHOCARDIOGRAM  ECHOCARDIOGRAM COMPLETE 04/17/2022  Narrative ECHOCARDIOGRAM REPORT    Patient Name:   Jeffrey Parsons Date of Exam: 04/17/2022 Medical Rec #:  [id number]        Height:       67.0 in Accession #:    [id number]       Weight:       135.2 lb Date of Birth:  February 05, 1951        BSA:          1.712 m Patient Age:    89 years         BP:           96/48 mmHg Patient Gender: M                HR:           81 bpm. Exam Location:  [place]  Procedure: 2D Echo, Cardiac Doppler and Color Doppler  Indications:    Ischemic cardiomyopathy [I25.5 (ICD-10-CM)]  History:        Patient has prior history of Echocardiogram examinations, most recent 06/23/2020. CHF, CAD; Risk Factors:Dyslipidemia and Hypertension.  Sonographer:    Philipp Deputy RDCS Referring Phys: [place]   Sonographer Comments: Suboptimal subcostal window. IMPRESSIONS   1. Left  ventricular ejection fraction, by estimation, is 20 to 25%. The left ventricle has severely decreased function. The left ventricle demonstrates regional wall motion abnormalities (see scoring diagram/findings for description). The left ventricular internal cavity size was mildly dilated. Left ventricular diastolic parameters are indeterminate. There is severe hypokinesis of the left ventricular, entire inferior wall, inferoseptal wall, anteroseptal wall and septal wall. 2. Right ventricular systolic function is normal. The right ventricular size is normal. There is normal pulmonary artery systolic pressure. 3. The mitral valve is normal in structure. Moderate mitral valve regurgitation. No evidence of mitral stenosis. 4. The aortic valve is normal in structure. Aortic valve regurgitation is not visualized. No aortic stenosis is present. 5. The inferior vena cava is normal in size with greater than 50% respiratory variability, suggesting right atrial pressure of 3 mmHg.  FINDINGS Left Ventricle: Left ventricular ejection fraction, by estimation, is 20 to 25%. The left ventricle has severely decreased function. The left ventricle demonstrates regional wall motion abnormalities. Severe hypokinesis of the left ventricular, entire inferior wall, inferoseptal wall, anteroseptal wall and septal wall. The left ventricular internal cavity size was mildly dilated. There is no left ventricular hypertrophy. Left ventricular diastolic parameters are indeterminate.   LV Wall Scoring: The entire septum, entire inferior wall, posterior wall, and apex are akinetic.  Right Ventricle: The right ventricular size is normal. No increase in right ventricular wall thickness. Right ventricular systolic function is normal. There is normal pulmonary artery systolic pressure. The tricuspid regurgitant velocity is 2.73 m/s, and with an assumed right atrial pressure of 3 mmHg, the estimated right ventricular systolic pressure is  0000000 mmHg.  Left Atrium: Left atrial size  was normal in size.  Right Atrium: Right atrial size was normal in size.  Pericardium: There is no evidence of pericardial effusion.  Mitral Valve: The mitral valve is normal in structure. Moderate mitral valve regurgitation. No evidence of mitral valve stenosis.  Tricuspid Valve: The tricuspid valve is normal in structure. Tricuspid valve regurgitation is mild . No evidence of tricuspid stenosis.  Aortic Valve: The aortic valve is normal in structure. Aortic valve regurgitation is not visualized. No aortic stenosis is present.  Pulmonic Valve: The pulmonic valve was normal in structure. Pulmonic valve regurgitation is not visualized. No evidence of pulmonic stenosis.  Aorta: The aortic root is normal in size and structure.  Venous: The inferior vena cava is normal in size with greater than 50% respiratory variability, suggesting right atrial pressure of 3 mmHg.  IAS/Shunts: No atrial level shunt detected by color flow Doppler.  Additional Comments: A device lead is visualized in the right ventricle.   LEFT VENTRICLE PLAX 2D LVIDd:         6.10 cm      Diastology LVIDs:         5.40 cm      LV e' medial:    3.70 cm/s LV PW:         0.90 cm      LV E/e' medial:  32.4 LV IVS:        1.00 cm      LV e' lateral:   9.14 cm/s LVOT diam:     2.10 cm      LV E/e' lateral: 13.1 LV SV:         41 LV SV Index:   24 LVOT Area:     3.46 cm  LV Volumes (MOD) LV vol d, MOD A2C: 230.0 ml LV vol d, MOD A4C: 220.0 ml LV vol s, MOD A2C: 149.0 ml LV vol s, MOD A4C: 174.0 ml LV SV MOD A2C:     81.0 ml LV SV MOD A4C:     220.0 ml LV SV MOD BP:      70.1 ml  RIGHT VENTRICLE            IVC RV S prime:     8.16 cm/s  IVC diam: 2.10 cm TAPSE (M-mode): 1.2 cm  LEFT ATRIUM         Index LA diam:    3.60 cm 2.10 cm/m AORTIC VALVE LVOT Vmax:   51.50 cm/s LVOT Vmean:  33.900 cm/s LVOT VTI:    0.119 m  AORTA Ao Root diam: 3.70 cm Ao Asc diam:   3.10 cm  MITRAL VALVE                TRICUSPID VALVE MV Area (PHT): 5.66 cm     TR Peak grad:   29.8 mmHg MV Decel Time: 134 msec     TR Vmax:        273.00 cm/s MR Peak grad: 73.3 mmHg MR Vmax:      428.00 cm/s   SHUNTS MV E velocity: 120.00 cm/s  Systemic VTI:  0.12 m Systemic Diam: 2.10 cm  Jenne Campus MD Electronically signed by Jenne Campus MD Signature Date/Time: 04/17/2022/3:48:57 PM    Final             Recent Labs: 08/14/2022: ALT 10; BUN 22; Creatinine, Ser 0.84; Hemoglobin 14.6; Platelets 174; Potassium 4.7; Sodium 139  Recent Lipid Panel    Component Value Date/Time   CHOL 149 08/14/2022 0814  TRIG 163 (H) 08/14/2022 0814   HDL 52 08/14/2022 0814   CHOLHDL 2.9 08/14/2022 0814   LDLCALC 69 08/14/2022 0814    Physical Exam:    VS:  BP (!) 100/58 (BP Location: Right Arm, Patient Position: Sitting, Cuff Size: Normal)   Pulse 76   Ht 5' 6.5" (1.689 m)   Wt 134 lb (60.8 kg)   SpO2 98%   BMI 21.30 kg/m     Wt Readings from Last 3 Encounters:  09/03/22 134 lb (60.8 kg)  08/18/22 135 lb (61.2 kg)  07/01/22 135 lb 3.2 oz (61.3 kg)     GEN:  Well nourished, well developed in no acute distress characteristic tremor and rigidity Parkinson's disease HEENT: Normal NECK: No JVD; No carotid bruits LYMPHATICS: No lymphadenopathy CARDIAC: RRR, no murmurs, rubs, gallops RESPIRATORY:  Clear to auscultation without rales, wheezing or rhonchi  ABDOMEN: Soft, non-tender, non-distended MUSCULOSKELETAL:  No edema; No deformity  SKIN: Warm and dry NEUROLOGIC:  Alert and oriented x 3 PSYCHIATRIC:  Normal affect    Signed, Shirlee More, MD  09/03/2022 4:01 PM    [facility]

## 2022-09-03 ENCOUNTER — Encounter: Payer: Self-pay | Admitting: Cardiology

## 2022-09-03 ENCOUNTER — Telehealth: Payer: Self-pay

## 2022-09-03 ENCOUNTER — Encounter: Payer: Self-pay | Admitting: Podiatry

## 2022-09-03 ENCOUNTER — Ambulatory Visit (INDEPENDENT_AMBULATORY_CARE_PROVIDER_SITE_OTHER): Payer: Medicare Other | Admitting: Podiatry

## 2022-09-03 ENCOUNTER — Ambulatory Visit: Payer: Medicare Other | Attending: Cardiology | Admitting: Cardiology

## 2022-09-03 VITALS — BP 100/58 | HR 76 | Ht 66.5 in | Wt 134.0 lb

## 2022-09-03 DIAGNOSIS — B351 Tinea unguium: Secondary | ICD-10-CM | POA: Diagnosis not present

## 2022-09-03 DIAGNOSIS — Z950 Presence of cardiac pacemaker: Secondary | ICD-10-CM | POA: Diagnosis not present

## 2022-09-03 DIAGNOSIS — M79674 Pain in right toe(s): Secondary | ICD-10-CM | POA: Diagnosis not present

## 2022-09-03 DIAGNOSIS — I110 Hypertensive heart disease with heart failure: Secondary | ICD-10-CM | POA: Insufficient documentation

## 2022-09-03 DIAGNOSIS — Z95810 Presence of automatic (implantable) cardiac defibrillator: Secondary | ICD-10-CM

## 2022-09-03 DIAGNOSIS — E782 Mixed hyperlipidemia: Secondary | ICD-10-CM

## 2022-09-03 DIAGNOSIS — G903 Multi-system degeneration of the autonomic nervous system: Secondary | ICD-10-CM | POA: Diagnosis not present

## 2022-09-03 DIAGNOSIS — I442 Atrioventricular block, complete: Secondary | ICD-10-CM

## 2022-09-03 DIAGNOSIS — G20B2 Parkinson's disease with dyskinesia, with fluctuations: Secondary | ICD-10-CM

## 2022-09-03 DIAGNOSIS — L97321 Non-pressure chronic ulcer of left ankle limited to breakdown of skin: Secondary | ICD-10-CM | POA: Diagnosis not present

## 2022-09-03 DIAGNOSIS — I255 Ischemic cardiomyopathy: Secondary | ICD-10-CM | POA: Diagnosis not present

## 2022-09-03 DIAGNOSIS — E1169 Type 2 diabetes mellitus with other specified complication: Secondary | ICD-10-CM | POA: Diagnosis not present

## 2022-09-03 DIAGNOSIS — I495 Sick sinus syndrome: Secondary | ICD-10-CM | POA: Insufficient documentation

## 2022-09-03 DIAGNOSIS — L97311 Non-pressure chronic ulcer of right ankle limited to breakdown of skin: Secondary | ICD-10-CM | POA: Diagnosis not present

## 2022-09-03 DIAGNOSIS — M79675 Pain in left toe(s): Secondary | ICD-10-CM

## 2022-09-03 MED ORDER — ROSUVASTATIN CALCIUM 10 MG PO TABS: 10.0000 mg | ORAL_TABLET | ORAL | 3 refills | Status: DC

## 2022-09-03 MED ORDER — CEPHALEXIN 500 MG PO CAPS: 500.0000 mg | ORAL_CAPSULE | Freq: Four times a day (QID) | ORAL | 0 refills | Status: AC

## 2022-09-03 NOTE — Progress Notes (Signed)
Subjective:  Patient ID: Jeffrey Parsons, male    DOB: 12/26/1950,   MRN: [id number]  Chief Complaint  Patient presents with   Foot Problem    Bilateral ankle sores. Patient believes the friction from bed sheets and shoes are causing the sores. Patient is diabetic. He does not know his latest A1C but he does know it was elevated.    Nail Problem    Nail trim     72 y.o. male presents for concern of thickened elongated and painful nails that are difficult to trim. Requesting to have them trimmed today. Relates burning and tingling in their feet. Patient is diabetic and last A1c was  Lab Results  Component Value Date   HGBA1C 7.8 (H) 08/14/2022   . Also relates sores on both of his ankles they have noticed recently pop up. Relates they are from his shoes and get pain when in bed on them. Relates they are sore but denies any treatments.   PCP:  Rochel Brome, MD    . Denies any other pedal complaints. Denies n/v/f/c.   Past Medical History:  Diagnosis Date   AICD (automatic cardioverter/defibrillator) present    Allergy to sulfa drugs 12/04/2016   Complete heart block ([place]) 05/26/2017   Coronary artery disease involving native coronary artery of native heart without angina pectoris 09/12/2016   Ischemic cardiomyopathy 05/26/2017   Pneumonia 2019   Retinopathy     Objective:  Physical Exam: Vascular: DP/PT pulses 2/4 bilateral. CFT <3 seconds. Normal hair growth on digits. No edema.  Skin. No lacerations or abrasions bilateral feet. Bilateral ankle ulcerations over lateral malleoli. About 1 cm x 1 cm x 0.1 cm each with granular base and mild surrounding erythema. No edema purulence or probe to bone.  Musculoskeletal: MMT 5/5 bilateral lower extremities in DF, PF, Inversion and Eversion. Deceased ROM in DF of ankle joint.  Neurological: Sensation intact to light touch.   Assessment:   1. Ulcer of right ankle, limited to breakdown of skin ([place])   2. Combined hyperlipidemia associated  with type 2 diabetes mellitus ([place])   3. Parkinson's disease with dyskinesia and fluctuating manifestations   4. Ulcer of left ankle, limited to breakdown of skin ([facility])   5. Pain due to onychomycosis of toenails of both feet      Plan:  Patient was evaluated and treated and all questions answered. Ulcer bilateral ankles limited to breakdown of skin -Debridement as below. -Dressed with betadine, DSD. -Off-loading with heel cushions that were dispensed  -Keflex sent in as precaution.  -Discussed glucose control and proper protein-rich diet.  -Discussed if any worsening redness, pain, fever or chills to call or may need to report to the emergency room. Patient expressed understanding.   -Discussed and educated patient on diabetic foot care, especially with  regards to the vascular, neurological and musculoskeletal systems.  -Stressed the importance of good glycemic control and the detriment of not  controlling glucose levels in relation to the foot. -Discussed supportive shoes at all times and checking feet regularly.  -Mechanically debrided all nails 1-5 bilateral using sterile nail nipper and filed with dremel without incident     Procedure: Excisional Debridement of Wound Rationale: Removal of non-viable soft tissue from the wound to promote healing.  Anesthesia: none Pre-Debridement Wound Measurements: Ovelrying callus bilateral Post-Debridement Wound Measurements: 1 cm x 1 cm x 0.1 cm right and left ankles.   Type of Debridement: Sharp Excisional Tissue Removed: Non-viable soft tissue Depth of Debridement: subcutaneous  tissue. Technique: Sharp excisional debridement to bleeding, viable wound base.  Dressing: Dry, sterile, compression dressing. Disposition: Patient tolerated procedure well. Patient to return in 2 week for follow-up.  Return in about 2 weeks (around 09/17/2022) for wound check.   Lorenda Peck, DPM

## 2022-09-03 NOTE — Progress Notes (Signed)
Care Management & Coordination Services Pharmacy Team  Reason for Encounter: Diabetes  Contacted patient to discuss diabetes disease state. Spoke with patient on 09/05/2022   Current antihyperglycemic regimen:  Lantus 7-9 units at night Metformin 1000 mg bid  Empagliflozin 10mg  daily (Pt has not started the 25mg , pt was not aware of needing to changes this. Can we review sugars to make sure we still need to increase this because I will need to send in a dose increase form to PAP) Please advise! Patient verbally confirms he is taking the above medications as directed. Yes  What diet changes have been made to improve diabetes control? No diet changes   What recent interventions/DTPs have been made to improve glycemic control:  No recent changes  Have there been any recent hospitalizations or ED visits since last visit with PharmD? Yes  Patient denies hypoglycemic symptoms, including None  Patient denies hyperglycemic symptoms, including none  How often are you checking your blood sugar? in the morning before eating or drinking  What are your blood sugars ranging?  Fasting: 08/26/22 135, 59 (in middle of night), 08/27/22 132, 08/28/22 115, 08/29/22 114, 08/30/22 159, 08/31/22 196 Changed his sensors this day and they always read high after, 09/01/22 126, 09/02/22 164, 09/03/22 125, 09/04/22 169, 09/05/22 133  During the week, how often does your blood glucose drop below 70?  Once in the last 2 weeks  Are you checking your feet daily/regularly? Yes  Adherence Review: Is the patient currently on a STATIN medication? Yes Is the patient currently on ACE/ARB medication? Yes Does the patient have >5 day gap between last estimated fill dates? No   Chart Updates:  Recent office visits:  08/20/22 Rochel Brome MD. Telephone encounter. We sent metformin 1000 mg twice daily. Recommend increase jardiance to 25 mg daily daily   08/18/22 Rochel Brome MD. Seen for routine visit. Decease lantus to 6 units daily.  Increase metformin to 1000 mg daily.  Recent consult visits:  None  Hospital visits:  None  Medications: Outpatient Encounter Medications as of 09/03/2022  Medication Sig   aspirin EC 81 MG tablet Take 1 tablet (81 mg total) by mouth daily.   carbidopa-levodopa (SINEMET CR) 50-200 MG tablet Take 1 tablet by mouth every evening.    carbidopa-levodopa (SINEMET IR) 25-250 MG tablet Take 1 tablet by mouth 5 (five) times daily.   Cyanocobalamin 1000 MCG CAPS Take 1 tablet by mouth daily.   empagliflozin (JARDIANCE) 10 MG TABS tablet TAKE 1 TABLET BY MOUTH EVERY DAY BEFORE BREAKFAST   ezetimibe (ZETIA) 10 MG tablet Take 1 tablet (10 mg total) by mouth daily.   insulin glargine (LANTUS) 100 UNIT/ML injection Inject 9 Units into the skin daily.   metFORMIN (GLUCOPHAGE) 1000 MG tablet Take 1 tablet (1,000 mg total) by mouth 2 (two) times daily with a meal.   metoprolol succinate (TOPROL XL) 25 MG 24 hr tablet Take 1 tablet (25 mg total) by mouth daily.   mirtazapine (REMERON) 15 MG tablet Take 7.5 mg by mouth at bedtime.   Multiple Vitamin (MULTIVITAMIN) tablet Take 1 tablet by mouth daily.   oxybutynin (DITROPAN) 5 MG tablet Take 5 mg by mouth daily.   rosuvastatin (CRESTOR) 10 MG tablet Take 1 tablet by mouth daily before breakfast.   sacubitril-valsartan (ENTRESTO) 24-26 MG Take 1 tablet by mouth 2 (two) times daily.   tamsulosin (FLOMAX) 0.4 MG CAPS capsule SMARTSIG:1 Capsule(s) By Mouth Every Evening   No facility-administered encounter medications on file  as of 09/03/2022.    Recent Relevant Labs: Lab Results  Component Value Date/Time   HGBA1C 7.8 (H) 08/14/2022 08:14 AM   HGBA1C 7.3 (H) 05/12/2022 08:35 AM   MICROALBUR 80 01/17/2020 08:38 AM    Kidney Function Lab Results  Component Value Date/Time   CREATININE 0.84 08/14/2022 08:14 AM   CREATININE 0.86 05/12/2022 08:35 AM   GFRNONAA 76 05/03/2020 10:04 AM   GFRAA 87 05/03/2020 10:04 AM    Star Rating Drugs:   Medication:  Last Fill: Day Supply Metformin                    08/20/22-07/30/22 90ds Empagliflozin               Received PAP on January 2024 for 90ds  Care Gaps: Annual wellness visit in last year? No, 01/25/19 Last eye exam / retinopathy screening:02/17/22 Last diabetic foot exam:08/18/22   Elray Mcgregor, [place] Pharmacist Assistant  [phone]

## 2022-09-03 NOTE — Patient Instructions (Signed)
Medication Instructions:  Your physician has recommended you make the following change in your medication:   START; Rosuvastatin 10 mg every M-W-F  *If you need a refill on your cardiac medications before your next appointment, please call your pharmacy*   Lab Work: None If you have labs (blood work) drawn today and your tests are completely normal, you will receive your results only by: [place] (if you have MyChart) OR A paper copy in the mail If you have any lab test that is abnormal or we need to change your treatment, we will call you to review the results.   Testing/Procedures: None   Follow-Up: At [facility], you and your health needs are our priority.  As part of our continuing mission to provide you with exceptional heart care, we have created designated Provider Care Teams.  These Care Teams include your primary Cardiologist (physician) and Advanced Practice Providers (APPs -  Physician Assistants and Nurse Practitioners) who all work together to provide you with the care you need, when you need it.  We recommend signing up for the patient portal called "MyChart".  Sign up information is provided on this After Visit Summary.  MyChart is used to connect with patients for Virtual Visits (Telemedicine).  Patients are able to view lab/test results, encounter notes, upcoming appointments, etc.  Non-urgent messages can be sent to your provider as well.   To learn more about what you can do with MyChart, go to [email or web address].    Your next appointment:   6 month(s)  Provider:   Shirlee More, MD    Other Instructions None

## 2022-09-04 ENCOUNTER — Encounter: Payer: Self-pay | Admitting: Cardiology

## 2022-09-04 ENCOUNTER — Other Ambulatory Visit: Payer: Self-pay

## 2022-09-04 MED ORDER — ROSUVASTATIN CALCIUM 10 MG PO TABS: 10.0000 mg | ORAL_TABLET | ORAL | 3 refills | Status: DC

## 2022-09-05 NOTE — Telephone Encounter (Cosign Needed)
Called pt back and he has been informed and understood.  Elray Mcgregor, [place] Pharmacist Assistant  [phone]

## 2022-09-08 ENCOUNTER — Encounter: Payer: Self-pay | Admitting: Family Medicine

## 2022-09-11 DIAGNOSIS — M5136 Other intervertebral disc degeneration, lumbar region: Secondary | ICD-10-CM | POA: Diagnosis not present

## 2022-09-11 DIAGNOSIS — M545 Low back pain, unspecified: Secondary | ICD-10-CM | POA: Diagnosis not present

## 2022-09-11 DIAGNOSIS — M533 Sacrococcygeal disorders, not elsewhere classified: Secondary | ICD-10-CM | POA: Diagnosis not present

## 2022-09-11 DIAGNOSIS — I509 Heart failure, unspecified: Secondary | ICD-10-CM | POA: Diagnosis not present

## 2022-09-11 DIAGNOSIS — R102 Pelvic and perineal pain: Secondary | ICD-10-CM | POA: Diagnosis not present

## 2022-09-12 DIAGNOSIS — R2681 Unsteadiness on feet: Secondary | ICD-10-CM | POA: Diagnosis not present

## 2022-09-12 DIAGNOSIS — M2550 Pain in unspecified joint: Secondary | ICD-10-CM | POA: Diagnosis not present

## 2022-09-12 DIAGNOSIS — E785 Hyperlipidemia, unspecified: Secondary | ICD-10-CM | POA: Diagnosis not present

## 2022-09-12 DIAGNOSIS — J45909 Unspecified asthma, uncomplicated: Secondary | ICD-10-CM | POA: Diagnosis not present

## 2022-09-12 DIAGNOSIS — F32A Depression, unspecified: Secondary | ICD-10-CM | POA: Diagnosis not present

## 2022-09-12 DIAGNOSIS — I25709 Atherosclerosis of coronary artery bypass graft(s), unspecified, with unspecified angina pectoris: Secondary | ICD-10-CM | POA: Diagnosis not present

## 2022-09-12 DIAGNOSIS — L97319 Non-pressure chronic ulcer of right ankle with unspecified severity: Secondary | ICD-10-CM | POA: Diagnosis not present

## 2022-09-12 DIAGNOSIS — R339 Retention of urine, unspecified: Secondary | ICD-10-CM | POA: Diagnosis not present

## 2022-09-12 DIAGNOSIS — M5136 Other intervertebral disc degeneration, lumbar region: Secondary | ICD-10-CM | POA: Diagnosis not present

## 2022-09-12 DIAGNOSIS — Z880 Allergy status to penicillin: Secondary | ICD-10-CM | POA: Diagnosis not present

## 2022-09-12 DIAGNOSIS — E119 Type 2 diabetes mellitus without complications: Secondary | ICD-10-CM | POA: Diagnosis not present

## 2022-09-12 DIAGNOSIS — E875 Hyperkalemia: Secondary | ICD-10-CM | POA: Diagnosis present

## 2022-09-12 DIAGNOSIS — I25810 Atherosclerosis of coronary artery bypass graft(s) without angina pectoris: Secondary | ICD-10-CM | POA: Diagnosis not present

## 2022-09-12 DIAGNOSIS — N401 Enlarged prostate with lower urinary tract symptoms: Secondary | ICD-10-CM | POA: Diagnosis present

## 2022-09-12 DIAGNOSIS — L89893 Pressure ulcer of other site, stage 3: Secondary | ICD-10-CM | POA: Diagnosis not present

## 2022-09-12 DIAGNOSIS — Z950 Presence of cardiac pacemaker: Secondary | ICD-10-CM | POA: Diagnosis not present

## 2022-09-12 DIAGNOSIS — M6281 Muscle weakness (generalized): Secondary | ICD-10-CM | POA: Diagnosis not present

## 2022-09-12 DIAGNOSIS — E1110 Type 2 diabetes mellitus with ketoacidosis without coma: Secondary | ICD-10-CM | POA: Diagnosis not present

## 2022-09-12 DIAGNOSIS — I509 Heart failure, unspecified: Secondary | ICD-10-CM | POA: Diagnosis not present

## 2022-09-12 DIAGNOSIS — N400 Enlarged prostate without lower urinary tract symptoms: Secondary | ICD-10-CM | POA: Diagnosis not present

## 2022-09-12 DIAGNOSIS — M549 Dorsalgia, unspecified: Secondary | ICD-10-CM | POA: Diagnosis not present

## 2022-09-12 DIAGNOSIS — I5022 Chronic systolic (congestive) heart failure: Secondary | ICD-10-CM | POA: Diagnosis not present

## 2022-09-12 DIAGNOSIS — G20A1 Parkinson's disease without dyskinesia, without mention of fluctuations: Secondary | ICD-10-CM | POA: Diagnosis not present

## 2022-09-12 DIAGNOSIS — I2510 Atherosclerotic heart disease of native coronary artery without angina pectoris: Secondary | ICD-10-CM | POA: Diagnosis not present

## 2022-09-12 DIAGNOSIS — W19XXXA Unspecified fall, initial encounter: Secondary | ICD-10-CM | POA: Diagnosis not present

## 2022-09-12 DIAGNOSIS — Z888 Allergy status to other drugs, medicaments and biological substances status: Secondary | ICD-10-CM | POA: Diagnosis not present

## 2022-09-12 DIAGNOSIS — Z951 Presence of aortocoronary bypass graft: Secondary | ICD-10-CM | POA: Diagnosis not present

## 2022-09-12 DIAGNOSIS — R471 Dysarthria and anarthria: Secondary | ICD-10-CM | POA: Diagnosis not present

## 2022-09-12 DIAGNOSIS — Z882 Allergy status to sulfonamides status: Secondary | ICD-10-CM | POA: Diagnosis not present

## 2022-09-12 DIAGNOSIS — L89513 Pressure ulcer of right ankle, stage 3: Secondary | ICD-10-CM | POA: Diagnosis not present

## 2022-09-12 DIAGNOSIS — Z7401 Bed confinement status: Secondary | ICD-10-CM | POA: Diagnosis not present

## 2022-09-12 DIAGNOSIS — I110 Hypertensive heart disease with heart failure: Secondary | ICD-10-CM | POA: Diagnosis present

## 2022-09-12 DIAGNOSIS — M533 Sacrococcygeal disorders, not elsewhere classified: Secondary | ICD-10-CM | POA: Diagnosis not present

## 2022-09-12 DIAGNOSIS — E860 Dehydration: Secondary | ICD-10-CM | POA: Diagnosis not present

## 2022-09-12 DIAGNOSIS — E1100 Type 2 diabetes mellitus with hyperosmolarity without nonketotic hyperglycemic-hyperosmolar coma (NKHHC): Secondary | ICD-10-CM | POA: Diagnosis not present

## 2022-09-12 DIAGNOSIS — I10 Essential (primary) hypertension: Secondary | ICD-10-CM | POA: Diagnosis not present

## 2022-09-12 DIAGNOSIS — R296 Repeated falls: Secondary | ICD-10-CM | POA: Diagnosis not present

## 2022-09-12 DIAGNOSIS — R2689 Other abnormalities of gait and mobility: Secondary | ICD-10-CM | POA: Diagnosis not present

## 2022-09-12 DIAGNOSIS — L97329 Non-pressure chronic ulcer of left ankle with unspecified severity: Secondary | ICD-10-CM | POA: Diagnosis not present

## 2022-09-12 DIAGNOSIS — R102 Pelvic and perineal pain: Secondary | ICD-10-CM | POA: Diagnosis not present

## 2022-09-12 DIAGNOSIS — Z7982 Long term (current) use of aspirin: Secondary | ICD-10-CM | POA: Diagnosis not present

## 2022-09-12 DIAGNOSIS — L89153 Pressure ulcer of sacral region, stage 3: Secondary | ICD-10-CM | POA: Diagnosis not present

## 2022-09-12 DIAGNOSIS — I252 Old myocardial infarction: Secondary | ICD-10-CM | POA: Diagnosis not present

## 2022-09-12 DIAGNOSIS — M545 Low back pain, unspecified: Secondary | ICD-10-CM | POA: Diagnosis not present

## 2022-09-12 DIAGNOSIS — R278 Other lack of coordination: Secondary | ICD-10-CM | POA: Diagnosis not present

## 2022-09-12 DIAGNOSIS — R739 Hyperglycemia, unspecified: Secondary | ICD-10-CM | POA: Diagnosis not present

## 2022-09-12 DIAGNOSIS — N179 Acute kidney failure, unspecified: Secondary | ICD-10-CM | POA: Diagnosis not present

## 2022-09-12 DIAGNOSIS — I959 Hypotension, unspecified: Secondary | ICD-10-CM | POA: Diagnosis not present

## 2022-09-12 DIAGNOSIS — E11622 Type 2 diabetes mellitus with other skin ulcer: Secondary | ICD-10-CM | POA: Diagnosis present

## 2022-09-12 DIAGNOSIS — R531 Weakness: Secondary | ICD-10-CM | POA: Diagnosis present

## 2022-09-12 DIAGNOSIS — E46 Unspecified protein-calorie malnutrition: Secondary | ICD-10-CM | POA: Diagnosis not present

## 2022-09-12 DIAGNOSIS — R338 Other retention of urine: Secondary | ICD-10-CM | POA: Diagnosis not present

## 2022-09-12 NOTE — Telephone Encounter (Cosign Needed)
Good morning!  Could you send the increased dose of Jardiance 25mg  to   [employer] [facility] [facility] Phone: [phone], [place] [address] Fax: [phone]   Thank you  Elray Mcgregor, [place] Pharmacist Assistant  [phone]

## 2022-09-14 ENCOUNTER — Encounter: Payer: Self-pay | Admitting: Internal Medicine

## 2022-09-15 ENCOUNTER — Other Ambulatory Visit: Payer: Self-pay | Admitting: Family Medicine

## 2022-09-15 MED ORDER — EMPAGLIFLOZIN 25 MG PO TABS: 25.0000 mg | ORAL_TABLET | Freq: Every day | ORAL | 3 refills | Status: DC

## 2022-09-15 NOTE — Telephone Encounter (Signed)
Awaiting Dr. Olin Pia recommendations.

## 2022-09-16 DIAGNOSIS — L89893 Pressure ulcer of other site, stage 3: Secondary | ICD-10-CM | POA: Diagnosis not present

## 2022-09-16 DIAGNOSIS — I10 Essential (primary) hypertension: Secondary | ICD-10-CM | POA: Diagnosis not present

## 2022-09-16 DIAGNOSIS — M6281 Muscle weakness (generalized): Secondary | ICD-10-CM | POA: Diagnosis not present

## 2022-09-16 DIAGNOSIS — E785 Hyperlipidemia, unspecified: Secondary | ICD-10-CM | POA: Diagnosis not present

## 2022-09-16 DIAGNOSIS — L89513 Pressure ulcer of right ankle, stage 3: Secondary | ICD-10-CM | POA: Diagnosis not present

## 2022-09-16 DIAGNOSIS — R2681 Unsteadiness on feet: Secondary | ICD-10-CM | POA: Diagnosis not present

## 2022-09-16 DIAGNOSIS — Z7401 Bed confinement status: Secondary | ICD-10-CM | POA: Diagnosis not present

## 2022-09-16 DIAGNOSIS — I509 Heart failure, unspecified: Secondary | ICD-10-CM | POA: Diagnosis not present

## 2022-09-16 DIAGNOSIS — G20A1 Parkinson's disease without dyskinesia, without mention of fluctuations: Secondary | ICD-10-CM | POA: Diagnosis not present

## 2022-09-16 DIAGNOSIS — R471 Dysarthria and anarthria: Secondary | ICD-10-CM | POA: Diagnosis not present

## 2022-09-16 DIAGNOSIS — I25709 Atherosclerosis of coronary artery bypass graft(s), unspecified, with unspecified angina pectoris: Secondary | ICD-10-CM | POA: Diagnosis not present

## 2022-09-16 DIAGNOSIS — R262 Difficulty in walking, not elsewhere classified: Secondary | ICD-10-CM | POA: Diagnosis not present

## 2022-09-16 DIAGNOSIS — I2510 Atherosclerotic heart disease of native coronary artery without angina pectoris: Secondary | ICD-10-CM | POA: Diagnosis not present

## 2022-09-16 DIAGNOSIS — N179 Acute kidney failure, unspecified: Secondary | ICD-10-CM | POA: Diagnosis not present

## 2022-09-16 DIAGNOSIS — R278 Other lack of coordination: Secondary | ICD-10-CM | POA: Diagnosis not present

## 2022-09-16 DIAGNOSIS — R2689 Other abnormalities of gait and mobility: Secondary | ICD-10-CM | POA: Diagnosis not present

## 2022-09-16 DIAGNOSIS — E1151 Type 2 diabetes mellitus with diabetic peripheral angiopathy without gangrene: Secondary | ICD-10-CM | POA: Diagnosis not present

## 2022-09-16 DIAGNOSIS — E46 Unspecified protein-calorie malnutrition: Secondary | ICD-10-CM | POA: Diagnosis not present

## 2022-09-16 DIAGNOSIS — L89153 Pressure ulcer of sacral region, stage 3: Secondary | ICD-10-CM | POA: Diagnosis not present

## 2022-09-16 DIAGNOSIS — N400 Enlarged prostate without lower urinary tract symptoms: Secondary | ICD-10-CM | POA: Diagnosis not present

## 2022-09-16 DIAGNOSIS — R339 Retention of urine, unspecified: Secondary | ICD-10-CM | POA: Diagnosis not present

## 2022-09-16 DIAGNOSIS — I495 Sick sinus syndrome: Secondary | ICD-10-CM | POA: Diagnosis not present

## 2022-09-16 DIAGNOSIS — I25810 Atherosclerosis of coronary artery bypass graft(s) without angina pectoris: Secondary | ICD-10-CM | POA: Diagnosis not present

## 2022-09-16 DIAGNOSIS — I5022 Chronic systolic (congestive) heart failure: Secondary | ICD-10-CM | POA: Diagnosis not present

## 2022-09-16 DIAGNOSIS — F4322 Adjustment disorder with anxiety: Secondary | ICD-10-CM | POA: Diagnosis not present

## 2022-09-16 DIAGNOSIS — M2550 Pain in unspecified joint: Secondary | ICD-10-CM | POA: Diagnosis not present

## 2022-09-16 DIAGNOSIS — F32A Depression, unspecified: Secondary | ICD-10-CM | POA: Diagnosis not present

## 2022-09-16 DIAGNOSIS — E1100 Type 2 diabetes mellitus with hyperosmolarity without nonketotic hyperglycemic-hyperosmolar coma (NKHHC): Secondary | ICD-10-CM | POA: Diagnosis not present

## 2022-09-16 DIAGNOSIS — E1110 Type 2 diabetes mellitus with ketoacidosis without coma: Secondary | ICD-10-CM | POA: Diagnosis not present

## 2022-09-16 DIAGNOSIS — M545 Low back pain, unspecified: Secondary | ICD-10-CM | POA: Diagnosis not present

## 2022-09-17 ENCOUNTER — Ambulatory Visit: Payer: Medicare Other | Admitting: Podiatry

## 2022-09-17 DIAGNOSIS — I2510 Atherosclerotic heart disease of native coronary artery without angina pectoris: Secondary | ICD-10-CM | POA: Diagnosis not present

## 2022-09-17 DIAGNOSIS — E1151 Type 2 diabetes mellitus with diabetic peripheral angiopathy without gangrene: Secondary | ICD-10-CM | POA: Diagnosis not present

## 2022-09-17 DIAGNOSIS — R262 Difficulty in walking, not elsewhere classified: Secondary | ICD-10-CM | POA: Diagnosis not present

## 2022-09-17 DIAGNOSIS — I509 Heart failure, unspecified: Secondary | ICD-10-CM | POA: Diagnosis not present

## 2022-09-18 DIAGNOSIS — F4322 Adjustment disorder with anxiety: Secondary | ICD-10-CM | POA: Diagnosis not present

## 2022-09-24 DIAGNOSIS — L89153 Pressure ulcer of sacral region, stage 3: Secondary | ICD-10-CM | POA: Diagnosis not present

## 2022-09-24 DIAGNOSIS — L89513 Pressure ulcer of right ankle, stage 3: Secondary | ICD-10-CM | POA: Diagnosis not present

## 2022-10-01 DIAGNOSIS — L89513 Pressure ulcer of right ankle, stage 3: Secondary | ICD-10-CM | POA: Diagnosis not present

## 2022-10-01 DIAGNOSIS — L89153 Pressure ulcer of sacral region, stage 3: Secondary | ICD-10-CM | POA: Diagnosis not present

## 2022-10-08 ENCOUNTER — Ambulatory Visit (INDEPENDENT_AMBULATORY_CARE_PROVIDER_SITE_OTHER): Payer: Medicare Other

## 2022-10-08 DIAGNOSIS — L89513 Pressure ulcer of right ankle, stage 3: Secondary | ICD-10-CM | POA: Diagnosis not present

## 2022-10-08 DIAGNOSIS — I495 Sick sinus syndrome: Secondary | ICD-10-CM

## 2022-10-08 DIAGNOSIS — L89153 Pressure ulcer of sacral region, stage 3: Secondary | ICD-10-CM | POA: Diagnosis not present

## 2022-10-08 LAB — CUP PACEART REMOTE DEVICE CHECK
Battery Remaining Longevity: 27 mo
Battery Voltage: 2.94 V
Brady Statistic AP VP Percent: 2.16 %
Brady Statistic AP VS Percent: 0.01 %
Brady Statistic AS VP Percent: 96.52 %
Brady Statistic AS VS Percent: 1.31 %
Brady Statistic RA Percent Paced: 2.14 %
Brady Statistic RV Percent Paced: 96.57 %
Date Time Interrogation Session: 20240410043824
HighPow Impedance: 44 Ohm
Implantable Lead Connection Status: 753985
Implantable Lead Connection Status: 753985
Implantable Lead Connection Status: 753985
Implantable Lead Implant Date: 20111213
Implantable Lead Implant Date: 20191230
Implantable Lead Implant Date: 20191230
Implantable Lead Location: 753858
Implantable Lead Location: 753859
Implantable Lead Location: 753860
Implantable Lead Model: 5076
Implantable Pulse Generator Implant Date: 20191230
Lead Channel Impedance Value: 180 Ohm
Lead Channel Impedance Value: 180 Ohm
Lead Channel Impedance Value: 195.429
Lead Channel Impedance Value: 207.273
Lead Channel Impedance Value: 207.273
Lead Channel Impedance Value: 285 Ohm
Lead Channel Impedance Value: 342 Ohm
Lead Channel Impedance Value: 342 Ohm
Lead Channel Impedance Value: 380 Ohm
Lead Channel Impedance Value: 380 Ohm
Lead Channel Impedance Value: 399 Ohm
Lead Channel Impedance Value: 456 Ohm
Lead Channel Impedance Value: 608 Ohm
Lead Channel Impedance Value: 646 Ohm
Lead Channel Impedance Value: 646 Ohm
Lead Channel Impedance Value: 722 Ohm
Lead Channel Impedance Value: 760 Ohm
Lead Channel Impedance Value: 760 Ohm
Lead Channel Pacing Threshold Amplitude: 0.625 V
Lead Channel Pacing Threshold Amplitude: 0.75 V
Lead Channel Pacing Threshold Amplitude: 1 V
Lead Channel Pacing Threshold Pulse Width: 0.4 ms
Lead Channel Pacing Threshold Pulse Width: 0.4 ms
Lead Channel Pacing Threshold Pulse Width: 0.4 ms
Lead Channel Sensing Intrinsic Amplitude: 1.875 mV
Lead Channel Sensing Intrinsic Amplitude: 1.875 mV
Lead Channel Sensing Intrinsic Amplitude: 19.625 mV
Lead Channel Sensing Intrinsic Amplitude: 19.625 mV
Lead Channel Setting Pacing Amplitude: 1.25 V
Lead Channel Setting Pacing Amplitude: 2 V
Lead Channel Setting Pacing Amplitude: 2 V
Lead Channel Setting Pacing Pulse Width: 0.4 ms
Lead Channel Setting Pacing Pulse Width: 0.4 ms
Lead Channel Setting Sensing Sensitivity: 0.45 mV
Zone Setting Status: 755011
Zone Setting Status: 755011

## 2022-10-09 DIAGNOSIS — G20A1 Parkinson's disease without dyskinesia, without mention of fluctuations: Secondary | ICD-10-CM | POA: Diagnosis not present

## 2022-10-14 ENCOUNTER — Telehealth: Payer: Self-pay

## 2022-10-14 NOTE — Progress Notes (Signed)
Care Management & Coordination Services Pharmacy Team  Reason for Encounter: Diabetes  Contacted patient to discuss diabetes disease state. Spoke with family on 10/27/2022   Current antihyperglycemic regimen:  Lantus 10-14 units in AM Metformin 1000 mg bid  Empagliflozin 25mg  daily  Patient verbally confirms he is taking the above medications as directed. Yes  What diet changes have been made to improve diabetes control? Wife is making sure he eats healthy  What recent interventions/DTPs have been made to improve glycemic control:  Pt was in rehab for 4 weeks from 09/16/22-10/16/22 for falls due to Parkinson's and due to high blood sugars. They increased his Lantus to 12 units in am and pm due to high readings but his sugars were way too low. Since they have been out of the hospital and rehab wife is given him 10-12 units in the mornings depending on what sugars are.   Have there been any recent hospitalizations or ED visits since last visit with PharmD? Yes, pt fell a couple of times and went to the hospital and now he is in rehab and possibly be out this week.   Patient denies hypoglycemic symptoms, including None  Patient denies hyperglycemic symptoms, including none  How often are you checking your blood sugar? in the morning before eating or drinking  What are your blood sugars ranging?  Fasting:  10/18/22 142, 10/19/22 101, 4/22.24 148, 10/21/22 133, 10/22/22 219, 10/23/22 193, 10/24/22 147, 10/25/22 225, 10/26/22 194, 10/27/22 116  During the week, how often does your blood glucose drop below 70? Never  Are you checking your feet daily/regularly? Yes  Adherence Review: Is the patient currently on a STATIN medication? Yes Is the patient currently on ACE/ARB medication? Yes Does the patient have >5 day gap between last estimated fill dates? No   Chart Updates:  Recent office visits:  09/15/22 Blane Ohara MD. Orders Only. Increased Jardiance to 25mg .   Recent consult visits:   10/09/22 (Neurology) Morene Rankins MD. Telemedicine visit. Lets switch to Carbidopa/levodopa 25/100 2 tabs 5 times a day   09/03/22 (Cardiology) Norman Herrlich MD. Seen for Neurogenic Orthostatic Hypotension. START; Rosuvastatin 10 mg every M-W-F.  09/03/22 (Podiatry) Louann Sjogren DPM. Seen for Foot problem. Started on Cephalexin 500mg .   Hospital visits:  None  Medications: Outpatient Encounter Medications as of 10/14/2022  Medication Sig   empagliflozin (JARDIANCE) 25 MG TABS tablet Take 1 tablet (25 mg total) by mouth daily before breakfast.   aspirin EC 81 MG tablet Take 1 tablet (81 mg total) by mouth daily.   carbidopa-levodopa (SINEMET CR) 50-200 MG tablet Take 1 tablet by mouth every evening.    carbidopa-levodopa (SINEMET IR) 25-250 MG tablet Take 1 tablet by mouth 5 (five) times daily.   Cyanocobalamin 1000 MCG CAPS Take 1 tablet by mouth daily.   ezetimibe (ZETIA) 10 MG tablet Take 1 tablet (10 mg total) by mouth daily.   insulin glargine (LANTUS) 100 UNIT/ML injection Inject 9 Units into the skin daily.   metFORMIN (GLUCOPHAGE) 1000 MG tablet Take 1 tablet (1,000 mg total) by mouth 2 (two) times daily with a meal.   metoprolol succinate (TOPROL XL) 25 MG 24 hr tablet Take 1 tablet (25 mg total) by mouth daily.   mirtazapine (REMERON) 15 MG tablet Take 7.5 mg by mouth at bedtime.   Multiple Vitamin (MULTIVITAMIN) tablet Take 1 tablet by mouth daily.   oxybutynin (DITROPAN) 5 MG tablet Take 5 mg by mouth daily.   rosuvastatin (CRESTOR) 10 MG  tablet Take 1 tablet (10 mg total) by mouth every Monday, Wednesday, and Friday.   sacubitril-valsartan (ENTRESTO) 24-26 MG Take 1 tablet by mouth 2 (two) times daily.   tamsulosin (FLOMAX) 0.4 MG CAPS capsule SMARTSIG:1 Capsule(s) By Mouth Every Evening   No facility-administered encounter medications on file as of 10/14/2022.    Recent Relevant Labs: Lab Results  Component Value Date/Time   HGBA1C 7.8 (H) 08/14/2022 08:14 AM   HGBA1C  7.3 (H) 05/12/2022 08:35 AM   MICROALBUR 80 01/17/2020 08:38 AM    Kidney Function Lab Results  Component Value Date/Time   CREATININE 0.84 08/14/2022 08:14 AM   CREATININE 0.86 05/12/2022 08:35 AM   GFRNONAA 76 05/03/2020 10:04 AM   GFRAA 87 05/03/2020 10:04 AM    Star Rating Drugs:  Medication:  Last Fill: Day Supply Metformin   08/20/22-07/30/22 90ds Jardiance (PAP) 1.5 bottle left   Care Gaps: Annual wellness visit in last year? No Last eye exam / retinopathy screening:02/17/22 Last diabetic foot exam:08/18/22   Roxana Hires, [facility] Clinical Pharmacist Assistant  [phone]

## 2022-10-15 DIAGNOSIS — L89153 Pressure ulcer of sacral region, stage 3: Secondary | ICD-10-CM | POA: Diagnosis not present

## 2022-10-15 DIAGNOSIS — L89513 Pressure ulcer of right ankle, stage 3: Secondary | ICD-10-CM | POA: Diagnosis not present

## 2022-10-16 ENCOUNTER — Telehealth: Payer: Self-pay

## 2022-10-16 NOTE — Telephone Encounter (Signed)
Patient wife picked up Lantus PAP

## 2022-10-20 ENCOUNTER — Telehealth: Payer: Self-pay

## 2022-10-20 DIAGNOSIS — I255 Ischemic cardiomyopathy: Secondary | ICD-10-CM | POA: Diagnosis not present

## 2022-10-20 DIAGNOSIS — I2510 Atherosclerotic heart disease of native coronary artery without angina pectoris: Secondary | ICD-10-CM | POA: Diagnosis not present

## 2022-10-20 DIAGNOSIS — I700 Atherosclerosis of aorta: Secondary | ICD-10-CM | POA: Diagnosis not present

## 2022-10-20 DIAGNOSIS — E46 Unspecified protein-calorie malnutrition: Secondary | ICD-10-CM | POA: Diagnosis not present

## 2022-10-20 DIAGNOSIS — L89150 Pressure ulcer of sacral region, unstageable: Secondary | ICD-10-CM | POA: Diagnosis not present

## 2022-10-20 DIAGNOSIS — G8929 Other chronic pain: Secondary | ICD-10-CM | POA: Diagnosis not present

## 2022-10-20 DIAGNOSIS — Z7982 Long term (current) use of aspirin: Secondary | ICD-10-CM | POA: Diagnosis not present

## 2022-10-20 DIAGNOSIS — M519 Unspecified thoracic, thoracolumbar and lumbosacral intervertebral disc disorder: Secondary | ICD-10-CM | POA: Diagnosis not present

## 2022-10-20 DIAGNOSIS — Z95810 Presence of automatic (implantable) cardiac defibrillator: Secondary | ICD-10-CM | POA: Diagnosis not present

## 2022-10-20 DIAGNOSIS — I5022 Chronic systolic (congestive) heart failure: Secondary | ICD-10-CM | POA: Diagnosis not present

## 2022-10-20 DIAGNOSIS — Z7984 Long term (current) use of oral hypoglycemic drugs: Secondary | ICD-10-CM | POA: Diagnosis not present

## 2022-10-20 DIAGNOSIS — Z794 Long term (current) use of insulin: Secondary | ICD-10-CM | POA: Diagnosis not present

## 2022-10-20 DIAGNOSIS — L89510 Pressure ulcer of right ankle, unstageable: Secondary | ICD-10-CM | POA: Diagnosis not present

## 2022-10-20 DIAGNOSIS — E1169 Type 2 diabetes mellitus with other specified complication: Secondary | ICD-10-CM | POA: Diagnosis not present

## 2022-10-20 DIAGNOSIS — Z556 Problems related to health literacy: Secondary | ICD-10-CM | POA: Diagnosis not present

## 2022-10-20 DIAGNOSIS — G20B2 Parkinson's disease with dyskinesia, with fluctuations: Secondary | ICD-10-CM | POA: Diagnosis not present

## 2022-10-20 DIAGNOSIS — E785 Hyperlipidemia, unspecified: Secondary | ICD-10-CM | POA: Diagnosis not present

## 2022-10-20 DIAGNOSIS — I110 Hypertensive heart disease with heart failure: Secondary | ICD-10-CM | POA: Diagnosis not present

## 2022-10-20 DIAGNOSIS — I442 Atrioventricular block, complete: Secondary | ICD-10-CM | POA: Diagnosis not present

## 2022-10-20 DIAGNOSIS — E860 Dehydration: Secondary | ICD-10-CM | POA: Diagnosis not present

## 2022-10-20 DIAGNOSIS — E11319 Type 2 diabetes mellitus with unspecified diabetic retinopathy without macular edema: Secondary | ICD-10-CM | POA: Diagnosis not present

## 2022-10-20 DIAGNOSIS — N400 Enlarged prostate without lower urinary tract symptoms: Secondary | ICD-10-CM | POA: Diagnosis not present

## 2022-10-20 DIAGNOSIS — Z951 Presence of aortocoronary bypass graft: Secondary | ICD-10-CM | POA: Diagnosis not present

## 2022-10-20 DIAGNOSIS — M1990 Unspecified osteoarthritis, unspecified site: Secondary | ICD-10-CM | POA: Diagnosis not present

## 2022-10-20 NOTE — Telephone Encounter (Signed)
Nurse from [facility] called requesting orders for patient states that [facility] just discharged the patient and would like orders for nursing med management and education for 2 visits a week for 4 weeks and 1 visit a week for 2 weeks, and also for changing dressings on 2 separate wounds. Patient has a sacral wound and a wound on the outer right ankle. She also requested CNA aide to go out 1-2 times a week for assitance with bathing and care. Called her back to give her Dr. Renea Ee approval.

## 2022-10-21 DIAGNOSIS — E46 Unspecified protein-calorie malnutrition: Secondary | ICD-10-CM | POA: Diagnosis not present

## 2022-10-21 DIAGNOSIS — I110 Hypertensive heart disease with heart failure: Secondary | ICD-10-CM | POA: Diagnosis not present

## 2022-10-21 DIAGNOSIS — E11319 Type 2 diabetes mellitus with unspecified diabetic retinopathy without macular edema: Secondary | ICD-10-CM | POA: Diagnosis not present

## 2022-10-21 DIAGNOSIS — L89510 Pressure ulcer of right ankle, unstageable: Secondary | ICD-10-CM | POA: Diagnosis not present

## 2022-10-21 DIAGNOSIS — L89150 Pressure ulcer of sacral region, unstageable: Secondary | ICD-10-CM | POA: Diagnosis not present

## 2022-10-21 DIAGNOSIS — I5022 Chronic systolic (congestive) heart failure: Secondary | ICD-10-CM | POA: Diagnosis not present

## 2022-10-23 DIAGNOSIS — I5022 Chronic systolic (congestive) heart failure: Secondary | ICD-10-CM | POA: Diagnosis not present

## 2022-10-23 DIAGNOSIS — I110 Hypertensive heart disease with heart failure: Secondary | ICD-10-CM | POA: Diagnosis not present

## 2022-10-23 DIAGNOSIS — E46 Unspecified protein-calorie malnutrition: Secondary | ICD-10-CM | POA: Diagnosis not present

## 2022-10-23 DIAGNOSIS — L89510 Pressure ulcer of right ankle, unstageable: Secondary | ICD-10-CM | POA: Diagnosis not present

## 2022-10-23 DIAGNOSIS — E11319 Type 2 diabetes mellitus with unspecified diabetic retinopathy without macular edema: Secondary | ICD-10-CM | POA: Diagnosis not present

## 2022-10-23 DIAGNOSIS — L89150 Pressure ulcer of sacral region, unstageable: Secondary | ICD-10-CM | POA: Diagnosis not present

## 2022-10-24 DIAGNOSIS — E11319 Type 2 diabetes mellitus with unspecified diabetic retinopathy without macular edema: Secondary | ICD-10-CM | POA: Diagnosis not present

## 2022-10-24 DIAGNOSIS — I110 Hypertensive heart disease with heart failure: Secondary | ICD-10-CM | POA: Diagnosis not present

## 2022-10-24 DIAGNOSIS — L89510 Pressure ulcer of right ankle, unstageable: Secondary | ICD-10-CM | POA: Diagnosis not present

## 2022-10-24 DIAGNOSIS — L89150 Pressure ulcer of sacral region, unstageable: Secondary | ICD-10-CM | POA: Diagnosis not present

## 2022-10-24 DIAGNOSIS — I5022 Chronic systolic (congestive) heart failure: Secondary | ICD-10-CM | POA: Diagnosis not present

## 2022-10-24 DIAGNOSIS — E46 Unspecified protein-calorie malnutrition: Secondary | ICD-10-CM | POA: Diagnosis not present

## 2022-10-26 ENCOUNTER — Other Ambulatory Visit: Payer: Self-pay | Admitting: Family Medicine

## 2022-10-26 DIAGNOSIS — E1169 Type 2 diabetes mellitus with other specified complication: Secondary | ICD-10-CM

## 2022-10-27 ENCOUNTER — Telehealth: Payer: Self-pay

## 2022-10-27 DIAGNOSIS — E46 Unspecified protein-calorie malnutrition: Secondary | ICD-10-CM | POA: Diagnosis not present

## 2022-10-27 DIAGNOSIS — L89150 Pressure ulcer of sacral region, unstageable: Secondary | ICD-10-CM | POA: Diagnosis not present

## 2022-10-27 DIAGNOSIS — I5022 Chronic systolic (congestive) heart failure: Secondary | ICD-10-CM | POA: Diagnosis not present

## 2022-10-27 DIAGNOSIS — E11319 Type 2 diabetes mellitus with unspecified diabetic retinopathy without macular edema: Secondary | ICD-10-CM | POA: Diagnosis not present

## 2022-10-27 DIAGNOSIS — L89510 Pressure ulcer of right ankle, unstageable: Secondary | ICD-10-CM | POA: Diagnosis not present

## 2022-10-27 DIAGNOSIS — I110 Hypertensive heart disease with heart failure: Secondary | ICD-10-CM | POA: Diagnosis not present

## 2022-10-27 NOTE — Telephone Encounter (Signed)
Left detailed message for nicole from home health informing her that he needs to hold the furosemide until he is seen on 11/18/22 for his follow up. Told her if she has any other questions or concerns to give [place] a call back.

## 2022-10-27 NOTE — Telephone Encounter (Signed)
Joni Reining from [facility] called stating that she has been out to see the patient and has went over his medication list with the patient since being discharged from [facility] the patient has supposed to been taking furosemide 40 mg daily according to his discharge medication list. The patient's wife has not been giving the patient the furosemide 40 mg at all since being at home but has been weighing him daily. She is wanting to know do you only want to keep this PRN or are you wanting to change this back to daily medication? She states that patient only has little swelling in the legs and knows he was taking it for edema in the legs and CHF. Please advise.

## 2022-10-28 DIAGNOSIS — L89150 Pressure ulcer of sacral region, unstageable: Secondary | ICD-10-CM | POA: Diagnosis not present

## 2022-10-28 DIAGNOSIS — I5022 Chronic systolic (congestive) heart failure: Secondary | ICD-10-CM | POA: Diagnosis not present

## 2022-10-28 DIAGNOSIS — E46 Unspecified protein-calorie malnutrition: Secondary | ICD-10-CM | POA: Diagnosis not present

## 2022-10-28 DIAGNOSIS — E11319 Type 2 diabetes mellitus with unspecified diabetic retinopathy without macular edema: Secondary | ICD-10-CM | POA: Diagnosis not present

## 2022-10-28 DIAGNOSIS — I110 Hypertensive heart disease with heart failure: Secondary | ICD-10-CM | POA: Diagnosis not present

## 2022-10-28 DIAGNOSIS — L89510 Pressure ulcer of right ankle, unstageable: Secondary | ICD-10-CM | POA: Diagnosis not present

## 2022-10-30 DIAGNOSIS — E11319 Type 2 diabetes mellitus with unspecified diabetic retinopathy without macular edema: Secondary | ICD-10-CM | POA: Diagnosis not present

## 2022-10-30 DIAGNOSIS — L89150 Pressure ulcer of sacral region, unstageable: Secondary | ICD-10-CM | POA: Diagnosis not present

## 2022-10-30 DIAGNOSIS — I110 Hypertensive heart disease with heart failure: Secondary | ICD-10-CM | POA: Diagnosis not present

## 2022-10-30 DIAGNOSIS — I5022 Chronic systolic (congestive) heart failure: Secondary | ICD-10-CM | POA: Diagnosis not present

## 2022-10-30 DIAGNOSIS — L89510 Pressure ulcer of right ankle, unstageable: Secondary | ICD-10-CM | POA: Diagnosis not present

## 2022-10-30 DIAGNOSIS — E46 Unspecified protein-calorie malnutrition: Secondary | ICD-10-CM | POA: Diagnosis not present

## 2022-10-31 DIAGNOSIS — R81 Glycosuria: Secondary | ICD-10-CM | POA: Diagnosis not present

## 2022-10-31 DIAGNOSIS — I498 Other specified cardiac arrhythmias: Secondary | ICD-10-CM | POA: Diagnosis not present

## 2022-10-31 DIAGNOSIS — E119 Type 2 diabetes mellitus without complications: Secondary | ICD-10-CM | POA: Diagnosis not present

## 2022-10-31 DIAGNOSIS — R531 Weakness: Secondary | ICD-10-CM | POA: Diagnosis not present

## 2022-10-31 DIAGNOSIS — I5022 Chronic systolic (congestive) heart failure: Secondary | ICD-10-CM | POA: Diagnosis not present

## 2022-10-31 DIAGNOSIS — L89510 Pressure ulcer of right ankle, unstageable: Secondary | ICD-10-CM | POA: Diagnosis not present

## 2022-10-31 DIAGNOSIS — R739 Hyperglycemia, unspecified: Secondary | ICD-10-CM | POA: Diagnosis not present

## 2022-10-31 DIAGNOSIS — R824 Acetonuria: Secondary | ICD-10-CM | POA: Diagnosis not present

## 2022-10-31 DIAGNOSIS — I110 Hypertensive heart disease with heart failure: Secondary | ICD-10-CM | POA: Diagnosis not present

## 2022-10-31 DIAGNOSIS — N200 Calculus of kidney: Secondary | ICD-10-CM | POA: Diagnosis not present

## 2022-10-31 DIAGNOSIS — E46 Unspecified protein-calorie malnutrition: Secondary | ICD-10-CM | POA: Diagnosis not present

## 2022-10-31 DIAGNOSIS — G20A2 Parkinson's disease without dyskinesia, with fluctuations: Secondary | ICD-10-CM | POA: Diagnosis not present

## 2022-10-31 DIAGNOSIS — Z79899 Other long term (current) drug therapy: Secondary | ICD-10-CM | POA: Diagnosis not present

## 2022-10-31 DIAGNOSIS — R9431 Abnormal electrocardiogram [ECG] [EKG]: Secondary | ICD-10-CM | POA: Diagnosis not present

## 2022-10-31 DIAGNOSIS — K8020 Calculus of gallbladder without cholecystitis without obstruction: Secondary | ICD-10-CM | POA: Diagnosis not present

## 2022-10-31 DIAGNOSIS — I509 Heart failure, unspecified: Secondary | ICD-10-CM | POA: Diagnosis not present

## 2022-10-31 DIAGNOSIS — L89150 Pressure ulcer of sacral region, unstageable: Secondary | ICD-10-CM | POA: Diagnosis not present

## 2022-10-31 DIAGNOSIS — R300 Dysuria: Secondary | ICD-10-CM | POA: Diagnosis not present

## 2022-10-31 DIAGNOSIS — R319 Hematuria, unspecified: Secondary | ICD-10-CM | POA: Diagnosis not present

## 2022-10-31 DIAGNOSIS — E11319 Type 2 diabetes mellitus with unspecified diabetic retinopathy without macular edema: Secondary | ICD-10-CM | POA: Diagnosis not present

## 2022-10-31 DIAGNOSIS — N390 Urinary tract infection, site not specified: Secondary | ICD-10-CM | POA: Diagnosis not present

## 2022-11-03 DIAGNOSIS — E46 Unspecified protein-calorie malnutrition: Secondary | ICD-10-CM | POA: Diagnosis not present

## 2022-11-03 DIAGNOSIS — E11319 Type 2 diabetes mellitus with unspecified diabetic retinopathy without macular edema: Secondary | ICD-10-CM | POA: Diagnosis not present

## 2022-11-03 DIAGNOSIS — L89150 Pressure ulcer of sacral region, unstageable: Secondary | ICD-10-CM | POA: Diagnosis not present

## 2022-11-03 DIAGNOSIS — I110 Hypertensive heart disease with heart failure: Secondary | ICD-10-CM | POA: Diagnosis not present

## 2022-11-03 DIAGNOSIS — I5022 Chronic systolic (congestive) heart failure: Secondary | ICD-10-CM | POA: Diagnosis not present

## 2022-11-03 DIAGNOSIS — L89510 Pressure ulcer of right ankle, unstageable: Secondary | ICD-10-CM | POA: Diagnosis not present

## 2022-11-04 ENCOUNTER — Telehealth: Payer: Self-pay

## 2022-11-04 DIAGNOSIS — E11319 Type 2 diabetes mellitus with unspecified diabetic retinopathy without macular edema: Secondary | ICD-10-CM | POA: Diagnosis not present

## 2022-11-04 DIAGNOSIS — L89150 Pressure ulcer of sacral region, unstageable: Secondary | ICD-10-CM | POA: Diagnosis not present

## 2022-11-04 DIAGNOSIS — L89510 Pressure ulcer of right ankle, unstageable: Secondary | ICD-10-CM | POA: Diagnosis not present

## 2022-11-04 DIAGNOSIS — E46 Unspecified protein-calorie malnutrition: Secondary | ICD-10-CM | POA: Diagnosis not present

## 2022-11-04 DIAGNOSIS — I110 Hypertensive heart disease with heart failure: Secondary | ICD-10-CM | POA: Diagnosis not present

## 2022-11-04 DIAGNOSIS — I5022 Chronic systolic (congestive) heart failure: Secondary | ICD-10-CM | POA: Diagnosis not present

## 2022-11-04 NOTE — Telephone Encounter (Signed)
Joni Reining, RN, with [facility] [phone] called stating patient was seen at [facility] on 5/3 for UTI symptoms due to having ketones they sent him to the ED. RH rx'd cefuroxime 500 mg TWICE DAILY. States patients "burning" has improved but has brain fog. She was unsure if this is possibly a side effect and wanted to know if you would like to change his ax bx. Hospital notes are on your desk.

## 2022-11-05 ENCOUNTER — Ambulatory Visit (INDEPENDENT_AMBULATORY_CARE_PROVIDER_SITE_OTHER): Payer: Medicare Other | Admitting: Family Medicine

## 2022-11-05 VITALS — BP 136/60 | HR 80 | Temp 97.3°F | Resp 18 | Ht 65.5 in | Wt 131.0 lb

## 2022-11-05 DIAGNOSIS — R531 Weakness: Secondary | ICD-10-CM | POA: Diagnosis not present

## 2022-11-05 DIAGNOSIS — R300 Dysuria: Secondary | ICD-10-CM | POA: Diagnosis not present

## 2022-11-05 LAB — POCT URINALYSIS DIP (CLINITEK)
Bilirubin, UA: NEGATIVE
Blood, UA: NEGATIVE
Glucose, UA: >=1000 mg/dL — AB
Leukocytes, UA: NEGATIVE
Nitrite, UA: NEGATIVE
POC PROTEIN,UA: NEGATIVE
Spec Grav, UA: 1.02 (ref 1.010–1.025)
Urobilinogen, UA: 0.2 E.U./dL
pH, UA: 5.5 (ref 5.0–8.0)

## 2022-11-05 MED ORDER — CIPROFLOXACIN HCL 250 MG PO TABS: 250.0000 mg | ORAL_TABLET | Freq: Two times a day (BID) | ORAL | 0 refills | Status: AC

## 2022-11-05 NOTE — Progress Notes (Signed)
Acute Office Visit  Subjective:    Patient ID: Jeffrey Parsons, male    DOB: 09-22-1950, 72 y.o.   MRN: [id number]  Chief Complaint  Patient presents with   Dysuria    HPI Patient is in today for dysuria.  He was seen at [facility] on May 3 and treated for UTI. He was treated with IV rocephin and placed on ceftin 500 mg twice daily for 10 day.    Past Medical History:  Diagnosis Date   AICD (automatic cardioverter/defibrillator) present    Allergy to sulfa drugs 12/04/2016   Complete heart block (HCC) 05/26/2017   Coronary artery disease involving native coronary artery of native heart without angina pectoris 09/12/2016   Ischemic cardiomyopathy 05/26/2017   Pneumonia 2019   Retinopathy     Past Surgical History:  Procedure Laterality Date   BI-VENTRICULAR IMPLANTABLE CARDIOVERTER DEFIBRILLATOR UPGRADE  06/25/2018   BIV UPGRADE N/A 06/25/2018   Procedure: BIV ICD UPGRADE;  Surgeon: Duke Salvia, MD;  Location: [facility] INVASIVE CV LAB;  Service: Cardiovascular;  Laterality: N/A;   CARDIAC CATHETERIZATION  2001; ~ 2013   CATARACT EXTRACTION W/ INTRAOCULAR LENS  IMPLANT, BILATERAL Bilateral    CORONARY ARTERY BYPASS GRAFT  2001   "CABG X5"   INSERT / REPLACE / REMOVE PACEMAKER  05/2010   KNEE SURGERY Left    AS A CHILD   R shoulder surgery  05/2020   RIGHT/LEFT HEART CATH AND CORONARY/GRAFT ANGIOGRAPHY N/A 06/16/2017   Procedure: RIGHT/LEFT HEART CATH AND CORONARY/GRAFT ANGIOGRAPHY;  Surgeon: Tonny Bollman, MD;  Location: [facility] INVASIVE CV LAB;  Service: Cardiovascular;  Laterality: N/A;   TONSILLECTOMY      Family History  Problem Relation Age of Onset   Bronchitis Mother    Coronary artery disease Father    Heart disease Father    Hypertension Sister    Hypertension Brother     Social History   Socioeconomic History   Marital status: Married    Spouse name: Not on file   Number of children: Not on file   Years of education: Not on file   Highest education  level: Not on file  Occupational History   Not on file  Tobacco Use   Smoking status: Never   Smokeless tobacco: Never  Vaping Use   Vaping Use: Never used  Substance and Sexual Activity   Alcohol use: Never   Drug use: Never   Sexual activity: Yes  Other Topics Concern   Not on file  Social History Narrative   Not on file   Social Determinants of Health   Financial Resource Strain: High Risk (05/07/2022)   Overall Financial Resource Strain (CARDIA)    Difficulty of Paying Living Expenses: Hard  Food Insecurity: No Food Insecurity (01/26/2020)   Hunger Vital Sign    Worried About Running Out of Food in the Last Year: Never true    Ran Out of Food in the Last Year: Never true  Transportation Needs: No Transportation Needs (05/07/2022)   PRAPARE - Administrator, Civil Service (Medical): No    Lack of Transportation (Non-Medical): No  Physical Activity: Sufficiently Active (01/16/2022)   Exercise Vital Sign    Days of Exercise per Week: 5 days    Minutes of Exercise per Session: 50 min  Stress: No Stress Concern Present (08/18/2022)   [employer] of Occupational Health - Occupational Stress Questionnaire    Feeling of Stress : Not at all  Social Connections: Socially  Integrated (05/08/2020)   Social Connection and Isolation Panel [NHANES]    Frequency of Communication with Friends and Family: More than three times a week    Frequency of Social Gatherings with Friends and Family: Twice a week    Attends Religious Services: More than 4 times per year    Active Member of [employer] or Organizations: Yes    Attends Engineer, structural: More than 4 times per year    Marital Status: Married  Catering manager Violence: Not At Risk (08/18/2022)   Humiliation, Afraid, Rape, and Kick questionnaire    Fear of Current or Ex-Partner: No    Emotionally Abused: No    Physically Abused: No    Sexually Abused: No    Outpatient Medications Prior to Visit  Medication  Sig Dispense Refill   empagliflozin (JARDIANCE) 25 MG TABS tablet Take 1 tablet (25 mg total) by mouth daily before breakfast. 90 tablet 3   aspirin EC 81 MG tablet Take 1 tablet (81 mg total) by mouth daily.     carbidopa-levodopa (SINEMET CR) 50-200 MG tablet Take 1 tablet by mouth every evening.      carbidopa-levodopa (SINEMET IR) 25-250 MG tablet Take 1 tablet by mouth 5 (five) times daily.     Cyanocobalamin 1000 MCG CAPS Take 1 tablet by mouth daily.     ezetimibe (ZETIA) 10 MG tablet Take 1 tablet (10 mg total) by mouth daily. 90 tablet 3   insulin glargine (LANTUS) 100 UNIT/ML injection Inject 10 Units into the skin daily.     metFORMIN (GLUCOPHAGE) 1000 MG tablet Take 1 tablet (1,000 mg total) by mouth 2 (two) times daily with a meal. 180 tablet 1   metoprolol succinate (TOPROL XL) 25 MG 24 hr tablet Take 1 tablet (25 mg total) by mouth daily. 90 tablet 3   mirtazapine (REMERON) 15 MG tablet Take 7.5 mg by mouth at bedtime.     Multiple Vitamin (MULTIVITAMIN) tablet Take 1 tablet by mouth daily.     oxybutynin (DITROPAN) 5 MG tablet Take 5 mg by mouth daily.     rosuvastatin (CRESTOR) 10 MG tablet Take 1 tablet (10 mg total) by mouth every Monday, Wednesday, and Friday. 35 tablet 3   sacubitril-valsartan (ENTRESTO) 24-26 MG Take 1 tablet by mouth 2 (two) times daily. 180 tablet 3   tamsulosin (FLOMAX) 0.4 MG CAPS capsule SMARTSIG:1 Capsule(s) By Mouth Every Evening     No facility-administered medications prior to visit.    Allergies  Allergen Reactions   Farxiga [Dapagliflozin] Other (See Comments)    Patient lost weight.   Amoxicillin Nausea And Vomiting   Onion     Upset stomach   Sulfa Antibiotics Other (See Comments)    FLUSH     Review of Systems  Constitutional:  Positive for fatigue. Negative for chills and fever.  HENT:  Negative for congestion, rhinorrhea and sore throat.   Respiratory:  Negative for cough and shortness of breath.   Cardiovascular:  Negative  for chest pain and palpitations.  Gastrointestinal:  Negative for abdominal pain, constipation, diarrhea, nausea and vomiting.  Genitourinary:  Positive for dysuria. Negative for urgency.  Musculoskeletal:  Positive for back pain. Negative for arthralgias and myalgias.  Neurological:  Positive for weakness. Negative for dizziness and headaches.  Psychiatric/Behavioral:  Negative for dysphoric mood. The patient is not nervous/anxious.        Objective:        11/18/2022    1:25 PM 11/05/2022  11:11 AM 09/03/2022    2:54 PM  Vitals with BMI  Height 5' 5.5" 5' 5.5" 5' 6.5"  Weight 132 lbs 131 lbs 134 lbs  BMI 21.62 21.46 21.31  Systolic 128 136 161  Diastolic 72 60 58  Pulse 89 80 76    No data found.   Physical Exam Vitals reviewed.  Constitutional:      Comments: frail  Cardiovascular:     Rate and Rhythm: Normal rate and regular rhythm.     Heart sounds: Normal heart sounds.  Pulmonary:     Effort: Pulmonary effort is normal.     Breath sounds: Normal breath sounds. No wheezing, rhonchi or rales.  Abdominal:     General: Bowel sounds are normal.     Palpations: Abdomen is soft.     Tenderness: There is no abdominal tenderness.  Neurological:     Mental Status: He is alert.  Psychiatric:        Mood and Affect: Mood normal.        Behavior: Behavior normal.     Health Maintenance Due  Topic Date Due   Medicare Annual Wellness (AWV)  01/25/2020   DEXA SCAN  10/09/2022    There are no preventive care reminders to display for this patient.   Lab Results  Component Value Date   TSH 2.320 11/23/2020   Lab Results  Component Value Date   WBC 7.1 11/05/2022   HGB 13.1 11/05/2022   HCT 40.1 11/05/2022   MCV 97 11/05/2022   PLT 189 11/05/2022   Lab Results  Component Value Date   NA 142 11/13/2022   K 4.4 11/13/2022   CO2 22 11/13/2022   GLUCOSE 174 (H) 11/13/2022   BUN 21 11/13/2022   CREATININE 0.73 (L) 11/13/2022   BILITOT 0.6 11/13/2022   ALKPHOS  163 (H) 11/13/2022   AST 9 11/13/2022   ALT 4 11/13/2022   PROT 7.0 11/13/2022   ALBUMIN 4.4 11/13/2022   CALCIUM 9.7 11/13/2022   ANIONGAP 12 03/29/2020   EGFR 97 11/13/2022   Lab Results  Component Value Date   CHOL 156 11/13/2022   Lab Results  Component Value Date   HDL 47 11/13/2022   Lab Results  Component Value Date   LDLCALC 86 11/13/2022   Lab Results  Component Value Date   TRIG 133 11/13/2022   Lab Results  Component Value Date   CHOLHDL 3.3 11/13/2022   Lab Results  Component Value Date   HGBA1C 6.7 (H) 11/13/2022       Assessment & Plan:  Dysuria -     POCT URINALYSIS DIP (CLINITEK) -     Ciprofloxacin HCl; Take 1 tablet (250 mg total) by mouth 2 (two) times daily for 3 days.  Dispense: 6 tablet; Refill: 0  Weakness -     CBC with Differential/Platelet -     Comprehensive metabolic panel     Meds ordered this encounter  Medications   ciprofloxacin (CIPRO) 250 MG tablet    Sig: Take 1 tablet (250 mg total) by mouth 2 (two) times daily for 3 days.    Dispense:  6 tablet    Refill:  0    Orders Placed This Encounter  Procedures   CBC with Differential/Platelet   Comprehensive metabolic panel   POCT URINALYSIS DIP (CLINITEK)     Follow-up: No follow-ups on file.  An After Visit Summary was printed and given to the patient.  Blane Ohara, MD [facility] (  [phone] 

## 2022-11-06 DIAGNOSIS — L89510 Pressure ulcer of right ankle, unstageable: Secondary | ICD-10-CM | POA: Diagnosis not present

## 2022-11-06 DIAGNOSIS — L89150 Pressure ulcer of sacral region, unstageable: Secondary | ICD-10-CM | POA: Diagnosis not present

## 2022-11-06 DIAGNOSIS — E11319 Type 2 diabetes mellitus with unspecified diabetic retinopathy without macular edema: Secondary | ICD-10-CM | POA: Diagnosis not present

## 2022-11-06 DIAGNOSIS — I110 Hypertensive heart disease with heart failure: Secondary | ICD-10-CM | POA: Diagnosis not present

## 2022-11-06 DIAGNOSIS — E46 Unspecified protein-calorie malnutrition: Secondary | ICD-10-CM | POA: Diagnosis not present

## 2022-11-06 DIAGNOSIS — I5022 Chronic systolic (congestive) heart failure: Secondary | ICD-10-CM | POA: Diagnosis not present

## 2022-11-06 LAB — COMPREHENSIVE METABOLIC PANEL
ALT: 5 IU/L (ref 0–44)
AST: 13 IU/L (ref 0–40)
Albumin/Globulin Ratio: 1.7 (ref 1.2–2.2)
Albumin: 4.1 g/dL (ref 3.8–4.8)
Alkaline Phosphatase: 192 IU/L — ABNORMAL HIGH (ref 44–121)
BUN/Creatinine Ratio: 32 — ABNORMAL HIGH (ref 10–24)
BUN: 23 mg/dL (ref 8–27)
Bilirubin Total: 0.5 mg/dL (ref 0.0–1.2)
CO2: 21 mmol/L (ref 20–29)
Calcium: 9.1 mg/dL (ref 8.6–10.2)
Chloride: 100 mmol/L (ref 96–106)
Creatinine, Ser: 0.71 mg/dL — ABNORMAL LOW (ref 0.76–1.27)
Globulin, Total: 2.4 g/dL (ref 1.5–4.5)
Glucose: 238 mg/dL — ABNORMAL HIGH (ref 70–99)
Potassium: 5.2 mmol/L (ref 3.5–5.2)
Sodium: 140 mmol/L (ref 134–144)
Total Protein: 6.5 g/dL (ref 6.0–8.5)
eGFR: 97 mL/min/{1.73_m2} (ref >59)

## 2022-11-06 LAB — CBC WITH DIFFERENTIAL/PLATELET
Basophils Absolute: 0.1 10*3/uL (ref 0.0–0.2)
Basos: 1 %
EOS (ABSOLUTE): 0.4 10*3/uL (ref 0.0–0.4)
Eos: 6 %
Hematocrit: 40.1 % (ref 37.5–51.0)
Hemoglobin: 13.1 g/dL (ref 13.0–17.7)
Immature Grans (Abs): 0.1 10*3/uL (ref 0.0–0.1)
Immature Granulocytes: 1 %
Lymphocytes Absolute: 1.6 10*3/uL (ref 0.7–3.1)
Lymphs: 22 %
MCH: 31.7 pg (ref 26.6–33.0)
MCHC: 32.7 g/dL (ref 31.5–35.7)
MCV: 97 fL (ref 79–97)
Monocytes Absolute: 0.3 10*3/uL (ref 0.1–0.9)
Monocytes: 5 %
Neutrophils Absolute: 4.7 10*3/uL (ref 1.4–7.0)
Neutrophils: 65 %
Platelets: 189 10*3/uL (ref 150–450)
RBC: 4.13 x10E6/uL — ABNORMAL LOW (ref 4.14–5.80)
RDW: 11.9 % (ref 11.6–15.4)
WBC: 7.1 10*3/uL (ref 3.4–10.8)

## 2022-11-07 DIAGNOSIS — L89150 Pressure ulcer of sacral region, unstageable: Secondary | ICD-10-CM | POA: Diagnosis not present

## 2022-11-07 DIAGNOSIS — I5022 Chronic systolic (congestive) heart failure: Secondary | ICD-10-CM | POA: Diagnosis not present

## 2022-11-07 DIAGNOSIS — E46 Unspecified protein-calorie malnutrition: Secondary | ICD-10-CM | POA: Diagnosis not present

## 2022-11-07 DIAGNOSIS — E11319 Type 2 diabetes mellitus with unspecified diabetic retinopathy without macular edema: Secondary | ICD-10-CM | POA: Diagnosis not present

## 2022-11-07 DIAGNOSIS — I110 Hypertensive heart disease with heart failure: Secondary | ICD-10-CM | POA: Diagnosis not present

## 2022-11-07 DIAGNOSIS — L89510 Pressure ulcer of right ankle, unstageable: Secondary | ICD-10-CM | POA: Diagnosis not present

## 2022-11-09 ENCOUNTER — Other Ambulatory Visit: Payer: Self-pay

## 2022-11-09 DIAGNOSIS — I152 Hypertension secondary to endocrine disorders: Secondary | ICD-10-CM

## 2022-11-09 DIAGNOSIS — E782 Mixed hyperlipidemia: Secondary | ICD-10-CM

## 2022-11-10 ENCOUNTER — Telehealth: Payer: Self-pay

## 2022-11-10 DIAGNOSIS — L89150 Pressure ulcer of sacral region, unstageable: Secondary | ICD-10-CM | POA: Diagnosis not present

## 2022-11-10 DIAGNOSIS — E11319 Type 2 diabetes mellitus with unspecified diabetic retinopathy without macular edema: Secondary | ICD-10-CM | POA: Diagnosis not present

## 2022-11-10 DIAGNOSIS — L110 Acquired keratosis follicularis: Secondary | ICD-10-CM | POA: Diagnosis not present

## 2022-11-10 DIAGNOSIS — I110 Hypertensive heart disease with heart failure: Secondary | ICD-10-CM | POA: Diagnosis not present

## 2022-11-10 DIAGNOSIS — I5022 Chronic systolic (congestive) heart failure: Secondary | ICD-10-CM | POA: Diagnosis not present

## 2022-11-10 DIAGNOSIS — L89510 Pressure ulcer of right ankle, unstageable: Secondary | ICD-10-CM | POA: Diagnosis not present

## 2022-11-10 DIAGNOSIS — E46 Unspecified protein-calorie malnutrition: Secondary | ICD-10-CM | POA: Diagnosis not present

## 2022-11-10 NOTE — Telephone Encounter (Signed)
[facility] HEALTH HOME HEALTH POC/ORDERS FOR 10/20/2022-12/18/2022

## 2022-11-11 ENCOUNTER — Ambulatory Visit (INDEPENDENT_AMBULATORY_CARE_PROVIDER_SITE_OTHER): Payer: Medicare Other | Admitting: Podiatry

## 2022-11-11 DIAGNOSIS — E11319 Type 2 diabetes mellitus with unspecified diabetic retinopathy without macular edema: Secondary | ICD-10-CM | POA: Diagnosis not present

## 2022-11-11 DIAGNOSIS — L89510 Pressure ulcer of right ankle, unstageable: Secondary | ICD-10-CM | POA: Diagnosis not present

## 2022-11-11 DIAGNOSIS — I5022 Chronic systolic (congestive) heart failure: Secondary | ICD-10-CM | POA: Diagnosis not present

## 2022-11-11 DIAGNOSIS — L97311 Non-pressure chronic ulcer of right ankle limited to breakdown of skin: Secondary | ICD-10-CM

## 2022-11-11 DIAGNOSIS — E46 Unspecified protein-calorie malnutrition: Secondary | ICD-10-CM | POA: Diagnosis not present

## 2022-11-11 DIAGNOSIS — I110 Hypertensive heart disease with heart failure: Secondary | ICD-10-CM | POA: Diagnosis not present

## 2022-11-11 DIAGNOSIS — L89150 Pressure ulcer of sacral region, unstageable: Secondary | ICD-10-CM | POA: Diagnosis not present

## 2022-11-11 MED ORDER — CLINDAMYCIN HCL 300 MG PO CAPS: 300.0000 mg | ORAL_CAPSULE | Freq: Three times a day (TID) | ORAL | 0 refills | Status: DC

## 2022-11-11 NOTE — Progress Notes (Signed)
Subjective:  Patient ID: Jeffrey Parsons, male    DOB: 01/17/1951,  MRN: [id number]  Chief Complaint  Patient presents with   ankle wound     right ankle wound on ankle bone has gotten larger - minimal drainage, red around wound; size 0.5 x 0.5 x 0.1 - URGENT WORK-IN DIABETIC    72 y.o. male presents with concern for possible infection in the right lateral ankle ulceration.  Patient is previous seen Dr. Ralene Cork for this ulceration.  There is concerned that it may be breaking down and getting worse earlier today.  Has been seen by home health care nurse for dressing care to the right lateral ankle.  Been putting Xeroform and absorbent dressing with padding over the lateral malleolus which is where the wound is.  Denies nausea vomiting fever chills.  Past Medical History:  Diagnosis Date   AICD (automatic cardioverter/defibrillator) present    Allergy to sulfa drugs 12/04/2016   Complete heart block (HCC) 05/26/2017   Coronary artery disease involving native coronary artery of native heart without angina pectoris 09/12/2016   Ischemic cardiomyopathy 05/26/2017   Pneumonia 2019   Retinopathy     Allergies  Allergen Reactions   Farxiga [Dapagliflozin] Other (See Comments)    Patient lost weight.   Amoxicillin Nausea And Vomiting   Onion     Upset stomach   Sulfa Antibiotics Other (See Comments)    FLUSH     ROS: Negative except as per HPI above  Objective:  General: AAO x3, NAD  Dermatological: Pinpoint duration present the lateral malleolus of the right ankle.  There is healthy granular base there is mild minimal to no erythema mild inflammation surrounding however.  There is no maceration no drainage present no deep sinus tract.  No bone exposed wound probes to subcutaneous fat level approximately 2 cm in diameter   Vascular:  Dorsalis Pedis artery and Posterior Tibial artery pedal pulses are 1/4 bilateral.  Capillary fill time < 3 sec to all digits.   Neruologic: Grossly  diminished via light touch Musculoskeletal: No gross boney pedal deformities bilateral. No pain, crepitus, or limitation noted with foot and ankle range of motion bilateral. Muscular strength 5/5 in all groups tested bilateral.  Gait: Unassisted, Nonantalgic.   No images are attached to the encounter.  Radiographs:  Deferred Assessment:   1. Ulcer of right ankle, limited to breakdown of skin [facility])     Plan:  Patient was evaluated and treated and all questions answered.  Ulcer right lateral ankle -Discussed with patient there is not any evidence clinically of infection however we will send clindamycin 300 mg 3 times daily for the next 7 days as a prophylaxis against worsening of the wound -Continue with wound care per home health nurse overall the wound appears healthy and very small at this time -We discussed the etiology and factors that are a part of the wound healing process.  We also discussed the risk of infection both soft tissue and osteomyelitis from open ulceration.  Discussed the risk of limb loss if this happens or worsens. -Debridement as below. -Dressed with dressing per home health care -Continue home dressing changes  3 times weekly  -Continue off-loading with felt padding. -Vascular testing deferred -Last antibiotics: 300 mg clindamycin 3 times daily for 7 days -Imaging: Deferred   Return in about 3 weeks (around 12/02/2022) for F/u R lateral ankle ulcer.          Corinna Gab, DPM [facility] / [facility]

## 2022-11-12 DIAGNOSIS — I5022 Chronic systolic (congestive) heart failure: Secondary | ICD-10-CM | POA: Diagnosis not present

## 2022-11-12 DIAGNOSIS — E46 Unspecified protein-calorie malnutrition: Secondary | ICD-10-CM | POA: Diagnosis not present

## 2022-11-12 DIAGNOSIS — E11319 Type 2 diabetes mellitus with unspecified diabetic retinopathy without macular edema: Secondary | ICD-10-CM | POA: Diagnosis not present

## 2022-11-12 DIAGNOSIS — I110 Hypertensive heart disease with heart failure: Secondary | ICD-10-CM | POA: Diagnosis not present

## 2022-11-12 DIAGNOSIS — L89150 Pressure ulcer of sacral region, unstageable: Secondary | ICD-10-CM | POA: Diagnosis not present

## 2022-11-12 DIAGNOSIS — L89510 Pressure ulcer of right ankle, unstageable: Secondary | ICD-10-CM | POA: Diagnosis not present

## 2022-11-13 ENCOUNTER — Other Ambulatory Visit: Payer: Medicare Other

## 2022-11-13 DIAGNOSIS — E782 Mixed hyperlipidemia: Secondary | ICD-10-CM | POA: Diagnosis not present

## 2022-11-13 DIAGNOSIS — L89510 Pressure ulcer of right ankle, unstageable: Secondary | ICD-10-CM | POA: Diagnosis not present

## 2022-11-13 DIAGNOSIS — E46 Unspecified protein-calorie malnutrition: Secondary | ICD-10-CM | POA: Diagnosis not present

## 2022-11-13 DIAGNOSIS — E669 Obesity, unspecified: Secondary | ICD-10-CM

## 2022-11-13 DIAGNOSIS — I110 Hypertensive heart disease with heart failure: Secondary | ICD-10-CM | POA: Diagnosis not present

## 2022-11-13 DIAGNOSIS — I5022 Chronic systolic (congestive) heart failure: Secondary | ICD-10-CM | POA: Diagnosis not present

## 2022-11-13 DIAGNOSIS — E1169 Type 2 diabetes mellitus with other specified complication: Secondary | ICD-10-CM | POA: Diagnosis not present

## 2022-11-13 DIAGNOSIS — I152 Hypertension secondary to endocrine disorders: Secondary | ICD-10-CM | POA: Diagnosis not present

## 2022-11-13 DIAGNOSIS — E11319 Type 2 diabetes mellitus with unspecified diabetic retinopathy without macular edema: Secondary | ICD-10-CM | POA: Diagnosis not present

## 2022-11-13 DIAGNOSIS — L89150 Pressure ulcer of sacral region, unstageable: Secondary | ICD-10-CM | POA: Diagnosis not present

## 2022-11-13 DIAGNOSIS — E1159 Type 2 diabetes mellitus with other circulatory complications: Secondary | ICD-10-CM | POA: Diagnosis not present

## 2022-11-13 LAB — COMPREHENSIVE METABOLIC PANEL
ALT: 4 IU/L (ref 0–44)
AST: 9 IU/L (ref 0–40)
Albumin/Globulin Ratio: 1.7 (ref 1.2–2.2)
Albumin: 4.4 g/dL (ref 3.8–4.8)
Alkaline Phosphatase: 163 IU/L — ABNORMAL HIGH (ref 44–121)
BUN/Creatinine Ratio: 29 — ABNORMAL HIGH (ref 10–24)
BUN: 21 mg/dL (ref 8–27)
Bilirubin Total: 0.6 mg/dL (ref 0.0–1.2)
CO2: 22 mmol/L (ref 20–29)
Calcium: 9.7 mg/dL (ref 8.6–10.2)
Chloride: 100 mmol/L (ref 96–106)
Creatinine, Ser: 0.73 mg/dL — ABNORMAL LOW (ref 0.76–1.27)
Globulin, Total: 2.6 g/dL (ref 1.5–4.5)
Glucose: 174 mg/dL — ABNORMAL HIGH (ref 70–99)
Potassium: 4.4 mmol/L (ref 3.5–5.2)
Sodium: 142 mmol/L (ref 134–144)
Total Protein: 7 g/dL (ref 6.0–8.5)
eGFR: 97 mL/min/{1.73_m2} (ref >59)

## 2022-11-14 LAB — LIPID PANEL
Chol/HDL Ratio: 3.3 ratio (ref 0.0–5.0)
Cholesterol, Total: 156 mg/dL (ref 100–199)
HDL: 47 mg/dL (ref >39)
LDL Chol Calc (NIH): 86 mg/dL (ref 0–99)
Triglycerides: 133 mg/dL (ref 0–149)
VLDL Cholesterol Cal: 23 mg/dL (ref 5–40)

## 2022-11-14 LAB — HEMOGLOBIN A1C
Est. average glucose Bld gHb Est-mCnc: 146 mg/dL
Hgb A1c MFr Bld: 6.7 % — ABNORMAL HIGH (ref 4.8–5.6)

## 2022-11-14 LAB — CARDIOVASCULAR RISK ASSESSMENT

## 2022-11-14 NOTE — Progress Notes (Signed)
Remote ICD transmission.   

## 2022-11-17 DIAGNOSIS — E46 Unspecified protein-calorie malnutrition: Secondary | ICD-10-CM | POA: Diagnosis not present

## 2022-11-17 DIAGNOSIS — L89510 Pressure ulcer of right ankle, unstageable: Secondary | ICD-10-CM | POA: Diagnosis not present

## 2022-11-17 DIAGNOSIS — E11319 Type 2 diabetes mellitus with unspecified diabetic retinopathy without macular edema: Secondary | ICD-10-CM | POA: Diagnosis not present

## 2022-11-17 DIAGNOSIS — I5022 Chronic systolic (congestive) heart failure: Secondary | ICD-10-CM | POA: Diagnosis not present

## 2022-11-17 DIAGNOSIS — I110 Hypertensive heart disease with heart failure: Secondary | ICD-10-CM | POA: Diagnosis not present

## 2022-11-17 DIAGNOSIS — L89150 Pressure ulcer of sacral region, unstageable: Secondary | ICD-10-CM | POA: Diagnosis not present

## 2022-11-18 ENCOUNTER — Encounter: Payer: Self-pay | Admitting: Family Medicine

## 2022-11-18 ENCOUNTER — Ambulatory Visit (INDEPENDENT_AMBULATORY_CARE_PROVIDER_SITE_OTHER): Payer: Medicare Other | Admitting: Family Medicine

## 2022-11-18 VITALS — BP 128/72 | HR 89 | Temp 95.9°F | Ht 65.5 in | Wt 132.0 lb

## 2022-11-18 DIAGNOSIS — I152 Hypertension secondary to endocrine disorders: Secondary | ICD-10-CM

## 2022-11-18 DIAGNOSIS — E782 Mixed hyperlipidemia: Secondary | ICD-10-CM

## 2022-11-18 DIAGNOSIS — E113293 Type 2 diabetes mellitus with mild nonproliferative diabetic retinopathy without macular edema, bilateral: Secondary | ICD-10-CM

## 2022-11-18 DIAGNOSIS — I110 Hypertensive heart disease with heart failure: Secondary | ICD-10-CM | POA: Diagnosis not present

## 2022-11-18 DIAGNOSIS — Z950 Presence of cardiac pacemaker: Secondary | ICD-10-CM

## 2022-11-18 DIAGNOSIS — I10 Essential (primary) hypertension: Secondary | ICD-10-CM

## 2022-11-18 DIAGNOSIS — G20B2 Parkinson's disease with dyskinesia, with fluctuations: Secondary | ICD-10-CM

## 2022-11-18 DIAGNOSIS — L89150 Pressure ulcer of sacral region, unstageable: Secondary | ICD-10-CM | POA: Diagnosis not present

## 2022-11-18 DIAGNOSIS — I5022 Chronic systolic (congestive) heart failure: Secondary | ICD-10-CM | POA: Diagnosis not present

## 2022-11-18 DIAGNOSIS — M810 Age-related osteoporosis without current pathological fracture: Secondary | ICD-10-CM

## 2022-11-18 DIAGNOSIS — E11319 Type 2 diabetes mellitus with unspecified diabetic retinopathy without macular edema: Secondary | ICD-10-CM | POA: Diagnosis not present

## 2022-11-18 DIAGNOSIS — I2510 Atherosclerotic heart disease of native coronary artery without angina pectoris: Secondary | ICD-10-CM

## 2022-11-18 DIAGNOSIS — E1169 Type 2 diabetes mellitus with other specified complication: Secondary | ICD-10-CM

## 2022-11-18 DIAGNOSIS — I5042 Chronic combined systolic (congestive) and diastolic (congestive) heart failure: Secondary | ICD-10-CM | POA: Diagnosis not present

## 2022-11-18 DIAGNOSIS — Z794 Long term (current) use of insulin: Secondary | ICD-10-CM | POA: Diagnosis not present

## 2022-11-18 DIAGNOSIS — E46 Unspecified protein-calorie malnutrition: Secondary | ICD-10-CM | POA: Diagnosis not present

## 2022-11-18 DIAGNOSIS — L89510 Pressure ulcer of right ankle, unstageable: Secondary | ICD-10-CM | POA: Diagnosis not present

## 2022-11-18 NOTE — Patient Instructions (Signed)
Take 1200 mg of calcium daily

## 2022-11-18 NOTE — Progress Notes (Signed)
Subjective:  Patient ID: Jeffrey Parsons, male    DOB: Aug 26, 1950  Age: 72 y.o. MRN: [id number]  Chief Complaint  Patient presents with   Medical Management of Chronic Issues    HPI Diabetes: Patient  is taking Lantus 10 units daily, Jardiance 10 mg daily, Metformin 1000 mg  twice a day. He checks his blood sugar every day.  Average is 168, Last A1C was 6.7%. He had his eye exam done. He is on diabetic diet and exercising. Patient is using [employer]. Checks feet daily   Hyperlipidemia: Taking Rosuvastatin 10 mg daily and zetia 10 mg daily .He is on a low fat diet. Trigs 133. LDL 23. HDL 47.    CHF: Takes Entresto 24-26 mg twice a day, Jardiance 10 mg daily. No DOE, no edema. ASA 81 qd. See's Dr. Dulce Sellar and Dr. Graciela Husbands for Pacemaker   Parkisonism: Carbidopa-Levodopa CR 50- 200 mg one before bed and 25-250 mg 5 times a day and mirtazapine 15 mg for sleep. See's Dr. Nadara Mode.     11/05/2022   11:22 AM 08/18/2022    2:15 PM 06/17/2021    8:59 AM 01/17/2020    8:30 AM  Depression screen PHQ 2/9  Decreased Interest 0 0 0 0  Down, Depressed, Hopeless 0 0 0 0  PHQ - 2 Score 0 0 0 0  Altered sleeping 0   0  Tired, decreased energy 0   1  Change in appetite 0   0  Feeling bad or failure about yourself  0   0  Trouble concentrating 0   0  Moving slowly or fidgety/restless 0   1  Suicidal thoughts 0   0  PHQ-9 Score 0   2  Difficult doing work/chores Not difficult at all   Not difficult at all        11/05/2022   11:19 AM  Fall Risk   Falls in the past year? 1  Number falls in past yr: 1  Injury with Fall? 1  Risk for fall due to : History of fall(s)  Follow up Falls evaluation completed;Falls prevention discussed    Patient Care Team: Blane Ohara, MD as PCP - General (Family Medicine) Duke Salvia, MD as PCP - Electrophysiology (Cardiology) Baldo Daub, MD as PCP - Cardiology (Cardiology) Zettie Pho, [facility] (Pharmacist) Morene Rankins, MD as Referring Physician  (Neurology)   Review of Systems  Constitutional:  Negative for chills, diaphoresis, fatigue and fever.  HENT:  Negative for congestion, ear pain and sore throat.   Respiratory:  Negative for cough and shortness of breath.   Cardiovascular:  Negative for chest pain and leg swelling.  Gastrointestinal:  Negative for abdominal pain, constipation, diarrhea, nausea and vomiting.  Genitourinary:  Negative for dysuria and urgency.  Musculoskeletal:  Negative for arthralgias and myalgias.  Neurological:  Negative for dizziness and headaches.  Psychiatric/Behavioral:  Negative for dysphoric mood.     Current Outpatient Medications on File Prior to Visit  Medication Sig Dispense Refill   calcium-vitamin D (OSCAL WITH D) 500-5 MG-MCG tablet Take 1 tablet by mouth.     empagliflozin (JARDIANCE) 25 MG TABS tablet Take 1 tablet (25 mg total) by mouth daily before breakfast. 90 tablet 3   aspirin EC 81 MG tablet Take 1 tablet (81 mg total) by mouth daily.     carbidopa-levodopa (SINEMET CR) 50-200 MG tablet Take 1 tablet by mouth every evening.      carbidopa-levodopa (SINEMET IR) 25-250 MG  tablet Take 1 tablet by mouth 5 (five) times daily.     Cyanocobalamin 1000 MCG CAPS Take 1 tablet by mouth daily.     ezetimibe (ZETIA) 10 MG tablet Take 1 tablet (10 mg total) by mouth daily. 90 tablet 3   insulin glargine (LANTUS) 100 UNIT/ML injection Inject 10 Units into the skin daily.     metFORMIN (GLUCOPHAGE) 1000 MG tablet Take 1 tablet (1,000 mg total) by mouth 2 (two) times daily with a meal. 180 tablet 1   metoprolol succinate (TOPROL XL) 25 MG 24 hr tablet Take 1 tablet (25 mg total) by mouth daily. 90 tablet 3   mirtazapine (REMERON) 15 MG tablet Take 7.5 mg by mouth at bedtime.     Multiple Vitamin (MULTIVITAMIN) tablet Take 1 tablet by mouth daily.     oxybutynin (DITROPAN) 5 MG tablet Take 5 mg by mouth daily.     rosuvastatin (CRESTOR) 10 MG tablet Take 1 tablet (10 mg total) by mouth every  Monday, Wednesday, and Friday. 35 tablet 3   sacubitril-valsartan (ENTRESTO) 24-26 MG Take 1 tablet by mouth 2 (two) times daily. 180 tablet 3   tamsulosin (FLOMAX) 0.4 MG CAPS capsule SMARTSIG:1 Capsule(s) By Mouth Every Evening     No current facility-administered medications on file prior to visit.   Past Medical History:  Diagnosis Date   AICD (automatic cardioverter/defibrillator) present    Allergy to sulfa drugs 12/04/2016   Complete heart block (HCC) 05/26/2017   Coronary artery disease involving native coronary artery of native heart without angina pectoris 09/12/2016   Ischemic cardiomyopathy 05/26/2017   Pneumonia 2019   Retinopathy    Past Surgical History:  Procedure Laterality Date   BI-VENTRICULAR IMPLANTABLE CARDIOVERTER DEFIBRILLATOR UPGRADE  06/25/2018   BIV UPGRADE N/A 06/25/2018   Procedure: BIV ICD UPGRADE;  Surgeon: Duke Salvia, MD;  Location: [facility] INVASIVE CV LAB;  Service: Cardiovascular;  Laterality: N/A;   CARDIAC CATHETERIZATION  2001; ~ 2013   CATARACT EXTRACTION W/ INTRAOCULAR LENS  IMPLANT, BILATERAL Bilateral    CORONARY ARTERY BYPASS GRAFT  2001   "CABG X5"   INSERT / REPLACE / REMOVE PACEMAKER  05/2010   KNEE SURGERY Left    AS A CHILD   R shoulder surgery  05/2020   RIGHT/LEFT HEART CATH AND CORONARY/GRAFT ANGIOGRAPHY N/A 06/16/2017   Procedure: RIGHT/LEFT HEART CATH AND CORONARY/GRAFT ANGIOGRAPHY;  Surgeon: Tonny Bollman, MD;  Location: [facility] INVASIVE CV LAB;  Service: Cardiovascular;  Laterality: N/A;   TONSILLECTOMY      Family History  Problem Relation Age of Onset   Bronchitis Mother    Coronary artery disease Father    Heart disease Father    Hypertension Sister    Hypertension Brother    Social History   Socioeconomic History   Marital status: Married    Spouse name: Not on file   Number of children: Not on file   Years of education: Not on file   Highest education level: Not on file  Occupational History   Not on file  Tobacco  Use   Smoking status: Never   Smokeless tobacco: Never  Vaping Use   Vaping Use: Never used  Substance and Sexual Activity   Alcohol use: Never   Drug use: Never   Sexual activity: Yes  Other Topics Concern   Not on file  Social History Narrative   Not on file   Social Determinants of Health   Financial Resource Strain: High Risk (05/07/2022)  Overall Financial Resource Strain (CARDIA)    Difficulty of Paying Living Expenses: Hard  Food Insecurity: No Food Insecurity (01/26/2020)   Hunger Vital Sign    Worried About Running Out of Food in the Last Year: Never true    Ran Out of Food in the Last Year: Never true  Transportation Needs: No Transportation Needs (05/07/2022)   PRAPARE - Administrator, Civil Service (Medical): No    Lack of Transportation (Non-Medical): No  Physical Activity: Sufficiently Active (01/16/2022)   Exercise Vital Sign    Days of Exercise per Week: 5 days    Minutes of Exercise per Session: 50 min  Stress: No Stress Concern Present (08/18/2022)   [employer] of Occupational Health - Occupational Stress Questionnaire    Feeling of Stress : Not at all  Social Connections: Socially Integrated (05/08/2020)   Social Connection and Isolation Panel [NHANES]    Frequency of Communication with Friends and Family: More than three times a week    Frequency of Social Gatherings with Friends and Family: Twice a week    Attends Religious Services: More than 4 times per year    Active Member of [employer] or Organizations: Yes    Attends Engineer, structural: More than 4 times per year    Marital Status: Married    Objective:  BP 128/72   Pulse 89   Temp (!) 95.9 F (35.5 C)   Ht 5' 5.5" (1.664 m)   Wt 132 lb (59.9 kg)   SpO2 95%   BMI 21.63 kg/m      11/18/2022    1:25 PM 11/05/2022   11:11 AM 09/03/2022    2:54 PM  BP/Weight  Systolic BP 128 136 100  Diastolic BP 72 60 58  Wt. (Lbs) 132 131 134  BMI 21.63 kg/m2 21.47 kg/m2 21.3  kg/m2    Physical Exam Vitals reviewed.  Constitutional:      Comments: frail  Neck:     Vascular: No carotid bruit.  Cardiovascular:     Rate and Rhythm: Normal rate and regular rhythm.     Heart sounds: Normal heart sounds. No murmur heard. Pulmonary:     Effort: Pulmonary effort is normal.     Breath sounds: Normal breath sounds.  Abdominal:     General: Abdomen is flat. Bowel sounds are normal.     Palpations: Abdomen is soft.     Tenderness: There is no abdominal tenderness.  Neurological:     Mental Status: He is alert.     Gait: Gait abnormal (using a walker. Shuffling gait.).     Comments: Tremor of hands. Pill rolling. Head tremor.   Psychiatric:        Mood and Affect: Mood normal.        Behavior: Behavior normal.     Diabetic Foot Exam - Simple   Simple Foot Form  11/18/2022  9:39 PM  Visual Inspection No deformities, no ulcerations, no other skin breakdown bilaterally: Yes Sensation Testing Intact to touch and monofilament testing bilaterally: Yes Pulse Check Posterior Tibialis and Dorsalis pulse intact bilaterally: Yes Comments      Lab Results  Component Value Date   WBC 7.1 11/05/2022   HGB 13.1 11/05/2022   HCT 40.1 11/05/2022   PLT 189 11/05/2022   GLUCOSE 174 (H) 11/13/2022   CHOL 156 11/13/2022   TRIG 133 11/13/2022   HDL 47 11/13/2022   LDLCALC 86 11/13/2022   ALT 4 11/13/2022  AST 9 11/13/2022   NA 142 11/13/2022   K 4.4 11/13/2022   CL 100 11/13/2022   CREATININE 0.73 (L) 11/13/2022   BUN 21 11/13/2022   CO2 22 11/13/2022   TSH 2.320 11/23/2020   HGBA1C 6.7 (H) 11/13/2022   MICROALBUR 80 01/17/2020      Assessment & Plan:    Mixed hyperlipidemia Assessment & Plan: Well controlled.  No changes to medicines. Taking Rosuvastatin 10 mg daily and zetia 10 mg daily  Continue to work on eating a healthy diet and exercise.  Labs ordered today.     Parkinson's disease with dyskinesia and fluctuating manifestations Assessment  & Plan: The current medical regimen is effective although condition is progressing;  continue present plan and medications. Management per specialist.     Hypertensive heart disease with chronic combined systolic and diastolic congestive heart failure (HCC) Assessment & Plan: Well controlled.  Takes Entresto 24-26 mg twice a day, Jardiance 10 mg daily  No medication changes recommended. Continue healthy diet and exercise.    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 Recommend check sugars fasting daily. Recommend check feet daily. Recommend annual eye exams. Medicines: Continue lantus at 10 units daily. Continue metformin to 1000 mg twice daily. Continue Jardiance 10 mg daily. Continue to work on eating a healthy diet and exercise.  Labs reviewed   Orders: -     CBC with Differential/Platelet; Future -     Lipid panel; Future -     Hemoglobin A1c; Future  Age-related osteoporosis without current pathological fracture Assessment & Plan: Check labs.  Orders: -     DG Bone Density; Future -     VITAMIN D 25 Hydroxy (Vit-D Deficiency, Fractures); Future  Pacemaker Assessment & Plan: Management per specialist. Dr. Graciela Husbands.    Coronary artery disease involving native coronary artery of native heart without angina pectoris Assessment & Plan: S/P CABG.  CONTINUE CRESTOR, ZETIA, AND ASPIRIN.       No orders of the defined types were placed in this encounter.   Orders Placed This Encounter  Procedures   DG Bone Density   CBC with Differential/Platelet   Comprehensive metabolic panel   Lipid panel   Hemoglobin A1c   VITAMIN D 25 Hydroxy (Vit-D Deficiency, Fractures)     Follow-up: Return in about 3 months (around 02/18/2023) for chronic,  fasting.   I,Katherina A Bramblett,acting as a scribe for Blane Ohara, MD.,have documented all relevant documentation on the behalf of Blane Ohara, MD,as directed by  Blane Ohara, MD while in the presence of Blane Ohara, MD.   Clayborn Bigness I Leal-Borjas,acting as a scribe for Blane Ohara, MD.,have documented all relevant documentation on the behalf of Blane Ohara, MD,as directed by  Blane Ohara, MD while in the presence of Blane Ohara, MD.    An After Visit Summary was printed and given to the patient.  I attest that I have reviewed this visit and agree with the plan scribed by my staff.   Blane Ohara, MD [facility] [phone]

## 2022-11-18 NOTE — Assessment & Plan Note (Addendum)
The current medical regimen is effective although condition is progressing;  continue present plan and medications. Management per specialist.

## 2022-11-18 NOTE — Assessment & Plan Note (Signed)
Well controlled.  On entresto. No medication changes recommended. Continue healthy diet and exercise.   

## 2022-11-18 NOTE — Assessment & Plan Note (Addendum)
Control: improved. Recommend check sugars fasting daily. Recommend check feet daily. Recommend annual eye exams. Medicines: Continue lantus at 10 units daily. Continue metformin to 1000 mg twice daily. Continue Jardiance 10 mg daily. Continue to work on eating a healthy diet and exercise.  Labs reviewed

## 2022-11-19 DIAGNOSIS — Z556 Problems related to health literacy: Secondary | ICD-10-CM | POA: Diagnosis not present

## 2022-11-19 DIAGNOSIS — Z951 Presence of aortocoronary bypass graft: Secondary | ICD-10-CM | POA: Diagnosis not present

## 2022-11-19 DIAGNOSIS — Z7982 Long term (current) use of aspirin: Secondary | ICD-10-CM | POA: Diagnosis not present

## 2022-11-19 DIAGNOSIS — E1159 Type 2 diabetes mellitus with other circulatory complications: Secondary | ICD-10-CM | POA: Insufficient documentation

## 2022-11-19 DIAGNOSIS — M1990 Unspecified osteoarthritis, unspecified site: Secondary | ICD-10-CM | POA: Diagnosis not present

## 2022-11-19 DIAGNOSIS — N400 Enlarged prostate without lower urinary tract symptoms: Secondary | ICD-10-CM | POA: Diagnosis not present

## 2022-11-19 DIAGNOSIS — E782 Mixed hyperlipidemia: Secondary | ICD-10-CM | POA: Insufficient documentation

## 2022-11-19 DIAGNOSIS — E46 Unspecified protein-calorie malnutrition: Secondary | ICD-10-CM | POA: Diagnosis not present

## 2022-11-19 DIAGNOSIS — G8929 Other chronic pain: Secondary | ICD-10-CM | POA: Diagnosis not present

## 2022-11-19 DIAGNOSIS — Z7984 Long term (current) use of oral hypoglycemic drugs: Secondary | ICD-10-CM | POA: Diagnosis not present

## 2022-11-19 DIAGNOSIS — G20B2 Parkinson's disease with dyskinesia, with fluctuations: Secondary | ICD-10-CM | POA: Diagnosis not present

## 2022-11-19 DIAGNOSIS — E119 Type 2 diabetes mellitus without complications: Secondary | ICD-10-CM | POA: Insufficient documentation

## 2022-11-19 DIAGNOSIS — M519 Unspecified thoracic, thoracolumbar and lumbosacral intervertebral disc disorder: Secondary | ICD-10-CM | POA: Diagnosis not present

## 2022-11-19 DIAGNOSIS — I255 Ischemic cardiomyopathy: Secondary | ICD-10-CM | POA: Diagnosis not present

## 2022-11-19 DIAGNOSIS — I110 Hypertensive heart disease with heart failure: Secondary | ICD-10-CM | POA: Diagnosis not present

## 2022-11-19 DIAGNOSIS — L89510 Pressure ulcer of right ankle, unstageable: Secondary | ICD-10-CM | POA: Diagnosis not present

## 2022-11-19 DIAGNOSIS — I700 Atherosclerosis of aorta: Secondary | ICD-10-CM | POA: Diagnosis not present

## 2022-11-19 DIAGNOSIS — L89150 Pressure ulcer of sacral region, unstageable: Secondary | ICD-10-CM | POA: Diagnosis not present

## 2022-11-19 DIAGNOSIS — E860 Dehydration: Secondary | ICD-10-CM | POA: Diagnosis not present

## 2022-11-19 DIAGNOSIS — I2510 Atherosclerotic heart disease of native coronary artery without angina pectoris: Secondary | ICD-10-CM | POA: Diagnosis not present

## 2022-11-19 DIAGNOSIS — Z95810 Presence of automatic (implantable) cardiac defibrillator: Secondary | ICD-10-CM | POA: Diagnosis not present

## 2022-11-19 DIAGNOSIS — M810 Age-related osteoporosis without current pathological fracture: Secondary | ICD-10-CM | POA: Insufficient documentation

## 2022-11-19 DIAGNOSIS — I5042 Chronic combined systolic (congestive) and diastolic (congestive) heart failure: Secondary | ICD-10-CM | POA: Insufficient documentation

## 2022-11-19 DIAGNOSIS — I5022 Chronic systolic (congestive) heart failure: Secondary | ICD-10-CM | POA: Diagnosis not present

## 2022-11-19 DIAGNOSIS — Z794 Long term (current) use of insulin: Secondary | ICD-10-CM | POA: Diagnosis not present

## 2022-11-19 DIAGNOSIS — I442 Atrioventricular block, complete: Secondary | ICD-10-CM | POA: Diagnosis not present

## 2022-11-19 DIAGNOSIS — E785 Hyperlipidemia, unspecified: Secondary | ICD-10-CM | POA: Diagnosis not present

## 2022-11-19 DIAGNOSIS — E11319 Type 2 diabetes mellitus with unspecified diabetic retinopathy without macular edema: Secondary | ICD-10-CM | POA: Diagnosis not present

## 2022-11-19 DIAGNOSIS — E1169 Type 2 diabetes mellitus with other specified complication: Secondary | ICD-10-CM | POA: Diagnosis not present

## 2022-11-19 NOTE — Assessment & Plan Note (Signed)
Well controlled.  No changes to medicines. Taking Rosuvastatin 10 mg daily and zetia 10 mg daily  Continue to work on eating a healthy diet and exercise.  Labs ordered today.

## 2022-11-19 NOTE — Assessment & Plan Note (Signed)
Check labs 

## 2022-11-19 NOTE — Assessment & Plan Note (Signed)
Management per specialist. 

## 2022-11-19 NOTE — Assessment & Plan Note (Signed)
Control:  Recommend check sugars fasting daily. Recommend check feet daily. Recommend annual eye exams. Medicines: taking Lantus 10 units daily, Jardiance 10 mg daily, Metformin 500 mg  twice a day.  Continue to work on eating a healthy diet and exercise.  Labs ordered today.

## 2022-11-20 ENCOUNTER — Telehealth: Payer: Self-pay

## 2022-11-20 DIAGNOSIS — L89150 Pressure ulcer of sacral region, unstageable: Secondary | ICD-10-CM | POA: Diagnosis not present

## 2022-11-20 DIAGNOSIS — E46 Unspecified protein-calorie malnutrition: Secondary | ICD-10-CM | POA: Diagnosis not present

## 2022-11-20 DIAGNOSIS — I110 Hypertensive heart disease with heart failure: Secondary | ICD-10-CM | POA: Diagnosis not present

## 2022-11-20 DIAGNOSIS — I5022 Chronic systolic (congestive) heart failure: Secondary | ICD-10-CM | POA: Diagnosis not present

## 2022-11-20 DIAGNOSIS — L89510 Pressure ulcer of right ankle, unstageable: Secondary | ICD-10-CM | POA: Diagnosis not present

## 2022-11-20 DIAGNOSIS — E11319 Type 2 diabetes mellitus with unspecified diabetic retinopathy without macular edema: Secondary | ICD-10-CM | POA: Diagnosis not present

## 2022-11-20 NOTE — Telephone Encounter (Signed)
[facility] HEALTH HOME HEALTH ORDER DATE: 11/18/2022 TO 11/18/2022

## 2022-11-21 ENCOUNTER — Encounter: Payer: Self-pay | Admitting: Family Medicine

## 2022-11-21 DIAGNOSIS — L89510 Pressure ulcer of right ankle, unstageable: Secondary | ICD-10-CM | POA: Diagnosis not present

## 2022-11-21 DIAGNOSIS — E11319 Type 2 diabetes mellitus with unspecified diabetic retinopathy without macular edema: Secondary | ICD-10-CM | POA: Diagnosis not present

## 2022-11-21 DIAGNOSIS — I110 Hypertensive heart disease with heart failure: Secondary | ICD-10-CM | POA: Diagnosis not present

## 2022-11-21 DIAGNOSIS — L89150 Pressure ulcer of sacral region, unstageable: Secondary | ICD-10-CM | POA: Diagnosis not present

## 2022-11-21 DIAGNOSIS — E46 Unspecified protein-calorie malnutrition: Secondary | ICD-10-CM | POA: Diagnosis not present

## 2022-11-21 DIAGNOSIS — I5022 Chronic systolic (congestive) heart failure: Secondary | ICD-10-CM | POA: Diagnosis not present

## 2022-11-23 NOTE — Assessment & Plan Note (Signed)
Management per specialist. Dr. Graciela Husbands.

## 2022-11-23 NOTE — Assessment & Plan Note (Signed)
S/P CABG.  CONTINUE CRESTOR, ZETIA, AND ASPIRIN.

## 2022-11-25 ENCOUNTER — Telehealth: Payer: Self-pay

## 2022-11-25 ENCOUNTER — Other Ambulatory Visit: Payer: Self-pay | Admitting: Family Medicine

## 2022-11-25 ENCOUNTER — Ambulatory Visit (INDEPENDENT_AMBULATORY_CARE_PROVIDER_SITE_OTHER): Payer: Medicare Other

## 2022-11-25 DIAGNOSIS — R300 Dysuria: Secondary | ICD-10-CM

## 2022-11-25 DIAGNOSIS — N3000 Acute cystitis without hematuria: Secondary | ICD-10-CM | POA: Diagnosis not present

## 2022-11-25 LAB — POCT URINALYSIS DIPSTICK
Bilirubin, UA: NEGATIVE
Blood, UA: NEGATIVE
Glucose, UA: POSITIVE — AB
Ketones, UA: NEGATIVE
Nitrite, UA: NEGATIVE
Protein, UA: NEGATIVE
Spec Grav, UA: 1.015 (ref 1.010–1.025)
Urobilinogen, UA: NEGATIVE E.U./dL — AB
pH, UA: 6 (ref 5.0–8.0)

## 2022-11-25 MED ORDER — CIPROFLOXACIN HCL 250 MG PO TABS: 250.0000 mg | ORAL_TABLET | Freq: Two times a day (BID) | ORAL | 0 refills | Status: AC

## 2022-11-25 NOTE — Progress Notes (Signed)
Patient came in for urinalysis recheck per provider due to on- going uti symptoms. Urine was positive for WBCS, and urine culture was sent and provider treated patient with Cipro and told the patient's wife we would call when we received patient's results back from the Urine Culture.

## 2022-11-25 NOTE — Progress Notes (Signed)
Care Management & Coordination Services Pharmacy Team  Reason for Encounter: Diabetes  Contacted patient to discuss diabetes disease state. Spoke with family on 11/27/2022   Current antihyperglycemic regimen:  Jardiance 25 mg daily Metformin 1000 mg twice daily Lantus 10-14 units in AM    Patient verbally confirms he is taking the above medications as directed. Yes  What diet changes have been made to improve diabetes control? Patient's wife states she monitors his carb intake.  What recent interventions/DTPs have been made to improve glycemic control:  None  Have there been any recent hospitalizations or ED visits since last visit with PharmD? No  Patient denies hypoglycemic symptoms  Patient denies hyperglycemic symptoms  How often are you checking your blood sugar? 3-4 times daily  What are your blood sugars ranging?  Fasting: 05-30 128, 05-29 80, 5-28 121, 05-27 87, 05-26 70 Before meals: None After meals: 05-30 144, 05-29 132, 05-28 145, 05-27 177, 05-26 147 Bedtime: None  During the week, how often does your blood glucose drop below 70? Never Patient's wife stated his sugar dropped to 54 a few  weeks ago when he was sleep but it came right back up to normal after drinking something.  Are you checking your feet daily/regularly? Yes  Adherence Review: Is the patient currently on a STATIN medication? Yes Is the patient currently on ACE/ARB medication? Yes Does the patient have >5 day gap between last estimated fill dates? No   Chart Updates: Recent office visits:  11-18-2022 Blane Ohara, MD. Visit for Medical Management of Chronic Issues. Completed clindamycin.  11-05-2022 Blane Ohara, MD. Visit for dysuria. Start Cipro 250 mg twice daily  Recent consult visits:  11-11-2022 Pilar Plate, DPM (Podiatry). Follow up visit for wound on ankle. Start clindamycin 300 mg 3 times daily.  Hospital visits:  None in previous 6 months  Medications: Outpatient  Encounter Medications as of 11/25/2022  Medication Sig   empagliflozin (JARDIANCE) 25 MG TABS tablet Take 1 tablet (25 mg total) by mouth daily before breakfast.   aspirin EC 81 MG tablet Take 1 tablet (81 mg total) by mouth daily.   calcium-vitamin D (OSCAL WITH D) 500-5 MG-MCG tablet Take 1 tablet by mouth.   carbidopa-levodopa (SINEMET CR) 50-200 MG tablet Take 1 tablet by mouth every evening.    carbidopa-levodopa (SINEMET IR) 25-250 MG tablet Take 1 tablet by mouth 5 (five) times daily.   ciprofloxacin (CIPRO) 250 MG tablet Take 1 tablet (250 mg total) by mouth 2 (two) times daily for 5 days.   Cyanocobalamin 1000 MCG CAPS Take 1 tablet by mouth daily.   ezetimibe (ZETIA) 10 MG tablet Take 1 tablet (10 mg total) by mouth daily.   insulin glargine (LANTUS) 100 UNIT/ML injection Inject 10 Units into the skin daily.   metFORMIN (GLUCOPHAGE) 1000 MG tablet Take 1 tablet (1,000 mg total) by mouth 2 (two) times daily with a meal.   metoprolol succinate (TOPROL XL) 25 MG 24 hr tablet Take 1 tablet (25 mg total) by mouth daily.   mirtazapine (REMERON) 15 MG tablet Take 7.5 mg by mouth at bedtime.   Multiple Vitamin (MULTIVITAMIN) tablet Take 1 tablet by mouth daily.   oxybutynin (DITROPAN) 5 MG tablet Take 5 mg by mouth daily.   rosuvastatin (CRESTOR) 10 MG tablet Take 1 tablet (10 mg total) by mouth every Monday, Wednesday, and Friday.   sacubitril-valsartan (ENTRESTO) 24-26 MG Take 1 tablet by mouth 2 (two) times daily.   tamsulosin (FLOMAX) 0.4 MG  CAPS capsule SMARTSIG:1 Capsule(s) By Mouth Every Evening   No facility-administered encounter medications on file as of 11/25/2022.    Recent Relevant Labs: Lab Results  Component Value Date/Time   HGBA1C 6.7 (H) 11/13/2022 08:58 AM   HGBA1C 7.8 (H) 08/14/2022 08:14 AM   MICROALBUR 80 01/17/2020 08:38 AM    Kidney Function Lab Results  Component Value Date/Time   CREATININE 0.73 (L) 11/13/2022 08:59 AM   CREATININE 0.71 (L) 11/05/2022 12:23  PM   GFRNONAA 76 05/03/2020 10:04 AM   GFRAA 87 05/03/2020 10:04 AM    Star Rating Drugs:  Jardiance- PAP Metformin 1000 mg- Last filled 11-15-2022 90 DS. Previous 08-20-2022 90 DS Entresto- PAP Rosuvastatin 10 mg- Last filled 11-03-2022 84 DS. Previous 09-03-2022 90 DS  Care Gaps: Annual wellness visit in last year? No Last eye exam / retinopathy screening: 02-2022 Last diabetic foot exam: 09-03-2022  Huey Romans [facility] Clinical Pharmacist Assistant [phone]

## 2022-11-25 NOTE — Telephone Encounter (Signed)
Patient's wife called this morning stating that the patient was having UTI symptoms and was requesting if she could drop by a urine for the patient to be checked. Consulted with provider and per Dr. Sedalia Muta patient is drop off urine to be checked then pending the results Dr. Sedalia Muta will do a Tele health with patient.

## 2022-11-26 ENCOUNTER — Telehealth: Payer: Self-pay | Admitting: Family Medicine

## 2022-11-26 DIAGNOSIS — I110 Hypertensive heart disease with heart failure: Secondary | ICD-10-CM | POA: Diagnosis not present

## 2022-11-26 DIAGNOSIS — E46 Unspecified protein-calorie malnutrition: Secondary | ICD-10-CM | POA: Diagnosis not present

## 2022-11-26 DIAGNOSIS — E11319 Type 2 diabetes mellitus with unspecified diabetic retinopathy without macular edema: Secondary | ICD-10-CM | POA: Diagnosis not present

## 2022-11-26 DIAGNOSIS — L89510 Pressure ulcer of right ankle, unstageable: Secondary | ICD-10-CM | POA: Diagnosis not present

## 2022-11-26 DIAGNOSIS — L89150 Pressure ulcer of sacral region, unstageable: Secondary | ICD-10-CM | POA: Diagnosis not present

## 2022-11-26 DIAGNOSIS — I5022 Chronic systolic (congestive) heart failure: Secondary | ICD-10-CM | POA: Diagnosis not present

## 2022-11-26 NOTE — Telephone Encounter (Signed)
   Jeffrey Parsons has been scheduled for the following appointment:  WHAT: BONE DENSITY WHERE: [place] OUTPATIENT DATE: 12/04/22 TIME: 1:30 PM CHECK-IN  Patient has been made aware.

## 2022-11-27 DIAGNOSIS — L89150 Pressure ulcer of sacral region, unstageable: Secondary | ICD-10-CM | POA: Diagnosis not present

## 2022-11-27 DIAGNOSIS — L89510 Pressure ulcer of right ankle, unstageable: Secondary | ICD-10-CM | POA: Diagnosis not present

## 2022-11-27 DIAGNOSIS — E46 Unspecified protein-calorie malnutrition: Secondary | ICD-10-CM | POA: Diagnosis not present

## 2022-11-27 DIAGNOSIS — I110 Hypertensive heart disease with heart failure: Secondary | ICD-10-CM | POA: Diagnosis not present

## 2022-11-27 DIAGNOSIS — E11319 Type 2 diabetes mellitus with unspecified diabetic retinopathy without macular edema: Secondary | ICD-10-CM | POA: Diagnosis not present

## 2022-11-27 DIAGNOSIS — I5022 Chronic systolic (congestive) heart failure: Secondary | ICD-10-CM | POA: Diagnosis not present

## 2022-11-27 LAB — URINE CULTURE

## 2022-11-28 ENCOUNTER — Other Ambulatory Visit: Payer: Self-pay | Admitting: Family Medicine

## 2022-11-28 DIAGNOSIS — E782 Mixed hyperlipidemia: Secondary | ICD-10-CM

## 2022-12-01 ENCOUNTER — Ambulatory Visit (INDEPENDENT_AMBULATORY_CARE_PROVIDER_SITE_OTHER): Payer: Medicare Other | Admitting: Podiatry

## 2022-12-01 DIAGNOSIS — L97311 Non-pressure chronic ulcer of right ankle limited to breakdown of skin: Secondary | ICD-10-CM

## 2022-12-01 DIAGNOSIS — E11319 Type 2 diabetes mellitus with unspecified diabetic retinopathy without macular edema: Secondary | ICD-10-CM | POA: Diagnosis not present

## 2022-12-01 DIAGNOSIS — I110 Hypertensive heart disease with heart failure: Secondary | ICD-10-CM | POA: Diagnosis not present

## 2022-12-01 DIAGNOSIS — E1169 Type 2 diabetes mellitus with other specified complication: Secondary | ICD-10-CM

## 2022-12-01 DIAGNOSIS — E46 Unspecified protein-calorie malnutrition: Secondary | ICD-10-CM | POA: Diagnosis not present

## 2022-12-01 DIAGNOSIS — E782 Mixed hyperlipidemia: Secondary | ICD-10-CM

## 2022-12-01 DIAGNOSIS — L89510 Pressure ulcer of right ankle, unstageable: Secondary | ICD-10-CM | POA: Diagnosis not present

## 2022-12-01 DIAGNOSIS — G20B2 Parkinson's disease with dyskinesia, with fluctuations: Secondary | ICD-10-CM

## 2022-12-01 DIAGNOSIS — L89150 Pressure ulcer of sacral region, unstageable: Secondary | ICD-10-CM | POA: Diagnosis not present

## 2022-12-01 DIAGNOSIS — I5022 Chronic systolic (congestive) heart failure: Secondary | ICD-10-CM | POA: Diagnosis not present

## 2022-12-01 NOTE — Progress Notes (Signed)
Subjective:  Patient ID: Jeffrey Parsons, male    DOB: [birth year],  MRN: [id number]  Chief Complaint  Patient presents with   Wound Check    Right lateral ankle ulcer.    72 y.o. male presents for follow-up of right lateral ankle ulceration.  Denies nausea vomiting fever chills.  The wound has been being dressed by wound home wound care 2 times weekly.  Using Betadine and adhesive bandage he was placed on clindamycin 300 mg 3 times daily for 7 days for prophylaxis at last visit and is finished.  He reports the wound is looking better at this time.  Past Medical History:  Diagnosis Date   AICD (automatic cardioverter/defibrillator) present    Allergy to sulfa drugs 12/04/2016   Complete heart block (HCC) 05/26/2017   Coronary artery disease involving native coronary artery of native heart without angina pectoris 09/12/2016   Ischemic cardiomyopathy 05/26/2017   Pneumonia 2019   Retinopathy     Allergies  Allergen Reactions   Farxiga [Dapagliflozin] Other (See Comments)    Patient lost weight.   Amoxicillin Nausea And Vomiting   Onion     Upset stomach   Sulfa Antibiotics Other (See Comments)    FLUSH     ROS: Negative except as per HPI above  Objective:  General: AAO x3, NAD  Dermatological: Pinpoint duration present the lateral malleolus of the right ankle.  There is healthy granular base there is mild minimal to no erythema mild inflammation surrounding however.  There is no maceration no drainage present no deep sinus tract.  No bone exposed wound probes to subcutaneous fat level approximately 0.4cm in diameter.  Vascular:  Dorsalis Pedis artery and Posterior Tibial artery pedal pulses are 1/4 bilateral.  Capillary fill time < 3 sec to all digits.   Neruologic: Grossly diminished via light touch  Musculoskeletal: No gross boney pedal deformities bilateral. No pain, crepitus, or limitation noted with foot and ankle range of motion bilateral. Muscular strength 5/5 in all  groups tested bilateral.  Gait: Unassisted, Nonantalgic.   No images are attached to the encounter.  Radiographs:  Deferred Assessment:   1. Ulcer of right ankle, limited to breakdown of skin (HCC)   2. Combined hyperlipidemia associated with type 2 diabetes mellitus (HCC)   3. Parkinson's disease with dyskinesia and fluctuating manifestations      Plan:  Patient was evaluated and treated and all questions answered.  #Ulcer right lateral ankle -Continue with wound care per home health nurse overall the wound appears healthy and very small at this time -Continue with 2 times weekly Betadine and adhesive bandage dressing change -We discussed the etiology and factors that are a part of the wound healing process.  We also discussed the risk of infection both soft tissue and osteomyelitis from open ulceration.  Discussed the risk of limb loss if this happens or worsens. -Debridement as below. -Dressed with dressing per home health care -Continue home dressing changes  2 times weekly  -Continue off-loading with felt padding. -Vascular testing deferred -Last antibiotics: No further antibiotics indicated -Imaging: Deferred  Procedure: Excisional Debridement of Wound Rationale: Removal of non-viable soft tissue from the wound to promote healing.  Anesthesia: none Post-Debridement Wound Measurements: 0.4 cm x 0.4 cm x 0.2 cm  Type of Debridement: Sharp Excisional Tissue Removed: Non-viable soft tissue Depth of Debridement: subcutaneous tissue. Technique: Sharp excisional debridement to bleeding, viable wound base.  Dressing: Dry, sterile, compression dressing. Disposition: Patient tolerated procedure well.   Return in  about 4 weeks (around 12/29/2022) for f/u R lateral ankle ulcer.          Corinna Gab, DPM [facility] / [facility]

## 2022-12-03 ENCOUNTER — Ambulatory Visit (INDEPENDENT_AMBULATORY_CARE_PROVIDER_SITE_OTHER): Payer: Medicare Other | Admitting: Podiatry

## 2022-12-03 DIAGNOSIS — B351 Tinea unguium: Secondary | ICD-10-CM | POA: Diagnosis not present

## 2022-12-03 DIAGNOSIS — M79675 Pain in left toe(s): Secondary | ICD-10-CM | POA: Diagnosis not present

## 2022-12-03 DIAGNOSIS — E11319 Type 2 diabetes mellitus with unspecified diabetic retinopathy without macular edema: Secondary | ICD-10-CM | POA: Diagnosis not present

## 2022-12-03 DIAGNOSIS — E46 Unspecified protein-calorie malnutrition: Secondary | ICD-10-CM | POA: Diagnosis not present

## 2022-12-03 DIAGNOSIS — L89150 Pressure ulcer of sacral region, unstageable: Secondary | ICD-10-CM | POA: Diagnosis not present

## 2022-12-03 DIAGNOSIS — I5022 Chronic systolic (congestive) heart failure: Secondary | ICD-10-CM | POA: Diagnosis not present

## 2022-12-03 DIAGNOSIS — M79674 Pain in right toe(s): Secondary | ICD-10-CM

## 2022-12-03 DIAGNOSIS — L89510 Pressure ulcer of right ankle, unstageable: Secondary | ICD-10-CM | POA: Diagnosis not present

## 2022-12-03 DIAGNOSIS — I110 Hypertensive heart disease with heart failure: Secondary | ICD-10-CM | POA: Diagnosis not present

## 2022-12-03 NOTE — Progress Notes (Signed)
       Subjective:  Patient ID: Jeffrey Parsons, male    DOB: 13-May-1951,  MRN: [id number]   Jeffrey Parsons presents to clinic today for:  Chief Complaint  Patient presents with   Nail Problem    Rm 1 RFC bilateral nail trim  . Patient notes nails are thick, discolored, elongated and painful in shoegear when trying to ambulate.  Patient's wife is with him today and he is in a wheelchair.  He does have Parkinson's disease.  PCP is Cox, Fritzi Mandes, MD.  Allergies  Allergen Reactions   [name] [Dapagliflozin] Other (See Comments)    Patient lost weight.   Amoxicillin Nausea And Vomiting   Onion     Upset stomach   Sulfa Antibiotics Other (See Comments)    FLUSH     Review of Systems: Negative except as noted in the HPI.  Objective:  There were no vitals filed for this visit.  Jeffrey Parsons is a pleasant 72 y.o. male in NAD. AAO x 3.  Vascular Examination: Capillary refill time is 3-5 seconds to toes bilateral. Palpable pedal pulses b/l LE. Digital hair present b/l. No pedal edema b/l. Skin temperature gradient WNL b/l. No varicosities b/l. No cyanosis or clubbing noted b/l.   Dermatological Examination: Pedal skin with normal turgor, texture and tone b/l. No open wounds. No interdigital macerations b/l. Toenails x10 are 3mm thick, discolored, dystrophic with subungual debris. There is pain with compression of the nail plates.  They are elongated x10  Neurological Examination: Protective sensation intact bilateral LE. Vibratory sensation intact bilateral LE.  Musculoskeletal Examination: Muscle strength 5/5 to all LE muscle groups b/l.      Latest Ref Rng & Units 11/13/2022    8:58 AM 08/14/2022    8:14 AM 05/12/2022    8:35 AM 01/31/2022    8:33 AM  Hemoglobin A1C  Hemoglobin-A1c 4.8 - 5.6 % 6.7  7.8  7.3  7.1    Assessment/Plan: 1. Pain due to onychomycosis of toenails of both feet     The mycotic toenails were sharply debrided x10 with sterile nail nippers and a  power debriding burr to decrease bulk/thickness and length.    Return in about 3 months (around 03/05/2023) for [facility] / nail trim.   Clerance Lav, DPM, FACFAS [facility]     [address] [place], [place] [address]                Office [phone]  Fax [phone]

## 2022-12-04 ENCOUNTER — Telehealth: Payer: Self-pay | Admitting: Family Medicine

## 2022-12-04 ENCOUNTER — Telehealth: Payer: Self-pay

## 2022-12-04 DIAGNOSIS — I110 Hypertensive heart disease with heart failure: Secondary | ICD-10-CM | POA: Diagnosis not present

## 2022-12-04 DIAGNOSIS — L89150 Pressure ulcer of sacral region, unstageable: Secondary | ICD-10-CM | POA: Diagnosis not present

## 2022-12-04 DIAGNOSIS — E46 Unspecified protein-calorie malnutrition: Secondary | ICD-10-CM | POA: Diagnosis not present

## 2022-12-04 DIAGNOSIS — I5022 Chronic systolic (congestive) heart failure: Secondary | ICD-10-CM | POA: Diagnosis not present

## 2022-12-04 DIAGNOSIS — M810 Age-related osteoporosis without current pathological fracture: Secondary | ICD-10-CM | POA: Diagnosis not present

## 2022-12-04 DIAGNOSIS — E11319 Type 2 diabetes mellitus with unspecified diabetic retinopathy without macular edema: Secondary | ICD-10-CM | POA: Diagnosis not present

## 2022-12-04 DIAGNOSIS — L89510 Pressure ulcer of right ankle, unstageable: Secondary | ICD-10-CM | POA: Diagnosis not present

## 2022-12-04 LAB — HM DEXA SCAN

## 2022-12-04 NOTE — Telephone Encounter (Signed)
Kathlene November called from [facility] OT requesting verbal orders to extend visits for Mr Jeffrey Parsons for one week, two visits per week.  Verbal orders given, he will fax over orders to sign.

## 2022-12-04 NOTE — Telephone Encounter (Signed)
[facility]  11/27/22 TO 11/27/22 SN FOR WOUND CARE

## 2022-12-05 ENCOUNTER — Encounter: Payer: Self-pay | Admitting: Podiatry

## 2022-12-05 ENCOUNTER — Encounter: Payer: Self-pay | Admitting: Family Medicine

## 2022-12-05 DIAGNOSIS — I110 Hypertensive heart disease with heart failure: Secondary | ICD-10-CM | POA: Diagnosis not present

## 2022-12-05 DIAGNOSIS — L89150 Pressure ulcer of sacral region, unstageable: Secondary | ICD-10-CM | POA: Diagnosis not present

## 2022-12-05 DIAGNOSIS — L89510 Pressure ulcer of right ankle, unstageable: Secondary | ICD-10-CM | POA: Diagnosis not present

## 2022-12-05 DIAGNOSIS — I5022 Chronic systolic (congestive) heart failure: Secondary | ICD-10-CM | POA: Diagnosis not present

## 2022-12-05 DIAGNOSIS — E46 Unspecified protein-calorie malnutrition: Secondary | ICD-10-CM | POA: Diagnosis not present

## 2022-12-05 DIAGNOSIS — E11319 Type 2 diabetes mellitus with unspecified diabetic retinopathy without macular edema: Secondary | ICD-10-CM | POA: Diagnosis not present

## 2022-12-09 DIAGNOSIS — L89150 Pressure ulcer of sacral region, unstageable: Secondary | ICD-10-CM | POA: Diagnosis not present

## 2022-12-09 DIAGNOSIS — I5022 Chronic systolic (congestive) heart failure: Secondary | ICD-10-CM | POA: Diagnosis not present

## 2022-12-09 DIAGNOSIS — I110 Hypertensive heart disease with heart failure: Secondary | ICD-10-CM | POA: Diagnosis not present

## 2022-12-09 DIAGNOSIS — L89510 Pressure ulcer of right ankle, unstageable: Secondary | ICD-10-CM | POA: Diagnosis not present

## 2022-12-09 DIAGNOSIS — E46 Unspecified protein-calorie malnutrition: Secondary | ICD-10-CM | POA: Diagnosis not present

## 2022-12-09 DIAGNOSIS — E11319 Type 2 diabetes mellitus with unspecified diabetic retinopathy without macular edema: Secondary | ICD-10-CM | POA: Diagnosis not present

## 2022-12-10 DIAGNOSIS — E11319 Type 2 diabetes mellitus with unspecified diabetic retinopathy without macular edema: Secondary | ICD-10-CM | POA: Diagnosis not present

## 2022-12-10 DIAGNOSIS — I110 Hypertensive heart disease with heart failure: Secondary | ICD-10-CM | POA: Diagnosis not present

## 2022-12-10 DIAGNOSIS — L89150 Pressure ulcer of sacral region, unstageable: Secondary | ICD-10-CM | POA: Diagnosis not present

## 2022-12-10 DIAGNOSIS — E46 Unspecified protein-calorie malnutrition: Secondary | ICD-10-CM | POA: Diagnosis not present

## 2022-12-10 DIAGNOSIS — I5022 Chronic systolic (congestive) heart failure: Secondary | ICD-10-CM | POA: Diagnosis not present

## 2022-12-10 DIAGNOSIS — L89510 Pressure ulcer of right ankle, unstageable: Secondary | ICD-10-CM | POA: Diagnosis not present

## 2022-12-11 ENCOUNTER — Telehealth: Payer: Self-pay

## 2022-12-11 DIAGNOSIS — E46 Unspecified protein-calorie malnutrition: Secondary | ICD-10-CM | POA: Diagnosis not present

## 2022-12-11 DIAGNOSIS — L89150 Pressure ulcer of sacral region, unstageable: Secondary | ICD-10-CM | POA: Diagnosis not present

## 2022-12-11 DIAGNOSIS — I5022 Chronic systolic (congestive) heart failure: Secondary | ICD-10-CM | POA: Diagnosis not present

## 2022-12-11 DIAGNOSIS — E11319 Type 2 diabetes mellitus with unspecified diabetic retinopathy without macular edema: Secondary | ICD-10-CM | POA: Diagnosis not present

## 2022-12-11 DIAGNOSIS — N3091 Cystitis, unspecified with hematuria: Secondary | ICD-10-CM | POA: Diagnosis not present

## 2022-12-11 DIAGNOSIS — I110 Hypertensive heart disease with heart failure: Secondary | ICD-10-CM | POA: Diagnosis not present

## 2022-12-11 DIAGNOSIS — L89510 Pressure ulcer of right ankle, unstageable: Secondary | ICD-10-CM | POA: Diagnosis not present

## 2022-12-11 DIAGNOSIS — N3020 Other chronic cystitis without hematuria: Secondary | ICD-10-CM | POA: Diagnosis not present

## 2022-12-11 DIAGNOSIS — R300 Dysuria: Secondary | ICD-10-CM | POA: Diagnosis not present

## 2022-12-11 NOTE — Telephone Encounter (Signed)
Patient's home health nurse Joni Reining called stating that the patient has having urinary frequency and burning and was previously hospitalized from a UTI. Told the nurse that we would call the patient's wife and speak with her to let her know that the patient would need to be seen for an appointment to be checked out. Called the wife Stanton Kidney and spoke with her and informed her that they could not just drop a urine off, that patient would have to be seen and scheduled and considering there are no available appointments left for the rest of the weekend this is something that can't wait and patient will need to go to the nearest urgent care to be checked out. Patient's wife understood verbally.

## 2022-12-15 ENCOUNTER — Telehealth: Payer: Self-pay | Admitting: Family Medicine

## 2022-12-15 NOTE — Telephone Encounter (Signed)
Home Health [facility] -Supplemental orders from 12/03/22 to 12/14/22

## 2022-12-16 ENCOUNTER — Telehealth: Payer: Self-pay

## 2022-12-16 DIAGNOSIS — E46 Unspecified protein-calorie malnutrition: Secondary | ICD-10-CM | POA: Diagnosis not present

## 2022-12-16 DIAGNOSIS — L89510 Pressure ulcer of right ankle, unstageable: Secondary | ICD-10-CM | POA: Diagnosis not present

## 2022-12-16 DIAGNOSIS — I110 Hypertensive heart disease with heart failure: Secondary | ICD-10-CM | POA: Diagnosis not present

## 2022-12-16 DIAGNOSIS — L89150 Pressure ulcer of sacral region, unstageable: Secondary | ICD-10-CM | POA: Diagnosis not present

## 2022-12-16 DIAGNOSIS — E11319 Type 2 diabetes mellitus with unspecified diabetic retinopathy without macular edema: Secondary | ICD-10-CM | POA: Diagnosis not present

## 2022-12-16 DIAGNOSIS — I5022 Chronic systolic (congestive) heart failure: Secondary | ICD-10-CM | POA: Diagnosis not present

## 2022-12-16 NOTE — Telephone Encounter (Signed)
Joni Reining called from [facility] health stating that she was requesting visit for re-certifying patient for more visits with her. I gave her the verbal ok with the promise that orders will be faxed over to be signed by the provider. She stated that they will be hand faxed over.

## 2022-12-17 ENCOUNTER — Telehealth: Payer: Medicare Other

## 2022-12-17 DIAGNOSIS — N201 Calculus of ureter: Secondary | ICD-10-CM | POA: Diagnosis not present

## 2022-12-17 DIAGNOSIS — N200 Calculus of kidney: Secondary | ICD-10-CM | POA: Diagnosis not present

## 2022-12-17 DIAGNOSIS — R351 Nocturia: Secondary | ICD-10-CM | POA: Diagnosis not present

## 2022-12-17 DIAGNOSIS — N401 Enlarged prostate with lower urinary tract symptoms: Secondary | ICD-10-CM | POA: Diagnosis not present

## 2022-12-17 DIAGNOSIS — R339 Retention of urine, unspecified: Secondary | ICD-10-CM | POA: Diagnosis not present

## 2022-12-18 DIAGNOSIS — E11319 Type 2 diabetes mellitus with unspecified diabetic retinopathy without macular edema: Secondary | ICD-10-CM | POA: Diagnosis not present

## 2022-12-18 DIAGNOSIS — I110 Hypertensive heart disease with heart failure: Secondary | ICD-10-CM | POA: Diagnosis not present

## 2022-12-18 DIAGNOSIS — I5022 Chronic systolic (congestive) heart failure: Secondary | ICD-10-CM | POA: Diagnosis not present

## 2022-12-18 DIAGNOSIS — L89510 Pressure ulcer of right ankle, unstageable: Secondary | ICD-10-CM | POA: Diagnosis not present

## 2022-12-18 DIAGNOSIS — E46 Unspecified protein-calorie malnutrition: Secondary | ICD-10-CM | POA: Diagnosis not present

## 2022-12-18 DIAGNOSIS — L89150 Pressure ulcer of sacral region, unstageable: Secondary | ICD-10-CM | POA: Diagnosis not present

## 2022-12-19 DIAGNOSIS — M519 Unspecified thoracic, thoracolumbar and lumbosacral intervertebral disc disorder: Secondary | ICD-10-CM | POA: Diagnosis not present

## 2022-12-19 DIAGNOSIS — I255 Ischemic cardiomyopathy: Secondary | ICD-10-CM | POA: Diagnosis not present

## 2022-12-19 DIAGNOSIS — I700 Atherosclerosis of aorta: Secondary | ICD-10-CM | POA: Diagnosis not present

## 2022-12-19 DIAGNOSIS — Z7982 Long term (current) use of aspirin: Secondary | ICD-10-CM | POA: Diagnosis not present

## 2022-12-19 DIAGNOSIS — G8929 Other chronic pain: Secondary | ICD-10-CM | POA: Diagnosis not present

## 2022-12-19 DIAGNOSIS — L89510 Pressure ulcer of right ankle, unstageable: Secondary | ICD-10-CM | POA: Diagnosis not present

## 2022-12-19 DIAGNOSIS — Z556 Problems related to health literacy: Secondary | ICD-10-CM | POA: Diagnosis not present

## 2022-12-19 DIAGNOSIS — Z7984 Long term (current) use of oral hypoglycemic drugs: Secondary | ICD-10-CM | POA: Diagnosis not present

## 2022-12-19 DIAGNOSIS — G20B2 Parkinson's disease with dyskinesia, with fluctuations: Secondary | ICD-10-CM | POA: Diagnosis not present

## 2022-12-19 DIAGNOSIS — I442 Atrioventricular block, complete: Secondary | ICD-10-CM | POA: Diagnosis not present

## 2022-12-19 DIAGNOSIS — L89150 Pressure ulcer of sacral region, unstageable: Secondary | ICD-10-CM | POA: Diagnosis not present

## 2022-12-19 DIAGNOSIS — E785 Hyperlipidemia, unspecified: Secondary | ICD-10-CM | POA: Diagnosis not present

## 2022-12-19 DIAGNOSIS — Z95810 Presence of automatic (implantable) cardiac defibrillator: Secondary | ICD-10-CM | POA: Diagnosis not present

## 2022-12-19 DIAGNOSIS — M1990 Unspecified osteoarthritis, unspecified site: Secondary | ICD-10-CM | POA: Diagnosis not present

## 2022-12-19 DIAGNOSIS — I110 Hypertensive heart disease with heart failure: Secondary | ICD-10-CM | POA: Diagnosis not present

## 2022-12-19 DIAGNOSIS — I5022 Chronic systolic (congestive) heart failure: Secondary | ICD-10-CM | POA: Diagnosis not present

## 2022-12-19 DIAGNOSIS — E46 Unspecified protein-calorie malnutrition: Secondary | ICD-10-CM | POA: Diagnosis not present

## 2022-12-19 DIAGNOSIS — E1169 Type 2 diabetes mellitus with other specified complication: Secondary | ICD-10-CM | POA: Diagnosis not present

## 2022-12-19 DIAGNOSIS — I2510 Atherosclerotic heart disease of native coronary artery without angina pectoris: Secondary | ICD-10-CM | POA: Diagnosis not present

## 2022-12-19 DIAGNOSIS — Z794 Long term (current) use of insulin: Secondary | ICD-10-CM | POA: Diagnosis not present

## 2022-12-19 DIAGNOSIS — Z951 Presence of aortocoronary bypass graft: Secondary | ICD-10-CM | POA: Diagnosis not present

## 2022-12-19 DIAGNOSIS — E860 Dehydration: Secondary | ICD-10-CM | POA: Diagnosis not present

## 2022-12-19 DIAGNOSIS — N400 Enlarged prostate without lower urinary tract symptoms: Secondary | ICD-10-CM | POA: Diagnosis not present

## 2022-12-19 DIAGNOSIS — R339 Retention of urine, unspecified: Secondary | ICD-10-CM | POA: Diagnosis not present

## 2022-12-19 DIAGNOSIS — E11319 Type 2 diabetes mellitus with unspecified diabetic retinopathy without macular edema: Secondary | ICD-10-CM | POA: Diagnosis not present

## 2022-12-23 DIAGNOSIS — L89150 Pressure ulcer of sacral region, unstageable: Secondary | ICD-10-CM | POA: Diagnosis not present

## 2022-12-23 DIAGNOSIS — L89510 Pressure ulcer of right ankle, unstageable: Secondary | ICD-10-CM | POA: Diagnosis not present

## 2022-12-23 DIAGNOSIS — I110 Hypertensive heart disease with heart failure: Secondary | ICD-10-CM | POA: Diagnosis not present

## 2022-12-23 DIAGNOSIS — E11319 Type 2 diabetes mellitus with unspecified diabetic retinopathy without macular edema: Secondary | ICD-10-CM | POA: Diagnosis not present

## 2022-12-23 DIAGNOSIS — E46 Unspecified protein-calorie malnutrition: Secondary | ICD-10-CM | POA: Diagnosis not present

## 2022-12-23 DIAGNOSIS — I5022 Chronic systolic (congestive) heart failure: Secondary | ICD-10-CM | POA: Diagnosis not present

## 2022-12-24 DIAGNOSIS — I110 Hypertensive heart disease with heart failure: Secondary | ICD-10-CM | POA: Diagnosis not present

## 2022-12-24 DIAGNOSIS — E46 Unspecified protein-calorie malnutrition: Secondary | ICD-10-CM | POA: Diagnosis not present

## 2022-12-24 DIAGNOSIS — L89150 Pressure ulcer of sacral region, unstageable: Secondary | ICD-10-CM | POA: Diagnosis not present

## 2022-12-24 DIAGNOSIS — E11319 Type 2 diabetes mellitus with unspecified diabetic retinopathy without macular edema: Secondary | ICD-10-CM | POA: Diagnosis not present

## 2022-12-24 DIAGNOSIS — I5022 Chronic systolic (congestive) heart failure: Secondary | ICD-10-CM | POA: Diagnosis not present

## 2022-12-24 DIAGNOSIS — L89510 Pressure ulcer of right ankle, unstageable: Secondary | ICD-10-CM | POA: Diagnosis not present

## 2022-12-25 DIAGNOSIS — L89510 Pressure ulcer of right ankle, unstageable: Secondary | ICD-10-CM | POA: Diagnosis not present

## 2022-12-25 DIAGNOSIS — E11319 Type 2 diabetes mellitus with unspecified diabetic retinopathy without macular edema: Secondary | ICD-10-CM | POA: Diagnosis not present

## 2022-12-25 DIAGNOSIS — L89150 Pressure ulcer of sacral region, unstageable: Secondary | ICD-10-CM | POA: Diagnosis not present

## 2022-12-25 DIAGNOSIS — I110 Hypertensive heart disease with heart failure: Secondary | ICD-10-CM | POA: Diagnosis not present

## 2022-12-25 DIAGNOSIS — E46 Unspecified protein-calorie malnutrition: Secondary | ICD-10-CM | POA: Diagnosis not present

## 2022-12-25 DIAGNOSIS — I5022 Chronic systolic (congestive) heart failure: Secondary | ICD-10-CM | POA: Diagnosis not present

## 2022-12-29 ENCOUNTER — Telehealth: Payer: Self-pay | Admitting: Family Medicine

## 2022-12-29 ENCOUNTER — Ambulatory Visit: Payer: Medicare Other | Admitting: Podiatry

## 2022-12-29 NOTE — Telephone Encounter (Signed)
[facility] Home Health poc 12/19/22 to 02/16/23

## 2022-12-29 NOTE — Telephone Encounter (Signed)
Supplemental Orders from 12/24/22 to 12/24/22

## 2022-12-30 DIAGNOSIS — I5022 Chronic systolic (congestive) heart failure: Secondary | ICD-10-CM | POA: Diagnosis not present

## 2022-12-30 DIAGNOSIS — E11319 Type 2 diabetes mellitus with unspecified diabetic retinopathy without macular edema: Secondary | ICD-10-CM | POA: Diagnosis not present

## 2022-12-30 DIAGNOSIS — E46 Unspecified protein-calorie malnutrition: Secondary | ICD-10-CM | POA: Diagnosis not present

## 2022-12-30 DIAGNOSIS — I110 Hypertensive heart disease with heart failure: Secondary | ICD-10-CM | POA: Diagnosis not present

## 2022-12-30 DIAGNOSIS — L89150 Pressure ulcer of sacral region, unstageable: Secondary | ICD-10-CM | POA: Diagnosis not present

## 2022-12-30 DIAGNOSIS — L89510 Pressure ulcer of right ankle, unstageable: Secondary | ICD-10-CM | POA: Diagnosis not present

## 2022-12-31 DIAGNOSIS — L89510 Pressure ulcer of right ankle, unstageable: Secondary | ICD-10-CM | POA: Diagnosis not present

## 2022-12-31 DIAGNOSIS — I5022 Chronic systolic (congestive) heart failure: Secondary | ICD-10-CM | POA: Diagnosis not present

## 2022-12-31 DIAGNOSIS — I110 Hypertensive heart disease with heart failure: Secondary | ICD-10-CM | POA: Diagnosis not present

## 2022-12-31 DIAGNOSIS — E11319 Type 2 diabetes mellitus with unspecified diabetic retinopathy without macular edema: Secondary | ICD-10-CM | POA: Diagnosis not present

## 2022-12-31 DIAGNOSIS — L89150 Pressure ulcer of sacral region, unstageable: Secondary | ICD-10-CM | POA: Diagnosis not present

## 2022-12-31 DIAGNOSIS — E46 Unspecified protein-calorie malnutrition: Secondary | ICD-10-CM | POA: Diagnosis not present

## 2023-01-05 DIAGNOSIS — L89150 Pressure ulcer of sacral region, unstageable: Secondary | ICD-10-CM | POA: Diagnosis not present

## 2023-01-05 DIAGNOSIS — I110 Hypertensive heart disease with heart failure: Secondary | ICD-10-CM | POA: Diagnosis not present

## 2023-01-05 DIAGNOSIS — E46 Unspecified protein-calorie malnutrition: Secondary | ICD-10-CM | POA: Diagnosis not present

## 2023-01-05 DIAGNOSIS — I5022 Chronic systolic (congestive) heart failure: Secondary | ICD-10-CM | POA: Diagnosis not present

## 2023-01-05 DIAGNOSIS — L89510 Pressure ulcer of right ankle, unstageable: Secondary | ICD-10-CM | POA: Diagnosis not present

## 2023-01-05 DIAGNOSIS — E11319 Type 2 diabetes mellitus with unspecified diabetic retinopathy without macular edema: Secondary | ICD-10-CM | POA: Diagnosis not present

## 2023-01-06 DIAGNOSIS — E11319 Type 2 diabetes mellitus with unspecified diabetic retinopathy without macular edema: Secondary | ICD-10-CM | POA: Diagnosis not present

## 2023-01-06 DIAGNOSIS — L89150 Pressure ulcer of sacral region, unstageable: Secondary | ICD-10-CM | POA: Diagnosis not present

## 2023-01-06 DIAGNOSIS — L89510 Pressure ulcer of right ankle, unstageable: Secondary | ICD-10-CM | POA: Diagnosis not present

## 2023-01-06 DIAGNOSIS — I110 Hypertensive heart disease with heart failure: Secondary | ICD-10-CM | POA: Diagnosis not present

## 2023-01-06 DIAGNOSIS — I5022 Chronic systolic (congestive) heart failure: Secondary | ICD-10-CM | POA: Diagnosis not present

## 2023-01-06 DIAGNOSIS — E46 Unspecified protein-calorie malnutrition: Secondary | ICD-10-CM | POA: Diagnosis not present

## 2023-01-07 ENCOUNTER — Ambulatory Visit (INDEPENDENT_AMBULATORY_CARE_PROVIDER_SITE_OTHER): Payer: Medicare Other

## 2023-01-07 DIAGNOSIS — I495 Sick sinus syndrome: Secondary | ICD-10-CM

## 2023-01-07 LAB — CUP PACEART REMOTE DEVICE CHECK
Battery Remaining Longevity: 23 mo
Battery Voltage: 2.94 V
Brady Statistic AP VP Percent: 0.68 %
Brady Statistic AP VS Percent: 0.01 %
Brady Statistic AS VP Percent: 97.91 %
Brady Statistic AS VS Percent: 1.41 %
Brady Statistic RA Percent Paced: 0.69 %
Brady Statistic RV Percent Paced: 97.5 %
Date Time Interrogation Session: 20240710001703
HighPow Impedance: 49 Ohm
Implantable Lead Connection Status: 753985
Implantable Lead Connection Status: 753985
Implantable Lead Connection Status: 753985
Implantable Lead Implant Date: 20111213
Implantable Lead Implant Date: 20191230
Implantable Lead Implant Date: 20191230
Implantable Lead Location: 753858
Implantable Lead Location: 753859
Implantable Lead Location: 753860
Implantable Lead Model: 5076
Implantable Pulse Generator Implant Date: 20191230
Lead Channel Impedance Value: 190 Ohm
Lead Channel Impedance Value: 194.634
Lead Channel Impedance Value: 214.783
Lead Channel Impedance Value: 214.783
Lead Channel Impedance Value: 220.723
Lead Channel Impedance Value: 323 Ohm
Lead Channel Impedance Value: 380 Ohm
Lead Channel Impedance Value: 380 Ohm
Lead Channel Impedance Value: 380 Ohm
Lead Channel Impedance Value: 399 Ohm
Lead Channel Impedance Value: 399 Ohm
Lead Channel Impedance Value: 494 Ohm
Lead Channel Impedance Value: 646 Ohm
Lead Channel Impedance Value: 646 Ohm
Lead Channel Impedance Value: 665 Ohm
Lead Channel Impedance Value: 779 Ohm
Lead Channel Impedance Value: 779 Ohm
Lead Channel Impedance Value: 779 Ohm
Lead Channel Pacing Threshold Amplitude: 0.625 V
Lead Channel Pacing Threshold Amplitude: 0.625 V
Lead Channel Pacing Threshold Amplitude: 0.875 V
Lead Channel Pacing Threshold Pulse Width: 0.4 ms
Lead Channel Pacing Threshold Pulse Width: 0.4 ms
Lead Channel Pacing Threshold Pulse Width: 0.4 ms
Lead Channel Sensing Intrinsic Amplitude: 12.875 mV
Lead Channel Sensing Intrinsic Amplitude: 12.875 mV
Lead Channel Sensing Intrinsic Amplitude: 2.625 mV
Lead Channel Sensing Intrinsic Amplitude: 2.625 mV
Lead Channel Setting Pacing Amplitude: 1.25 V
Lead Channel Setting Pacing Amplitude: 1.75 V
Lead Channel Setting Pacing Amplitude: 2 V
Lead Channel Setting Pacing Pulse Width: 0.4 ms
Lead Channel Setting Pacing Pulse Width: 0.4 ms
Lead Channel Setting Sensing Sensitivity: 0.45 mV
Zone Setting Status: 755011
Zone Setting Status: 755011

## 2023-01-08 ENCOUNTER — Telehealth: Payer: Self-pay | Admitting: Cardiology

## 2023-01-08 DIAGNOSIS — L89150 Pressure ulcer of sacral region, unstageable: Secondary | ICD-10-CM | POA: Diagnosis not present

## 2023-01-08 DIAGNOSIS — L89510 Pressure ulcer of right ankle, unstageable: Secondary | ICD-10-CM | POA: Diagnosis not present

## 2023-01-08 DIAGNOSIS — E11319 Type 2 diabetes mellitus with unspecified diabetic retinopathy without macular edema: Secondary | ICD-10-CM | POA: Diagnosis not present

## 2023-01-08 DIAGNOSIS — E46 Unspecified protein-calorie malnutrition: Secondary | ICD-10-CM | POA: Diagnosis not present

## 2023-01-08 DIAGNOSIS — I110 Hypertensive heart disease with heart failure: Secondary | ICD-10-CM | POA: Diagnosis not present

## 2023-01-08 DIAGNOSIS — I5022 Chronic systolic (congestive) heart failure: Secondary | ICD-10-CM | POA: Diagnosis not present

## 2023-01-08 NOTE — Telephone Encounter (Signed)
Pt c/o swelling: STAT is pt has developed SOB within 24 hours  If swelling, where is the swelling located? Leg swelling, left leg is worse  How much weight have you gained and in what time span? Gaining a pound every 3 days.   Have you gained 3 pounds in a day or 5 pounds in a week? Wife states he hasn't   Do you have a log of your daily weights (if so, list)? Wife states they don't weigh him everyday.   Are you currently taking a fluid pill? no  Are you currently SOB? No   Have you traveled recently? No    I did schedule him an appt with Wallis Bamberg for 7/16.

## 2023-01-08 NOTE — Telephone Encounter (Signed)
Spoke with pts spouse. She stated that he has had weight gain and the left leg is more swollen that the right. She denied any redness or pain. Advised per Dr. Kem Parkinson note to go to Urgent care or med center for his symptoms. Spouse verbalized understanding and had no further questions.

## 2023-01-11 NOTE — Progress Notes (Signed)
Cardiology Office Note:  .   Date:  01/13/2023  ID:  Jeffrey Parsons, DOB [birth year], MRN [id number] PCP: Blane Ohara, MD  [facility] Providers Cardiologist:  Norman Herrlich, MD Electrophysiologist:  Sherryl Manges, MD    History of Present Illness: .   Jeffrey Parsons is a 72 y.o. male with a past medical history of CAD s/p CABG x 5~20 years ago, ischemic cardiomyopathy, sick sinus syndrome s/p biventricular ICD 2020, DM2, Parkinson's disease, mixed hyperlipidemia.  Device check 01/07/2023, normal device function 04/17/2022 echo EF 20 to 25%, severely decreased function, positive RWMA, severe hypokinesis of the LV, no valvular abnormalities. 06/16/2017 left heart cath severe three-vessel CAD with TCO RCA, LCx, LAD, patent grafts of LIMA to LAD, SVG to diagonal and free radial to OM grafts  Most recently was evaluated by Dr. Dulce Sellar on 09/03/2022 for orthostatic hypotension, with recurrent falls secondary to Parkinson's disease.   They recently notified our triage line with concerns of pedal edema, made an appointment for follow-up.  Presents today accompanied with his wife and the edema has since resolved.  They stated that they switched back to low-sodium potato chips and they have not had further issues with pedal edema.  Mr. Silvers offers no complaints today, is feeling about at his baseline. She denies chest pain, palpitations, dyspnea, pnd, orthopnea, n, v, dizziness, syncope, edema, weight gain, or early satiety.   ROS: Review of Systems  Constitutional: Negative.   HENT: Negative.    Eyes: Negative.   Respiratory: Negative.    Cardiovascular: Negative.   Gastrointestinal: Negative.   Genitourinary: Negative.   Musculoskeletal:  Negative for falls.  Skin: Negative.   Neurological:  Positive for tremors and weakness.  Endo/Heme/Allergies: Negative.   Psychiatric/Behavioral: Negative.       Studies Reviewed: .        Cardiac Studies & Procedures   CARDIAC  CATHETERIZATION  CARDIAC CATHETERIZATION 06/16/2017  Narrative  Prox RCA to Mid RCA lesion is 100% stenosed.  Ost Cx to Mid Cx lesion is 100% stenosed.  Ost LM to Dist LM lesion is 75% stenosed.  Ost 1st Diag to 1st Diag lesion is 90% stenosed.  Prox LAD-1 lesion is 80% stenosed.  Prox LAD-2 lesion is 100% stenosed.  LIMA graft was visualized by angiography and is normal in caliber.  The graft exhibits no disease.  Left radial artery graft was visualized by angiography and is normal in caliber.  The graft exhibits no disease.  SVG graft was visualized by angiography.  The graft exhibits mild diffuse disease.  Prox Graft lesion is 100% stenosed.  SVG.  SVG.  Origin to Prox Graft lesion is 100% stenosed.  1. Severe native 3 vessel CAD with total occlusion of the RCA, LCx, and LAD 2. S/P CABG with continued patency of the LIMA-LAD, SVG-diagonal, and free radial-OM grafts 3. Chronic occlusion of the SVG-PDA and SVG-PLA, with the distal RCA receiving late antegrade filling and faint collaterals from the left coronary 4. Elevated LVEDP (21 mmHg), preserved cardiac output, and normal R heart pressures  Ongoing medical therapy is recommended.  Findings Coronary Findings Diagnostic  Dominance: Right  Left Main Ost LM to Dist LM lesion is 75% stenosed.  Left Anterior Descending Prox LAD-1 lesion is 80% stenosed. The lesion is calcified. Prox LAD-2 lesion is 100% stenosed.  First Diagonal Branch Ost 1st Diag to 1st Diag lesion is 90% stenosed.  Left Circumflex Ost Cx to Mid Cx lesion is 100% stenosed.  Right Coronary Artery There  is some antegrade filling of the RCA, no well-formed collaterals but possibly micro-channels or recanalization of the vessel. The PDA and PLA branches fill late. There are also left-to-right collaterals Prox RCA to Mid RCA lesion is 100% stenosed.  Right Posterior Descending Artery Collaterals RPDA filled by collaterals from Prox  RCA.  First Right Posterolateral Branch Collaterals 1st RPL filled by collaterals from 1st Sept.  LIMA LIMA Graft To Mid LAD LIMA graft was visualized by angiography and is normal in caliber. The graft exhibits no disease. widely patent LIMA-LAD graft  Left Radial Artery Graft To 2nd Mrg Left radial artery graft was visualized by angiography and is normal in caliber. The graft exhibits no disease. widely patent left radial to OM2 graft  Saphenous Graft To 1st Diag SVG graft was visualized by angiography. The graft exhibits mild diffuse disease. patent SVG-diagonal with mild diffuse irregularity  Saphenous Graft To 1st RPL SVG. total occlusion of the SVG-PLA Prox Graft lesion is 100% stenosed.  Saphenous Graft To RPDA SVG. total occlusion of the SVG-PDA Origin to Prox Graft lesion is 100% stenosed.  Intervention  No interventions have been documented.     ECHOCARDIOGRAM  ECHOCARDIOGRAM COMPLETE 04/17/2022  Narrative ECHOCARDIOGRAM REPORT    Patient Name:   Jeffrey Parsons Date of Exam: 04/17/2022 Medical Rec #:  [id number]        Height:       67.0 in Accession #:    [id number]       Weight:       135.2 lb Date of Birth:  1950-11-21        BSA:          1.712 m Patient Age:    71 years         BP:           96/48 mmHg Patient Gender: M                HR:           81 bpm. Exam Location:  [place]  Procedure: 2D Echo, Cardiac Doppler and Color Doppler  Indications:    Ischemic cardiomyopathy [I25.5 (ICD-10-CM)]  History:        Patient has prior history of Echocardiogram examinations, most recent 06/23/2020. CHF, CAD; Risk Factors:Dyslipidemia and Hypertension.  Sonographer:    Margreta Journey RDCS Referring Phys: Rito Ehrlich [facility]   Sonographer Comments: Suboptimal subcostal window. IMPRESSIONS   1. Left ventricular ejection fraction, by estimation, is 20 to 25%. The left ventricle has severely decreased function. The left ventricle demonstrates  regional wall motion abnormalities (see scoring diagram/findings for description). The left ventricular internal cavity size was mildly dilated. Left ventricular diastolic parameters are indeterminate. There is severe hypokinesis of the left ventricular, entire inferior wall, inferoseptal wall, anteroseptal wall and septal wall. 2. Right ventricular systolic function is normal. The right ventricular size is normal. There is normal pulmonary artery systolic pressure. 3. The mitral valve is normal in structure. Moderate mitral valve regurgitation. No evidence of mitral stenosis. 4. The aortic valve is normal in structure. Aortic valve regurgitation is not visualized. No aortic stenosis is present. 5. The inferior vena cava is normal in size with greater than 50% respiratory variability, suggesting right atrial pressure of 3 mmHg.  FINDINGS Left Ventricle: Left ventricular ejection fraction, by estimation, is 20 to 25%. The left ventricle has severely decreased function. The left ventricle demonstrates regional wall motion abnormalities. Severe hypokinesis of the left ventricular, entire inferior wall, inferoseptal  wall, anteroseptal wall and septal wall. The left ventricular internal cavity size was mildly dilated. There is no left ventricular hypertrophy. Left ventricular diastolic parameters are indeterminate.   LV Wall Scoring: The entire septum, entire inferior wall, posterior wall, and apex are akinetic.  Right Ventricle: The right ventricular size is normal. No increase in right ventricular wall thickness. Right ventricular systolic function is normal. There is normal pulmonary artery systolic pressure. The tricuspid regurgitant velocity is 2.73 m/s, and with an assumed right atrial pressure of 3 mmHg, the estimated right ventricular systolic pressure is 32.8 mmHg.  Left Atrium: Left atrial size was normal in size.  Right Atrium: Right atrial size was normal in size.  Pericardium: There is  no evidence of pericardial effusion.  Mitral Valve: The mitral valve is normal in structure. Moderate mitral valve regurgitation. No evidence of mitral valve stenosis.  Tricuspid Valve: The tricuspid valve is normal in structure. Tricuspid valve regurgitation is mild . No evidence of tricuspid stenosis.  Aortic Valve: The aortic valve is normal in structure. Aortic valve regurgitation is not visualized. No aortic stenosis is present.  Pulmonic Valve: The pulmonic valve was normal in structure. Pulmonic valve regurgitation is not visualized. No evidence of pulmonic stenosis.  Aorta: The aortic root is normal in size and structure.  Venous: The inferior vena cava is normal in size with greater than 50% respiratory variability, suggesting right atrial pressure of 3 mmHg.  IAS/Shunts: No atrial level shunt detected by color flow Doppler.  Additional Comments: A device lead is visualized in the right ventricle.   LEFT VENTRICLE PLAX 2D LVIDd:         6.10 cm      Diastology LVIDs:         5.40 cm      LV e' medial:    3.70 cm/s LV PW:         0.90 cm      LV E/e' medial:  32.4 LV IVS:        1.00 cm      LV e' lateral:   9.14 cm/s LVOT diam:     2.10 cm      LV E/e' lateral: 13.1 LV SV:         41 LV SV Index:   24 LVOT Area:     3.46 cm  LV Volumes (MOD) LV vol d, MOD A2C: 230.0 ml LV vol d, MOD A4C: 220.0 ml LV vol s, MOD A2C: 149.0 ml LV vol s, MOD A4C: 174.0 ml LV SV MOD A2C:     81.0 ml LV SV MOD A4C:     220.0 ml LV SV MOD BP:      70.1 ml  RIGHT VENTRICLE            IVC RV S prime:     8.16 cm/s  IVC diam: 2.10 cm TAPSE (M-mode): 1.2 cm  LEFT ATRIUM         Index LA diam:    3.60 cm 2.10 cm/m AORTIC VALVE LVOT Vmax:   51.50 cm/s LVOT Vmean:  33.900 cm/s LVOT VTI:    0.119 m  AORTA Ao Root diam: 3.70 cm Ao Asc diam:  3.10 cm  MITRAL VALVE                TRICUSPID VALVE MV Area (PHT): 5.66 cm     TR Peak grad:   29.8 mmHg MV Decel Time: 134 msec     TR Vmax:  273.00 cm/s MR Peak grad: 73.3 mmHg MR Vmax:      428.00 cm/s   SHUNTS MV E velocity: 120.00 cm/s  Systemic VTI:  0.12 m Systemic Diam: 2.10 cm  Gypsy Balsam MD Electronically signed by Gypsy Balsam MD Signature Date/Time: 04/17/2022/3:48:57 PM    Final             Risk Assessment/Calculations:             Physical Exam:   VS:  BP 128/80 (BP Location: Right Arm, Patient Position: Sitting, Cuff Size: Normal)   Pulse 87   Ht 5\' 8"  (1.727 m)   Wt 128 lb 9.6 oz (58.3 kg)   SpO2 95%   BMI 19.55 kg/m    Wt Readings from Last 3 Encounters:  01/13/23 128 lb 9.6 oz (58.3 kg)  11/18/22 132 lb (59.9 kg)  11/05/22 131 lb (59.4 kg)    GEN: ill appearing, but no acute distress  NECK: No JVD; No carotid bruits CARDIAC: RRR, no murmurs, rubs, gallops RESPIRATORY:  Clear to auscultation without rales, wheezing or rhonchi  ABDOMEN: Soft, non-tender, non-distended EXTREMITIES: trace pedal edema at ankle; No deformity   ASSESSMENT AND PLAN: .   CAD - s/p CABG x 5~20 years ago, Stable with no anginal symptoms. No indication for ischemic evaluation.  Continue aspirin 81 mg daily, continue Zetia 10 mg daily, continue metoprolol 25 mg daily, continue Crestor 10 mg daily. Pedal edema - trace pedal edema around ankle, much improved. Discussed PRN diuretic, although this may further dehydrate him and for now they do not want this. Discussed compression devices and elevation.  Ischemic cardiomyopathy/HFrEF-most recent echo in 2023 revealed an EF 20 to 25%, s/p biventricular ICD.  NYHA class II, euvolemic.  Currently on Jardiance for DM 25 mg daily, continue metoprolol 25 mg daily, continue Entresto 24-26 mg twice daily.  Further GDMT is prohibited secondary to propensity to falls and hypotension. Sick sinus syndrome/PPM - followed by EP, underwent CRT upgrade in 2019, previous discussions surrounding turning off the defibrillator however he has decided to keep it turned on for now.   HLD - LDL   most recent LDL was slightly elevated at 86, being managed by his PCP.      Dispo: Keep recall for f/u appt in September with Dr. Dulce Sellar.   Signed, Flossie Dibble, NP

## 2023-01-12 DIAGNOSIS — I110 Hypertensive heart disease with heart failure: Secondary | ICD-10-CM | POA: Diagnosis not present

## 2023-01-12 DIAGNOSIS — I5022 Chronic systolic (congestive) heart failure: Secondary | ICD-10-CM | POA: Diagnosis not present

## 2023-01-12 DIAGNOSIS — E11319 Type 2 diabetes mellitus with unspecified diabetic retinopathy without macular edema: Secondary | ICD-10-CM | POA: Diagnosis not present

## 2023-01-12 DIAGNOSIS — E46 Unspecified protein-calorie malnutrition: Secondary | ICD-10-CM | POA: Diagnosis not present

## 2023-01-12 DIAGNOSIS — L89510 Pressure ulcer of right ankle, unstageable: Secondary | ICD-10-CM | POA: Diagnosis not present

## 2023-01-12 DIAGNOSIS — L89150 Pressure ulcer of sacral region, unstageable: Secondary | ICD-10-CM | POA: Diagnosis not present

## 2023-01-13 ENCOUNTER — Encounter: Payer: Self-pay | Admitting: Cardiology

## 2023-01-13 ENCOUNTER — Ambulatory Visit: Payer: Medicare Other | Attending: Cardiology | Admitting: Cardiology

## 2023-01-13 VITALS — BP 128/80 | HR 87 | Ht 68.0 in | Wt 128.6 lb

## 2023-01-13 DIAGNOSIS — I495 Sick sinus syndrome: Secondary | ICD-10-CM | POA: Diagnosis present

## 2023-01-13 DIAGNOSIS — E782 Mixed hyperlipidemia: Secondary | ICD-10-CM

## 2023-01-13 DIAGNOSIS — I10 Essential (primary) hypertension: Secondary | ICD-10-CM | POA: Diagnosis present

## 2023-01-13 DIAGNOSIS — I2510 Atherosclerotic heart disease of native coronary artery without angina pectoris: Secondary | ICD-10-CM

## 2023-01-13 DIAGNOSIS — Z950 Presence of cardiac pacemaker: Secondary | ICD-10-CM

## 2023-01-13 DIAGNOSIS — I5020 Unspecified systolic (congestive) heart failure: Secondary | ICD-10-CM | POA: Diagnosis present

## 2023-01-13 NOTE — Patient Instructions (Signed)
Medication Instructions:  Your physician recommends that you continue on your current medications as directed. Please refer to the Current Medication list given to you today.  *If you need a refill on your cardiac medications before your next appointment, please call your pharmacy*   Lab Work: NONE If you have labs (blood work) drawn today and your tests are completely normal, you will receive your results only by: MyChart Message (if you have MyChart) OR A paper copy in the mail If you have any lab test that is abnormal or we need to change your treatment, we will call you to review the results.   Testing/Procedures: NONE   Follow-Up: At [facility], you and your health needs are our priority.  As part of our continuing mission to provide you with exceptional heart care, we have created designated Provider Care Teams.  These Care Teams include your primary Cardiologist (physician) and Advanced Practice Providers (APPs -  Physician Assistants and Nurse Practitioners) who all work together to provide you with the care you need, when you need it.  We recommend signing up for the patient portal called "MyChart".  Sign up information is provided on this After Visit Summary.  MyChart is used to connect with patients for Virtual Visits (Telemedicine).  Patients are able to view lab/test results, encounter notes, upcoming appointments, etc.  Non-urgent messages can be sent to your provider as well.   To learn more about what you can do with MyChart, go to [email or web address].    Your next appointment:   2 month(s)  Provider:   Brian Munley, MD    Other Instructions   

## 2023-01-14 DIAGNOSIS — R339 Retention of urine, unspecified: Secondary | ICD-10-CM | POA: Diagnosis not present

## 2023-01-14 DIAGNOSIS — R351 Nocturia: Secondary | ICD-10-CM | POA: Diagnosis not present

## 2023-01-14 DIAGNOSIS — L89510 Pressure ulcer of right ankle, unstageable: Secondary | ICD-10-CM | POA: Diagnosis not present

## 2023-01-14 DIAGNOSIS — N401 Enlarged prostate with lower urinary tract symptoms: Secondary | ICD-10-CM | POA: Diagnosis not present

## 2023-01-14 DIAGNOSIS — E46 Unspecified protein-calorie malnutrition: Secondary | ICD-10-CM | POA: Diagnosis not present

## 2023-01-14 DIAGNOSIS — L89150 Pressure ulcer of sacral region, unstageable: Secondary | ICD-10-CM | POA: Diagnosis not present

## 2023-01-14 DIAGNOSIS — N201 Calculus of ureter: Secondary | ICD-10-CM | POA: Diagnosis not present

## 2023-01-14 DIAGNOSIS — E11319 Type 2 diabetes mellitus with unspecified diabetic retinopathy without macular edema: Secondary | ICD-10-CM | POA: Diagnosis not present

## 2023-01-14 DIAGNOSIS — I5022 Chronic systolic (congestive) heart failure: Secondary | ICD-10-CM | POA: Diagnosis not present

## 2023-01-14 DIAGNOSIS — N200 Calculus of kidney: Secondary | ICD-10-CM | POA: Diagnosis not present

## 2023-01-14 DIAGNOSIS — I110 Hypertensive heart disease with heart failure: Secondary | ICD-10-CM | POA: Diagnosis not present

## 2023-01-15 DIAGNOSIS — L89150 Pressure ulcer of sacral region, unstageable: Secondary | ICD-10-CM | POA: Diagnosis not present

## 2023-01-15 DIAGNOSIS — I110 Hypertensive heart disease with heart failure: Secondary | ICD-10-CM | POA: Diagnosis not present

## 2023-01-15 DIAGNOSIS — I5022 Chronic systolic (congestive) heart failure: Secondary | ICD-10-CM | POA: Diagnosis not present

## 2023-01-15 DIAGNOSIS — E11319 Type 2 diabetes mellitus with unspecified diabetic retinopathy without macular edema: Secondary | ICD-10-CM | POA: Diagnosis not present

## 2023-01-15 DIAGNOSIS — E46 Unspecified protein-calorie malnutrition: Secondary | ICD-10-CM | POA: Diagnosis not present

## 2023-01-15 DIAGNOSIS — L89510 Pressure ulcer of right ankle, unstageable: Secondary | ICD-10-CM | POA: Diagnosis not present

## 2023-01-16 ENCOUNTER — Encounter: Payer: Self-pay | Admitting: Internal Medicine

## 2023-01-18 DIAGNOSIS — M519 Unspecified thoracic, thoracolumbar and lumbosacral intervertebral disc disorder: Secondary | ICD-10-CM | POA: Diagnosis not present

## 2023-01-18 DIAGNOSIS — E785 Hyperlipidemia, unspecified: Secondary | ICD-10-CM | POA: Diagnosis not present

## 2023-01-18 DIAGNOSIS — I2510 Atherosclerotic heart disease of native coronary artery without angina pectoris: Secondary | ICD-10-CM | POA: Diagnosis not present

## 2023-01-18 DIAGNOSIS — E11319 Type 2 diabetes mellitus with unspecified diabetic retinopathy without macular edema: Secondary | ICD-10-CM | POA: Diagnosis not present

## 2023-01-18 DIAGNOSIS — L89510 Pressure ulcer of right ankle, unstageable: Secondary | ICD-10-CM | POA: Diagnosis not present

## 2023-01-18 DIAGNOSIS — E860 Dehydration: Secondary | ICD-10-CM | POA: Diagnosis not present

## 2023-01-18 DIAGNOSIS — Z7982 Long term (current) use of aspirin: Secondary | ICD-10-CM | POA: Diagnosis not present

## 2023-01-18 DIAGNOSIS — Z95810 Presence of automatic (implantable) cardiac defibrillator: Secondary | ICD-10-CM | POA: Diagnosis not present

## 2023-01-18 DIAGNOSIS — M1990 Unspecified osteoarthritis, unspecified site: Secondary | ICD-10-CM | POA: Diagnosis not present

## 2023-01-18 DIAGNOSIS — E1169 Type 2 diabetes mellitus with other specified complication: Secondary | ICD-10-CM | POA: Diagnosis not present

## 2023-01-18 DIAGNOSIS — Z951 Presence of aortocoronary bypass graft: Secondary | ICD-10-CM | POA: Diagnosis not present

## 2023-01-18 DIAGNOSIS — G20B2 Parkinson's disease with dyskinesia, with fluctuations: Secondary | ICD-10-CM | POA: Diagnosis not present

## 2023-01-18 DIAGNOSIS — Z7984 Long term (current) use of oral hypoglycemic drugs: Secondary | ICD-10-CM | POA: Diagnosis not present

## 2023-01-18 DIAGNOSIS — I110 Hypertensive heart disease with heart failure: Secondary | ICD-10-CM | POA: Diagnosis not present

## 2023-01-18 DIAGNOSIS — N400 Enlarged prostate without lower urinary tract symptoms: Secondary | ICD-10-CM | POA: Diagnosis not present

## 2023-01-18 DIAGNOSIS — E46 Unspecified protein-calorie malnutrition: Secondary | ICD-10-CM | POA: Diagnosis not present

## 2023-01-18 DIAGNOSIS — Z794 Long term (current) use of insulin: Secondary | ICD-10-CM | POA: Diagnosis not present

## 2023-01-18 DIAGNOSIS — I442 Atrioventricular block, complete: Secondary | ICD-10-CM | POA: Diagnosis not present

## 2023-01-18 DIAGNOSIS — G8929 Other chronic pain: Secondary | ICD-10-CM | POA: Diagnosis not present

## 2023-01-18 DIAGNOSIS — I5022 Chronic systolic (congestive) heart failure: Secondary | ICD-10-CM | POA: Diagnosis not present

## 2023-01-18 DIAGNOSIS — L89150 Pressure ulcer of sacral region, unstageable: Secondary | ICD-10-CM | POA: Diagnosis not present

## 2023-01-18 DIAGNOSIS — I255 Ischemic cardiomyopathy: Secondary | ICD-10-CM | POA: Diagnosis not present

## 2023-01-18 DIAGNOSIS — I700 Atherosclerosis of aorta: Secondary | ICD-10-CM | POA: Diagnosis not present

## 2023-01-18 DIAGNOSIS — Z556 Problems related to health literacy: Secondary | ICD-10-CM | POA: Diagnosis not present

## 2023-01-19 DIAGNOSIS — I5022 Chronic systolic (congestive) heart failure: Secondary | ICD-10-CM | POA: Diagnosis not present

## 2023-01-19 DIAGNOSIS — L89510 Pressure ulcer of right ankle, unstageable: Secondary | ICD-10-CM | POA: Diagnosis not present

## 2023-01-19 DIAGNOSIS — E46 Unspecified protein-calorie malnutrition: Secondary | ICD-10-CM | POA: Diagnosis not present

## 2023-01-19 DIAGNOSIS — L89150 Pressure ulcer of sacral region, unstageable: Secondary | ICD-10-CM | POA: Diagnosis not present

## 2023-01-19 DIAGNOSIS — E11319 Type 2 diabetes mellitus with unspecified diabetic retinopathy without macular edema: Secondary | ICD-10-CM | POA: Diagnosis not present

## 2023-01-19 DIAGNOSIS — I110 Hypertensive heart disease with heart failure: Secondary | ICD-10-CM | POA: Diagnosis not present

## 2023-01-20 DIAGNOSIS — L89510 Pressure ulcer of right ankle, unstageable: Secondary | ICD-10-CM | POA: Diagnosis not present

## 2023-01-20 DIAGNOSIS — E46 Unspecified protein-calorie malnutrition: Secondary | ICD-10-CM | POA: Diagnosis not present

## 2023-01-20 DIAGNOSIS — L89150 Pressure ulcer of sacral region, unstageable: Secondary | ICD-10-CM | POA: Diagnosis not present

## 2023-01-20 DIAGNOSIS — I110 Hypertensive heart disease with heart failure: Secondary | ICD-10-CM | POA: Diagnosis not present

## 2023-01-20 DIAGNOSIS — E11319 Type 2 diabetes mellitus with unspecified diabetic retinopathy without macular edema: Secondary | ICD-10-CM | POA: Diagnosis not present

## 2023-01-20 DIAGNOSIS — I5022 Chronic systolic (congestive) heart failure: Secondary | ICD-10-CM | POA: Diagnosis not present

## 2023-01-21 ENCOUNTER — Telehealth: Payer: Self-pay

## 2023-01-21 DIAGNOSIS — E46 Unspecified protein-calorie malnutrition: Secondary | ICD-10-CM | POA: Diagnosis not present

## 2023-01-21 DIAGNOSIS — L89510 Pressure ulcer of right ankle, unstageable: Secondary | ICD-10-CM | POA: Diagnosis not present

## 2023-01-21 DIAGNOSIS — I110 Hypertensive heart disease with heart failure: Secondary | ICD-10-CM | POA: Diagnosis not present

## 2023-01-21 DIAGNOSIS — I5022 Chronic systolic (congestive) heart failure: Secondary | ICD-10-CM | POA: Diagnosis not present

## 2023-01-21 DIAGNOSIS — E11319 Type 2 diabetes mellitus with unspecified diabetic retinopathy without macular edema: Secondary | ICD-10-CM | POA: Diagnosis not present

## 2023-01-21 DIAGNOSIS — L89150 Pressure ulcer of sacral region, unstageable: Secondary | ICD-10-CM | POA: Diagnosis not present

## 2023-01-21 NOTE — Telephone Encounter (Signed)
Patients wife aware patient assistance for lantus is here at our office to be picked. Order [id number] 2 boxes.

## 2023-01-23 DIAGNOSIS — L89150 Pressure ulcer of sacral region, unstageable: Secondary | ICD-10-CM | POA: Diagnosis not present

## 2023-01-23 DIAGNOSIS — I110 Hypertensive heart disease with heart failure: Secondary | ICD-10-CM | POA: Diagnosis not present

## 2023-01-23 DIAGNOSIS — L89510 Pressure ulcer of right ankle, unstageable: Secondary | ICD-10-CM | POA: Diagnosis not present

## 2023-01-23 DIAGNOSIS — E11319 Type 2 diabetes mellitus with unspecified diabetic retinopathy without macular edema: Secondary | ICD-10-CM | POA: Diagnosis not present

## 2023-01-23 DIAGNOSIS — I5022 Chronic systolic (congestive) heart failure: Secondary | ICD-10-CM | POA: Diagnosis not present

## 2023-01-23 DIAGNOSIS — E46 Unspecified protein-calorie malnutrition: Secondary | ICD-10-CM | POA: Diagnosis not present

## 2023-01-26 ENCOUNTER — Ambulatory Visit: Payer: Medicare Other | Attending: Cardiology

## 2023-01-26 DIAGNOSIS — I255 Ischemic cardiomyopathy: Secondary | ICD-10-CM

## 2023-01-26 LAB — CUP PACEART INCLINIC DEVICE CHECK
Date Time Interrogation Session: 20240729101253
Implantable Lead Connection Status: 753985
Implantable Lead Connection Status: 753985
Implantable Lead Connection Status: 753985
Implantable Lead Implant Date: 20111213
Implantable Lead Implant Date: 20191230
Implantable Lead Implant Date: 20191230
Implantable Lead Location: 753858
Implantable Lead Location: 753859
Implantable Lead Location: 753860
Implantable Lead Model: 5076
Implantable Pulse Generator Implant Date: 20191230

## 2023-01-26 NOTE — Patient Instructions (Signed)
Follow up per recall. 

## 2023-01-26 NOTE — Progress Notes (Signed)
Pt seen in clinic to disable HV therapies per verbal order from Dr. Judie Bonus. Graciela Husbands.

## 2023-01-28 DIAGNOSIS — E46 Unspecified protein-calorie malnutrition: Secondary | ICD-10-CM | POA: Diagnosis not present

## 2023-01-28 DIAGNOSIS — I110 Hypertensive heart disease with heart failure: Secondary | ICD-10-CM | POA: Diagnosis not present

## 2023-01-28 DIAGNOSIS — L89150 Pressure ulcer of sacral region, unstageable: Secondary | ICD-10-CM | POA: Diagnosis not present

## 2023-01-28 DIAGNOSIS — E11319 Type 2 diabetes mellitus with unspecified diabetic retinopathy without macular edema: Secondary | ICD-10-CM | POA: Diagnosis not present

## 2023-01-28 DIAGNOSIS — I5022 Chronic systolic (congestive) heart failure: Secondary | ICD-10-CM | POA: Diagnosis not present

## 2023-01-28 DIAGNOSIS — L89510 Pressure ulcer of right ankle, unstageable: Secondary | ICD-10-CM | POA: Diagnosis not present

## 2023-01-28 NOTE — Telephone Encounter (Signed)
Lantus given to wife. Form signed and put in Kim's box.

## 2023-01-28 NOTE — Progress Notes (Signed)
Remote ICD transmission.   

## 2023-01-29 DIAGNOSIS — L89150 Pressure ulcer of sacral region, unstageable: Secondary | ICD-10-CM | POA: Diagnosis not present

## 2023-01-29 DIAGNOSIS — L89510 Pressure ulcer of right ankle, unstageable: Secondary | ICD-10-CM | POA: Diagnosis not present

## 2023-01-29 DIAGNOSIS — I5022 Chronic systolic (congestive) heart failure: Secondary | ICD-10-CM | POA: Diagnosis not present

## 2023-01-29 DIAGNOSIS — E46 Unspecified protein-calorie malnutrition: Secondary | ICD-10-CM | POA: Diagnosis not present

## 2023-01-29 DIAGNOSIS — I110 Hypertensive heart disease with heart failure: Secondary | ICD-10-CM | POA: Diagnosis not present

## 2023-01-29 DIAGNOSIS — E11319 Type 2 diabetes mellitus with unspecified diabetic retinopathy without macular edema: Secondary | ICD-10-CM | POA: Diagnosis not present

## 2023-02-02 DIAGNOSIS — E11319 Type 2 diabetes mellitus with unspecified diabetic retinopathy without macular edema: Secondary | ICD-10-CM | POA: Diagnosis not present

## 2023-02-02 DIAGNOSIS — L89510 Pressure ulcer of right ankle, unstageable: Secondary | ICD-10-CM | POA: Diagnosis not present

## 2023-02-02 DIAGNOSIS — L89150 Pressure ulcer of sacral region, unstageable: Secondary | ICD-10-CM | POA: Diagnosis not present

## 2023-02-02 DIAGNOSIS — I5022 Chronic systolic (congestive) heart failure: Secondary | ICD-10-CM | POA: Diagnosis not present

## 2023-02-02 DIAGNOSIS — E46 Unspecified protein-calorie malnutrition: Secondary | ICD-10-CM | POA: Diagnosis not present

## 2023-02-02 DIAGNOSIS — I110 Hypertensive heart disease with heart failure: Secondary | ICD-10-CM | POA: Diagnosis not present

## 2023-02-03 DIAGNOSIS — N201 Calculus of ureter: Secondary | ICD-10-CM | POA: Diagnosis not present

## 2023-02-03 DIAGNOSIS — R339 Retention of urine, unspecified: Secondary | ICD-10-CM | POA: Diagnosis not present

## 2023-02-03 DIAGNOSIS — N401 Enlarged prostate with lower urinary tract symptoms: Secondary | ICD-10-CM | POA: Diagnosis not present

## 2023-02-03 DIAGNOSIS — N200 Calculus of kidney: Secondary | ICD-10-CM | POA: Diagnosis not present

## 2023-02-03 DIAGNOSIS — R351 Nocturia: Secondary | ICD-10-CM | POA: Diagnosis not present

## 2023-02-04 DIAGNOSIS — E46 Unspecified protein-calorie malnutrition: Secondary | ICD-10-CM | POA: Diagnosis not present

## 2023-02-04 DIAGNOSIS — I110 Hypertensive heart disease with heart failure: Secondary | ICD-10-CM | POA: Diagnosis not present

## 2023-02-04 DIAGNOSIS — L89510 Pressure ulcer of right ankle, unstageable: Secondary | ICD-10-CM | POA: Diagnosis not present

## 2023-02-04 DIAGNOSIS — I5022 Chronic systolic (congestive) heart failure: Secondary | ICD-10-CM | POA: Diagnosis not present

## 2023-02-04 DIAGNOSIS — E11319 Type 2 diabetes mellitus with unspecified diabetic retinopathy without macular edema: Secondary | ICD-10-CM | POA: Diagnosis not present

## 2023-02-04 DIAGNOSIS — L89150 Pressure ulcer of sacral region, unstageable: Secondary | ICD-10-CM | POA: Diagnosis not present

## 2023-02-06 DIAGNOSIS — L89150 Pressure ulcer of sacral region, unstageable: Secondary | ICD-10-CM | POA: Diagnosis not present

## 2023-02-06 DIAGNOSIS — E11319 Type 2 diabetes mellitus with unspecified diabetic retinopathy without macular edema: Secondary | ICD-10-CM | POA: Diagnosis not present

## 2023-02-06 DIAGNOSIS — I5022 Chronic systolic (congestive) heart failure: Secondary | ICD-10-CM | POA: Diagnosis not present

## 2023-02-06 DIAGNOSIS — L89510 Pressure ulcer of right ankle, unstageable: Secondary | ICD-10-CM | POA: Diagnosis not present

## 2023-02-06 DIAGNOSIS — I110 Hypertensive heart disease with heart failure: Secondary | ICD-10-CM | POA: Diagnosis not present

## 2023-02-06 DIAGNOSIS — E46 Unspecified protein-calorie malnutrition: Secondary | ICD-10-CM | POA: Diagnosis not present

## 2023-02-07 ENCOUNTER — Other Ambulatory Visit: Payer: Self-pay | Admitting: Family Medicine

## 2023-02-07 DIAGNOSIS — N201 Calculus of ureter: Secondary | ICD-10-CM | POA: Diagnosis not present

## 2023-02-07 DIAGNOSIS — N1330 Unspecified hydronephrosis: Secondary | ICD-10-CM | POA: Diagnosis not present

## 2023-02-07 DIAGNOSIS — R339 Retention of urine, unspecified: Secondary | ICD-10-CM | POA: Diagnosis not present

## 2023-02-07 DIAGNOSIS — N200 Calculus of kidney: Secondary | ICD-10-CM | POA: Diagnosis not present

## 2023-02-09 ENCOUNTER — Telehealth: Payer: Self-pay | Admitting: Family Medicine

## 2023-02-09 NOTE — Telephone Encounter (Signed)
[facility] 01/29/23

## 2023-02-10 DIAGNOSIS — I110 Hypertensive heart disease with heart failure: Secondary | ICD-10-CM | POA: Diagnosis not present

## 2023-02-10 DIAGNOSIS — I5022 Chronic systolic (congestive) heart failure: Secondary | ICD-10-CM | POA: Diagnosis not present

## 2023-02-10 DIAGNOSIS — L89510 Pressure ulcer of right ankle, unstageable: Secondary | ICD-10-CM | POA: Diagnosis not present

## 2023-02-10 DIAGNOSIS — E46 Unspecified protein-calorie malnutrition: Secondary | ICD-10-CM | POA: Diagnosis not present

## 2023-02-10 DIAGNOSIS — L89150 Pressure ulcer of sacral region, unstageable: Secondary | ICD-10-CM | POA: Diagnosis not present

## 2023-02-10 DIAGNOSIS — E11319 Type 2 diabetes mellitus with unspecified diabetic retinopathy without macular edema: Secondary | ICD-10-CM | POA: Diagnosis not present

## 2023-02-11 DIAGNOSIS — L89510 Pressure ulcer of right ankle, unstageable: Secondary | ICD-10-CM | POA: Diagnosis not present

## 2023-02-11 DIAGNOSIS — E46 Unspecified protein-calorie malnutrition: Secondary | ICD-10-CM | POA: Diagnosis not present

## 2023-02-11 DIAGNOSIS — E11319 Type 2 diabetes mellitus with unspecified diabetic retinopathy without macular edema: Secondary | ICD-10-CM | POA: Diagnosis not present

## 2023-02-11 DIAGNOSIS — L89150 Pressure ulcer of sacral region, unstageable: Secondary | ICD-10-CM | POA: Diagnosis not present

## 2023-02-11 DIAGNOSIS — I110 Hypertensive heart disease with heart failure: Secondary | ICD-10-CM | POA: Diagnosis not present

## 2023-02-11 DIAGNOSIS — I5022 Chronic systolic (congestive) heart failure: Secondary | ICD-10-CM | POA: Diagnosis not present

## 2023-02-12 ENCOUNTER — Telehealth: Payer: Self-pay

## 2023-02-12 DIAGNOSIS — E46 Unspecified protein-calorie malnutrition: Secondary | ICD-10-CM | POA: Diagnosis not present

## 2023-02-12 DIAGNOSIS — I110 Hypertensive heart disease with heart failure: Secondary | ICD-10-CM | POA: Diagnosis not present

## 2023-02-12 DIAGNOSIS — I5022 Chronic systolic (congestive) heart failure: Secondary | ICD-10-CM | POA: Diagnosis not present

## 2023-02-12 DIAGNOSIS — E11319 Type 2 diabetes mellitus with unspecified diabetic retinopathy without macular edema: Secondary | ICD-10-CM | POA: Diagnosis not present

## 2023-02-12 DIAGNOSIS — L89150 Pressure ulcer of sacral region, unstageable: Secondary | ICD-10-CM | POA: Diagnosis not present

## 2023-02-12 DIAGNOSIS — L89510 Pressure ulcer of right ankle, unstageable: Secondary | ICD-10-CM | POA: Diagnosis not present

## 2023-02-12 NOTE — Telephone Encounter (Signed)
Joni Reining with [facility] called to let us know that Jeffrey Parsons has been discharged. He had care for 4 months with therapy and nursing. The patient has a pressure wound stage 2 on his bottom and a wound on his outer right ankle. The wife did not want the patient to be referred to wound care at this time. She was advised to reach out to our office if something changes.

## 2023-02-13 NOTE — Telephone Encounter (Signed)
Please call the patient's wife and make sure he is doing some sort of continued wound care that was established by home health care. Dr. Sedalia Muta

## 2023-02-16 DIAGNOSIS — Z888 Allergy status to other drugs, medicaments and biological substances status: Secondary | ICD-10-CM | POA: Diagnosis not present

## 2023-02-16 DIAGNOSIS — R2681 Unsteadiness on feet: Secondary | ICD-10-CM | POA: Diagnosis not present

## 2023-02-16 DIAGNOSIS — I110 Hypertensive heart disease with heart failure: Secondary | ICD-10-CM | POA: Diagnosis not present

## 2023-02-16 DIAGNOSIS — G839 Paralytic syndrome, unspecified: Secondary | ICD-10-CM | POA: Diagnosis not present

## 2023-02-16 DIAGNOSIS — F339 Major depressive disorder, recurrent, unspecified: Secondary | ICD-10-CM | POA: Diagnosis not present

## 2023-02-16 DIAGNOSIS — N400 Enlarged prostate without lower urinary tract symptoms: Secondary | ICD-10-CM | POA: Diagnosis not present

## 2023-02-16 DIAGNOSIS — Z66 Do not resuscitate: Secondary | ICD-10-CM | POA: Diagnosis not present

## 2023-02-16 DIAGNOSIS — I2510 Atherosclerotic heart disease of native coronary artery without angina pectoris: Secondary | ICD-10-CM | POA: Diagnosis not present

## 2023-02-16 DIAGNOSIS — R739 Hyperglycemia, unspecified: Secondary | ICD-10-CM | POA: Diagnosis not present

## 2023-02-16 DIAGNOSIS — M6289 Other specified disorders of muscle: Secondary | ICD-10-CM | POA: Diagnosis not present

## 2023-02-16 DIAGNOSIS — Z95810 Presence of automatic (implantable) cardiac defibrillator: Secondary | ICD-10-CM | POA: Diagnosis not present

## 2023-02-16 DIAGNOSIS — E119 Type 2 diabetes mellitus without complications: Secondary | ICD-10-CM | POA: Diagnosis not present

## 2023-02-16 DIAGNOSIS — R9431 Abnormal electrocardiogram [ECG] [EKG]: Secondary | ICD-10-CM | POA: Diagnosis not present

## 2023-02-16 DIAGNOSIS — Z515 Encounter for palliative care: Secondary | ICD-10-CM | POA: Diagnosis not present

## 2023-02-16 DIAGNOSIS — R079 Chest pain, unspecified: Secondary | ICD-10-CM | POA: Diagnosis not present

## 2023-02-16 DIAGNOSIS — I480 Paroxysmal atrial fibrillation: Secondary | ICD-10-CM | POA: Diagnosis not present

## 2023-02-16 DIAGNOSIS — Z79899 Other long term (current) drug therapy: Secondary | ICD-10-CM | POA: Diagnosis not present

## 2023-02-16 DIAGNOSIS — Z741 Need for assistance with personal care: Secondary | ICD-10-CM | POA: Diagnosis not present

## 2023-02-16 DIAGNOSIS — I499 Cardiac arrhythmia, unspecified: Secondary | ICD-10-CM | POA: Diagnosis not present

## 2023-02-16 DIAGNOSIS — I255 Ischemic cardiomyopathy: Secondary | ICD-10-CM | POA: Diagnosis not present

## 2023-02-16 DIAGNOSIS — R0689 Other abnormalities of breathing: Secondary | ICD-10-CM | POA: Diagnosis not present

## 2023-02-16 DIAGNOSIS — Z7982 Long term (current) use of aspirin: Secondary | ICD-10-CM | POA: Diagnosis not present

## 2023-02-16 DIAGNOSIS — E782 Mixed hyperlipidemia: Secondary | ICD-10-CM | POA: Diagnosis not present

## 2023-02-16 DIAGNOSIS — E785 Hyperlipidemia, unspecified: Secondary | ICD-10-CM | POA: Diagnosis not present

## 2023-02-16 DIAGNOSIS — I472 Ventricular tachycardia, unspecified: Secondary | ICD-10-CM | POA: Diagnosis not present

## 2023-02-16 DIAGNOSIS — I429 Cardiomyopathy, unspecified: Secondary | ICD-10-CM | POA: Diagnosis not present

## 2023-02-16 DIAGNOSIS — Z7401 Bed confinement status: Secondary | ICD-10-CM | POA: Diagnosis not present

## 2023-02-16 DIAGNOSIS — R1312 Dysphagia, oropharyngeal phase: Secondary | ICD-10-CM | POA: Diagnosis not present

## 2023-02-16 DIAGNOSIS — R000 Tachycardia, unspecified: Secondary | ICD-10-CM | POA: Diagnosis not present

## 2023-02-16 DIAGNOSIS — Z7409 Other reduced mobility: Secondary | ICD-10-CM | POA: Diagnosis not present

## 2023-02-16 DIAGNOSIS — Z882 Allergy status to sulfonamides status: Secondary | ICD-10-CM | POA: Diagnosis not present

## 2023-02-16 DIAGNOSIS — Z7984 Long term (current) use of oral hypoglycemic drugs: Secondary | ICD-10-CM | POA: Diagnosis not present

## 2023-02-16 DIAGNOSIS — I5022 Chronic systolic (congestive) heart failure: Secondary | ICD-10-CM | POA: Diagnosis not present

## 2023-02-16 DIAGNOSIS — Z880 Allergy status to penicillin: Secondary | ICD-10-CM | POA: Diagnosis not present

## 2023-02-16 DIAGNOSIS — Z951 Presence of aortocoronary bypass graft: Secondary | ICD-10-CM | POA: Diagnosis not present

## 2023-02-16 DIAGNOSIS — G20A1 Parkinson's disease without dyskinesia, without mention of fluctuations: Secondary | ICD-10-CM | POA: Diagnosis not present

## 2023-02-16 DIAGNOSIS — J45909 Unspecified asthma, uncomplicated: Secondary | ICD-10-CM | POA: Diagnosis not present

## 2023-02-16 DIAGNOSIS — E46 Unspecified protein-calorie malnutrition: Secondary | ICD-10-CM | POA: Diagnosis not present

## 2023-02-16 DIAGNOSIS — I252 Old myocardial infarction: Secondary | ICD-10-CM | POA: Diagnosis not present

## 2023-02-16 DIAGNOSIS — Z794 Long term (current) use of insulin: Secondary | ICD-10-CM | POA: Diagnosis not present

## 2023-02-16 DIAGNOSIS — M6281 Muscle weakness (generalized): Secondary | ICD-10-CM | POA: Diagnosis not present

## 2023-02-17 ENCOUNTER — Other Ambulatory Visit: Payer: Self-pay | Admitting: Cardiology

## 2023-02-17 DIAGNOSIS — I2510 Atherosclerotic heart disease of native coronary artery without angina pectoris: Secondary | ICD-10-CM | POA: Diagnosis not present

## 2023-02-17 DIAGNOSIS — I480 Paroxysmal atrial fibrillation: Secondary | ICD-10-CM | POA: Diagnosis not present

## 2023-02-17 DIAGNOSIS — I5022 Chronic systolic (congestive) heart failure: Secondary | ICD-10-CM | POA: Diagnosis not present

## 2023-02-17 DIAGNOSIS — I429 Cardiomyopathy, unspecified: Secondary | ICD-10-CM | POA: Diagnosis not present

## 2023-02-18 DIAGNOSIS — I480 Paroxysmal atrial fibrillation: Secondary | ICD-10-CM | POA: Diagnosis not present

## 2023-02-18 DIAGNOSIS — I472 Ventricular tachycardia, unspecified: Secondary | ICD-10-CM | POA: Diagnosis not present

## 2023-02-18 DIAGNOSIS — I429 Cardiomyopathy, unspecified: Secondary | ICD-10-CM | POA: Diagnosis not present

## 2023-02-18 DIAGNOSIS — I2510 Atherosclerotic heart disease of native coronary artery without angina pectoris: Secondary | ICD-10-CM | POA: Diagnosis not present

## 2023-02-18 NOTE — Telephone Encounter (Signed)
Spoke to Wife [name]) she stated that [place] doing wound care.

## 2023-02-19 DIAGNOSIS — I472 Ventricular tachycardia, unspecified: Secondary | ICD-10-CM | POA: Diagnosis not present

## 2023-02-19 DIAGNOSIS — I429 Cardiomyopathy, unspecified: Secondary | ICD-10-CM | POA: Diagnosis not present

## 2023-02-19 DIAGNOSIS — I2510 Atherosclerotic heart disease of native coronary artery without angina pectoris: Secondary | ICD-10-CM | POA: Diagnosis not present

## 2023-02-19 DIAGNOSIS — I480 Paroxysmal atrial fibrillation: Secondary | ICD-10-CM | POA: Diagnosis not present

## 2023-02-19 NOTE — Telephone Encounter (Signed)
Agree 

## 2023-02-20 DIAGNOSIS — I5022 Chronic systolic (congestive) heart failure: Secondary | ICD-10-CM | POA: Diagnosis not present

## 2023-02-20 DIAGNOSIS — I429 Cardiomyopathy, unspecified: Secondary | ICD-10-CM | POA: Diagnosis not present

## 2023-02-20 DIAGNOSIS — I2510 Atherosclerotic heart disease of native coronary artery without angina pectoris: Secondary | ICD-10-CM | POA: Diagnosis not present

## 2023-02-20 DIAGNOSIS — I472 Ventricular tachycardia, unspecified: Secondary | ICD-10-CM | POA: Diagnosis not present

## 2023-02-23 ENCOUNTER — Other Ambulatory Visit: Payer: Medicare Other

## 2023-02-24 DIAGNOSIS — G20A1 Parkinson's disease without dyskinesia, without mention of fluctuations: Secondary | ICD-10-CM | POA: Diagnosis not present

## 2023-02-24 DIAGNOSIS — M6281 Muscle weakness (generalized): Secondary | ICD-10-CM | POA: Diagnosis not present

## 2023-02-24 DIAGNOSIS — R1312 Dysphagia, oropharyngeal phase: Secondary | ICD-10-CM | POA: Diagnosis not present

## 2023-02-24 DIAGNOSIS — I509 Heart failure, unspecified: Secondary | ICD-10-CM | POA: Diagnosis not present

## 2023-02-24 DIAGNOSIS — N400 Enlarged prostate without lower urinary tract symptoms: Secondary | ICD-10-CM | POA: Diagnosis not present

## 2023-02-24 DIAGNOSIS — I480 Paroxysmal atrial fibrillation: Secondary | ICD-10-CM | POA: Diagnosis not present

## 2023-02-24 DIAGNOSIS — E46 Unspecified protein-calorie malnutrition: Secondary | ICD-10-CM | POA: Diagnosis not present

## 2023-02-24 DIAGNOSIS — L89154 Pressure ulcer of sacral region, stage 4: Secondary | ICD-10-CM | POA: Diagnosis not present

## 2023-02-24 DIAGNOSIS — L89526 Pressure-induced deep tissue damage of left ankle: Secondary | ICD-10-CM | POA: Diagnosis not present

## 2023-02-24 DIAGNOSIS — Z7409 Other reduced mobility: Secondary | ICD-10-CM | POA: Diagnosis not present

## 2023-02-24 DIAGNOSIS — I429 Cardiomyopathy, unspecified: Secondary | ICD-10-CM | POA: Diagnosis not present

## 2023-02-24 DIAGNOSIS — I5022 Chronic systolic (congestive) heart failure: Secondary | ICD-10-CM | POA: Diagnosis not present

## 2023-02-24 DIAGNOSIS — E782 Mixed hyperlipidemia: Secondary | ICD-10-CM | POA: Diagnosis not present

## 2023-02-24 DIAGNOSIS — Z741 Need for assistance with personal care: Secondary | ICD-10-CM | POA: Diagnosis not present

## 2023-02-24 DIAGNOSIS — M6289 Other specified disorders of muscle: Secondary | ICD-10-CM | POA: Diagnosis not present

## 2023-02-24 DIAGNOSIS — G839 Paralytic syndrome, unspecified: Secondary | ICD-10-CM | POA: Diagnosis not present

## 2023-02-24 DIAGNOSIS — F339 Major depressive disorder, recurrent, unspecified: Secondary | ICD-10-CM | POA: Diagnosis not present

## 2023-02-24 DIAGNOSIS — I110 Hypertensive heart disease with heart failure: Secondary | ICD-10-CM | POA: Diagnosis not present

## 2023-02-24 DIAGNOSIS — R2681 Unsteadiness on feet: Secondary | ICD-10-CM | POA: Diagnosis not present

## 2023-02-24 DIAGNOSIS — E119 Type 2 diabetes mellitus without complications: Secondary | ICD-10-CM | POA: Diagnosis not present

## 2023-02-24 DIAGNOSIS — I472 Ventricular tachycardia, unspecified: Secondary | ICD-10-CM | POA: Diagnosis not present

## 2023-02-24 DIAGNOSIS — G20C Parkinsonism, unspecified: Secondary | ICD-10-CM | POA: Diagnosis not present

## 2023-02-24 DIAGNOSIS — I2510 Atherosclerotic heart disease of native coronary artery without angina pectoris: Secondary | ICD-10-CM | POA: Diagnosis not present

## 2023-02-24 DIAGNOSIS — Z7401 Bed confinement status: Secondary | ICD-10-CM | POA: Diagnosis not present

## 2023-02-25 ENCOUNTER — Ambulatory Visit: Payer: Medicare Other | Admitting: Family Medicine

## 2023-02-25 DIAGNOSIS — G20C Parkinsonism, unspecified: Secondary | ICD-10-CM | POA: Diagnosis not present

## 2023-02-25 DIAGNOSIS — E119 Type 2 diabetes mellitus without complications: Secondary | ICD-10-CM | POA: Diagnosis not present

## 2023-02-25 DIAGNOSIS — I429 Cardiomyopathy, unspecified: Secondary | ICD-10-CM | POA: Diagnosis not present

## 2023-02-25 DIAGNOSIS — I509 Heart failure, unspecified: Secondary | ICD-10-CM | POA: Diagnosis not present

## 2023-02-25 DIAGNOSIS — N400 Enlarged prostate without lower urinary tract symptoms: Secondary | ICD-10-CM | POA: Diagnosis not present

## 2023-02-26 DIAGNOSIS — E46 Unspecified protein-calorie malnutrition: Secondary | ICD-10-CM | POA: Diagnosis not present

## 2023-02-26 DIAGNOSIS — E119 Type 2 diabetes mellitus without complications: Secondary | ICD-10-CM | POA: Diagnosis not present

## 2023-02-26 DIAGNOSIS — I5022 Chronic systolic (congestive) heart failure: Secondary | ICD-10-CM | POA: Diagnosis not present

## 2023-02-26 DIAGNOSIS — L89154 Pressure ulcer of sacral region, stage 4: Secondary | ICD-10-CM | POA: Diagnosis not present

## 2023-02-26 DIAGNOSIS — L89526 Pressure-induced deep tissue damage of left ankle: Secondary | ICD-10-CM | POA: Diagnosis not present

## 2023-03-01 DEATH — deceased

## 2023-03-05 ENCOUNTER — Ambulatory Visit: Payer: Medicare Other | Admitting: Podiatry

## 2023-03-25 ENCOUNTER — Ambulatory Visit: Payer: Medicare Other | Admitting: Cardiology

## 2023-07-03 ENCOUNTER — Encounter: Payer: Medicare Other | Admitting: Internal Medicine
